# Patient Record
Sex: Male | Born: 1965
Health system: Southern US, Community
[De-identification: ages and names within clinical notes are randomized; demographics above are authoritative.]

## PROBLEM LIST (undated history)

## (undated) DIAGNOSIS — M792 Neuralgia and neuritis, unspecified: Secondary | ICD-10-CM

## (undated) DIAGNOSIS — G8929 Other chronic pain: Secondary | ICD-10-CM

## (undated) DIAGNOSIS — C801 Malignant (primary) neoplasm, unspecified: Secondary | ICD-10-CM

## (undated) DIAGNOSIS — I509 Heart failure, unspecified: Secondary | ICD-10-CM

## (undated) DIAGNOSIS — I429 Cardiomyopathy, unspecified: Secondary | ICD-10-CM

## (undated) DIAGNOSIS — M5441 Lumbago with sciatica, right side: Secondary | ICD-10-CM

## (undated) DIAGNOSIS — N189 Chronic kidney disease, unspecified: Secondary | ICD-10-CM

## (undated) DIAGNOSIS — F4310 Post-traumatic stress disorder, unspecified: Secondary | ICD-10-CM

## (undated) DIAGNOSIS — J4520 Mild intermittent asthma, uncomplicated: Secondary | ICD-10-CM

## (undated) DIAGNOSIS — I2510 Atherosclerotic heart disease of native coronary artery without angina pectoris: Secondary | ICD-10-CM

## (undated) DIAGNOSIS — Z8572 Personal history of non-Hodgkin lymphomas: Secondary | ICD-10-CM

## (undated) DIAGNOSIS — Z87828 Personal history of other (healed) physical injury and trauma: Secondary | ICD-10-CM

## (undated) DIAGNOSIS — M25561 Pain in right knee: Secondary | ICD-10-CM

## (undated) DIAGNOSIS — F1911 Other psychoactive substance abuse, in remission: Secondary | ICD-10-CM

## (undated) DIAGNOSIS — K769 Liver disease, unspecified: Secondary | ICD-10-CM

## (undated) HISTORY — DX: Atherosclerotic heart disease of native coronary artery without angina pectoris: I25.10

## (undated) HISTORY — DX: Post-traumatic stress disorder, unspecified: F43.10

## (undated) HISTORY — DX: Cardiomyopathy, unspecified: I42.9

## (undated) HISTORY — DX: Personal history of non-Hodgkin lymphomas: Z85.72

## (undated) HISTORY — DX: Mild intermittent asthma, uncomplicated: J45.20

## (undated) HISTORY — DX: Personal history of other (healed) physical injury and trauma: Z87.828

## (undated) HISTORY — DX: Neuralgia and neuritis, unspecified: M79.2

## (undated) HISTORY — DX: Other chronic pain: G89.29

## (undated) HISTORY — DX: Chronic kidney disease, unspecified: N18.9

## (undated) HISTORY — DX: Lumbago with sciatica, right side: M54.41

## (undated) HISTORY — DX: Liver disease, unspecified: K76.9

## (undated) HISTORY — DX: Malignant (primary) neoplasm, unspecified: C80.1

## (undated) HISTORY — DX: Other psychoactive substance abuse, in remission: F19.11

## (undated) HISTORY — DX: Heart failure, unspecified: I50.9

## (undated) HISTORY — PX: OTHER SURGICAL HISTORY: SHX169

## (undated) HISTORY — DX: Pain in right knee: M25.561

## (undated) SURGERY — RIGHT HEART CATH
Anesthesia: LOCAL

---

## 2017-12-24 ENCOUNTER — Ambulatory Visit: Payer: Medicare (Managed Care) | Attending: Family Medicine | Admitting: Family Medicine

## 2017-12-24 ENCOUNTER — Ambulatory Visit: Payer: Medicare (Managed Care) | Attending: Family Medicine | Admitting: Licensed Clinical Social Worker

## 2017-12-24 ENCOUNTER — Telehealth: Payer: Self-pay

## 2017-12-24 ENCOUNTER — Encounter: Payer: Self-pay | Admitting: Family Medicine

## 2017-12-24 VITALS — BP 118/86 | HR 80 | Temp 98.9°F | Resp 18 | Ht 68.0 in | Wt 202.2 lb

## 2017-12-24 DIAGNOSIS — I130 Hypertensive heart and chronic kidney disease with heart failure and stage 1 through stage 4 chronic kidney disease, or unspecified chronic kidney disease: Secondary | ICD-10-CM | POA: Insufficient documentation

## 2017-12-24 DIAGNOSIS — Z5900 Homelessness unspecified: Secondary | ICD-10-CM

## 2017-12-24 DIAGNOSIS — Z7982 Long term (current) use of aspirin: Secondary | ICD-10-CM | POA: Diagnosis not present

## 2017-12-24 DIAGNOSIS — I10 Essential (primary) hypertension: Secondary | ICD-10-CM | POA: Diagnosis not present

## 2017-12-24 DIAGNOSIS — F4312 Post-traumatic stress disorder, chronic: Secondary | ICD-10-CM | POA: Diagnosis not present

## 2017-12-24 DIAGNOSIS — I509 Heart failure, unspecified: Secondary | ICD-10-CM | POA: Diagnosis not present

## 2017-12-24 DIAGNOSIS — M545 Low back pain: Secondary | ICD-10-CM

## 2017-12-24 DIAGNOSIS — J4520 Mild intermittent asthma, uncomplicated: Secondary | ICD-10-CM | POA: Diagnosis not present

## 2017-12-24 DIAGNOSIS — G629 Polyneuropathy, unspecified: Secondary | ICD-10-CM | POA: Diagnosis not present

## 2017-12-24 DIAGNOSIS — Z79899 Other long term (current) drug therapy: Secondary | ICD-10-CM | POA: Diagnosis not present

## 2017-12-24 DIAGNOSIS — F332 Major depressive disorder, recurrent severe without psychotic features: Secondary | ICD-10-CM

## 2017-12-24 DIAGNOSIS — G8929 Other chronic pain: Secondary | ICD-10-CM | POA: Diagnosis not present

## 2017-12-24 DIAGNOSIS — I25119 Atherosclerotic heart disease of native coronary artery with unspecified angina pectoris: Secondary | ICD-10-CM | POA: Diagnosis not present

## 2017-12-24 DIAGNOSIS — I428 Other cardiomyopathies: Secondary | ICD-10-CM | POA: Insufficient documentation

## 2017-12-24 DIAGNOSIS — I25110 Atherosclerotic heart disease of native coronary artery with unstable angina pectoris: Secondary | ICD-10-CM | POA: Diagnosis present

## 2017-12-24 DIAGNOSIS — N189 Chronic kidney disease, unspecified: Secondary | ICD-10-CM | POA: Insufficient documentation

## 2017-12-24 DIAGNOSIS — F419 Anxiety disorder, unspecified: Secondary | ICD-10-CM

## 2017-12-24 DIAGNOSIS — M792 Neuralgia and neuritis, unspecified: Secondary | ICD-10-CM

## 2017-12-24 MED ORDER — NITROGLYCERIN 0.4 MG SL SUBL: 0.4000 mg | SUBLINGUAL_TABLET | SUBLINGUAL | 11 refills | Status: DC | PRN

## 2017-12-24 MED ORDER — TRIAMCINOLONE ACETONIDE 0.025 % EX CREA: 1.0000 "application " | TOPICAL_CREAM | Freq: Two times a day (BID) | CUTANEOUS | 11 refills | Status: DC

## 2017-12-24 MED ORDER — POTASSIUM CHLORIDE 20 MEQ PO PACK: 20.0000 meq | PACK | Freq: Two times a day (BID) | ORAL | 6 refills | Status: DC

## 2017-12-24 MED ORDER — ATORVASTATIN CALCIUM 80 MG PO TABS: 80.0000 mg | ORAL_TABLET | Freq: Every day | ORAL | 11 refills | Status: DC

## 2017-12-24 MED ORDER — ASPIRIN 81 MG PO TABS: 81.0000 mg | ORAL_TABLET | Freq: Every day | ORAL | 11 refills | Status: DC

## 2017-12-24 MED ORDER — METOPROLOL SUCCINATE ER 25 MG PO TB24: 25.0000 mg | ORAL_TABLET | Freq: Every day | ORAL | 6 refills | Status: DC

## 2017-12-24 MED ORDER — FUROSEMIDE 80 MG PO TABS: 80.0000 mg | ORAL_TABLET | Freq: Every day | ORAL | 6 refills | Status: DC

## 2017-12-24 MED FILL — POTASSIUM CL ER 20 MEQ TAB: 20 | 30 days supply | Qty: 60 | Fill #0

## 2017-12-24 MED FILL — NITROGLYCERIN 0.4 MG TAB SL: 0.4 | 10 days supply | Qty: 25 | Fill #0

## 2017-12-24 MED FILL — ATORVASTATIN 80 MG TABLET: 80 | 30 days supply | Qty: 30 | Fill #0

## 2017-12-24 MED FILL — FUROSEMIDE 80 MG TABS: 80 | 30 days supply | Qty: 30 | Fill #0

## 2017-12-24 MED FILL — METOPROLOL SUCCINATE ER 25: 25 | 30 days supply | Qty: 30 | Fill #0
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
## 2017-12-24 NOTE — Telephone Encounter (Signed)
Patient was called, no answer. CMA lvm stating that the patients letter was ready for pick up.

## 2017-12-24 NOTE — Patient Instructions (Signed)
Asthma, Adult Asthma is a condition of the lungs in which the airways tighten and narrow. Asthma can make it hard to breathe. Asthma cannot be cured, but medicine and lifestyle changes can help control it. Asthma may be started (triggered) by:  Animal skin flakes (dander).  Dust.  Cockroaches.  Pollen.  Mold.  Smoke.  Cleaning products.  Hair sprays or aerosol sprays.  Paint fumes or strong smells.  Cold air, weather changes, and winds.  Crying or laughing hard.  Stress.  Certain medicines or drugs.  Foods, such as dried fruit, potato chips, and sparkling grape juice.  Infections or conditions (colds, flu).  Exercise.  Certain medical conditions or diseases.  Exercise or tiring activities.  Follow these instructions at home:  Take medicine as told by your doctor.  Use a peak flow meter as told by your doctor. A peak flow meter is a tool that measures how well the lungs are working.  Record and keep track of the peak flow meter's readings.  Understand and use the asthma action plan. An asthma action plan is a written plan for taking care of your asthma and treating your attacks.  To help prevent asthma attacks: ? Do not smoke. Stay away from secondhand smoke. ? Change your heating and air conditioning filter often. ? Limit your use of fireplaces and wood stoves. ? Get rid of pests (such as roaches and mice) and their droppings. ? Throw away plants if you see mold on them. ? Clean your floors. Dust regularly. Use cleaning products that do not smell. ? Have someone vacuum when you are not home. Use a vacuum cleaner with a HEPA filter if possible. ? Replace carpet with wood, tile, or vinyl flooring. Carpet can trap animal skin flakes and dust. ? Use allergy-proof pillows, mattress covers, and box spring covers. ? Wash bed sheets and blankets every week in hot water and dry them in a dryer. ? Use blankets that are made of polyester or cotton. ? Clean bathrooms  and kitchens with bleach. If possible, have someone repaint the walls in these rooms with mold-resistant paint. Keep out of the rooms that are being cleaned and painted. ? Wash hands often. Contact a doctor if:  You have make a whistling sound when breaking (wheeze), have shortness of breath, or have a cough even if taking medicine to prevent attacks.  The colored mucus you cough up (sputum) is thicker than usual.  The colored mucus you cough up changes from clear or white to yellow, green, gray, or bloody.  You have problems from the medicine you are taking such as: ? A rash. ? Itching. ? Swelling. ? Trouble breathing.  You need reliever medicines more than 2-3 times a week.  Your peak flow measurement is still at 50-79% of your personal best after following the action plan for 1 hour.  You have a fever. Get help right away if:  You seem to be worse and are not responding to medicine during an asthma attack.  You are short of breath even at rest.  You get short of breath when doing very little activity.  You have trouble eating, drinking, or talking.  You have chest pain.  You have a fast heartbeat.  Your lips or fingernails start to turn blue.  You are light-headed, dizzy, or faint.  Your peak flow is less than 50% of your personal best. This information is not intended to replace advice given to you by your health care provider. Make   sure you discuss any questions you have with your health care provider. Document Released: 10/08/2007 Document Revised: 09/27/2015 Document Reviewed: 11/18/2012 Elsevier Interactive Patient Education  2017 [employer]  Coronary Artery Disease, Male Coronary artery disease (CAD) is a condition in which the arteries that lead to the heart (coronary arteries) become narrow or blocked. The narrowing or blockage can lead to decreased blood flow to the heart. Prolonged reduced blood flow can cause a heart attack (myocardial infarction or MI).  This condition may also be called coronary heart disease. Because CAD is the leading cause of death in men, it is important to understand what causes this condition and how it is treated. What are the causes? CAD is most often caused by atherosclerosis. This is the buildup of fat and cholesterol (plaque) on the inside of the arteries. Over time, the plaque may narrow or block the artery, reducing blood flow to the heart. Plaque can also become weak and break off within a coronary artery and cause a sudden blockage. Other less common causes of CAD include:  An embolism or blood clot in a coronary artery.  A tearing of the artery (spontaneous coronary artery dissection).  An aneurysm.  Inflammation (vasculitis) in the artery wall.  What increases the risk? The following factors may make you more likely to develop this condition:  Age. Men over age 33 are at a greater risk of CAD.  Family history of CAD.  Gender. Men often develop CAD earlier in life than women.  High blood pressure (hypertension).  Diabetes.  High cholesterol levels.  Tobacco use.  Excessive alcohol use.  Lack of exercise.  A diet high in saturated and trans fats, such as fried food and processed meat.  Other possible risk factors include:  High stress levels.  Depression.  Obesity.  Sleep apnea.  What are the signs or symptoms? Many people do not have any symptoms during the early stages of CAD. As the condition progresses, symptoms may include:  Chest pain (angina). The pain can: ? Feel like a crushing or squeezing, or a tightness, pressure, fullness, or heaviness in the chest. ? Last more than a few minutes or can stop and recur. The pain tends to get worse with exercise or stress and to fade with rest.  Pain in the arms, neck, jaw, or back.  Unexplained heartburn or indigestion.  Shortness of breath.  Nausea or vomiting.  Sudden light-headedness.  Sudden cold sweats.  Fluttering or  fast heartbeat (palpitations).  How is this diagnosed? This condition is diagnosed based on:  Your family and medical history.  A physical exam.  Tests, including: ? A test to check the electrical signals in your heart (electrocardiogram). ? Exercise stress test. This looks for signs of blockage when the heart is stressed with exercise, such as running on a treadmill. ? Pharmacologic stress test. This test looks for signs of blockage when the heart is being stressed with a medicine. ? Blood tests. ? Coronary angiogram. This is a procedure to look at the coronary arteries to see if there is any blockage. During this test, a dye is injected into your arteries so they appear on an X-ray. ? A test that uses sound waves to take a picture of your heart (echocardiogram). ? Chest X-ray.  How is this treated? This condition may be treated by:  Healthy lifestyle changes to reduce risk factors.  Medicines such as: ? Antiplatelet medicines and blood-thinning medicines, such as aspirin. These help to prevent blood  clots. ? Nitroglycerin. ? Blood pressure medicines. ? Cholesterol-lowering medicine.  Coronary angioplasty and stenting. During this procedure, a thin, flexible tube is inserted through a blood vessel and into a blocked artery. A balloon or similar device on the end of the tube is inflated to open up the artery. In some cases, a small, mesh tube (stent) is inserted into the artery to keep it open.  Coronary artery bypass surgery. During this surgery, veins or arteries from other parts of the body are used to create a bypass around the blockage and allow blood to reach your heart.  Follow these instructions at home: Medicines  Take over-the-counter and prescription medicines only as told by your health care provider.  Do not take the following medicines unless your health care provider approves: ? NSAIDs, such as ibuprofen, naproxen, or celecoxib. ? Vitamin supplements that  contain vitamin A, vitamin E, or both. Lifestyle  Follow an exercise program approved by your health care provider. Aim for 150 minutes of moderate exercise or 75 minutes of vigorous exercise each week.  Maintain a healthy weight or lose weight as approved by your health care provider.  Rest when you are tired.  Learn to manage stress or try to limit your stress. Ask your health care provider for suggestions if you need help.  Get screened for depression and seek treatment, if needed.  Do not use any products that contain nicotine or tobacco, such as cigarettes and e-cigarettes. If you need help quitting, ask your health care provider.  Do not use illegal drugs. Eating and drinking  Follow a heart-healthy diet. A dietitian can help educate you about healthy food options and changes. In general, eat plenty of fruits and vegetables, lean meats, and whole grains.  Avoid foods high in: ? Sugar. ? Salt (sodium). ? Saturated fat, such as processed or fatty meat. ? Trans fat, such as fried foods.  Use healthy cooking methods such as roasting, grilling, broiling, baking, poaching, steaming, or stir-frying.  If you drink alcohol, and your health care provider approves, limit your alcohol intake to no more than 2 drinks per day. One drink equals 12 ounces of beer, 5 ounces of wine, or 1 ounces of hard liquor. General instructions  Manage any other health conditions, such as hypertension and diabetes. These conditions affect your heart.  Your health care provider may ask you to monitor your blood pressure. Ideally, your blood pressure should be below 130/80.  Keep all follow-up visits as told by your health care provider. This is important. Get help right away if:  You have pain in your chest, neck, arm, jaw, stomach, or back that: ? Lasts more than a few minutes. ? Is recurring. ? Is not relieved by taking medicine under your tongue (sublingualnitroglycerin).  You have too much  (profuse) sweating without cause.  You have unexplained: ? Heartburn or indigestion. ? Shortness of breath or difficulty breathing. ? Fluttering or fast heartbeat (palpitations). ? Nausea or vomiting. ? Fatigue. ? Feelings of nervousness or anxiety. ? Weakness. ? Diarrhea.  You have sudden light-headedness or dizziness.  You faint.  You feel like hurting yourself or think about taking your own life. These symptoms may represent a serious problem that is an emergency. Do not wait to see if the symptoms will go away. Get medical help right away. Call your local emergency services (911 in the [place] Do not drive yourself to the hospital. Summary  Coronary artery disease (CAD) is a process in which the  arteries that lead to the heart (coronary arteries) become narrow or blocked. The narrowing or blockage can lead to a heart attack.  Many people do not have any symptoms during the early stages of CAD. This is called "silent CAD."  CAD can be treated with lifestyle changes, medicines, surgery, or a combination of these treatments. This information is not intended to replace advice given to you by your health care provider. Make sure you discuss any questions you have with your health care provider. Document Released: 11/16/2013 Document Revised: 04/11/2016 Document Reviewed: 04/11/2016 Elsevier Interactive Patient Education  [employer].

## 2017-12-25 ENCOUNTER — Telehealth: Payer: Self-pay

## 2017-12-25 LAB — CBC WITH DIFFERENTIAL/PLATELET
Basophils Absolute: 0.1 x10E3/uL (ref 0.0–0.2)
Basos: 1 %
EOS (ABSOLUTE): 0.3 x10E3/uL (ref 0.0–0.4)
Eos: 5 %
Hematocrit: 47.1 % (ref 37.5–51.0)
Hemoglobin: 15.1 g/dL (ref 13.0–17.7)
Immature Grans (Abs): 0 x10E3/uL (ref 0.0–0.1)
Immature Granulocytes: 0 %
Lymphocytes Absolute: 2.5 x10E3/uL (ref 0.7–3.1)
Lymphs: 46 %
MCH: 29.6 pg (ref 26.6–33.0)
MCHC: 32.1 g/dL (ref 31.5–35.7)
MCV: 92 fL (ref 79–97)
Monocytes Absolute: 0.4 x10E3/uL (ref 0.1–0.9)
Monocytes: 7 %
Neutrophils Absolute: 2.3 x10E3/uL (ref 1.4–7.0)
Neutrophils: 41 %
Platelets: 262 x10E3/uL (ref 150–450)
RBC: 5.1 x10E6/uL (ref 4.14–5.80)
RDW: 14.3 % (ref 12.3–15.4)
WBC: 5.6 x10E3/uL (ref 3.4–10.8)

## 2017-12-25 LAB — COMPREHENSIVE METABOLIC PANEL WITH GFR
ALT: 11 IU/L (ref 0–44)
AST: 17 IU/L (ref 0–40)
Albumin/Globulin Ratio: 1.5 (ref 1.2–2.2)
Albumin: 4.7 g/dL (ref 3.5–5.5)
Alkaline Phosphatase: 80 IU/L (ref 39–117)
BUN/Creatinine Ratio: 10 (ref 9–20)
BUN: 12 mg/dL (ref 6–24)
Bilirubin Total: 1 mg/dL (ref 0.0–1.2)
CO2: 21 mmol/L (ref 20–29)
Calcium: 10 mg/dL (ref 8.7–10.2)
Chloride: 104 mmol/L (ref 96–106)
Creatinine, Ser: 1.25 mg/dL (ref 0.76–1.27)
GFR calc Af Amer: 76 mL/min/1.73
GFR calc non Af Amer: 65 mL/min/1.73
Globulin, Total: 3.1 g/dL (ref 1.5–4.5)
Glucose: 83 mg/dL (ref 65–99)
Potassium: 4 mmol/L (ref 3.5–5.2)
Sodium: 142 mmol/L (ref 134–144)
Total Protein: 7.8 g/dL (ref 6.0–8.5)

## 2017-12-25 LAB — LIPID PANEL
Chol/HDL Ratio: 2.4 ratio (ref 0.0–5.0)
Cholesterol, Total: 155 mg/dL (ref 100–199)
HDL: 64 mg/dL
LDL Calculated: 80 mg/dL (ref 0–99)
Triglycerides: 53 mg/dL (ref 0–149)
VLDL Cholesterol Cal: 11 mg/dL (ref 5–40)

## 2017-12-25 NOTE — Telephone Encounter (Signed)
Patient was called to be informed of his most recent lab results and no answer. If the patient calls back please notify the patient  Labs were normal but since he has had a past heart attack, he still needs to take his cholesterol medication daily and a daily aspirin.

## 2017-12-25 NOTE — BH Specialist Note (Signed)
Integrated Behavioral Health Initial Visit  MRN: [id number] Name: Cody Guzman  Number of [place] Clinician visits:: 1/6 Session Start time: 11:10 AM  Session End time: 11:40 AM Total time: 30 minutes  Type of Service: [place] Interpretor:No. Interpretor Name and Language: N/A   Warm Hand Off Completed.       SUBJECTIVE: Cody Guzman is a 52 y.o. male accompanied by self Patient was referred by [name] for depression, anxiety, and community resources. Patient reports the following symptoms/concerns: feelings of sadness and worry, difficulty sleeping, low energy, irritability, panic attacks, feeling bad about self, food insecurity, decreased concentration, restlessness, and hx of suicidal ideations Duration of problem: Ongoing; Severity of problem: severe  OBJECTIVE: Mood: Anxious and Affect: Appropriate Risk of harm to self or others: No plan to harm self or others Pt scored positive on PHQ-9; however, denies current SI/HI/AVH. Protective factors were identified, safety plan was discussed, and crisis intervention resources were provided  LIFE CONTEXT: Family and Social: Pt receives support from spouse School/Work: Pt receives Fish farm manager Self-Care: Pt has hx of substance use (crack cocaine) and is participating in a substance use treatment program that assists with housing Life Changes: Pt is homeless and has hx of substance use  GOALS ADDRESSED: Patient will: 1. Reduce symptoms of: anxiety, depression and stress 2. Increase knowledge and/or ability of: coping skills and healthy habits  3. Demonstrate ability to: Increase healthy adjustment to current life circumstances, Increase adequate support systems for patient/family and Decrease self-medicating behaviors  INTERVENTIONS: Interventions utilized: Solution-Focused Strategies, Supportive Counseling, Psychoeducation and/or Health Education and Link  to [employer]  Standardized Assessments completed: GAD-7 and PHQ 2&9 with C-SSRS  ASSESSMENT: Patient currently experiencing depression and anxiety triggered by ongoing substance use and psychosocial stressors. He reports feelings of sadness and worry, difficulty sleeping, low energy, irritability, panic attacks, feeling bad about self, food insecurity, decreased concentration, restlessness, and hx of suicidal ideations. Pt scored positive on PHQ-9; however, denies current SI/HI/AVH. Protective factors were identified, safety plan was discussed, and crisis intervention resources were provided   Patient may benefit from psychoeducation and community resources. LCSWA educated pt on the correlation between one's physical and mental health, in addition, to how stress can negatively impact health. Healthy coping skills were discussed to manage stressors and decrease symptoms. Supportive resources for transportation and food insecurity was provided. Additional resources for the [facility] and [employer] was given.   PLAN: 1. Follow up with behavioral health clinician on : Pt was encouraged to contact LCSWA if symptoms worsen or fail to improve to schedule behavioral appointments at [facility]. 2. Behavioral recommendations: LCSWA recommends that pt apply healthy coping skills discussed and utilize provided resources. Pt is encouraged to schedule follow up appointment with LCSWA 3. Referral(s): [place] (In Clinic), [place] (LME/Outside Clinic) and [employer] Resources:  Haematologist, Housing and Transportation 4. "From scale of 1-10, how likely are you to follow plan?":   Rebekah Chesterfield, LCSW 12/25/17 4:48 PM

## 2017-12-28 ENCOUNTER — Telehealth: Payer: Self-pay

## 2017-12-28 NOTE — Telephone Encounter (Signed)
Attempted again to contact the patient to inform him that he needs to complete Part A of the SCAT application.  Calls placed to # [phone] and [phone] and the message for both numbers stated that the call could not be completed at this time.

## 2017-12-28 NOTE — Telephone Encounter (Signed)
Attempted to contact the patient to inform him that he needs to complete Part A of the SCAT application as only part of it is completed.  Calls placed to # [phone] and [phone] and the message for both numbers stated that the call could not be completed at this time.  Part B of SCAT application faxed to SCAT eligibility.

## 2017-12-29 ENCOUNTER — Telehealth: Payer: Self-pay

## 2017-12-29 NOTE — Telephone Encounter (Signed)
Attempted again to contact the patient to inform him that he needs to complete Part A of the SCAT application. Calls placed to # [phone] and [phone] and the message for both numbers stated that the call could not be completed at this time. Call also placed to the significant other noted , Caesar Chestnut # [phone] and the number also noted that the call could not be completed at this time

## 2018-01-07 ENCOUNTER — Telehealth: Payer: Self-pay

## 2018-01-07 NOTE — Telephone Encounter (Signed)
Attempted again to contact the patient to inform him that he needs to complete Part A of the SCAT application. Calls placed to # [phone] and [phone] and the person who answered stated that the patient can be reached at # [phone].  Call was then placed to that number and a message was left requesting the patient contact this CM at # [phone]

## 2018-01-07 NOTE — Telephone Encounter (Signed)
Call received from the patient. Explained to him that he needs to complete Part A of SCAT application.  He stated that SCAT already sent him a copy of Part A to complete and he will send back to SCAT when he is finished. Informed him that Part B has already been faxed to SCAT

## 2018-01-22 ENCOUNTER — Ambulatory Visit: Payer: Medicare (Managed Care) | Attending: Family Medicine | Admitting: Family Medicine

## 2018-01-22 ENCOUNTER — Encounter: Payer: Self-pay | Admitting: Family Medicine

## 2018-01-22 VITALS — BP 99/65 | HR 75 | Temp 97.9°F | Ht 68.0 in | Wt 214.0 lb

## 2018-01-22 DIAGNOSIS — J4520 Mild intermittent asthma, uncomplicated: Secondary | ICD-10-CM | POA: Insufficient documentation

## 2018-01-22 DIAGNOSIS — F32A Depression, unspecified: Secondary | ICD-10-CM | POA: Insufficient documentation

## 2018-01-22 DIAGNOSIS — F4310 Post-traumatic stress disorder, unspecified: Secondary | ICD-10-CM | POA: Insufficient documentation

## 2018-01-22 DIAGNOSIS — I429 Cardiomyopathy, unspecified: Secondary | ICD-10-CM | POA: Insufficient documentation

## 2018-01-22 DIAGNOSIS — N182 Chronic kidney disease, stage 2 (mild): Secondary | ICD-10-CM | POA: Insufficient documentation

## 2018-01-22 DIAGNOSIS — Z87828 Personal history of other (healed) physical injury and trauma: Secondary | ICD-10-CM | POA: Insufficient documentation

## 2018-01-22 DIAGNOSIS — G8921 Chronic pain due to trauma: Secondary | ICD-10-CM | POA: Diagnosis not present

## 2018-01-22 DIAGNOSIS — M545 Low back pain, unspecified: Secondary | ICD-10-CM | POA: Insufficient documentation

## 2018-01-22 DIAGNOSIS — G43101 Migraine with aura, not intractable, with status migrainosus: Secondary | ICD-10-CM

## 2018-01-22 DIAGNOSIS — I509 Heart failure, unspecified: Secondary | ICD-10-CM | POA: Insufficient documentation

## 2018-01-22 DIAGNOSIS — M25561 Pain in right knee: Secondary | ICD-10-CM | POA: Insufficient documentation

## 2018-01-22 DIAGNOSIS — M25551 Pain in right hip: Secondary | ICD-10-CM | POA: Insufficient documentation

## 2018-01-22 DIAGNOSIS — G8929 Other chronic pain: Secondary | ICD-10-CM

## 2018-01-22 DIAGNOSIS — F329 Major depressive disorder, single episode, unspecified: Secondary | ICD-10-CM | POA: Insufficient documentation

## 2018-01-22 DIAGNOSIS — M792 Neuralgia and neuritis, unspecified: Secondary | ICD-10-CM | POA: Diagnosis not present

## 2018-01-22 DIAGNOSIS — I2510 Atherosclerotic heart disease of native coronary artery without angina pectoris: Secondary | ICD-10-CM | POA: Insufficient documentation

## 2018-01-22 DIAGNOSIS — Z8572 Personal history of non-Hodgkin lymphomas: Secondary | ICD-10-CM | POA: Insufficient documentation

## 2018-01-22 MED ORDER — SUMATRIPTAN SUCCINATE 25 MG PO TABS: ORAL_TABLET | ORAL | 0 refills | Status: DC

## 2018-01-22 MED ORDER — TRAMADOL HCL 50 MG PO TABS: 50.0000 mg | ORAL_TABLET | Freq: Two times a day (BID) | ORAL | 0 refills | Status: AC | PRN

## 2018-01-22 MED FILL — TRIAMCINOLONE 0.025% CREAM: 0.025 | 15 days supply | Qty: 30 | Fill #0
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
## 2018-01-22 NOTE — Progress Notes (Signed)
Patient has questions about medications.  Patient is also having hip, shoulder, back and foot pain.

## 2018-02-19 ENCOUNTER — Ambulatory Visit: Payer: Medicare (Managed Care) | Admitting: Family Medicine

## 2018-03-04 MED FILL — ATORVASTATIN 80 MG TABLET: 80 | 30 days supply | Qty: 30 | Fill #1

## 2018-03-04 MED FILL — FUROSEMIDE 80 MG TABS: 80 | 30 days supply | Qty: 30 | Fill #1

## 2018-03-15 ENCOUNTER — Other Ambulatory Visit: Payer: Self-pay

## 2018-03-15 ENCOUNTER — Encounter: Payer: Self-pay | Admitting: Family Medicine

## 2018-03-15 ENCOUNTER — Encounter (INDEPENDENT_AMBULATORY_CARE_PROVIDER_SITE_OTHER): Payer: Self-pay

## 2018-03-15 ENCOUNTER — Ambulatory Visit: Payer: Medicare HMO | Attending: Family Medicine | Admitting: Family Medicine

## 2018-03-15 VITALS — BP 119/83 | HR 83 | Temp 98.5°F | Resp 20 | Wt 218.0 lb

## 2018-03-15 DIAGNOSIS — I110 Hypertensive heart disease with heart failure: Secondary | ICD-10-CM | POA: Insufficient documentation

## 2018-03-15 DIAGNOSIS — G43909 Migraine, unspecified, not intractable, without status migrainosus: Secondary | ICD-10-CM | POA: Diagnosis not present

## 2018-03-15 DIAGNOSIS — I509 Heart failure, unspecified: Secondary | ICD-10-CM | POA: Diagnosis not present

## 2018-03-15 DIAGNOSIS — I2510 Atherosclerotic heart disease of native coronary artery without angina pectoris: Secondary | ICD-10-CM | POA: Diagnosis not present

## 2018-03-15 DIAGNOSIS — I25119 Atherosclerotic heart disease of native coronary artery with unspecified angina pectoris: Secondary | ICD-10-CM | POA: Diagnosis not present

## 2018-03-15 DIAGNOSIS — R0789 Other chest pain: Secondary | ICD-10-CM | POA: Insufficient documentation

## 2018-03-15 DIAGNOSIS — R079 Chest pain, unspecified: Secondary | ICD-10-CM

## 2018-03-15 DIAGNOSIS — I429 Cardiomyopathy, unspecified: Secondary | ICD-10-CM | POA: Diagnosis not present

## 2018-03-15 DIAGNOSIS — M792 Neuralgia and neuritis, unspecified: Secondary | ICD-10-CM | POA: Diagnosis not present

## 2018-03-15 DIAGNOSIS — F4310 Post-traumatic stress disorder, unspecified: Secondary | ICD-10-CM | POA: Insufficient documentation

## 2018-03-15 DIAGNOSIS — I10 Essential (primary) hypertension: Secondary | ICD-10-CM

## 2018-03-15 DIAGNOSIS — G629 Polyneuropathy, unspecified: Secondary | ICD-10-CM | POA: Insufficient documentation

## 2018-03-15 DIAGNOSIS — R399 Unspecified symptoms and signs involving the genitourinary system: Secondary | ICD-10-CM

## 2018-03-15 DIAGNOSIS — R0609 Other forms of dyspnea: Secondary | ICD-10-CM

## 2018-03-15 DIAGNOSIS — G8929 Other chronic pain: Secondary | ICD-10-CM | POA: Diagnosis not present

## 2018-03-15 DIAGNOSIS — R350 Frequency of micturition: Secondary | ICD-10-CM | POA: Insufficient documentation

## 2018-03-15 DIAGNOSIS — M545 Low back pain: Secondary | ICD-10-CM | POA: Insufficient documentation

## 2018-03-15 DIAGNOSIS — F17210 Nicotine dependence, cigarettes, uncomplicated: Secondary | ICD-10-CM | POA: Insufficient documentation

## 2018-03-15 LAB — POCT URINALYSIS DIP (CLINITEK)
Bilirubin, UA: NEGATIVE
Blood, UA: NEGATIVE
Glucose, UA: NEGATIVE mg/dL
Ketones, POC UA: NEGATIVE mg/dL
Leukocytes, UA: NEGATIVE
Nitrite, UA: NEGATIVE
POC,PROTEIN,UA: NEGATIVE
Spec Grav, UA: 1.015
Urobilinogen, UA: NEGATIVE U/dL — AB
pH, UA: 7

## 2018-03-15 MED ORDER — TRAMADOL HCL 50 MG PO TABS: 50.0000 mg | ORAL_TABLET | Freq: Two times a day (BID) | ORAL | 0 refills | Status: DC | PRN

## 2018-03-15 MED FILL — traMADol HCL 50 MG TABS: 50 | 7 days supply | Qty: 14 | Fill #0
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
## 2018-03-15 NOTE — Progress Notes (Signed)
Follow up: SOB/ chest tightness Needs Lithium. Short tempered  Difficulty walking d/t SOB- DOE  Difficulty with urinating  Unable to afford Kenolog cream

## 2018-03-16 LAB — BASIC METABOLIC PANEL WITH GFR
BUN/Creatinine Ratio: 12 (ref 9–20)
BUN: 15 mg/dL (ref 6–24)
CO2: 22 mmol/L (ref 20–29)
Calcium: 9.1 mg/dL (ref 8.7–10.2)
Chloride: 102 mmol/L (ref 96–106)
Creatinine, Ser: 1.24 mg/dL (ref 0.76–1.27)
GFR calc Af Amer: 76 mL/min/1.73
GFR calc non Af Amer: 65 mL/min/1.73
Glucose: 85 mg/dL (ref 65–99)
Potassium: 3.8 mmol/L (ref 3.5–5.2)
Sodium: 141 mmol/L (ref 134–144)

## 2018-04-08 MED FILL — FUROSEMIDE 80 MG TAB: 80 | 30 days supply | Qty: 30 | Fill #2

## 2018-04-08 MED FILL — traMADol HCL 50 MG TABS: 50 | 7 days supply | Qty: 14 | Fill #1

## 2018-04-26 ENCOUNTER — Ambulatory Visit: Payer: Medicare (Managed Care) | Admitting: Family Medicine

## 2018-05-25 MED FILL — POTASSIUM CL ER 20 MEQ TAB: 20 | 30 days supply | Qty: 60 | Fill #1

## 2018-05-25 MED FILL — FUROSEMIDE 80 MG TAB: 80 | 30 days supply | Qty: 30 | Fill #3

## 2018-05-25 MED FILL — ATORVASTATIN 80 MG TABLET: 80 | 30 days supply | Qty: 30 | Fill #2

## 2018-05-25 MED FILL — METOPROLOL SUCCINATE ER 25: 25 | 30 days supply | Qty: 30 | Fill #1

## 2018-06-14 DIAGNOSIS — Z5181 Encounter for therapeutic drug level monitoring: Secondary | ICD-10-CM | POA: Diagnosis not present

## 2018-06-23 ENCOUNTER — Telehealth: Payer: Self-pay | Admitting: Family Medicine
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
## 2018-06-23 NOTE — Telephone Encounter (Signed)
Patient needs a FL2 by tomorrow sent to [place] for housing

## 2018-06-23 NOTE — Telephone Encounter (Signed)
Should he experience repeat symptoms he would need to go to the ED to be evaluated for stroke.

## 2018-06-24 NOTE — Telephone Encounter (Signed)
Attempted again to contact [facility] caseworker - [place] Main # [phone] to confirm need for [facility], obtain fax number and inform him that patient's PCP is not in the office until tomorrow, 06/25/2018 afternoon.  Message left requesting a call back to this CM # [phone].

## 2018-06-24 NOTE — Telephone Encounter (Signed)
Patient is on the schedule for 06/25/18 at the end of the day so maybe he will have some info or contact information

## 2018-06-28 ENCOUNTER — Telehealth: Payer: Self-pay

## 2018-06-28 NOTE — Telephone Encounter (Signed)
Call received from [place] Main, [place]. He explains that [employer] the Transitions to [employer] ( [employer])  [facility] is needed to initiate this referral process to the adult care home.  This is not housing program but a screening process.  The patient will still need to meet eligibility requirements.  The FL2 can be faxed to him at # [phone].   Mr Zigmund Daniel email - [email or web address] Ricker

## 2018-06-28 NOTE — Telephone Encounter (Signed)
FL2 faxed to [facility] as requested.

## 2018-06-29 ENCOUNTER — Telehealth: Payer: Self-pay | Admitting: Licensed Clinical Social Worker

## 2018-06-29 NOTE — Telephone Encounter (Signed)
He would need to provide paperwork regarding the initial certification for his service animal

## 2018-06-29 NOTE — Telephone Encounter (Signed)
Call placed to patient. Patient was informed that completed FL2 was faxed to [facility], [place] [address].   Pt reports that he needs a letter from PCP to re-certify his service animal, services were initiated in 2017. Please follow up.

## 2018-07-02 ENCOUNTER — Emergency Department (HOSPITAL_COMMUNITY): Payer: Medicare HMO

## 2018-07-02 ENCOUNTER — Emergency Department (HOSPITAL_COMMUNITY)
Admission: EM | Admit: 2018-07-02 | Discharge: 2018-07-03 | Disposition: A | Discharge status: Home / Self Care | Payer: Medicare HMO | Source: Home / Self Care | Attending: Emergency Medicine | Admitting: Emergency Medicine

## 2018-07-02 ENCOUNTER — Encounter (HOSPITAL_COMMUNITY): Payer: Self-pay | Admitting: Emergency Medicine

## 2018-07-02 ENCOUNTER — Telehealth: Payer: Self-pay | Admitting: Family Medicine

## 2018-07-02 ENCOUNTER — Other Ambulatory Visit: Payer: Self-pay

## 2018-07-02 DIAGNOSIS — R079 Chest pain, unspecified: Secondary | ICD-10-CM

## 2018-07-02 DIAGNOSIS — I429 Cardiomyopathy, unspecified: Secondary | ICD-10-CM | POA: Diagnosis present

## 2018-07-02 DIAGNOSIS — Z79899 Other long term (current) drug therapy: Secondary | ICD-10-CM

## 2018-07-02 DIAGNOSIS — G8929 Other chronic pain: Secondary | ICD-10-CM | POA: Diagnosis present

## 2018-07-02 DIAGNOSIS — F17210 Nicotine dependence, cigarettes, uncomplicated: Secondary | ICD-10-CM | POA: Insufficient documentation

## 2018-07-02 DIAGNOSIS — N182 Chronic kidney disease, stage 2 (mild): Secondary | ICD-10-CM | POA: Diagnosis not present

## 2018-07-02 DIAGNOSIS — R7989 Other specified abnormal findings of blood chemistry: Secondary | ICD-10-CM | POA: Diagnosis not present

## 2018-07-02 DIAGNOSIS — N179 Acute kidney failure, unspecified: Secondary | ICD-10-CM | POA: Diagnosis not present

## 2018-07-02 DIAGNOSIS — G43909 Migraine, unspecified, not intractable, without status migrainosus: Secondary | ICD-10-CM | POA: Diagnosis present

## 2018-07-02 DIAGNOSIS — I509 Heart failure, unspecified: Secondary | ICD-10-CM | POA: Insufficient documentation

## 2018-07-02 DIAGNOSIS — I25119 Atherosclerotic heart disease of native coronary artery with unspecified angina pectoris: Secondary | ICD-10-CM | POA: Diagnosis not present

## 2018-07-02 DIAGNOSIS — G629 Polyneuropathy, unspecified: Secondary | ICD-10-CM | POA: Diagnosis present

## 2018-07-02 DIAGNOSIS — I5023 Acute on chronic systolic (congestive) heart failure: Secondary | ICD-10-CM | POA: Diagnosis not present

## 2018-07-02 DIAGNOSIS — K760 Fatty (change of) liver, not elsewhere classified: Secondary | ICD-10-CM | POA: Diagnosis not present

## 2018-07-02 DIAGNOSIS — Z9119 Patient's noncompliance with other medical treatment and regimen: Secondary | ICD-10-CM | POA: Diagnosis not present

## 2018-07-02 DIAGNOSIS — E041 Nontoxic single thyroid nodule: Secondary | ICD-10-CM | POA: Diagnosis present

## 2018-07-02 DIAGNOSIS — I2510 Atherosclerotic heart disease of native coronary artery without angina pectoris: Secondary | ICD-10-CM | POA: Insufficient documentation

## 2018-07-02 DIAGNOSIS — Z7982 Long term (current) use of aspirin: Secondary | ICD-10-CM

## 2018-07-02 DIAGNOSIS — I160 Hypertensive urgency: Secondary | ICD-10-CM | POA: Diagnosis not present

## 2018-07-02 DIAGNOSIS — J4520 Mild intermittent asthma, uncomplicated: Secondary | ICD-10-CM | POA: Diagnosis not present

## 2018-07-02 DIAGNOSIS — E279 Disorder of adrenal gland, unspecified: Secondary | ICD-10-CM | POA: Diagnosis not present

## 2018-07-02 DIAGNOSIS — J45909 Unspecified asthma, uncomplicated: Secondary | ICD-10-CM

## 2018-07-02 DIAGNOSIS — Z8572 Personal history of non-Hodgkin lymphomas: Secondary | ICD-10-CM

## 2018-07-02 DIAGNOSIS — I081 Rheumatic disorders of both mitral and tricuspid valves: Secondary | ICD-10-CM | POA: Diagnosis present

## 2018-07-02 DIAGNOSIS — I340 Nonrheumatic mitral (valve) insufficiency: Secondary | ICD-10-CM | POA: Diagnosis not present

## 2018-07-02 DIAGNOSIS — E785 Hyperlipidemia, unspecified: Secondary | ICD-10-CM | POA: Diagnosis not present

## 2018-07-02 DIAGNOSIS — I248 Other forms of acute ischemic heart disease: Secondary | ICD-10-CM | POA: Diagnosis not present

## 2018-07-02 DIAGNOSIS — T502X5A Adverse effect of carbonic-anhydrase inhibitors, benzothiadiazides and other diuretics, initial encounter: Secondary | ICD-10-CM | POA: Diagnosis not present

## 2018-07-02 DIAGNOSIS — N189 Chronic kidney disease, unspecified: Secondary | ICD-10-CM

## 2018-07-02 DIAGNOSIS — I130 Hypertensive heart and chronic kidney disease with heart failure and stage 1 through stage 4 chronic kidney disease, or unspecified chronic kidney disease: Secondary | ICD-10-CM | POA: Diagnosis not present

## 2018-07-02 DIAGNOSIS — I5082 Biventricular heart failure: Secondary | ICD-10-CM | POA: Diagnosis not present

## 2018-07-02 DIAGNOSIS — E876 Hypokalemia: Secondary | ICD-10-CM | POA: Diagnosis not present

## 2018-07-02 DIAGNOSIS — R0989 Other specified symptoms and signs involving the circulatory and respiratory systems: Secondary | ICD-10-CM | POA: Diagnosis not present

## 2018-07-02 DIAGNOSIS — R9431 Abnormal electrocardiogram [ECG] [EKG]: Secondary | ICD-10-CM | POA: Diagnosis not present

## 2018-07-02 DIAGNOSIS — I5043 Acute on chronic combined systolic (congestive) and diastolic (congestive) heart failure: Secondary | ICD-10-CM | POA: Diagnosis not present

## 2018-07-02 DIAGNOSIS — M79601 Pain in right arm: Secondary | ICD-10-CM | POA: Diagnosis present

## 2018-07-02 DIAGNOSIS — R10811 Right upper quadrant abdominal tenderness: Secondary | ICD-10-CM | POA: Diagnosis not present

## 2018-07-02 DIAGNOSIS — R911 Solitary pulmonary nodule: Secondary | ICD-10-CM | POA: Diagnosis not present

## 2018-07-02 DIAGNOSIS — K5900 Constipation, unspecified: Secondary | ICD-10-CM | POA: Diagnosis not present

## 2018-07-02 DIAGNOSIS — R0789 Other chest pain: Secondary | ICD-10-CM | POA: Diagnosis not present

## 2018-07-02 DIAGNOSIS — R1011 Right upper quadrant pain: Secondary | ICD-10-CM | POA: Diagnosis not present

## 2018-07-02 DIAGNOSIS — K828 Other specified diseases of gallbladder: Secondary | ICD-10-CM | POA: Diagnosis not present

## 2018-07-02 DIAGNOSIS — R0602 Shortness of breath: Secondary | ICD-10-CM | POA: Diagnosis present

## 2018-07-02 DIAGNOSIS — M792 Neuralgia and neuritis, unspecified: Secondary | ICD-10-CM | POA: Diagnosis not present

## 2018-07-02 DIAGNOSIS — Z590 Homelessness: Secondary | ICD-10-CM | POA: Diagnosis not present

## 2018-07-02 LAB — BASIC METABOLIC PANEL
ANION GAP: 10 (ref 5–15)
BUN: 15 mg/dL (ref 6–20)
CO2: 21 mmol/L — ABNORMAL LOW (ref 22–32)
Calcium: 9.3 mg/dL (ref 8.9–10.3)
Chloride: 108 mmol/L (ref 98–111)
Creatinine, Ser: 1.32 mg/dL — ABNORMAL HIGH (ref 0.61–1.24)
GFR calc non Af Amer: >60 mL/min (ref >60)
Glucose, Bld: 88 mg/dL (ref 70–99)
Potassium: 3.3 mmol/L — ABNORMAL LOW (ref 3.5–5.1)
Sodium: 139 mmol/L (ref 135–145)

## 2018-07-02 LAB — CBC
HCT: 41.4 % (ref 39.0–52.0)
Hemoglobin: 13.2 g/dL (ref 13.0–17.0)
MCH: 28.1 pg (ref 26.0–34.0)
MCHC: 31.9 g/dL (ref 30.0–36.0)
MCV: 88.1 fL (ref 80.0–100.0)
NRBC: 0 % (ref 0.0–0.2)
Platelets: 249 10*3/uL (ref 150–400)
RBC: 4.7 MIL/uL (ref 4.22–5.81)
RDW: 15.4 % (ref 11.5–15.5)
WBC: 6.5 10*3/uL (ref 4.0–10.5)

## 2018-07-02 LAB — I-STAT TROPONIN, ED: Troponin i, poc: 0.04 ng/mL (ref 0.00–0.08)

## 2018-07-02 MED ORDER — SODIUM CHLORIDE 0.9% FLUSH
3.0000 mL | Freq: Once | INTRAVENOUS | Status: AC
  Administered 2018-07-03: 3 mL via INTRAVENOUS

## 2018-07-02 NOTE — Telephone Encounter (Signed)
New Message   Janett Billow a case worker with [facility] in [place] is requesting a call back from [name] regarding some important  Information. Would not got into detail. Please f/u

## 2018-07-02 NOTE — ED Triage Notes (Signed)
Pt reports central to R side CP and L flank pain starting today around 1 pm. Pt reports hx HTN and CHF. Reports he frequently gets this pain. Pt reports SHOB and nausea, no vomiting.

## 2018-07-02 NOTE — ED Notes (Signed)
Called for vitals recheck x1 no answer

## 2018-07-02 NOTE — Telephone Encounter (Signed)
Case worker requested to speak with PCP directly with pertinent information pertaining to the patient.

## 2018-07-03 LAB — URINALYSIS, ROUTINE W REFLEX MICROSCOPIC
Bilirubin Urine: NEGATIVE
Glucose, UA: NEGATIVE mg/dL
Hgb urine dipstick: NEGATIVE
Ketones, ur: NEGATIVE mg/dL
Leukocytes,Ua: NEGATIVE
Nitrite: NEGATIVE
Protein, ur: NEGATIVE mg/dL
Specific Gravity, Urine: 1.015 (ref 1.005–1.030)
pH: 5 (ref 5.0–8.0)

## 2018-07-03 LAB — BRAIN NATRIURETIC PEPTIDE: B Natriuretic Peptide: 1225.7 pg/mL — ABNORMAL HIGH (ref 0.0–100.0)

## 2018-07-03 LAB — I-STAT TROPONIN, ED: Troponin i, poc: 0.03 ng/mL (ref 0.00–0.08)

## 2018-07-03 MED ORDER — POTASSIUM CHLORIDE CRYS ER 20 MEQ PO TBCR
20.0000 meq | EXTENDED_RELEASE_TABLET | Freq: Two times a day (BID) | ORAL | Status: DC
  Administered 2018-07-03: 20 meq via ORAL
  Filled 2018-07-03: qty 1

## 2018-07-03 MED ORDER — FUROSEMIDE 20 MG PO TABS
80.0000 mg | ORAL_TABLET | Freq: Every day | ORAL | Status: DC
  Administered 2018-07-03: 80 mg via ORAL
  Filled 2018-07-03: qty 4

## 2018-07-03 MED ORDER — TRAMADOL HCL 50 MG PO TABS: 50.0000 mg | ORAL_TABLET | Freq: Two times a day (BID) | ORAL | Status: DC | PRN

## 2018-07-03 MED ORDER — FUROSEMIDE 40 MG PO TABS: 80.0000 mg | ORAL_TABLET | Freq: Every day | ORAL | 0 refills | Status: DC

## 2018-07-03 NOTE — ED Notes (Signed)
Pt currently using bathroom.

## 2018-07-03 NOTE — ED Provider Notes (Signed)
Patient seen/examined in the Emergency Department in conjunction with Advanced Practice Provider [facility] Patient reports chest pain and shortness of breath.  Pt with h/o CAD - reports MI in [place] and [place] Exam : awake/alert, appears anxious and tachypneic on my evaluation Plan: plan to ambulate patient ,if fails he will need to be admitted     Ripley Fraise, MD 07/03/18 [phone]

## 2018-07-03 NOTE — ED Notes (Signed)
Patient verbalizes understanding of discharge instructions. Opportunity for questioning and answers were provided. Armband removed by staff, pt discharged from ED.  

## 2018-07-03 NOTE — ED Notes (Signed)
Pt called for vitals recheck x2 with no answer.  

## 2018-07-03 NOTE — Discharge Instructions (Signed)
Your work-up is reassuring today.  It does show some congestion in your lungs attributed to you not taking your Lasix over the past 3 days.  We recommend that you resume your daily medications.  Follow-up with your primary care doctor.  You may return for new or concerning symptoms.

## 2018-07-03 NOTE — ED Notes (Signed)
Pt had a constant pulse while walking. Pt pulse was between 94 and 97. Nurse notified.
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
## 2018-07-03 NOTE — ED Notes (Signed)
Informed MD of pt's chest pain; MD aware and pt okay to d/c.

## 2018-07-05 ENCOUNTER — Telehealth: Payer: Self-pay | Admitting: Family Medicine

## 2018-07-05 NOTE — Telephone Encounter (Signed)
Pt called in wanted to inform his social worker The name of the clinic in [facility] is access community health and  (Service dog approval) would like a call back to follow up

## 2018-07-05 NOTE — Telephone Encounter (Signed)
Call placed to patient with RN CM, Eden Lathe. Pt shared that he was recently in the ED due to being without his medication for approximately four days. States he filled medications two weeks ago. Pt was strongly encouraged to contact [facility] to inquire about refilling medications.   Pt plans on locating documentation regarding service animal certification and providing it to clinic for PCP to review. No additional concerns noted.

## 2018-07-06 ENCOUNTER — Inpatient Hospital Stay (HOSPITAL_COMMUNITY)
Admission: EM | Admit: 2018-07-06 | Discharge: 2018-07-10 | DRG: 291 | Disposition: A | Discharge status: Home / Self Care | Payer: Medicare HMO | Attending: Internal Medicine | Admitting: Internal Medicine

## 2018-07-06 ENCOUNTER — Emergency Department (HOSPITAL_COMMUNITY): Payer: Medicare HMO

## 2018-07-06 ENCOUNTER — Other Ambulatory Visit: Payer: Self-pay

## 2018-07-06 ENCOUNTER — Encounter (HOSPITAL_COMMUNITY): Payer: Self-pay

## 2018-07-06 DIAGNOSIS — I5082 Biventricular heart failure: Secondary | ICD-10-CM | POA: Diagnosis present

## 2018-07-06 DIAGNOSIS — Z9114 Patient's other noncompliance with medication regimen: Secondary | ICD-10-CM

## 2018-07-06 DIAGNOSIS — M79601 Pain in right arm: Secondary | ICD-10-CM | POA: Diagnosis present

## 2018-07-06 DIAGNOSIS — R109 Unspecified abdominal pain: Secondary | ICD-10-CM | POA: Diagnosis present

## 2018-07-06 DIAGNOSIS — K5900 Constipation, unspecified: Secondary | ICD-10-CM | POA: Diagnosis present

## 2018-07-06 DIAGNOSIS — G629 Polyneuropathy, unspecified: Secondary | ICD-10-CM | POA: Diagnosis present

## 2018-07-06 DIAGNOSIS — F17210 Nicotine dependence, cigarettes, uncomplicated: Secondary | ICD-10-CM | POA: Diagnosis present

## 2018-07-06 DIAGNOSIS — M792 Neuralgia and neuritis, unspecified: Secondary | ICD-10-CM | POA: Diagnosis not present

## 2018-07-06 DIAGNOSIS — I081 Rheumatic disorders of both mitral and tricuspid valves: Secondary | ICD-10-CM | POA: Diagnosis present

## 2018-07-06 DIAGNOSIS — G8929 Other chronic pain: Secondary | ICD-10-CM | POA: Diagnosis present

## 2018-07-06 DIAGNOSIS — K760 Fatty (change of) liver, not elsewhere classified: Secondary | ICD-10-CM | POA: Diagnosis present

## 2018-07-06 DIAGNOSIS — R350 Frequency of micturition: Secondary | ICD-10-CM | POA: Diagnosis present

## 2018-07-06 DIAGNOSIS — Z8572 Personal history of non-Hodgkin lymphomas: Secondary | ICD-10-CM

## 2018-07-06 DIAGNOSIS — E876 Hypokalemia: Secondary | ICD-10-CM | POA: Diagnosis present

## 2018-07-06 DIAGNOSIS — I160 Hypertensive urgency: Secondary | ICD-10-CM

## 2018-07-06 DIAGNOSIS — I248 Other forms of acute ischemic heart disease: Secondary | ICD-10-CM | POA: Diagnosis present

## 2018-07-06 DIAGNOSIS — T502X5A Adverse effect of carbonic-anhydrase inhibitors, benzothiadiazides and other diuretics, initial encounter: Secondary | ICD-10-CM | POA: Diagnosis present

## 2018-07-06 DIAGNOSIS — N182 Chronic kidney disease, stage 2 (mild): Secondary | ICD-10-CM | POA: Diagnosis present

## 2018-07-06 DIAGNOSIS — I2510 Atherosclerotic heart disease of native coronary artery without angina pectoris: Secondary | ICD-10-CM | POA: Diagnosis present

## 2018-07-06 DIAGNOSIS — R1011 Right upper quadrant pain: Secondary | ICD-10-CM | POA: Diagnosis not present

## 2018-07-06 DIAGNOSIS — I130 Hypertensive heart and chronic kidney disease with heart failure and stage 1 through stage 4 chronic kidney disease, or unspecified chronic kidney disease: Secondary | ICD-10-CM | POA: Diagnosis present

## 2018-07-06 DIAGNOSIS — Z590 Homelessness: Secondary | ICD-10-CM | POA: Diagnosis not present

## 2018-07-06 DIAGNOSIS — R10811 Right upper quadrant abdominal tenderness: Secondary | ICD-10-CM

## 2018-07-06 DIAGNOSIS — R7989 Other specified abnormal findings of blood chemistry: Secondary | ICD-10-CM | POA: Diagnosis not present

## 2018-07-06 DIAGNOSIS — R079 Chest pain, unspecified: Secondary | ICD-10-CM | POA: Diagnosis present

## 2018-07-06 DIAGNOSIS — R778 Other specified abnormalities of plasma proteins: Secondary | ICD-10-CM | POA: Diagnosis present

## 2018-07-06 DIAGNOSIS — F1410 Cocaine abuse, uncomplicated: Secondary | ICD-10-CM | POA: Diagnosis present

## 2018-07-06 DIAGNOSIS — E278 Other specified disorders of adrenal gland: Secondary | ICD-10-CM | POA: Diagnosis present

## 2018-07-06 DIAGNOSIS — Z9119 Patient's noncompliance with other medical treatment and regimen: Secondary | ICD-10-CM | POA: Diagnosis not present

## 2018-07-06 DIAGNOSIS — E785 Hyperlipidemia, unspecified: Secondary | ICD-10-CM | POA: Diagnosis present

## 2018-07-06 DIAGNOSIS — R0602 Shortness of breath: Secondary | ICD-10-CM | POA: Diagnosis present

## 2018-07-06 DIAGNOSIS — Z8249 Family history of ischemic heart disease and other diseases of the circulatory system: Secondary | ICD-10-CM

## 2018-07-06 DIAGNOSIS — G43909 Migraine, unspecified, not intractable, without status migrainosus: Secondary | ICD-10-CM | POA: Diagnosis present

## 2018-07-06 DIAGNOSIS — Z841 Family history of disorders of kidney and ureter: Secondary | ICD-10-CM

## 2018-07-06 DIAGNOSIS — I25119 Atherosclerotic heart disease of native coronary artery with unspecified angina pectoris: Secondary | ICD-10-CM | POA: Diagnosis not present

## 2018-07-06 DIAGNOSIS — N179 Acute kidney failure, unspecified: Secondary | ICD-10-CM | POA: Diagnosis present

## 2018-07-06 DIAGNOSIS — I429 Cardiomyopathy, unspecified: Secondary | ICD-10-CM | POA: Diagnosis present

## 2018-07-06 DIAGNOSIS — I5043 Acute on chronic combined systolic (congestive) and diastolic (congestive) heart failure: Secondary | ICD-10-CM | POA: Diagnosis present

## 2018-07-06 DIAGNOSIS — I509 Heart failure, unspecified: Secondary | ICD-10-CM

## 2018-07-06 DIAGNOSIS — R911 Solitary pulmonary nodule: Secondary | ICD-10-CM | POA: Diagnosis present

## 2018-07-06 DIAGNOSIS — M25561 Pain in right knee: Secondary | ICD-10-CM | POA: Diagnosis present

## 2018-07-06 DIAGNOSIS — I252 Old myocardial infarction: Secondary | ICD-10-CM

## 2018-07-06 DIAGNOSIS — E041 Nontoxic single thyroid nodule: Secondary | ICD-10-CM | POA: Diagnosis present

## 2018-07-06 DIAGNOSIS — E279 Disorder of adrenal gland, unspecified: Secondary | ICD-10-CM | POA: Diagnosis not present

## 2018-07-06 DIAGNOSIS — J4520 Mild intermittent asthma, uncomplicated: Secondary | ICD-10-CM | POA: Diagnosis present

## 2018-07-06 DIAGNOSIS — R0789 Other chest pain: Secondary | ICD-10-CM | POA: Diagnosis not present

## 2018-07-06 DIAGNOSIS — I340 Nonrheumatic mitral (valve) insufficiency: Secondary | ICD-10-CM | POA: Diagnosis not present

## 2018-07-06 DIAGNOSIS — Z7982 Long term (current) use of aspirin: Secondary | ICD-10-CM

## 2018-07-06 DIAGNOSIS — F4310 Post-traumatic stress disorder, unspecified: Secondary | ICD-10-CM | POA: Diagnosis present

## 2018-07-06 DIAGNOSIS — Z955 Presence of coronary angioplasty implant and graft: Secondary | ICD-10-CM

## 2018-07-06 DIAGNOSIS — Z79899 Other long term (current) drug therapy: Secondary | ICD-10-CM

## 2018-07-06 DIAGNOSIS — M5441 Lumbago with sciatica, right side: Secondary | ICD-10-CM | POA: Diagnosis present

## 2018-07-06 LAB — RAPID URINE DRUG SCREEN, HOSP PERFORMED
Amphetamines: NOT DETECTED
Barbiturates: NOT DETECTED
Benzodiazepines: NOT DETECTED
Cocaine: NOT DETECTED
OPIATES: POSITIVE — AB
Tetrahydrocannabinol: POSITIVE — AB

## 2018-07-06 LAB — BASIC METABOLIC PANEL
Anion gap: 7 (ref 5–15)
BUN: 13 mg/dL (ref 6–20)
CO2: 25 mmol/L (ref 22–32)
Calcium: 9.1 mg/dL (ref 8.9–10.3)
Chloride: 107 mmol/L (ref 98–111)
Creatinine, Ser: 1.34 mg/dL — ABNORMAL HIGH (ref 0.61–1.24)
GFR, EST NON AFRICAN AMERICAN: 60 mL/min — AB (ref >60)
Glucose, Bld: 120 mg/dL — ABNORMAL HIGH (ref 70–99)
Potassium: 3.2 mmol/L — ABNORMAL LOW (ref 3.5–5.1)
Sodium: 139 mmol/L (ref 135–145)

## 2018-07-06 LAB — TROPONIN I
Troponin I: 0.05 ng/mL (ref <0.03)
Troponin I: 0.05 ng/mL (ref <0.03)

## 2018-07-06 LAB — CBC
HCT: 40.2 % (ref 39.0–52.0)
Hemoglobin: 12.8 g/dL — ABNORMAL LOW (ref 13.0–17.0)
MCH: 28.4 pg (ref 26.0–34.0)
MCHC: 31.8 g/dL (ref 30.0–36.0)
MCV: 89.1 fL (ref 80.0–100.0)
Platelets: 209 10*3/uL (ref 150–400)
RBC: 4.51 MIL/uL (ref 4.22–5.81)
RDW: 16 % — ABNORMAL HIGH (ref 11.5–15.5)
WBC: 6.6 10*3/uL (ref 4.0–10.5)
nRBC: 0 % (ref 0.0–0.2)

## 2018-07-06 LAB — HEPATIC FUNCTION PANEL
ALBUMIN: 4 g/dL (ref 3.5–5.0)
ALT: 14 U/L (ref 0–44)
AST: 20 U/L (ref 15–41)
Alkaline Phosphatase: 71 U/L (ref 38–126)
Bilirubin, Direct: 0.2 mg/dL (ref 0.0–0.2)
Indirect Bilirubin: 1.5 mg/dL — ABNORMAL HIGH (ref 0.3–0.9)
Total Bilirubin: 1.7 mg/dL — ABNORMAL HIGH (ref 0.3–1.2)
Total Protein: 7.3 g/dL (ref 6.5–8.1)

## 2018-07-06 LAB — LIPASE, BLOOD: Lipase: 32 U/L (ref 11–51)

## 2018-07-06 LAB — BRAIN NATRIURETIC PEPTIDE: B Natriuretic Peptide: 1662.6 pg/mL — ABNORMAL HIGH (ref 0.0–100.0)

## 2018-07-06 MED ORDER — SODIUM CHLORIDE 0.9 % IV SOLN: 250.0000 mL | INTRAVENOUS | Status: DC | PRN

## 2018-07-06 MED ORDER — HYDRALAZINE HCL 20 MG/ML IJ SOLN: 5.0000 mg | INTRAMUSCULAR | Status: DC | PRN

## 2018-07-06 MED ORDER — SODIUM CHLORIDE 0.9% FLUSH
3.0000 mL | Freq: Two times a day (BID) | INTRAVENOUS | Status: DC
  Administered 2018-07-07 – 2018-07-09 (×7): 3 mL via INTRAVENOUS

## 2018-07-06 MED ORDER — ASPIRIN 81 MG PO CHEW: 324.0000 mg | CHEWABLE_TABLET | Freq: Every day | ORAL | Status: DC

## 2018-07-06 MED ORDER — ALBUTEROL SULFATE (2.5 MG/3ML) 0.083% IN NEBU: 2.5000 mg | INHALATION_SOLUTION | RESPIRATORY_TRACT | Status: DC | PRN

## 2018-07-06 MED ORDER — DM-GUAIFENESIN ER 30-600 MG PO TB12: 1.0000 | ORAL_TABLET | Freq: Two times a day (BID) | ORAL | Status: DC | PRN

## 2018-07-06 MED ORDER — POTASSIUM CHLORIDE CRYS ER 20 MEQ PO TBCR
60.0000 meq | EXTENDED_RELEASE_TABLET | Freq: Once | ORAL | Status: AC
  Administered 2018-07-07: 60 meq via ORAL
  Filled 2018-07-06: qty 3

## 2018-07-06 MED ORDER — MORPHINE SULFATE (PF) 4 MG/ML IV SOLN
4.0000 mg | Freq: Once | INTRAVENOUS | Status: AC
  Administered 2018-07-06: 4 mg via INTRAVENOUS
  Filled 2018-07-06: qty 1

## 2018-07-06 MED ORDER — TRAMADOL HCL 50 MG PO TABS
50.0000 mg | ORAL_TABLET | Freq: Two times a day (BID) | ORAL | Status: DC | PRN
  Administered 2018-07-07: 50 mg via ORAL
  Filled 2018-07-06: qty 1

## 2018-07-06 MED ORDER — FUROSEMIDE 10 MG/ML IJ SOLN
80.0000 mg | Freq: Two times a day (BID) | INTRAMUSCULAR | Status: DC
  Administered 2018-07-07 – 2018-07-08 (×3): 80 mg via INTRAVENOUS
  Filled 2018-07-06 (×3): qty 8

## 2018-07-06 MED ORDER — ACETAMINOPHEN 325 MG PO TABS
650.0000 mg | ORAL_TABLET | ORAL | Status: DC | PRN
  Administered 2018-07-09 – 2018-07-10 (×2): 650 mg via ORAL
  Filled 2018-07-06 (×2): qty 2

## 2018-07-06 MED ORDER — NICOTINE 21 MG/24HR TD PT24
21.0000 mg | MEDICATED_PATCH | Freq: Every day | TRANSDERMAL | Status: DC
  Administered 2018-07-07 – 2018-07-10 (×4): 21 mg via TRANSDERMAL
  Filled 2018-07-06 (×4): qty 1

## 2018-07-06 MED ORDER — METOPROLOL SUCCINATE ER 25 MG PO TB24: 25.0000 mg | ORAL_TABLET | Freq: Every day | ORAL | Status: DC

## 2018-07-06 MED ORDER — ENOXAPARIN SODIUM 40 MG/0.4ML ~~LOC~~ SOLN
40.0000 mg | Freq: Every day | SUBCUTANEOUS | Status: DC
  Administered 2018-07-07 – 2018-07-10 (×4): 40 mg via SUBCUTANEOUS
  Filled 2018-07-06 (×4): qty 0.4

## 2018-07-06 MED ORDER — ATORVASTATIN CALCIUM 80 MG PO TABS
80.0000 mg | ORAL_TABLET | Freq: Every day | ORAL | Status: DC
  Administered 2018-07-07 – 2018-07-10 (×4): 80 mg via ORAL
  Filled 2018-07-06 (×4): qty 1

## 2018-07-06 MED ORDER — SODIUM CHLORIDE (PF) 0.9 % IJ SOLN
INTRAMUSCULAR | Status: AC
  Filled 2018-07-06: qty 50

## 2018-07-06 MED ORDER — NITROGLYCERIN IN D5W 200-5 MCG/ML-% IV SOLN
0.0000 ug/min | INTRAVENOUS | Status: DC
  Administered 2018-07-06: 5 ug/min via INTRAVENOUS
  Filled 2018-07-06: qty 250

## 2018-07-06 MED ORDER — FUROSEMIDE 10 MG/ML IJ SOLN
80.0000 mg | Freq: Once | INTRAMUSCULAR | Status: AC
  Administered 2018-07-06: 80 mg via INTRAVENOUS
  Filled 2018-07-06: qty 8

## 2018-07-06 MED ORDER — ONDANSETRON HCL 4 MG/2ML IJ SOLN: 4.0000 mg | Freq: Four times a day (QID) | INTRAMUSCULAR | Status: DC | PRN

## 2018-07-06 MED ORDER — NITROGLYCERIN 0.4 MG SL SUBL: 0.4000 mg | SUBLINGUAL_TABLET | SUBLINGUAL | Status: DC | PRN

## 2018-07-06 MED ORDER — ONDANSETRON HCL 4 MG/2ML IJ SOLN
4.0000 mg | Freq: Once | INTRAMUSCULAR | Status: AC
  Administered 2018-07-06: 4 mg via INTRAVENOUS
  Filled 2018-07-06: qty 2

## 2018-07-06 MED ORDER — IOHEXOL 350 MG/ML SOLN
100.0000 mL | Freq: Once | INTRAVENOUS | Status: AC | PRN
  Administered 2018-07-06: 100 mL via INTRAVENOUS

## 2018-07-06 MED ORDER — SODIUM CHLORIDE 0.9% FLUSH: 3.0000 mL | INTRAVENOUS | Status: DC | PRN

## 2018-07-06 MED ORDER — MORPHINE SULFATE (PF) 2 MG/ML IV SOLN: 2.0000 mg | INTRAVENOUS | Status: DC | PRN

## 2018-07-06 MED ORDER — SUMATRIPTAN SUCCINATE 50 MG PO TABS
50.0000 mg | ORAL_TABLET | ORAL | Status: DC | PRN
  Administered 2018-07-07 – 2018-07-08 (×2): 50 mg via ORAL
  Filled 2018-07-06 (×5): qty 1

## 2018-07-06 MED ORDER — ASPIRIN 81 MG PO CHEW
324.0000 mg | CHEWABLE_TABLET | Freq: Once | ORAL | Status: AC
  Administered 2018-07-06: 324 mg via ORAL
  Filled 2018-07-06: qty 4

## 2018-07-06 NOTE — Telephone Encounter (Signed)
Call placed however the [place] per phone message is only available until 3 pm therefore will call again tomorrow
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
## 2018-07-06 NOTE — ED Triage Notes (Signed)
Pt arrives via POV from home. Pt reports that he has been having body aches, congestion and cough for 4-5 days. Pt reports that [facility] but the wait was too long so he left. Pt has multiple complaints pt reports chronic back pain. Pt denies any injury or fall.
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
## 2018-07-06 NOTE — ED Notes (Signed)
[facility] called for transport. 

## 2018-07-07 ENCOUNTER — Inpatient Hospital Stay (HOSPITAL_COMMUNITY): Payer: Medicare HMO

## 2018-07-07 DIAGNOSIS — I340 Nonrheumatic mitral (valve) insufficiency: Secondary | ICD-10-CM

## 2018-07-07 DIAGNOSIS — R079 Chest pain, unspecified: Secondary | ICD-10-CM

## 2018-07-07 DIAGNOSIS — Z9119 Patient's noncompliance with other medical treatment and regimen: Secondary | ICD-10-CM

## 2018-07-07 DIAGNOSIS — I509 Heart failure, unspecified: Secondary | ICD-10-CM

## 2018-07-07 DIAGNOSIS — R0789 Other chest pain: Secondary | ICD-10-CM

## 2018-07-07 LAB — URINALYSIS, ROUTINE W REFLEX MICROSCOPIC
Bilirubin Urine: NEGATIVE
Glucose, UA: NEGATIVE mg/dL
Hgb urine dipstick: NEGATIVE
Ketones, ur: NEGATIVE mg/dL
Leukocytes,Ua: NEGATIVE
Nitrite: NEGATIVE
Protein, ur: NEGATIVE mg/dL
Specific Gravity, Urine: 1.012 (ref 1.005–1.030)
pH: 6 (ref 5.0–8.0)

## 2018-07-07 LAB — MRSA PCR SCREENING: MRSA by PCR: NEGATIVE

## 2018-07-07 LAB — HIV ANTIBODY (ROUTINE TESTING W REFLEX): HIV Screen 4th Generation wRfx: NONREACTIVE

## 2018-07-07 MED ORDER — NITROGLYCERIN IN D5W 200-5 MCG/ML-% IV SOLN: 0.0000 ug/min | INTRAVENOUS | Status: DC

## 2018-07-07 MED ORDER — ISOSORB DINITRATE-HYDRALAZINE 20-37.5 MG PO TABS
1.0000 | ORAL_TABLET | Freq: Three times a day (TID) | ORAL | Status: DC
  Administered 2018-07-07 – 2018-07-10 (×11): 1 via ORAL
  Filled 2018-07-07 (×11): qty 1

## 2018-07-07 MED ORDER — ASPIRIN 81 MG PO CHEW
81.0000 mg | CHEWABLE_TABLET | Freq: Every day | ORAL | Status: DC
  Administered 2018-07-07 – 2018-07-10 (×4): 81 mg via ORAL
  Filled 2018-07-07 (×4): qty 1

## 2018-07-07 MED ORDER — HYDRALAZINE HCL 50 MG PO TABS: 25.0000 mg | ORAL_TABLET | Freq: Three times a day (TID) | ORAL | Status: DC

## 2018-07-07 MED ORDER — CARVEDILOL 6.25 MG PO TABS
6.2500 mg | ORAL_TABLET | Freq: Two times a day (BID) | ORAL | Status: DC
  Administered 2018-07-07 – 2018-07-10 (×7): 6.25 mg via ORAL
  Filled 2018-07-07 (×7): qty 1

## 2018-07-07 MED ORDER — ISOSORBIDE DINITRATE 10 MG PO TABS: 20.0000 mg | ORAL_TABLET | Freq: Three times a day (TID) | ORAL | Status: DC
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
## 2018-07-07 NOTE — Progress Notes (Signed)
  Echocardiogram 2D Echocardiogram has been performed.  Cody Guzman 07/07/2018, 4:08 PM

## 2018-07-07 NOTE — Progress Notes (Signed)
PROGRESS NOTE    Cody Guzman  ZHG:[id number] DOB: [birth year] DOA: 07/06/2018 PCP: Antony Blackbird, MD  Outpatient Specialists:     Brief Narrative:  Cody Guzman is a 53 y.o. male with medical history significant of hypertension, asthma, CAD (4 stent placement 2016), CHF (not sure which type of CHF), and NHL in remission since 1980, substance abuse, tobacco abuse, chronic back pain, CKD-2, who presents with shortness of breath and chest pain.  According to the patient, he was not able to take his medications for about a week as his landlord kicked him out of the house and locked his medication inside.  07/07/2018: Patient seen.  Patient seems to be improving.  Shortness of breath is improving.  Dyspnea has resolved significantly.  Also discussed with the cardiology team.  Assessment & Plan:   Principal Problem:   CHF exacerbation ([place]) Active Problems:   Coronary artery disease   Mild intermittent asthma   Chronic kidney disease (CKD), stage II (mild)   HLD (hyperlipidemia)   Hypokalemia   Chest pain   Elevated troponin   Lung nodule   Thyroid nodule   Adrenal nodule ([facility])   RUQ abdominal pain  CHF exacerbation ([place]), type unknown: Continue diuresis Cardiology input is highly appreciated. Follow echocardiogram. Need to be compliant with medication discussed extensively with the patient. Continue to monitor renal function and electrolytes. Strict I's and O's. With patient daily. Further management will depend on hospital course.  Hx of CAD, and chest pain and elevated trop:  s/p of stent placement.  Trop 0.05, but remains flat.   No further chest pain.   - prn Nitroglycerin, Morphine, and lipitor  - ASA 81 mg daily per Dr. Kalman Shan Follow echo.  Mild intermittent asthma: -prn albuterol nebs -Stable.  Chronic kidney disease (CKD), stage II (mild): stable.  Baseline creatinine 1.2-1.3 recently.  His creatinine is 1.34, BUN 13. -Follow-up renal function by  BMP  HLD (hyperlipidemia): -lipitor  Hypokalemia: K=3.2 on admission. - Repleted - Check Mg level  Lung nodule: -f/u with PCP  Adrenal nodule ([place]): -f/u with PCP  RUQ abdominal pain:   CT abdomen/pelvis showed gallbladder wall thickening, but RUQ-ultrasound did not confirm CT finding of gallbladder wall thickness.  No fever or leukocytosis.  Transaminases normal.  May be due to liver congestion secondary to CHF exacerbation. - Observe and supportive care  DVT ppx: SQ Lovenox Code Status: Full code Family Communication:    Disposition Plan:  Anticipate discharge back to previous home environment Consults called:  Cardio  Procedures:   None  Antimicrobials:   None   Subjective: Shortness of breath is improving.   No chest pain.   No fever or chills.    Objective: Vitals:   07/07/18 0700 07/07/18 0852 07/07/18 0900 07/07/18 0912  BP: (!) 124/98   (!) 126/100  Pulse: 77   75  Resp: (!) 26     Temp:  (!) 97.5 F (36.4 C) (!) 97.5 F (36.4 C)   TempSrc:  Oral Oral   SpO2: 99%     Weight:      Height:        Intake/Output Summary (Last 24 hours) at 07/07/2018 1149 Last data filed at 07/07/2018 0900 Gross per 24 hour  Intake 0 ml  Output 2100 ml  Net -2100 ml   Filed Weights   07/06/18 1459 07/06/18 2349 07/07/18 0500  Weight: 97.1 kg 95.5 kg 95.5 kg    Examination:  General exam: Appears calm and comfortable  Respiratory system: Clear to auscultation. Cardiovascular system: S1 & S2, soft systolic murmur with increased intensity of S2.   Gastrointestinal system: Abdomen is nondistended, soft and nontender. No organomegaly or masses felt. Normal bowel sounds heard. Central nervous system: Alert and oriented. No focal neurological deficits.  Moves all limbs. Extremities: No ankle edema.  Data Reviewed: I have personally reviewed following labs and imaging studies  CBC: Recent Labs  Lab 07/02/18 2148 07/06/18 1641  WBC 6.5 6.6  HGB 13.2 12.8*   HCT 41.4 40.2  MCV 88.1 89.1  PLT 249 782   Basic Metabolic Panel: Recent Labs  Lab 07/02/18 2148 07/06/18 1641  NA 139 139  K 3.3* 3.2*  CL 108 107  CO2 21* 25  GLUCOSE 88 120*  BUN 15 13  CREATININE 1.32* 1.34*  CALCIUM 9.3 9.1   GFR: Estimated Creatinine Clearance: 70.6 mL/min (A) (by C-G formula based on SCr of 1.34 mg/dL (H)). Liver Function Tests: Recent Labs  Lab 07/06/18 1651  AST 20  ALT 14  ALKPHOS 71  BILITOT 1.7*  PROT 7.3  ALBUMIN 4.0   Recent Labs  Lab 07/06/18 1651  LIPASE 32   No results for input(s): AMMONIA in the last 168 hours. Coagulation Profile: No results for input(s): INR, PROTIME in the last 168 hours. Cardiac Enzymes: Recent Labs  Lab 07/06/18 1641 07/06/18 2223  TROPONINI 0.05* 0.05*   BNP (last 3 results) No results for input(s): PROBNP in the last 8760 hours. HbA1C: No results for input(s): HGBA1C in the last 72 hours. CBG: No results for input(s): GLUCAP in the last 168 hours. Lipid Profile: No results for input(s): CHOL, HDL, LDLCALC, TRIG, CHOLHDL, LDLDIRECT in the last 72 hours. Thyroid Function Tests: No results for input(s): TSH, T4TOTAL, FREET4, T3FREE, THYROIDAB in the last 72 hours. Anemia Panel: No results for input(s): VITAMINB12, FOLATE, FERRITIN, TIBC, IRON, RETICCTPCT in the last 72 hours. Urine analysis:    Component Value Date/Time   COLORURINE YELLOW 07/03/2018 0600   APPEARANCEUR CLEAR 07/03/2018 0600   LABSPEC 1.015 07/03/2018 0600   PHURINE 5.0 07/03/2018 0600   GLUCOSEU NEGATIVE 07/03/2018 0600   HGBUR NEGATIVE 07/03/2018 0600   BILIRUBINUR NEGATIVE 07/03/2018 0600   BILIRUBINUR negative 03/15/2018 1749   KETONESUR NEGATIVE 07/03/2018 0600   PROTEINUR NEGATIVE 07/03/2018 0600   UROBILINOGEN negative (A) 03/15/2018 1749   NITRITE NEGATIVE 07/03/2018 0600   LEUKOCYTESUR NEGATIVE 07/03/2018 0600   Sepsis Labs: @LABRCNTIP (procalcitonin:4,lacticidven:4)  ) Recent Results (from the past 240  hour(s))  MRSA PCR Screening     Status: None   Collection Time: 07/06/18 11:49 PM  Result Value Ref Range Status   MRSA by PCR NEGATIVE NEGATIVE Final    Comment:        The GeneXpert MRSA Assay (FDA approved for NASAL specimens only), is one component of a comprehensive MRSA colonization surveillance program. It is not intended to diagnose MRSA infection nor to guide or monitor treatment for MRSA infections. Performed at [facility], [place] [address] [place], [place] [address]          Radiology Studies: Dg Chest 2 View  Result Date: 07/06/2018 CLINICAL DATA:  Shortness of breath, body aches and congestion for 4-5 days, past history of cardiomyopathy, CHF, smoking, non-Hodgkin's lymphoma EXAM: CHEST - 2 VIEW COMPARISON:  07/02/2018 FINDINGS: Enlargement of cardiac silhouette with pulmonary vascular congestion. Mediastinal contour stable. Chronic accentuation of interstitial markings unchanged likely related to minimal chronic failure. No acute superimposed pulmonary edema, segmental  consolidation, pleural effusion, or pneumothorax. Bones unremarkable. IMPRESSION: Enlargement of cardiac silhouette with pulmonary vascular congestion and minimal chronic failure. No acute abnormalities. Electronically Signed   By: Lavonia Dana M.D.   On: 07/06/2018 16:37   Ct Angio Chest Pe W And/or Wo Contrast  Result Date: 07/06/2018 CLINICAL DATA:  53 y/o M; body aches, congestion, and cough for 4-5 days. EXAM: CT ANGIOGRAPHY CHEST CT ABDOMEN AND PELVIS WITH CONTRAST TECHNIQUE: Multidetector CT imaging of the chest was performed using the standard protocol during bolus administration of intravenous contrast. Multiplanar CT image reconstructions and MIPs were obtained to evaluate the vascular anatomy. Multidetector CT imaging of the abdomen and pelvis was performed using the standard protocol during bolus administration of intravenous contrast. CONTRAST:  140 cc OMNIPAQUE IOHEXOL 350 MG/ML SOLN  COMPARISON:  None. FINDINGS: CTA CHEST FINDINGS Cardiovascular: Cardiomegaly. No pericardial effusion. Normal caliber thoracic aorta. Main pulmonary artery measures 3.5 cm. Mild to moderate coronary artery calcific atherosclerosis. Satisfactory opacification of the pulmonary arteries. No pulmonary embolus identified. Mediastinum/Nodes: Mildly enlarged mediastinal lymph nodes, for example a prevascular node measuring 23 x 15 mm (series 4, image 32) and a right upper paratracheal lymph node measuring 15 x 16 mm (series 4, image 25). No axillary adenopathy. 18 mm nodule in the right lobe of the thyroid gland. Normal thoracic esophagus. Lungs/Pleura: Smooth interlobular septal thickening compatible with interstitial edema. Small right pleural effusion. No consolidation or pneumothorax. Few scattered pulmonary nodules measuring up to 6 x 4 mm, mean 5 mm in the left upper lobe (series 10, image 64) and additional smaller nodules on the right minor fissure. Musculoskeletal: No chest wall abnormality. No acute or significant osseous findings. Review of the MIP images confirms the above findings. CT ABDOMEN and PELVIS FINDINGS Hepatobiliary: No focal liver abnormality is seen. Gallbladder wall thickening. No radiopaque gallstone or biliary ductal dilatation. Pancreas: Unremarkable. No pancreatic ductal dilatation or surrounding inflammatory changes. Spleen: Normal in size without focal abnormality. Adrenals/Urinary Tract: Indeterminate 10 mm right adrenal nodule (series 11, image 29). Multiple subcentimeter hypodensities within the renal cortices, compatible with cysts otherwise. Kidneys are normal, without renal calculi, focal lesion, or hydronephrosis. Bladder is unremarkable. Stomach/Bowel: Stomach is within normal limits. Appendix appears normal. No evidence of bowel wall thickening, distention, or inflammatory changes. Duodenum diverticula arising from the third segment of the duodenum, no associated inflammatory  changes. Vascular/Lymphatic: Aortic atherosclerosis. No enlarged abdominal or pelvic lymph nodes. Reproductive: Prostate is unremarkable. Other: No abdominal wall hernia or abnormality. No abdominopelvic ascites. Musculoskeletal: No fracture is seen. Review of the MIP images confirms the above findings. IMPRESSION: CTA chest: 1. No pulmonary embolus identified. 2. Cardiomegaly, interstitial pulmonary edema, small right pleural effusion. 3. Enlarged main pulmonary artery indicates pulmonary artery hypertension. 4. Multiple pulmonary nodules measuring up to 5 mm. No follow-up needed if patient is low-risk (and has no known or suspected primary neoplasm). Non-contrast chest CT can be considered in 12 months if patient is high-risk. This recommendation follows the consensus statement: Guidelines for Management of Incidental Pulmonary Nodules Detected on CT Images: From the Fleischner Society 2017; Radiology 2017; 284:228-243. 5. 18 mm nodule in right lobe of thyroid gland. Thyroid ultrasound is recommended on a nonemergent basis. CT abdomen and pelvis: 1. Gallbladder wall thickening. No radiopaque gallstone. No biliary ductal dilatation. Findings are nonspecific and may represent cholecystitis, reactive changes due to local inflammation, heart failure, or hypoproteinemia. Consider right upper quadrant ultrasound to assess for radiolucent gallstones. 2. Indeterminate 10 mm right adrenal nodule.  In the absence of primary malignancy this is probably benign. Consider 12 month follow-up with adrenal CT. This recommendation follows ACR consensus guidelines: Management of Incidental Adrenal Masses: A White Paper of the. Electronically Signed   By: Kristine Garbe M.D.   On: 07/06/2018 19:57   Ct Abdomen Pelvis W Contrast  Result Date: 07/06/2018 CLINICAL DATA:  53 y/o M; body aches, congestion, and cough for 4-5 days. EXAM: CT ANGIOGRAPHY CHEST CT ABDOMEN AND PELVIS WITH CONTRAST TECHNIQUE: Multidetector CT imaging  of the chest was performed using the standard protocol during bolus administration of intravenous contrast. Multiplanar CT image reconstructions and MIPs were obtained to evaluate the vascular anatomy. Multidetector CT imaging of the abdomen and pelvis was performed using the standard protocol during bolus administration of intravenous contrast. CONTRAST:  140 cc OMNIPAQUE IOHEXOL 350 MG/ML SOLN COMPARISON:  None. FINDINGS: CTA CHEST FINDINGS Cardiovascular: Cardiomegaly. No pericardial effusion. Normal caliber thoracic aorta. Main pulmonary artery measures 3.5 cm. Mild to moderate coronary artery calcific atherosclerosis. Satisfactory opacification of the pulmonary arteries. No pulmonary embolus identified. Mediastinum/Nodes: Mildly enlarged mediastinal lymph nodes, for example a prevascular node measuring 23 x 15 mm (series 4, image 32) and a right upper paratracheal lymph node measuring 15 x 16 mm (series 4, image 25). No axillary adenopathy. 18 mm nodule in the right lobe of the thyroid gland. Normal thoracic esophagus. Lungs/Pleura: Smooth interlobular septal thickening compatible with interstitial edema. Small right pleural effusion. No consolidation or pneumothorax. Few scattered pulmonary nodules measuring up to 6 x 4 mm, mean 5 mm in the left upper lobe (series 10, image 64) and additional smaller nodules on the right minor fissure. Musculoskeletal: No chest wall abnormality. No acute or significant osseous findings. Review of the MIP images confirms the above findings. CT ABDOMEN and PELVIS FINDINGS Hepatobiliary: No focal liver abnormality is seen. Gallbladder wall thickening. No radiopaque gallstone or biliary ductal dilatation. Pancreas: Unremarkable. No pancreatic ductal dilatation or surrounding inflammatory changes. Spleen: Normal in size without focal abnormality. Adrenals/Urinary Tract: Indeterminate 10 mm right adrenal nodule (series 11, image 29). Multiple subcentimeter hypodensities within the  renal cortices, compatible with cysts otherwise. Kidneys are normal, without renal calculi, focal lesion, or hydronephrosis. Bladder is unremarkable. Stomach/Bowel: Stomach is within normal limits. Appendix appears normal. No evidence of bowel wall thickening, distention, or inflammatory changes. Duodenum diverticula arising from the third segment of the duodenum, no associated inflammatory changes. Vascular/Lymphatic: Aortic atherosclerosis. No enlarged abdominal or pelvic lymph nodes. Reproductive: Prostate is unremarkable. Other: No abdominal wall hernia or abnormality. No abdominopelvic ascites. Musculoskeletal: No fracture is seen. Review of the MIP images confirms the above findings. IMPRESSION: CTA chest: 1. No pulmonary embolus identified. 2. Cardiomegaly, interstitial pulmonary edema, small right pleural effusion. 3. Enlarged main pulmonary artery indicates pulmonary artery hypertension. 4. Multiple pulmonary nodules measuring up to 5 mm. No follow-up needed if patient is low-risk (and has no known or suspected primary neoplasm). Non-contrast chest CT can be considered in 12 months if patient is high-risk. This recommendation follows the consensus statement: Guidelines for Management of Incidental Pulmonary Nodules Detected on CT Images: From the Fleischner Society 2017; Radiology 2017; 284:228-243. 5. 18 mm nodule in right lobe of thyroid gland. Thyroid ultrasound is recommended on a nonemergent basis. CT abdomen and pelvis: 1. Gallbladder wall thickening. No radiopaque gallstone. No biliary ductal dilatation. Findings are nonspecific and may represent cholecystitis, reactive changes due to local inflammation, heart failure, or hypoproteinemia. Consider right upper quadrant ultrasound to assess for radiolucent gallstones.  2. Indeterminate 10 mm right adrenal nodule. In the absence of primary malignancy this is probably benign. Consider 12 month follow-up with adrenal CT. This recommendation follows ACR  consensus guidelines: Management of Incidental Adrenal Masses: A White Paper of the. Electronically Signed   By: Kristine Garbe M.D.   On: 07/06/2018 19:57   US Abdomen Limited Ruq  Result Date: 07/06/2018 CLINICAL DATA:  Right upper quadrant tenderness. EXAM: ULTRASOUND ABDOMEN LIMITED RIGHT UPPER QUADRANT COMPARISON:  CT earlier this day. FINDINGS: Gallbladder: Physiologically distended. No gallstones or wall thickening visualized. Normal gallbladder wall thickness of 2 mm. No sonographic Murphy sign noted by sonographer. Common bile duct: Diameter: 4 mm, normal. Liver: No focal lesion identified. Diffusely increased in parenchymal echogenicity. Portal vein is patent on color Doppler imaging with normal direction of blood flow towards the liver. Right pleural effusion demonstrated.  IVC appears dilated. IMPRESSION: 1. Normal sonographic appearance of gallbladder. Gallbladder wall thickening on CT is not confirmed sonographically. 2. Hepatic steatosis. Electronically Signed   By: Keith Rake M.D.   On: 07/06/2018 22:06        Scheduled Meds: . aspirin  81 mg Oral Daily  . atorvastatin  80 mg Oral Daily  . carvedilol  6.25 mg Oral BID WC  . enoxaparin (LOVENOX) injection  40 mg Subcutaneous Daily  . furosemide  80 mg Intravenous Q12H  . isosorbide-hydrALAZINE  1 tablet Oral TID  . nicotine  21 mg Transdermal Daily  . sodium chloride flush  3 mL Intravenous Q12H   Continuous Infusions: . sodium chloride       LOS: 1 day    Time spent: 35 minutes   Dana Allan, MD  [facility] Pager #: [phone] 7PM-7AM contact night coverage as above

## 2018-07-07 NOTE — Progress Notes (Signed)
Patient requesting to eat. Patient has been NPO for possible TEE today. Paged provider to confirm patient plan for the day. Awaiting response.

## 2018-07-08 DIAGNOSIS — I25119 Atherosclerotic heart disease of native coronary artery with unspecified angina pectoris: Secondary | ICD-10-CM

## 2018-07-08 DIAGNOSIS — E041 Nontoxic single thyroid nodule: Secondary | ICD-10-CM

## 2018-07-08 DIAGNOSIS — R1011 Right upper quadrant pain: Secondary | ICD-10-CM

## 2018-07-08 DIAGNOSIS — I5043 Acute on chronic combined systolic (congestive) and diastolic (congestive) heart failure: Secondary | ICD-10-CM

## 2018-07-08 LAB — CBC WITH DIFFERENTIAL/PLATELET
Abs Immature Granulocytes: 0.02 10*3/uL (ref 0.00–0.07)
Basophils Absolute: 0.1 10*3/uL (ref 0.0–0.1)
Basophils Relative: 1 %
Eosinophils Absolute: 0.5 10*3/uL (ref 0.0–0.5)
Eosinophils Relative: 7 %
HCT: 40.6 % (ref 39.0–52.0)
Hemoglobin: 13.7 g/dL (ref 13.0–17.0)
Immature Granulocytes: 0 %
Lymphocytes Relative: 33 %
Lymphs Abs: 2 10*3/uL (ref 0.7–4.0)
MCH: 28.6 pg (ref 26.0–34.0)
MCHC: 33.7 g/dL (ref 30.0–36.0)
MCV: 84.8 fL (ref 80.0–100.0)
Monocytes Absolute: 0.5 10*3/uL (ref 0.1–1.0)
Monocytes Relative: 9 %
Neutro Abs: 3 10*3/uL (ref 1.7–7.7)
Neutrophils Relative %: 50 %
Platelets: 242 10*3/uL (ref 150–400)
RBC: 4.79 MIL/uL (ref 4.22–5.81)
RDW: 15.8 % — ABNORMAL HIGH (ref 11.5–15.5)
WBC: 6.1 10*3/uL (ref 4.0–10.5)
nRBC: 0 % (ref 0.0–0.2)

## 2018-07-08 LAB — RENAL FUNCTION PANEL
Albumin: 3.4 g/dL — ABNORMAL LOW (ref 3.5–5.0)
Anion gap: 14 (ref 5–15)
BUN: 10 mg/dL (ref 6–20)
CO2: 26 mmol/L (ref 22–32)
Calcium: 8.8 mg/dL — ABNORMAL LOW (ref 8.9–10.3)
Chloride: 98 mmol/L (ref 98–111)
Creatinine, Ser: 1.53 mg/dL — ABNORMAL HIGH (ref 0.61–1.24)
GFR calc Af Amer: 59 mL/min — ABNORMAL LOW (ref >60)
GFR calc non Af Amer: 51 mL/min — ABNORMAL LOW (ref >60)
Glucose, Bld: 103 mg/dL — ABNORMAL HIGH (ref 70–99)
Phosphorus: 3.8 mg/dL (ref 2.5–4.6)
Potassium: 3.1 mmol/L — ABNORMAL LOW (ref 3.5–5.1)
Sodium: 138 mmol/L (ref 135–145)

## 2018-07-08 LAB — MAGNESIUM: Magnesium: 1.5 mg/dL — ABNORMAL LOW (ref 1.7–2.4)

## 2018-07-08 MED ORDER — FUROSEMIDE 40 MG PO TABS
40.0000 mg | ORAL_TABLET | Freq: Two times a day (BID) | ORAL | Status: DC
  Administered 2018-07-09: 40 mg via ORAL
  Filled 2018-07-08: qty 1

## 2018-07-08 MED ORDER — FUROSEMIDE 10 MG/ML IJ SOLN
40.0000 mg | Freq: Two times a day (BID) | INTRAMUSCULAR | Status: AC
  Administered 2018-07-08: 40 mg via INTRAVENOUS
  Filled 2018-07-08: qty 4

## 2018-07-08 NOTE — Progress Notes (Signed)
Progress Note  Patient Name: Cody Guzman Date of Encounter: 07/08/2018  Primary Cardiologist: New--Dr. Los Veteranos I comfortably near flat this AM. No chest pain. Breathing and swelling much improved. Reviewed results of echocardiogram yesterday.  Inpatient Medications    Scheduled Meds: . aspirin  81 mg Oral Daily  . atorvastatin  80 mg Oral Daily  . carvedilol  6.25 mg Oral BID WC  . enoxaparin (LOVENOX) injection  40 mg Subcutaneous Daily  . furosemide  80 mg Intravenous Q12H  . isosorbide-hydrALAZINE  1 tablet Oral TID  . nicotine  21 mg Transdermal Daily  . sodium chloride flush  3 mL Intravenous Q12H   Continuous Infusions: . sodium chloride     PRN Meds: sodium chloride, acetaminophen, albuterol, dextromethorphan-guaiFENesin, morphine injection, nitroGLYCERIN, ondansetron (ZOFRAN) IV, sodium chloride flush, SUMAtriptan, traMADol   Vital Signs    Vitals:   07/07/18 1922 07/07/18 2310 07/08/18 0308 07/08/18 0810  BP: 104/73 98/70 121/86 115/84  Pulse: 75 75 73 69  Resp: 17 (!) 28 (!) 27 19  Temp: 97.7 F (36.5 C) 99 F (37.2 C) 99 F (37.2 C) 98.6 F (37 C)  TempSrc: Oral Oral Oral Oral  SpO2: 98% 95% 98% 97%  Weight:   97.2 kg   Height:        Intake/Output Summary (Last 24 hours) at 07/08/2018 1144 Last data filed at 07/08/2018 1040 Gross per 24 hour  Intake 1080 ml  Output 1600 ml  Net -520 ml   Last 3 Weights 07/08/2018 07/07/2018 07/06/2018  Weight (lbs) 214 lb 4.6 oz 210 lb 8.6 oz 210 lb 8.6 oz  Weight (kg) 97.2 kg 95.5 kg 95.5 kg      Telemetry    SR with rare PVCs - Personally Reviewed  ECG    SR, PVC, LAD - Personally Reviewed  Physical Exam   GEN: No acute distress.   Neck: No JVD at 30 degrees Cardiac: RRR, no rubs, or gallops. 2/6 HSM. Respiratory: Clear to auscultation bilaterally. GI: Soft, nontender, non-distended  MS: No edema; No deformity. Neuro:  Nonfocal  Psych: Normal affect   Labs      Chemistry Recent Labs  Lab 07/02/18 2148 07/06/18 1641 07/06/18 1651 07/08/18 0253  NA 139 139  --  138  K 3.3* 3.2*  --  3.1*  CL 108 107  --  98  CO2 21* 25  --  26  GLUCOSE 88 120*  --  103*  BUN 15 13  --  10  CREATININE 1.32* 1.34*  --  1.53*  CALCIUM 9.3 9.1  --  8.8*  PROT  --   --  7.3  --   ALBUMIN  --   --  4.0 3.4*  AST  --   --  20  --   ALT  --   --  14  --   ALKPHOS  --   --  71  --   BILITOT  --   --  1.7*  --   GFRNONAA >60 60*  --  51*  GFRAA >60 >60  --  59*  ANIONGAP 10 7  --  14     Hematology Recent Labs  Lab 07/02/18 2148 07/06/18 1641 07/08/18 0253  WBC 6.5 6.6 6.1  RBC 4.70 4.51 4.79  HGB 13.2 12.8* 13.7  HCT 41.4 40.2 40.6  MCV 88.1 89.1 84.8  MCH 28.1 28.4 28.6  MCHC 31.9 31.8 33.7  RDW 15.4 16.0* 15.8*  PLT 249 209 242    Cardiac Enzymes Recent Labs  Lab 07/06/18 1641 07/06/18 2223  TROPONINI 0.05* 0.05*    Recent Labs  Lab 07/02/18 2150 07/03/18 0300  TROPIPOC 0.04 0.03     BNP Recent Labs  Lab 07/03/18 0215 07/06/18 1641  BNP 1,225.7* 1,662.6*     DDimer No results for input(s): DDIMER in the last 168 hours.   Radiology    Dg Chest 2 View  Result Date: 07/06/2018 CLINICAL DATA:  Shortness of breath, body aches and congestion for 4-5 days, past history of cardiomyopathy, CHF, smoking, non-Hodgkin's lymphoma EXAM: CHEST - 2 VIEW COMPARISON:  07/02/2018 FINDINGS: Enlargement of cardiac silhouette with pulmonary vascular congestion. Mediastinal contour stable. Chronic accentuation of interstitial markings unchanged likely related to minimal chronic failure. No acute superimposed pulmonary edema, segmental consolidation, pleural effusion, or pneumothorax. Bones unremarkable. IMPRESSION: Enlargement of cardiac silhouette with pulmonary vascular congestion and minimal chronic failure. No acute abnormalities. Electronically Signed   By: Lavonia Dana M.D.   On: 07/06/2018 16:37   Ct Angio Chest Pe W And/or Wo  Contrast  Result Date: 07/06/2018 CLINICAL DATA:  53 y/o M; body aches, congestion, and cough for 4-5 days. EXAM: CT ANGIOGRAPHY CHEST CT ABDOMEN AND PELVIS WITH CONTRAST TECHNIQUE: Multidetector CT imaging of the chest was performed using the standard protocol during bolus administration of intravenous contrast. Multiplanar CT image reconstructions and MIPs were obtained to evaluate the vascular anatomy. Multidetector CT imaging of the abdomen and pelvis was performed using the standard protocol during bolus administration of intravenous contrast. CONTRAST:  140 cc OMNIPAQUE IOHEXOL 350 MG/ML SOLN COMPARISON:  None. FINDINGS: CTA CHEST FINDINGS Cardiovascular: Cardiomegaly. No pericardial effusion. Normal caliber thoracic aorta. Main pulmonary artery measures 3.5 cm. Mild to moderate coronary artery calcific atherosclerosis. Satisfactory opacification of the pulmonary arteries. No pulmonary embolus identified. Mediastinum/Nodes: Mildly enlarged mediastinal lymph nodes, for example a prevascular node measuring 23 x 15 mm (series 4, image 32) and a right upper paratracheal lymph node measuring 15 x 16 mm (series 4, image 25). No axillary adenopathy. 18 mm nodule in the right lobe of the thyroid gland. Normal thoracic esophagus. Lungs/Pleura: Smooth interlobular septal thickening compatible with interstitial edema. Small right pleural effusion. No consolidation or pneumothorax. Few scattered pulmonary nodules measuring up to 6 x 4 mm, mean 5 mm in the left upper lobe (series 10, image 64) and additional smaller nodules on the right minor fissure. Musculoskeletal: No chest wall abnormality. No acute or significant osseous findings. Review of the MIP images confirms the above findings. CT ABDOMEN and PELVIS FINDINGS Hepatobiliary: No focal liver abnormality is seen. Gallbladder wall thickening. No radiopaque gallstone or biliary ductal dilatation. Pancreas: Unremarkable. No pancreatic ductal dilatation or surrounding  inflammatory changes. Spleen: Normal in size without focal abnormality. Adrenals/Urinary Tract: Indeterminate 10 mm right adrenal nodule (series 11, image 29). Multiple subcentimeter hypodensities within the renal cortices, compatible with cysts otherwise. Kidneys are normal, without renal calculi, focal lesion, or hydronephrosis. Bladder is unremarkable. Stomach/Bowel: Stomach is within normal limits. Appendix appears normal. No evidence of bowel wall thickening, distention, or inflammatory changes. Duodenum diverticula arising from the third segment of the duodenum, no associated inflammatory changes. Vascular/Lymphatic: Aortic atherosclerosis. No enlarged abdominal or pelvic lymph nodes. Reproductive: Prostate is unremarkable. Other: No abdominal wall hernia or abnormality. No abdominopelvic ascites. Musculoskeletal: No fracture is seen. Review of the MIP images confirms the above findings. IMPRESSION: CTA chest: 1. No pulmonary embolus identified. 2. Cardiomegaly, interstitial pulmonary edema, small  right pleural effusion. 3. Enlarged main pulmonary artery indicates pulmonary artery hypertension. 4. Multiple pulmonary nodules measuring up to 5 mm. No follow-up needed if patient is low-risk (and has no known or suspected primary neoplasm). Non-contrast chest CT can be considered in 12 months if patient is high-risk. This recommendation follows the consensus statement: Guidelines for Management of Incidental Pulmonary Nodules Detected on CT Images: From the Fleischner Society 2017; Radiology 2017; 284:228-243. 5. 18 mm nodule in right lobe of thyroid gland. Thyroid ultrasound is recommended on a nonemergent basis. CT abdomen and pelvis: 1. Gallbladder wall thickening. No radiopaque gallstone. No biliary ductal dilatation. Findings are nonspecific and may represent cholecystitis, reactive changes due to local inflammation, heart failure, or hypoproteinemia. Consider right upper quadrant ultrasound to assess for  radiolucent gallstones. 2. Indeterminate 10 mm right adrenal nodule. In the absence of primary malignancy this is probably benign. Consider 12 month follow-up with adrenal CT. This recommendation follows ACR consensus guidelines: Management of Incidental Adrenal Masses: A White Paper of the. Electronically Signed   By: Kristine Garbe M.D.   On: 07/06/2018 19:57   Ct Abdomen Pelvis W Contrast  Result Date: 07/06/2018 CLINICAL DATA:  53 y/o M; body aches, congestion, and cough for 4-5 days. EXAM: CT ANGIOGRAPHY CHEST CT ABDOMEN AND PELVIS WITH CONTRAST TECHNIQUE: Multidetector CT imaging of the chest was performed using the standard protocol during bolus administration of intravenous contrast. Multiplanar CT image reconstructions and MIPs were obtained to evaluate the vascular anatomy. Multidetector CT imaging of the abdomen and pelvis was performed using the standard protocol during bolus administration of intravenous contrast. CONTRAST:  140 cc OMNIPAQUE IOHEXOL 350 MG/ML SOLN COMPARISON:  None. FINDINGS: CTA CHEST FINDINGS Cardiovascular: Cardiomegaly. No pericardial effusion. Normal caliber thoracic aorta. Main pulmonary artery measures 3.5 cm. Mild to moderate coronary artery calcific atherosclerosis. Satisfactory opacification of the pulmonary arteries. No pulmonary embolus identified. Mediastinum/Nodes: Mildly enlarged mediastinal lymph nodes, for example a prevascular node measuring 23 x 15 mm (series 4, image 32) and a right upper paratracheal lymph node measuring 15 x 16 mm (series 4, image 25). No axillary adenopathy. 18 mm nodule in the right lobe of the thyroid gland. Normal thoracic esophagus. Lungs/Pleura: Smooth interlobular septal thickening compatible with interstitial edema. Small right pleural effusion. No consolidation or pneumothorax. Few scattered pulmonary nodules measuring up to 6 x 4 mm, mean 5 mm in the left upper lobe (series 10, image 64) and additional smaller nodules on the  right minor fissure. Musculoskeletal: No chest wall abnormality. No acute or significant osseous findings. Review of the MIP images confirms the above findings. CT ABDOMEN and PELVIS FINDINGS Hepatobiliary: No focal liver abnormality is seen. Gallbladder wall thickening. No radiopaque gallstone or biliary ductal dilatation. Pancreas: Unremarkable. No pancreatic ductal dilatation or surrounding inflammatory changes. Spleen: Normal in size without focal abnormality. Adrenals/Urinary Tract: Indeterminate 10 mm right adrenal nodule (series 11, image 29). Multiple subcentimeter hypodensities within the renal cortices, compatible with cysts otherwise. Kidneys are normal, without renal calculi, focal lesion, or hydronephrosis. Bladder is unremarkable. Stomach/Bowel: Stomach is within normal limits. Appendix appears normal. No evidence of bowel wall thickening, distention, or inflammatory changes. Duodenum diverticula arising from the third segment of the duodenum, no associated inflammatory changes. Vascular/Lymphatic: Aortic atherosclerosis. No enlarged abdominal or pelvic lymph nodes. Reproductive: Prostate is unremarkable. Other: No abdominal wall hernia or abnormality. No abdominopelvic ascites. Musculoskeletal: No fracture is seen. Review of the MIP images confirms the above findings. IMPRESSION: CTA chest: 1. No pulmonary embolus  identified. 2. Cardiomegaly, interstitial pulmonary edema, small right pleural effusion. 3. Enlarged main pulmonary artery indicates pulmonary artery hypertension. 4. Multiple pulmonary nodules measuring up to 5 mm. No follow-up needed if patient is low-risk (and has no known or suspected primary neoplasm). Non-contrast chest CT can be considered in 12 months if patient is high-risk. This recommendation follows the consensus statement: Guidelines for Management of Incidental Pulmonary Nodules Detected on CT Images: From the Fleischner Society 2017; Radiology 2017; 284:228-243. 5. 18 mm  nodule in right lobe of thyroid gland. Thyroid ultrasound is recommended on a nonemergent basis. CT abdomen and pelvis: 1. Gallbladder wall thickening. No radiopaque gallstone. No biliary ductal dilatation. Findings are nonspecific and may represent cholecystitis, reactive changes due to local inflammation, heart failure, or hypoproteinemia. Consider right upper quadrant ultrasound to assess for radiolucent gallstones. 2. Indeterminate 10 mm right adrenal nodule. In the absence of primary malignancy this is probably benign. Consider 12 month follow-up with adrenal CT. This recommendation follows ACR consensus guidelines: Management of Incidental Adrenal Masses: A White Paper of the. Electronically Signed   By: Kristine Garbe M.D.   On: 07/06/2018 19:57   US Abdomen Limited Ruq  Result Date: 07/06/2018 CLINICAL DATA:  Right upper quadrant tenderness. EXAM: ULTRASOUND ABDOMEN LIMITED RIGHT UPPER QUADRANT COMPARISON:  CT earlier this day. FINDINGS: Gallbladder: Physiologically distended. No gallstones or wall thickening visualized. Normal gallbladder wall thickness of 2 mm. No sonographic Murphy sign noted by sonographer. Common bile duct: Diameter: 4 mm, normal. Liver: No focal lesion identified. Diffusely increased in parenchymal echogenicity. Portal vein is patent on color Doppler imaging with normal direction of blood flow towards the liver. Right pleural effusion demonstrated.  IVC appears dilated. IMPRESSION: 1. Normal sonographic appearance of gallbladder. Gallbladder wall thickening on CT is not confirmed sonographically. 2. Hepatic steatosis. Electronically Signed   By: Keith Rake M.D.   On: 07/06/2018 22:06    Cardiac Studies   Echo 07/07/18  1. The left ventricle has severely reduced systolic function, with an ejection fraction of 20-25%. The cavity size was moderately dilated. Left ventricular diastolic Doppler parameters are consistent with restrictive filling Elevated left atrial  and  left ventricular end-diastolic pressures The E/e' is >30. Left ventricular diffuse hypokinesis.  2. The right ventricle has moderately reduced systolic function. The cavity was mildly enlarged. There is no increase in right ventricular wall thickness.  3. Left atrial size was severely dilated.  4. Right atrial size was severely dilated.  5. Moderate mitral annular dilatation.  6. The mitral valve is degenerative. Mild thickening of the mitral valve leaflet. Mitral valve regurgitation is moderate to severe by color flow Doppler. The MR jet is eccentric laterally directed.  7. Severe tricuspid stenosis.  8. The tricuspid valve is not well visualized. Tricuspid valve regurgitation is severe.  9. The aortic valve is tricuspid Mild sclerosis of the aortic valve. 10. The pulmonic valve was grossly normal. Pulmonic valve regurgitation is mild by color flow Doppler. 11. The aortic root and ascending aorta are normal in size and structure. 12. The inferior vena cava was dilated in size with <50% respiratory variability. 13. The interatrial septum was not well visualized.  Patient Profile     53 y.o. male with PMH hypertension, CAD, cardiomyopathy, chronic heart failure, non-Hodgkins lymphoma, substance abuse (no records available, as these were established outside the [facility] system) presented with atypical chest pain, shortness of breath, and nausea  Assessment & Plan    Acute on chronic systolic and diastolic  heart failure: EF 20-25% with grade 3 diastolic dysfunction. On review of the echo results with him, it sounds as though this is similar function to what he has been told in the past. I do not have any prior records to compare. I did query the information I had in [place] but was unable to locate any information.  -chronic NYHA class 3 symptoms, can walk less than a block but no symptoms as rest -BNP >1600 on admission -has a history of substance abuse and admits to relapse two weeks  ago -reports chronic medications of lasix 80 mg daily, lisinopril 20 mg, metoprolol XL 25 mg daily -suspect acute exacerbation due to being out of medications -negative 2.6 L with additional output not captured, though weight is up? Unclear if this is accurate. Appears nearly euvolemic -K 3.1 today, would supplement. Mg also 1.5, would supplement -currently on carvedilol 6.25 mg BID, lasix 80 IV BID, bidil 20-37.5 TID -would keep carvedilol over metoprolol given risk of pure beta blocker with cocaine use (endorses relapse) -Cr 1.53 today, up slightly. Will change to oral dosing of lasix. -would continue medical therapy for three months and recheck echo at that time  CAD s/p MI in 2014 in [place] and 2016 in [place]: I do not have records regarding anatomy or stents. His presenting chest pain is atypical in nature and not like prior MI.  -troponins flat, not indicative of acute coronary event -continue aspirin 81 mg daily for history of CAD Continue atorvastatin 80 mg daily, LDL goal <70 (was out of meds) -Would recommend against triptans (outpatient med) given known CAD  For questions or updates, please contact [facility] Please consult [email or web address] for contact info under     Signed, Buford Dresser, MD  07/08/2018, 11:44 AM

## 2018-07-08 NOTE — Progress Notes (Signed)
PROGRESS NOTE    Cody Guzman  EGB:[id number]  DOB: 1965/07/23  DOA: 07/06/2018 PCP: Antony Blackbird, MD  Brief Narrative: 53 year old male with history of hypertension, CAD status post MI, combined CHF, non-Hodgkin's lymphoma in remission since 1980, chronic kidney disease stage II and tobacco/cocaine abuse who was not able to take medications (Lasix 80 mg, lisinopril 20 mg and metoprolol 25 mg) for at least a week (as apparently his landlord kicked him out of his house) presented with complaints of shortness of breath and chest pain.  Troponin flat at 0.05.  BNP greater than 1600 on admission.  Echo showed EF of 20 to 25% with severe tricuspid stenosis, moderate to severe MR and moderate right heart failure.Patient admitted for CHF exacerbation with cardiology consultation.  He has been receiving IV diuresis during the hospital course.    Subjective:   Patient sleeping comfortably and talking on the phone.  Family member and service dog bedside.  Patient states his leg swellings and dyspnea are much improved.   Objective: Vitals:   07/07/18 1922 07/07/18 2310 07/08/18 0308 07/08/18 0810  BP: 104/73 98/70 121/86 115/84  Pulse: 75 75 73 69  Resp: 17 (!) 28 (!) 27 19  Temp: 97.7 F (36.5 C) 99 F (37.2 C) 99 F (37.2 C) 98.6 F (37 C)  TempSrc: Oral Oral Oral Oral  SpO2: 98% 95% 98% 97%  Weight:   97.2 kg   Height:        Intake/Output Summary (Last 24 hours) at 07/08/2018 1505 Last data filed at 07/08/2018 1040 Gross per 24 hour  Intake 1080 ml  Output 1600 ml  Net -520 ml   Filed Weights   07/06/18 2349 07/07/18 0500 07/08/18 0308  Weight: 95.5 kg 95.5 kg 97.2 kg    Physical Examination:  General exam: Appears calm and comfortable  Respiratory system: Clear to auscultation. Respiratory effort normal. Cardiovascular system: S1 & S2 heard, systolic murmur at apex, no rubs, gallops or clicks. Trace pedal edema. Gastrointestinal system: Abdomen is mildly  distended  (although patient reports this is improved), soft and mild diffuse tenderness to deep palpation. No organomegaly or masses felt. Normal bowel sounds heard. Central nervous system: Alert and oriented. No focal neurological deficits. Extremities: Symmetric 5 x 5 power. Skin: No rashes, lesions or ulcers Psychiatry: Judgement and insight appear normal. Mood & affect appropriate.     Data Reviewed: I have personally reviewed following labs and imaging studies  CBC: Recent Labs  Lab 07/02/18 2148 07/06/18 1641 07/08/18 0253  WBC 6.5 6.6 6.1  NEUTROABS  --   --  3.0  HGB 13.2 12.8* 13.7  HCT 41.4 40.2 40.6  MCV 88.1 89.1 84.8  PLT 249 209 371   Basic Metabolic Panel: Recent Labs  Lab 07/02/18 2148 07/06/18 1641 07/08/18 0253  NA 139 139 138  K 3.3* 3.2* 3.1*  CL 108 107 98  CO2 21* 25 26  GLUCOSE 88 120* 103*  BUN 15 13 10   CREATININE 1.32* 1.34* 1.53*  CALCIUM 9.3 9.1 8.8*  MG  --   --  1.5*  PHOS  --   --  3.8   GFR: Estimated Creatinine Clearance: 62.4 mL/min (A) (by C-G formula based on SCr of 1.53 mg/dL (H)). Liver Function Tests: Recent Labs  Lab 07/06/18 1651 07/08/18 0253  AST 20  --   ALT 14  --   ALKPHOS 71  --   BILITOT 1.7*  --   PROT 7.3  --  ALBUMIN 4.0 3.4*   Recent Labs  Lab 07/06/18 1651  LIPASE 32   No results for input(s): AMMONIA in the last 168 hours. Coagulation Profile: No results for input(s): INR, PROTIME in the last 168 hours. Cardiac Enzymes: Recent Labs  Lab 07/06/18 1641 07/06/18 2223  TROPONINI 0.05* 0.05*   BNP (last 3 results) No results for input(s): PROBNP in the last 8760 hours. HbA1C: No results for input(s): HGBA1C in the last 72 hours. CBG: No results for input(s): GLUCAP in the last 168 hours. Lipid Profile: No results for input(s): CHOL, HDL, LDLCALC, TRIG, CHOLHDL, LDLDIRECT in the last 72 hours. Thyroid Function Tests: No results for input(s): TSH, T4TOTAL, FREET4, T3FREE, THYROIDAB in the last 72  hours. Anemia Panel: No results for input(s): VITAMINB12, FOLATE, FERRITIN, TIBC, IRON, RETICCTPCT in the last 72 hours. Sepsis Labs: No results for input(s): PROCALCITON, LATICACIDVEN in the last 168 hours.  Recent Results (from the past 240 hour(s))  MRSA PCR Screening     Status: None   Collection Time: 07/06/18 11:49 PM  Result Value Ref Range Status   MRSA by PCR NEGATIVE NEGATIVE Final    Comment:        The GeneXpert MRSA Assay (FDA approved for NASAL specimens only), is one component of a comprehensive MRSA colonization surveillance program. It is not intended to diagnose MRSA infection nor to guide or monitor treatment for MRSA infections. Performed at [facility], [place] [address], [place], [place] [address]       Radiology Studies: Dg Chest 2 View  Result Date: 07/06/2018 CLINICAL DATA:  Shortness of breath, body aches and congestion for 4-5 days, past history of cardiomyopathy, CHF, smoking, non-Hodgkin's lymphoma EXAM: CHEST - 2 VIEW COMPARISON:  07/02/2018 FINDINGS: Enlargement of cardiac silhouette with pulmonary vascular congestion. Mediastinal contour stable. Chronic accentuation of interstitial markings unchanged likely related to minimal chronic failure. No acute superimposed pulmonary edema, segmental consolidation, pleural effusion, or pneumothorax. Bones unremarkable. IMPRESSION: Enlargement of cardiac silhouette with pulmonary vascular congestion and minimal chronic failure. No acute abnormalities. Electronically Signed   By: Lavonia Dana M.D.   On: 07/06/2018 16:37   Ct Angio Chest Pe W And/or Wo Contrast  Result Date: 07/06/2018 CLINICAL DATA:  53 y/o M; body aches, congestion, and cough for 4-5 days. EXAM: CT ANGIOGRAPHY CHEST CT ABDOMEN AND PELVIS WITH CONTRAST TECHNIQUE: Multidetector CT imaging of the chest was performed using the standard protocol during bolus administration of intravenous contrast. Multiplanar CT image reconstructions and MIPs were  obtained to evaluate the vascular anatomy. Multidetector CT imaging of the abdomen and pelvis was performed using the standard protocol during bolus administration of intravenous contrast. CONTRAST:  140 cc OMNIPAQUE IOHEXOL 350 MG/ML SOLN COMPARISON:  None. FINDINGS: CTA CHEST FINDINGS Cardiovascular: Cardiomegaly. No pericardial effusion. Normal caliber thoracic aorta. Main pulmonary artery measures 3.5 cm. Mild to moderate coronary artery calcific atherosclerosis. Satisfactory opacification of the pulmonary arteries. No pulmonary embolus identified. Mediastinum/Nodes: Mildly enlarged mediastinal lymph nodes, for example a prevascular node measuring 23 x 15 mm (series 4, image 32) and a right upper paratracheal lymph node measuring 15 x 16 mm (series 4, image 25). No axillary adenopathy. 18 mm nodule in the right lobe of the thyroid gland. Normal thoracic esophagus. Lungs/Pleura: Smooth interlobular septal thickening compatible with interstitial edema. Small right pleural effusion. No consolidation or pneumothorax. Few scattered pulmonary nodules measuring up to 6 x 4 mm, mean 5 mm in the left upper lobe (series 10, image 64)  and additional smaller nodules on the right minor fissure. Musculoskeletal: No chest wall abnormality. No acute or significant osseous findings. Review of the MIP images confirms the above findings. CT ABDOMEN and PELVIS FINDINGS Hepatobiliary: No focal liver abnormality is seen. Gallbladder wall thickening. No radiopaque gallstone or biliary ductal dilatation. Pancreas: Unremarkable. No pancreatic ductal dilatation or surrounding inflammatory changes. Spleen: Normal in size without focal abnormality. Adrenals/Urinary Tract: Indeterminate 10 mm right adrenal nodule (series 11, image 29). Multiple subcentimeter hypodensities within the renal cortices, compatible with cysts otherwise. Kidneys are normal, without renal calculi, focal lesion, or hydronephrosis. Bladder is unremarkable.  Stomach/Bowel: Stomach is within normal limits. Appendix appears normal. No evidence of bowel wall thickening, distention, or inflammatory changes. Duodenum diverticula arising from the third segment of the duodenum, no associated inflammatory changes. Vascular/Lymphatic: Aortic atherosclerosis. No enlarged abdominal or pelvic lymph nodes. Reproductive: Prostate is unremarkable. Other: No abdominal wall hernia or abnormality. No abdominopelvic ascites. Musculoskeletal: No fracture is seen. Review of the MIP images confirms the above findings. IMPRESSION: CTA chest: 1. No pulmonary embolus identified. 2. Cardiomegaly, interstitial pulmonary edema, small right pleural effusion. 3. Enlarged main pulmonary artery indicates pulmonary artery hypertension. 4. Multiple pulmonary nodules measuring up to 5 mm. No follow-up needed if patient is low-risk (and has no known or suspected primary neoplasm). Non-contrast chest CT can be considered in 12 months if patient is high-risk. This recommendation follows the consensus statement: Guidelines for Management of Incidental Pulmonary Nodules Detected on CT Images: From the Fleischner Society 2017; Radiology 2017; 284:228-243. 5. 18 mm nodule in right lobe of thyroid gland. Thyroid ultrasound is recommended on a nonemergent basis. CT abdomen and pelvis: 1. Gallbladder wall thickening. No radiopaque gallstone. No biliary ductal dilatation. Findings are nonspecific and may represent cholecystitis, reactive changes due to local inflammation, heart failure, or hypoproteinemia. Consider right upper quadrant ultrasound to assess for radiolucent gallstones. 2. Indeterminate 10 mm right adrenal nodule. In the absence of primary malignancy this is probably benign. Consider 12 month follow-up with adrenal CT. This recommendation follows ACR consensus guidelines: Management of Incidental Adrenal Masses: A White Paper of the. Electronically Signed   By: Kristine Garbe M.D.   On:  07/06/2018 19:57   Ct Abdomen Pelvis W Contrast  Result Date: 07/06/2018 CLINICAL DATA:  53 y/o M; body aches, congestion, and cough for 4-5 days. EXAM: CT ANGIOGRAPHY CHEST CT ABDOMEN AND PELVIS WITH CONTRAST TECHNIQUE: Multidetector CT imaging of the chest was performed using the standard protocol during bolus administration of intravenous contrast. Multiplanar CT image reconstructions and MIPs were obtained to evaluate the vascular anatomy. Multidetector CT imaging of the abdomen and pelvis was performed using the standard protocol during bolus administration of intravenous contrast. CONTRAST:  140 cc OMNIPAQUE IOHEXOL 350 MG/ML SOLN COMPARISON:  None. FINDINGS: CTA CHEST FINDINGS Cardiovascular: Cardiomegaly. No pericardial effusion. Normal caliber thoracic aorta. Main pulmonary artery measures 3.5 cm. Mild to moderate coronary artery calcific atherosclerosis. Satisfactory opacification of the pulmonary arteries. No pulmonary embolus identified. Mediastinum/Nodes: Mildly enlarged mediastinal lymph nodes, for example a prevascular node measuring 23 x 15 mm (series 4, image 32) and a right upper paratracheal lymph node measuring 15 x 16 mm (series 4, image 25). No axillary adenopathy. 18 mm nodule in the right lobe of the thyroid gland. Normal thoracic esophagus. Lungs/Pleura: Smooth interlobular septal thickening compatible with interstitial edema. Small right pleural effusion. No consolidation or pneumothorax. Few scattered pulmonary nodules measuring up to 6 x 4 mm, mean 5 mm in the  left upper lobe (series 10, image 64) and additional smaller nodules on the right minor fissure. Musculoskeletal: No chest wall abnormality. No acute or significant osseous findings. Review of the MIP images confirms the above findings. CT ABDOMEN and PELVIS FINDINGS Hepatobiliary: No focal liver abnormality is seen. Gallbladder wall thickening. No radiopaque gallstone or biliary ductal dilatation. Pancreas: Unremarkable. No  pancreatic ductal dilatation or surrounding inflammatory changes. Spleen: Normal in size without focal abnormality. Adrenals/Urinary Tract: Indeterminate 10 mm right adrenal nodule (series 11, image 29). Multiple subcentimeter hypodensities within the renal cortices, compatible with cysts otherwise. Kidneys are normal, without renal calculi, focal lesion, or hydronephrosis. Bladder is unremarkable. Stomach/Bowel: Stomach is within normal limits. Appendix appears normal. No evidence of bowel wall thickening, distention, or inflammatory changes. Duodenum diverticula arising from the third segment of the duodenum, no associated inflammatory changes. Vascular/Lymphatic: Aortic atherosclerosis. No enlarged abdominal or pelvic lymph nodes. Reproductive: Prostate is unremarkable. Other: No abdominal wall hernia or abnormality. No abdominopelvic ascites. Musculoskeletal: No fracture is seen. Review of the MIP images confirms the above findings. IMPRESSION: CTA chest: 1. No pulmonary embolus identified. 2. Cardiomegaly, interstitial pulmonary edema, small right pleural effusion. 3. Enlarged main pulmonary artery indicates pulmonary artery hypertension. 4. Multiple pulmonary nodules measuring up to 5 mm. No follow-up needed if patient is low-risk (and has no known or suspected primary neoplasm). Non-contrast chest CT can be considered in 12 months if patient is high-risk. This recommendation follows the consensus statement: Guidelines for Management of Incidental Pulmonary Nodules Detected on CT Images: From the Fleischner Society 2017; Radiology 2017; 284:228-243. 5. 18 mm nodule in right lobe of thyroid gland. Thyroid ultrasound is recommended on a nonemergent basis. CT abdomen and pelvis: 1. Gallbladder wall thickening. No radiopaque gallstone. No biliary ductal dilatation. Findings are nonspecific and may represent cholecystitis, reactive changes due to local inflammation, heart failure, or hypoproteinemia. Consider right  upper quadrant ultrasound to assess for radiolucent gallstones. 2. Indeterminate 10 mm right adrenal nodule. In the absence of primary malignancy this is probably benign. Consider 12 month follow-up with adrenal CT. This recommendation follows ACR consensus guidelines: Management of Incidental Adrenal Masses: A White Paper of the. Electronically Signed   By: Kristine Garbe M.D.   On: 07/06/2018 19:57   US Abdomen Limited Ruq  Result Date: 07/06/2018 CLINICAL DATA:  Right upper quadrant tenderness. EXAM: ULTRASOUND ABDOMEN LIMITED RIGHT UPPER QUADRANT COMPARISON:  CT earlier this day. FINDINGS: Gallbladder: Physiologically distended. No gallstones or wall thickening visualized. Normal gallbladder wall thickness of 2 mm. No sonographic Murphy sign noted by sonographer. Common bile duct: Diameter: 4 mm, normal. Liver: No focal lesion identified. Diffusely increased in parenchymal echogenicity. Portal vein is patent on color Doppler imaging with normal direction of blood flow towards the liver. Right pleural effusion demonstrated.  IVC appears dilated. IMPRESSION: 1. Normal sonographic appearance of gallbladder. Gallbladder wall thickening on CT is not confirmed sonographically. 2. Hepatic steatosis. Electronically Signed   By: Keith Rake M.D.   On: 07/06/2018 22:06        Scheduled Meds: . aspirin  81 mg Oral Daily  . atorvastatin  80 mg Oral Daily  . carvedilol  6.25 mg Oral BID WC  . enoxaparin (LOVENOX) injection  40 mg Subcutaneous Daily  . furosemide  80 mg Intravenous Q12H  . isosorbide-hydrALAZINE  1 tablet Oral TID  . nicotine  21 mg Transdermal Daily  . sodium chloride flush  3 mL Intravenous Q12H   Continuous Infusions: . sodium chloride  Assessment & Plan:    1.  Acute on chronic biventricular systolic heart failure: Weight appears stable around 97 kg and I's and O's showing net out.  Patient reports improvement in leg swellings.  Although he appears to have  some abdominal distention, overall diuresing well.  Given improved clinical status and rising creatinine, will reduce Lasix to 40 mg IV twice daily.  Can likely transition to oral diuretics in next 24 to 48 hours.Continue BiDil and Coreg.  Continue to monitor daily weights/input and output.  Saturating well on room air.  Echo shows low EF at 20 to 25%, not sure how far he is from baseline.  He also has significant valvular disease.  Will need outpatient follow-up.  2.CAD status post MI: Continue aspirin, statins and beta-blockers.  Cardiology recommends Coreg over metoprolol given history of cocaine use.  Avoid triptans per cardiology.  3.  AKI on CKD stage 2 : Creatinine upto 1.53 today.  Decrease IV Lasix for tonight and transition to oral in a.m.  Patient also received contrasted CT imaging on March 3.  4.  Hypokalemia/hypomagnesemia: Replace  5.  Abdominal distention/pain: Patient states improving compared to admission.  He did undergo work-up in this hospitalization including CT abdomen pelvis showing gallbladder thickening but normal gallbladder appearance on ultrasonogram.  No significant ascites noted on ultrasonogram or CT.   6.Incidentaloma/lung nodules: Will need outpatient follow up.Cortisol level in am  7.Tobacco/cocaine use: Counseled regarding risks.  Continue nicotine patch.  Beta-blocker choice as described above.  DVT prophylaxis: Lovenox Code Status: Full code Family / Patient Communication: Discussed with patient and family bedside Disposition Plan: Social work evaluation for homelessness.  May need medication assistance prior to discharge.     LOS: 2 days    Time spent: 35 minutes    Guilford Shi, MD [facility] Pager [phone]  If 7PM-7AM, please contact night-coverage [email or web address] Password [email or web address] 07/08/2018, 3:05 PM
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
## 2018-07-09 ENCOUNTER — Telehealth: Payer: Self-pay | Admitting: Adult Health

## 2018-07-09 DIAGNOSIS — I2510 Atherosclerotic heart disease of native coronary artery without angina pectoris: Secondary | ICD-10-CM

## 2018-07-09 LAB — ECHOCARDIOGRAM COMPLETE
Height: 68 in
WEIGHTICAEL: 3368.63 [oz_av]

## 2018-07-09 LAB — BASIC METABOLIC PANEL
ANION GAP: 11 (ref 5–15)
BUN: 16 mg/dL (ref 6–20)
CO2: 27 mmol/L (ref 22–32)
Calcium: 9.3 mg/dL (ref 8.9–10.3)
Chloride: 98 mmol/L (ref 98–111)
Creatinine, Ser: 1.49 mg/dL — ABNORMAL HIGH (ref 0.61–1.24)
GFR, EST NON AFRICAN AMERICAN: 52 mL/min — AB (ref >60)
Glucose, Bld: 112 mg/dL — ABNORMAL HIGH (ref 70–99)
Potassium: 3.3 mmol/L — ABNORMAL LOW (ref 3.5–5.1)
Sodium: 136 mmol/L (ref 135–145)

## 2018-07-09 LAB — MAGNESIUM: Magnesium: 1.7 mg/dL (ref 1.7–2.4)

## 2018-07-09 LAB — CORTISOL: Cortisol, Plasma: 8.8 ug/dL

## 2018-07-09 MED ORDER — SENNOSIDES-DOCUSATE SODIUM 8.6-50 MG PO TABS
1.0000 | ORAL_TABLET | Freq: Two times a day (BID) | ORAL | Status: DC
  Administered 2018-07-10: 1 via ORAL
  Filled 2018-07-09: qty 1

## 2018-07-09 MED ORDER — BISACODYL 10 MG RE SUPP: 10.0000 mg | Freq: Once | RECTAL | Status: DC

## 2018-07-09 MED ORDER — SORBITOL 70 % SOLN
30.0000 mL | Freq: Once | Status: AC
  Administered 2018-07-09: 30 mL via ORAL
  Filled 2018-07-09: qty 30

## 2018-07-09 MED ORDER — FUROSEMIDE 40 MG PO TABS
40.0000 mg | ORAL_TABLET | Freq: Every day | ORAL | Status: DC
  Administered 2018-07-10: 40 mg via ORAL
  Filled 2018-07-09: qty 1

## 2018-07-09 MED ORDER — POTASSIUM CHLORIDE CRYS ER 20 MEQ PO TBCR
40.0000 meq | EXTENDED_RELEASE_TABLET | Freq: Once | ORAL | Status: AC
  Administered 2018-07-09: 40 meq via ORAL
  Filled 2018-07-09: qty 2

## 2018-07-09 MED ORDER — SPIRONOLACTONE 12.5 MG HALF TABLET
12.5000 mg | ORAL_TABLET | Freq: Every day | ORAL | Status: DC
  Administered 2018-07-09 – 2018-07-10 (×2): 12.5 mg via ORAL
  Filled 2018-07-09 (×2): qty 1

## 2018-07-09 MED ORDER — MAGNESIUM CITRATE PO SOLN
1.0000 | Freq: Once | ORAL | Status: DC
  Filled 2018-07-09: qty 296

## 2018-07-09 MED ORDER — MAGNESIUM SULFATE 4 GM/100ML IV SOLN
4.0000 g | Freq: Once | INTRAVENOUS | Status: AC
  Administered 2018-07-09: 4 g via INTRAVENOUS
  Filled 2018-07-09: qty 100

## 2018-07-09 NOTE — Progress Notes (Signed)
Progress Note  Patient Name: Cody Guzman Date of Encounter: 07/09/2018  Primary Cardiologist: New--Dr. Harrell Gave  Subjective   Doing well. Reviewed medications. Has questions on FL2(?) form for assistance with housing. Reviewed heart failure management and education.  Inpatient Medications    Scheduled Meds: . aspirin  81 mg Oral Daily  . atorvastatin  80 mg Oral Daily  . carvedilol  6.25 mg Oral BID WC  . enoxaparin (LOVENOX) injection  40 mg Subcutaneous Daily  . furosemide  40 mg Oral BID  . isosorbide-hydrALAZINE  1 tablet Oral TID  . nicotine  21 mg Transdermal Daily  . sodium chloride flush  3 mL Intravenous Q12H  . sorbitol  30 mL Oral Once   Continuous Infusions: . sodium chloride    . magnesium sulfate 1 - 4 g bolus IVPB     PRN Meds: sodium chloride, acetaminophen, albuterol, dextromethorphan-guaiFENesin, morphine injection, nitroGLYCERIN, ondansetron (ZOFRAN) IV, sodium chloride flush   Vital Signs    Vitals:   07/08/18 2333 07/09/18 0323 07/09/18 0515 07/09/18 0815  BP: 100/69 101/72  112/75  Pulse: 70 70  72  Resp: 18 (!) 23  (!) 32  Temp: 98.5 F (36.9 C) 98.1 F (36.7 C)  98.1 F (36.7 C)  TempSrc: Oral Oral  Oral  SpO2: 97% 94%  99%  Weight:   89.9 kg   Height:        Intake/Output Summary (Last 24 hours) at 07/09/2018 1005 Last data filed at 07/09/2018 0900 Gross per 24 hour  Intake 243 ml  Output 1950 ml  Net -1707 ml   Last 3 Weights 07/09/2018 07/08/2018 07/07/2018  Weight (lbs) 198 lb 3.1 oz 214 lb 4.6 oz 210 lb 8.6 oz  Weight (kg) 89.9 kg 97.2 kg 95.5 kg      Telemetry    SR with rare PVCs - Personally Reviewed  ECG    SR, PVC, LAD - Personally Reviewed  Physical Exam   GEN: No acute distress.   Neck: No JVD at 30 degrees Cardiac: RRR, no rubs, or gallops. 2/6 HSM. Respiratory: Clear to auscultation bilaterally. GI: Soft, nontender, non-distended  MS: No edema; No deformity. Neuro:  Nonfocal  Psych: Normal affect    Labs    Chemistry Recent Labs  Lab 07/06/18 1641 07/06/18 1651 07/08/18 0253 07/09/18 0207  NA 139  --  138 136  K 3.2*  --  3.1* 3.3*  CL 107  --  98 98  CO2 25  --  26 27  GLUCOSE 120*  --  103* 112*  BUN 13  --  10 16  CREATININE 1.34*  --  1.53* 1.49*  CALCIUM 9.1  --  8.8* 9.3  PROT  --  7.3  --   --   ALBUMIN  --  4.0 3.4*  --   AST  --  20  --   --   ALT  --  14  --   --   ALKPHOS  --  71  --   --   BILITOT  --  1.7*  --   --   GFRNONAA 60*  --  51* 52*  GFRAA >60  --  59* >60  ANIONGAP 7  --  14 11     Hematology Recent Labs  Lab 07/02/18 2148 07/06/18 1641 07/08/18 0253  WBC 6.5 6.6 6.1  RBC 4.70 4.51 4.79  HGB 13.2 12.8* 13.7  HCT 41.4 40.2 40.6  MCV 88.1 89.1 84.8  MCH  28.1 28.4 28.6  MCHC 31.9 31.8 33.7  RDW 15.4 16.0* 15.8*  PLT 249 209 242    Cardiac Enzymes Recent Labs  Lab 07/06/18 1641 07/06/18 2223  TROPONINI 0.05* 0.05*    Recent Labs  Lab 07/02/18 2150 07/03/18 0300  TROPIPOC 0.04 0.03     BNP Recent Labs  Lab 07/03/18 0215 07/06/18 1641  BNP 1,225.7* 1,662.6*     DDimer No results for input(s): DDIMER in the last 168 hours.   Radiology    No results found.  Cardiac Studies   Echo 07/07/18  1. The left ventricle has severely reduced systolic function, with an ejection fraction of 20-25%. The cavity size was moderately dilated. Left ventricular diastolic Doppler parameters are consistent with restrictive filling Elevated left atrial and  left ventricular end-diastolic pressures The E/e' is >30. Left ventricular diffuse hypokinesis.  2. The right ventricle has moderately reduced systolic function. The cavity was mildly enlarged. There is no increase in right ventricular wall thickness.  3. Left atrial size was severely dilated.  4. Right atrial size was severely dilated.  5. Moderate mitral annular dilatation.  6. The mitral valve is degenerative. Mild thickening of the mitral valve leaflet. Mitral valve  regurgitation is moderate to severe by color flow Doppler. The MR jet is eccentric laterally directed.  7. Severe tricuspid stenosis.This is erroneous, there is no tricuspid stenosis  8. The tricuspid valve is not well visualized. Tricuspid valve regurgitation is severe.  9. The aortic valve is tricuspid Mild sclerosis of the aortic valve. 10. The pulmonic valve was grossly normal. Pulmonic valve regurgitation is mild by color flow Doppler. 11. The aortic root and ascending aorta are normal in size and structure. 12. The inferior vena cava was dilated in size with <50% respiratory variability. 13. The interatrial septum was not well visualized.  Patient Profile     53 y.o. male with PMH hypertension, CAD, cardiomyopathy, chronic heart failure, non-Hodgkins lymphoma, substance abuse (no records available, as these were established outside the [facility] system) presented with atypical chest pain, shortness of breath, and nausea  Assessment & Plan    Acute on chronic systolic and diastolic heart failure: EF 20-25% with grade 3 diastolic dysfunction. On review of the echo results with him, it sounds as though this is similar function to what he has been told in the past. I do not have any prior records to compare. I did query the information I had in [place] but was unable to locate any information.  -chronic NYHA class 3 symptoms, can walk less than a block but no symptoms as rest -BNP >1600 on admission -has a history of substance abuse and admits to relapse two weeks ago -reports chronic medications of lasix 80 mg daily, lisinopril 20 mg, metoprolol XL 25 mg daily -suspect acute exacerbation due to being out of medications -negative 3.6 L with additional output not captured. Weight 89.9 kg from 95.5 kg. -K 3.3 today, would supplement.  -currently on carvedilol 6.25 mg BID, lasix 40 oral BID, bidil 20-37.5 TID -will change to daily lasix (from BID) and add spironolactone today to aid in  diuresis but also maintain potassium -would keep carvedilol over metoprolol given risk of pure beta blocker with cocaine use (endorses relapse) -Cr 1.49 today. -would continue medical therapy for three months and recheck echo at that time before discussing ICD --of note, there is no tricuspid stenosis--the report is wrong (it is severe TR). We are working to get the report amended.  CAD s/p MI in 2014 in [place] and 2016 in [place]: I do not have records regarding anatomy or stents. His presenting chest pain is atypical in nature and not like prior MI.  -troponins flat, not indicative of acute coronary event -continue aspirin 81 mg daily for history of CAD Continue atorvastatin 80 mg daily, LDL goal <70 (was out of meds) -Would recommend against triptans (outpatient med) given known CAD  [facility] will sign off.   Medication Recommendations:  Carvedilol 6.25 mg BID, lasix 40 PO daily, spironolactone 12.5 mg daily, bidil 20-37.5 TID. Aspirin 81 mg daily and atorvastatin 80 mg daily. Other recommendations (labs, testing, etc):  bmet in 1 week Follow up as an outpatient:  He will see Jory Sims in our office on 3/17.  For questions or updates, please contact [place] Please consult [email or web address] for contact info under     Signed, Buford Dresser, MD  07/09/2018, 10:05 AM

## 2018-07-09 NOTE — Telephone Encounter (Signed)
Admitted

## 2018-07-09 NOTE — Care Management Important Message (Signed)
Important Message  Patient Details  Name: Cody Guzman MRN: [id number] Date of Birth: 1965-06-28   Medicare Important Message Given:  Yes    [name] Holly 07/09/2018, 11:08 AM

## 2018-07-09 NOTE — Progress Notes (Signed)
PROGRESS NOTE    Cody Guzman  HCW:[id number] DOB: 1965-08-28 DOA: 07/06/2018 PCP: Antony Blackbird, MD    Brief Narrative:  53 year old male with history of hypertension, CAD status post MI, combined CHF, non-Hodgkin's lymphoma in remission since 1980, chronic kidney disease stage II and tobacco/cocaine abuse who was not able to take medications (Lasix 80 mg, lisinopril 20 mg and metoprolol 25 mg) for at least a week (as apparently his landlord kicked him out of his house) presented with complaints of shortness of breath and chest pain.  Troponin flat at 0.05.  BNP greater than 1600 on admission.  Echo showed EF of 20 to 25% with severe tricuspid stenosis, moderate to severe MR and moderate right heart failure.Patient admitted for CHF exacerbation with cardiology consultation.  He has been receiving IV diuresis during the hospital course.    Assessment & Plan:   Principal Problem:   CHF exacerbation ([place]) Active Problems:   Coronary artery disease   Mild intermittent asthma   Chronic kidney disease (CKD), stage II (mild)   HLD (hyperlipidemia)   Hypokalemia   Chest pain   Elevated troponin   Lung nodule   Thyroid nodule   Adrenal nodule ([facility])   RUQ abdominal pain  1 acute on chronic biventricular systolic heart failure Likely secondary to inability to get his medication for at least a week as he was kicked out by his landlord from his home.  Patient had presented with some chest pain which seems to be improving.  Cardiac enzymes were flattened.  BNP on admission was greater than 1600.  2D echo done with a EF of 20 to 25% with severe tricuspid stenosis, moderate to severe MR and moderate right heart failure.  Patient improving clinically.  Patient with a urine output of 1.575 L over the past 24 hours.  Current weight is 198.19 pounds from 214 pounds on admission.  Patient has been transitioned to oral Lasix today at 40 mg twice daily.  Continue BiDil, Coreg, Lipitor, aspirin.   Cardiology recommending medical therapy for at least 3 months with repeat 2D echo at that time.  Cardiology following and appreciate input and recommendations.  2.  Chest pain/history of coronary artery disease status post MI 2014 in [place] in 2016 in [place] Patient's chest pain likely secondary to problem #1.  Cardiac enzymes were flat.  2D echo with a EF of 20 to 25% with severe tricuspid stenosis, moderate to severe MR and moderate right heart failure.  Continue current regimen of aspirin, statin, beta-blocker, BiDil, Lasix.  Cardiology recommending against triptans.  Cardiology following and appreciate input and recommendations.  3.  Hypokalemia Secondary to diuresis.  Check a magnesium level.  Replete.  4.  Right upper quadrant abdominal pain Questionable etiology.  Improving clinically.  Patient also states has not had a bowel movement prior to admission.  CT abdomen and pelvis showed gallbladder wall thickening.  Right upper quadrant ultrasound with hepatic steatosis with a normal gallbladder and no gallbladder stones.  Pain may also be a component of patient's heart failure.  We will give a dose of sorbitol.  Follow.  5.  Pulmonary nodules We will need outpatient repeat CT chest with noncontrasted study in 12 months for follow-up.  6.  Indeterminate 10 mm right adrenal nodule Will need adrenal CT scan in 12 months for follow-up.  Cortisol level at 8.8 this morning.  Outpatient follow-up.  7.  Acute kidney injury on chronic kidney disease stage II Creatinine went up to 1.53.  Bump in creatinine felt to be secondary to diuresis.  Dose of IV Lasix decreased and patient transition to oral Lasix.  Creatinine this morning with no significant change at 1.49.  Avoid nephrotoxins.  Follow.  8.  Tobacco abuse/cocaine use Counseled on cessation and risks.  Continue nicotine patch.  Beta-blocker choice of Coreg per cardiology due to history of cocaine use.  9.  Thyroid nodule We will need  outpatient follow-up with further work-up with thyroid ultrasound which can be done in the outpatient setting.  10.  Homelessness Social work consulted.   DVT prophylaxis: Lovenox Code Status: Full Family Communication: Updated patient.  No family at bedside. Disposition Plan: Currently on telemetry.  Patient currently homeless Case manager/social worker following.  Likely medically stable hopefully in the next 24 to 48 hours and per cardiology.   Consultants:   Cardiology: Dr. Kalman Shan 07/07/2018  Procedures:   CT angiogram chest 07/06/2018  CT abdomen and pelvis 07/06/2018  Chest x-ray 07/06/2018  Right upper quadrant ultrasound 07/06/2018  Antimicrobials:   None   Subjective: Patient sleeping however easily arousable.  Service dog at bedside.  Patient states improvement with shortness of breath.  Patient states improvement with chest pain.  Abdominal pain improving however still with right upper quadrant abdominal pain.  Patient stated has not had a bowel movement since admission.  Tolerating current diet.  Objective: Vitals:   07/08/18 2333 07/09/18 0323 07/09/18 0515 07/09/18 0815  BP: 100/69 101/72  112/75  Pulse: 70 70  72  Resp: 18 (!) 23  (!) 32  Temp: 98.5 F (36.9 C) 98.1 F (36.7 C)  98.1 F (36.7 C)  TempSrc: Oral Oral  Oral  SpO2: 97% 94%  99%  Weight:   89.9 kg   Height:        Intake/Output Summary (Last 24 hours) at 07/09/2018 0958 Last data filed at 07/09/2018 0800 Gross per 24 hour  Intake 3 ml  Output 1950 ml  Net -1947 ml   Filed Weights   07/07/18 0500 07/08/18 0308 07/09/18 0515  Weight: 95.5 kg 97.2 kg 89.9 kg    Examination:  General exam: Appears calm and comfortable  Respiratory system: Clear to auscultation. Respiratory effort normal. Cardiovascular system: S1 & S2 heard, RRR. No JVD, murmurs, rubs, gallops or clicks. No pedal edema. Gastrointestinal system: Abdomen is nondistended, soft and some tenderness to palpation right upper  quadrant.  No rebound.  No guarding.  Normal bowel sounds.  Central nervous system: Alert and oriented. No focal neurological deficits. Extremities: Symmetric 5 x 5 power. Skin: No rashes, lesions or ulcers Psychiatry: Judgement and insight appear normal. Mood & affect appropriate.     Data Reviewed: I have personally reviewed following labs and imaging studies  CBC: Recent Labs  Lab 07/02/18 2148 07/06/18 1641 07/08/18 0253  WBC 6.5 6.6 6.1  NEUTROABS  --   --  3.0  HGB 13.2 12.8* 13.7  HCT 41.4 40.2 40.6  MCV 88.1 89.1 84.8  PLT 249 209 401   Basic Metabolic Panel: Recent Labs  Lab 07/02/18 2148 07/06/18 1641 07/08/18 0253 07/09/18 0207  NA 139 139 138 136  K 3.3* 3.2* 3.1* 3.3*  CL 108 107 98 98  CO2 21* 25 26 27   GLUCOSE 88 120* 103* 112*  BUN 15 13 10 16   CREATININE 1.32* 1.34* 1.53* 1.49*  CALCIUM 9.3 9.1 8.8* 9.3  MG  --   --  1.5* 1.7  PHOS  --   --  3.8  --  GFR: Estimated Creatinine Clearance: 61.7 mL/min (A) (by C-G formula based on SCr of 1.49 mg/dL (H)). Liver Function Tests: Recent Labs  Lab 07/06/18 1651 07/08/18 0253  AST 20  --   ALT 14  --   ALKPHOS 71  --   BILITOT 1.7*  --   PROT 7.3  --   ALBUMIN 4.0 3.4*   Recent Labs  Lab 07/06/18 1651  LIPASE 32   No results for input(s): AMMONIA in the last 168 hours. Coagulation Profile: No results for input(s): INR, PROTIME in the last 168 hours. Cardiac Enzymes: Recent Labs  Lab 07/06/18 1641 07/06/18 2223  TROPONINI 0.05* 0.05*   BNP (last 3 results) No results for input(s): PROBNP in the last 8760 hours. HbA1C: No results for input(s): HGBA1C in the last 72 hours. CBG: No results for input(s): GLUCAP in the last 168 hours. Lipid Profile: No results for input(s): CHOL, HDL, LDLCALC, TRIG, CHOLHDL, LDLDIRECT in the last 72 hours. Thyroid Function Tests: No results for input(s): TSH, T4TOTAL, FREET4, T3FREE, THYROIDAB in the last 72 hours. Anemia Panel: No results for  input(s): VITAMINB12, FOLATE, FERRITIN, TIBC, IRON, RETICCTPCT in the last 72 hours. Sepsis Labs: No results for input(s): PROCALCITON, LATICACIDVEN in the last 168 hours.  Recent Results (from the past 240 hour(s))  MRSA PCR Screening     Status: None   Collection Time: 07/06/18 11:49 PM  Result Value Ref Range Status   MRSA by PCR NEGATIVE NEGATIVE Final    Comment:        The GeneXpert MRSA Assay (FDA approved for NASAL specimens only), is one component of a comprehensive MRSA colonization surveillance program. It is not intended to diagnose MRSA infection nor to guide or monitor treatment for MRSA infections. Performed at [facility], [place] [address], [place], [place] [address]          Radiology Studies: No results found.      Scheduled Meds: . aspirin  81 mg Oral Daily  . atorvastatin  80 mg Oral Daily  . carvedilol  6.25 mg Oral BID WC  . enoxaparin (LOVENOX) injection  40 mg Subcutaneous Daily  . furosemide  40 mg Oral BID  . isosorbide-hydrALAZINE  1 tablet Oral TID  . nicotine  21 mg Transdermal Daily  . sodium chloride flush  3 mL Intravenous Q12H   Continuous Infusions: . sodium chloride    . magnesium sulfate 1 - 4 g bolus IVPB       LOS: 3 days    Time spent: 35 minutes    Irine Seal, MD [facility]  If 7PM-7AM, please contact night-coverage [email or web address] 07/09/2018, 9:58 AM

## 2018-07-09 NOTE — Telephone Encounter (Signed)
New Message    Patient has a TOC appt scheduled with Leonia Reader 07/20/2018 per Pecolia Ades

## 2018-07-09 NOTE — Care Management Note (Signed)
Case Management Note  Patient Details  Name: Cody Guzman MRN: [id number] Date of Birth: 10-25-1965  Subjective/Objective:    Pt admitted with SOB and CP                Action/Plan:  PTA independent with significant other.  CSW consulted for homelessness.  Pt has PCP and active prescription insurance.  Pt educated on importance of daily weights and low salt diet.     Expected Discharge Date:  (unknown)               Expected Discharge Plan:  Home/Self Care  In-House Referral:     Discharge planning Services  CM Consult  Post Acute Care Choice:    Choice offered to:     DME Arranged:    DME Agency:     HH Arranged:    HH Agency:     Status of Service:  In process, will continue to follow  If discussed at Long Length of Stay Meetings, dates discussed:    Additional Comments:  Maryclare Labrador, RN 07/09/2018, 3:53 PM

## 2018-07-10 DIAGNOSIS — G43909 Migraine, unspecified, not intractable, without status migrainosus: Secondary | ICD-10-CM

## 2018-07-10 DIAGNOSIS — I160 Hypertensive urgency: Secondary | ICD-10-CM

## 2018-07-10 DIAGNOSIS — R109 Unspecified abdominal pain: Secondary | ICD-10-CM | POA: Diagnosis present

## 2018-07-10 DIAGNOSIS — R10811 Right upper quadrant abdominal tenderness: Secondary | ICD-10-CM

## 2018-07-10 DIAGNOSIS — K5900 Constipation, unspecified: Secondary | ICD-10-CM

## 2018-07-10 DIAGNOSIS — I5043 Acute on chronic combined systolic (congestive) and diastolic (congestive) heart failure: Secondary | ICD-10-CM | POA: Diagnosis present

## 2018-07-10 DIAGNOSIS — M792 Neuralgia and neuritis, unspecified: Secondary | ICD-10-CM

## 2018-07-10 LAB — BASIC METABOLIC PANEL
Anion gap: 10 (ref 5–15)
BUN: 16 mg/dL (ref 6–20)
CO2: 21 mmol/L — ABNORMAL LOW (ref 22–32)
Calcium: 8.9 mg/dL (ref 8.9–10.3)
Chloride: 103 mmol/L (ref 98–111)
Creatinine, Ser: 1.32 mg/dL — ABNORMAL HIGH (ref 0.61–1.24)
GFR calc Af Amer: >60 mL/min (ref >60)
GFR calc non Af Amer: >60 mL/min (ref >60)
GLUCOSE: 92 mg/dL (ref 70–99)
Potassium: 3.7 mmol/L (ref 3.5–5.1)
Sodium: 134 mmol/L — ABNORMAL LOW (ref 135–145)

## 2018-07-10 LAB — CBC
HCT: 43.8 % (ref 39.0–52.0)
Hemoglobin: 14.1 g/dL (ref 13.0–17.0)
MCH: 27.6 pg (ref 26.0–34.0)
MCHC: 32.2 g/dL (ref 30.0–36.0)
MCV: 85.9 fL (ref 80.0–100.0)
Platelets: 267 10*3/uL (ref 150–400)
RBC: 5.1 MIL/uL (ref 4.22–5.81)
RDW: 15.9 % — ABNORMAL HIGH (ref 11.5–15.5)
WBC: 6.5 10*3/uL (ref 4.0–10.5)
nRBC: 0 % (ref 0.0–0.2)

## 2018-07-10 LAB — HEPATIC FUNCTION PANEL
ALT: 12 U/L (ref 0–44)
AST: 18 U/L (ref 15–41)
Albumin: 3.6 g/dL (ref 3.5–5.0)
Alkaline Phosphatase: 77 U/L (ref 38–126)
Bilirubin, Direct: 0.2 mg/dL (ref 0.0–0.2)
Indirect Bilirubin: 0.8 mg/dL (ref 0.3–0.9)
Total Bilirubin: 1 mg/dL (ref 0.3–1.2)
Total Protein: 7.1 g/dL (ref 6.5–8.1)

## 2018-07-10 LAB — MAGNESIUM: Magnesium: 2.3 mg/dL (ref 1.7–2.4)

## 2018-07-10 MED ORDER — TRAMADOL HCL 50 MG PO TABS: 100.0000 mg | ORAL_TABLET | Freq: Four times a day (QID) | ORAL | Status: DC | PRN

## 2018-07-10 MED ORDER — TRAMADOL HCL 50 MG PO TABS: 50.0000 mg | ORAL_TABLET | Freq: Four times a day (QID) | ORAL | 0 refills | Status: DC | PRN

## 2018-07-10 MED ORDER — GABAPENTIN 100 MG PO CAPS
100.0000 mg | ORAL_CAPSULE | Freq: Three times a day (TID) | ORAL | Status: DC
  Administered 2018-07-10: 100 mg via ORAL
  Filled 2018-07-10: qty 1

## 2018-07-10 MED ORDER — ISOSORB DINITRATE-HYDRALAZINE 20-37.5 MG PO TABS: 1.0000 | ORAL_TABLET | Freq: Three times a day (TID) | ORAL | 0 refills | Status: DC

## 2018-07-10 MED ORDER — TRIAMCINOLONE ACETONIDE 0.1 % EX CREA
TOPICAL_CREAM | Freq: Three times a day (TID) | CUTANEOUS | Status: DC
  Filled 2018-07-10: qty 15

## 2018-07-10 MED ORDER — PROCHLORPERAZINE MALEATE 10 MG PO TABS: 10.0000 mg | ORAL_TABLET | Freq: Four times a day (QID) | ORAL | 0 refills | Status: DC | PRN

## 2018-07-10 MED ORDER — NITROGLYCERIN 0.4 MG SL SUBL: 0.4000 mg | SUBLINGUAL_TABLET | SUBLINGUAL | 0 refills | Status: DC | PRN

## 2018-07-10 MED ORDER — POLYETHYLENE GLYCOL 3350 17 G PO PACK: 17.0000 g | PACK | Freq: Every day | ORAL | 0 refills | Status: DC | PRN

## 2018-07-10 MED ORDER — ATORVASTATIN CALCIUM 80 MG PO TABS: 80.0000 mg | ORAL_TABLET | Freq: Every day | ORAL | 0 refills | Status: DC

## 2018-07-10 MED ORDER — NICOTINE 21 MG/24HR TD PT24: 21.0000 mg | MEDICATED_PATCH | Freq: Every day | TRANSDERMAL | 0 refills | Status: DC

## 2018-07-10 MED ORDER — GABAPENTIN 600 MG PO TABS: 300.0000 mg | ORAL_TABLET | Freq: Three times a day (TID) | ORAL | Status: DC

## 2018-07-10 MED ORDER — TRIAMCINOLONE ACETONIDE 0.025 % EX CREA: 1.0000 "application " | TOPICAL_CREAM | Freq: Two times a day (BID) | CUTANEOUS | 0 refills | Status: DC

## 2018-07-10 MED ORDER — PROCHLORPERAZINE MALEATE 10 MG PO TABS
10.0000 mg | ORAL_TABLET | Freq: Four times a day (QID) | ORAL | Status: DC | PRN
  Filled 2018-07-10: qty 1

## 2018-07-10 MED ORDER — GABAPENTIN 600 MG PO TABS: 300.0000 mg | ORAL_TABLET | Freq: Three times a day (TID) | ORAL | 0 refills | Status: DC

## 2018-07-10 MED ORDER — POLYETHYLENE GLYCOL 3350 17 G PO PACK
17.0000 g | PACK | Freq: Two times a day (BID) | ORAL | Status: DC
  Administered 2018-07-10: 17 g via ORAL
  Filled 2018-07-10: qty 1

## 2018-07-10 MED ORDER — ALBUTEROL SULFATE HFA 108 (90 BASE) MCG/ACT IN AERS: 2.0000 | INHALATION_SPRAY | Freq: Four times a day (QID) | RESPIRATORY_TRACT | 0 refills | Status: DC | PRN

## 2018-07-10 MED ORDER — FUROSEMIDE 40 MG PO TABS: 40.0000 mg | ORAL_TABLET | Freq: Every day | ORAL | 0 refills | Status: DC

## 2018-07-10 MED ORDER — SENNOSIDES-DOCUSATE SODIUM 8.6-50 MG PO TABS: 1.0000 | ORAL_TABLET | Freq: Two times a day (BID) | ORAL | Status: DC

## 2018-07-10 MED ORDER — SPIRONOLACTONE 25 MG PO TABS: 12.5000 mg | ORAL_TABLET | Freq: Every day | ORAL | 0 refills | Status: DC

## 2018-07-10 MED ORDER — CARVEDILOL 6.25 MG PO TABS: 6.2500 mg | ORAL_TABLET | Freq: Two times a day (BID) | ORAL | 0 refills | Status: DC

## 2018-07-10 NOTE — Discharge Summary (Signed)
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 07/10/2018, 5:45 PM

## 2018-07-10 NOTE — Progress Notes (Signed)
Provided taxi cab voucher for transport to [facility]. CSW signing off.
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
## 2018-07-12 ENCOUNTER — Telehealth: Payer: Self-pay

## 2018-07-12 NOTE — Telephone Encounter (Signed)
Call placed to patient [phone]  to check on him post discharge and to schedule a follow up appointment.  Message left requesting a call back to this CM # [phone].

## 2018-07-13 ENCOUNTER — Other Ambulatory Visit: Payer: Self-pay

## 2018-07-13 NOTE — Telephone Encounter (Signed)
Patient contacted regarding discharge from [facility] on 07/10/18.  Patient understands to follow up with provider Purcell Nails on 07/20/18 at 9:30 am at [facility]. Patient understands discharge instructions?  no Patient understands medications and regiment? no Patient understands to bring all medications to this visit? no

## 2018-07-13 NOTE — Patient Outreach (Signed)
[facility]) Care Management  07/13/2018  Cody Guzman 1966/01/17 [id number]   Referral received. No outreach warranted at this time. Transition of Care  will be completed by primary care provider office who will refer to [facility] care management if needed.   Plan: RN CM will close case.  Jone Baseman, RN, MSN [facility] Care Management Coordinator Direct Line [phone] Cell [phone] Toll Free: [phone]  Fax: [phone]

## 2018-07-14 NOTE — Telephone Encounter (Signed)
I have been unable to reach his caseworker during her office hours. Will forward to see if our clinic case manager can contact his caseworker at the [facility]

## 2018-07-15 NOTE — Telephone Encounter (Signed)
Call placed to [place],  [place] intern at [facility] # [phone] x [phone].  She explained that the patient informed her that he has all of his " physical" medications but does not have any behavioral health medications - lithium, seroquel, elavil. She said that he was receiving these medications when [place] was [place] but has not received them since being in [place].  She was wondering if a referral has ever been made for behavioral health. If not, she stated that she could assist with the referral. Informed her that Dr Chapman Fitch would be notified.    Call placed to the patient to discuss scheduling a hospital follow up appointment. He stated that he is feeling okay and has all of his medications.  An appointment was scheduled with Dr Chapman Fitch for 07/23/2018 @ 1050.   He explained that his concern is housing. He continues to live in a motel which is getting very expensive. He just received meals from [facility] and needs to find a place to refrigerate them.  He only receives $80/month of food stamps.  He said that he has contacted [employer] regarding housing as well as [employer], [employer] and [employer]. He has access to [email or web address] and has some apartments to look at next week. The barrier that he finds is regarding his service dog. He said that he will need documentation that the dog is accepted by [place] law.  He noted that he had this information from his prior out of state residence and sent information about the issue to [employer], LCSW. He will need to discuss with Dr Chapman Fitch at his next visit

## 2018-07-15 NOTE — Telephone Encounter (Signed)
Patient has had social work consult at least twice and was to follow-up with mental health. I believe that there has been difficulty getting people referred to psychiatry
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
## 2018-07-20 ENCOUNTER — Ambulatory Visit: Payer: Medicare HMO | Admitting: Adult Health

## 2018-07-21 ENCOUNTER — Encounter: Payer: Self-pay | Admitting: *Deleted

## 2018-07-23 ENCOUNTER — Ambulatory Visit: Payer: Medicare HMO | Admitting: Family Medicine

## 2018-07-26 ENCOUNTER — Other Ambulatory Visit: Payer: Self-pay | Admitting: Family Medicine

## 2018-07-26 DIAGNOSIS — F332 Major depressive disorder, recurrent severe without psychotic features: Secondary | ICD-10-CM

## 2018-07-26 DIAGNOSIS — F4312 Post-traumatic stress disorder, chronic: Secondary | ICD-10-CM

## 2018-07-26 NOTE — Telephone Encounter (Signed)
Patient did not keep his recent appointment but I will again place a referral to psychiatry

## 2018-07-26 NOTE — Progress Notes (Signed)
Patient ID: Cody Guzman, male   DOB: Sep 11, 1965, 53 y.o.   MRN: [id number]   Patient is being referred to psychiatry in follow-up of anxiety and depression however he missed his most recent office visit last week. Referral was placed in the past but it does not appear that patient ever established care.

## 2018-07-29 ENCOUNTER — Telehealth: Payer: Self-pay | Admitting: Family Medicine

## 2018-07-29 NOTE — Telephone Encounter (Signed)
Patient called stating he is having issues with his medications patient is not sure which one is causing issues but believes it is one of his new medications.   Gabapentin bidil Aldactone  Patient states he can't walk more than 7-8 feet before he needs to stop. States his legs are swollen, headaches,   Advised to go to the ED or UC.

## 2018-07-29 NOTE — Telephone Encounter (Signed)
Patient should go to the ED in follow-up of his leg swelling and SOB. He is s/p recent hospitalization for his CHF. He could also have a DVT/blood clot since he is having leg pain and swelling and he would need imaging to determine if a blood clot is present

## 2018-07-29 NOTE — Telephone Encounter (Signed)
Patients call returned.  Patient identified by name and date of birth.Patient complaining of leg pain and swelling. 7/10 while sitting; 10/10 pain while walking. Patient is unable to walk 9-10 steps without having to stop due to shortness of breath. Patient endorses a productive cough with white to light yellow flem.   Patient also complaining of multiple migraines since his hospital visits. Patient denies chest pain.  Patient advise to go to ED or UC to be emergently evaluated.

## 2018-07-29 NOTE — Telephone Encounter (Signed)
Opal Sidles, can you try to contact patient or if you see Dub Mikes, remind him to check message and contact patient today

## 2018-07-29 NOTE — Telephone Encounter (Signed)
Okay, thank you!
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
## 2018-08-05 ENCOUNTER — Inpatient Hospital Stay: Payer: Medicare HMO

## 2018-08-10 NOTE — Telephone Encounter (Signed)
Please inform patient of the referral information

## 2018-08-10 NOTE — Telephone Encounter (Signed)
Follow up    Pt states his phone broke with all of his appt information in it and wants to know where he is suppose to go for his psychiatric evaluation. Please f/u

## 2018-08-18 ENCOUNTER — Ambulatory Visit: Payer: Medicare HMO | Attending: Critical Care Medicine | Admitting: Critical Care Medicine

## 2018-08-18 ENCOUNTER — Telehealth: Payer: Self-pay

## 2018-08-18 ENCOUNTER — Ambulatory Visit (HOSPITAL_BASED_OUTPATIENT_CLINIC_OR_DEPARTMENT_OTHER): Payer: Medicare HMO | Admitting: Licensed Clinical Social Worker

## 2018-08-18 ENCOUNTER — Encounter: Payer: Self-pay | Admitting: Critical Care Medicine

## 2018-08-18 ENCOUNTER — Other Ambulatory Visit: Payer: Self-pay

## 2018-08-18 DIAGNOSIS — Z5900 Homelessness unspecified: Secondary | ICD-10-CM

## 2018-08-18 DIAGNOSIS — I10 Essential (primary) hypertension: Secondary | ICD-10-CM | POA: Insufficient documentation

## 2018-08-18 DIAGNOSIS — I272 Pulmonary hypertension, unspecified: Secondary | ICD-10-CM | POA: Diagnosis not present

## 2018-08-18 DIAGNOSIS — I360 Nonrheumatic tricuspid (valve) stenosis: Secondary | ICD-10-CM | POA: Diagnosis not present

## 2018-08-18 DIAGNOSIS — I340 Nonrheumatic mitral (valve) insufficiency: Secondary | ICD-10-CM

## 2018-08-18 DIAGNOSIS — Z87828 Personal history of other (healed) physical injury and trauma: Secondary | ICD-10-CM

## 2018-08-18 DIAGNOSIS — R918 Other nonspecific abnormal finding of lung field: Secondary | ICD-10-CM

## 2018-08-18 DIAGNOSIS — Z1159 Encounter for screening for other viral diseases: Secondary | ICD-10-CM

## 2018-08-18 DIAGNOSIS — G8929 Other chronic pain: Secondary | ICD-10-CM

## 2018-08-18 DIAGNOSIS — M792 Neuralgia and neuritis, unspecified: Secondary | ICD-10-CM | POA: Diagnosis not present

## 2018-08-18 DIAGNOSIS — I070 Rheumatic tricuspid stenosis: Secondary | ICD-10-CM | POA: Insufficient documentation

## 2018-08-18 DIAGNOSIS — M5441 Lumbago with sciatica, right side: Secondary | ICD-10-CM

## 2018-08-18 DIAGNOSIS — I160 Hypertensive urgency: Secondary | ICD-10-CM | POA: Diagnosis not present

## 2018-08-18 DIAGNOSIS — I509 Heart failure, unspecified: Secondary | ICD-10-CM

## 2018-08-18 DIAGNOSIS — I25119 Atherosclerotic heart disease of native coronary artery with unspecified angina pectoris: Secondary | ICD-10-CM | POA: Diagnosis not present

## 2018-08-18 MED ORDER — NITROGLYCERIN 0.4 MG SL SUBL: 0.4000 mg | SUBLINGUAL_TABLET | SUBLINGUAL | 3 refills | Status: DC | PRN

## 2018-08-18 MED ORDER — ATORVASTATIN CALCIUM 80 MG PO TABS: 80.0000 mg | ORAL_TABLET | Freq: Every day | ORAL | 3 refills | Status: DC

## 2018-08-18 MED ORDER — SPIRONOLACTONE 25 MG PO TABS: 12.5000 mg | ORAL_TABLET | Freq: Every day | ORAL | 3 refills | Status: DC

## 2018-08-18 MED ORDER — GABAPENTIN 600 MG PO TABS: 300.0000 mg | ORAL_TABLET | Freq: Three times a day (TID) | ORAL | 3 refills | Status: DC

## 2018-08-18 MED ORDER — TRAMADOL HCL 50 MG PO TABS: 50.0000 mg | ORAL_TABLET | Freq: Four times a day (QID) | ORAL | 0 refills | Status: DC | PRN

## 2018-08-18 MED ORDER — ISOSORB DINITRATE-HYDRALAZINE 20-37.5 MG PO TABS: 1.0000 | ORAL_TABLET | Freq: Three times a day (TID) | ORAL | 3 refills | Status: DC

## 2018-08-18 MED ORDER — SENNOSIDES-DOCUSATE SODIUM 8.6-50 MG PO TABS: 1.0000 | ORAL_TABLET | Freq: Two times a day (BID) | ORAL | 3 refills | Status: DC

## 2018-08-18 MED ORDER — FUROSEMIDE 40 MG PO TABS: 40.0000 mg | ORAL_TABLET | Freq: Every day | ORAL | 3 refills | Status: DC

## 2018-08-18 MED ORDER — PROCHLORPERAZINE MALEATE 10 MG PO TABS: 10.0000 mg | ORAL_TABLET | Freq: Four times a day (QID) | ORAL | 0 refills | Status: DC | PRN

## 2018-08-18 MED ORDER — CARVEDILOL 6.25 MG PO TABS: 6.2500 mg | ORAL_TABLET | Freq: Two times a day (BID) | ORAL | 3 refills | Status: DC

## 2018-08-18 MED FILL — SPIRONOLACTONE 25 MG TABLET: 25 | 90 days supply | Qty: 45 | Fill #0

## 2018-08-18 MED FILL — traMADol HCL 50 MG TABS: 50 | 7 days supply | Qty: 30 | Fill #0

## 2018-08-18 MED FILL — GABAPENTIN 600 MG TABLET: 600 | 30 days supply | Qty: 45 | Fill #0

## 2018-08-18 MED FILL — FUROSEMIDE 40 MG TAB: 40 | 90 days supply | Qty: 90 | Fill #0

## 2018-08-18 MED FILL — NITROGLYCERIN 0.4 MG TAB SL: 0.4 | 10 days supply | Qty: 25 | Fill #1

## 2018-08-18 MED FILL — BIDIL TABLET: 20-37.5 | 30 days supply | Qty: 90 | Fill #0

## 2018-08-18 MED FILL — CARVEDILOL 6.25 MG TABLET: 6.25 | 90 days supply | Qty: 180 | Fill #0

## 2018-08-18 MED FILL — PROCHLORPERAZINE 10 MG TAB: 10 | 5 days supply | Qty: 20 | Fill #0
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
## 2018-08-18 NOTE — Telephone Encounter (Signed)
Spoke to patient via telephone with Christa See, LCSW. He said that he is currently homeless. He plans to go to the [facility] today as he understands that they are providing housing in [place] for homeless individuals. He also understands that they can assist with locating permanent housing. He said that he made calls about housing but has not been successful locating an apartment/ house.   He has an FL2 and is also interested in ALF.   He sent the [facility] to two place, one being [facility] and he was was hopeful that a bed would be available but that did not happen. He understands that   He also needs to contact his provider out of state to fax this clinic information about re-certifying his service animal. He requested that the [facility] fax number be text to him as he was not able to write the number down.  Clinic fax number text to his mobile phone as requested.

## 2018-08-20 ENCOUNTER — Other Ambulatory Visit: Payer: Self-pay

## 2018-08-20 NOTE — BH Specialist Note (Signed)
LCSWA received transferred call from [place] with RN CM Eden Lathe present. Pt requested to speak with supportive staff regarding FL2 and registration of support animal.   Pt shared that he is still experiencing homelessness. When asked about the management of mental health, pt disclosed, "I've had my moments. I'm okay" Pt has been utilizing healthy coping skills to decrease stressors by video calling with family and praying.   Pt recently lost all of his belongings after eviction. The community agency that was paying pt's rent lost funding. Pt plans on visiting the [facility] today to inquire about placement at a motel due to chronic medical conditions.   Pt agreed to follow up with previous primary care provider, [facility] in [place], who last submitted application regarding support animal today. He will provide [facility]'s fax number for supportive documentation to be sent to PCP.   LCSWA strongly encouraged pt to pick up medications from pharmacy and complete lab work ordered by Provider. No additional concerns noted.

## 2018-08-23 NOTE — Progress Notes (Addendum)
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 natriuretic peptide -     DME Wheelchair manual  Chronic kidney disease (CKD), stage II (mild)  Depression, unspecified depression type  Posttraumatic stress disorder  Coronary artery disease involving native coronary artery of native heart with angina pectoris ([place]) -     CBC with Differential/Platelet  Thyroid nodule  Chest pain, unspecified type  Chronic right-sided low back pain with right-sided sciatica  Pulmonary nodules/lesions, multiple

## 2018-08-24 ENCOUNTER — Other Ambulatory Visit: Payer: Self-pay

## 2018-08-24 ENCOUNTER — Telehealth: Payer: Self-pay

## 2018-08-24 ENCOUNTER — Encounter: Payer: Self-pay | Admitting: Critical Care Medicine

## 2018-08-24 ENCOUNTER — Ambulatory Visit: Payer: Medicare HMO | Attending: Critical Care Medicine | Admitting: Critical Care Medicine

## 2018-08-24 VITALS — BP 132/94 | HR 72 | Temp 97.9°F | Resp 18

## 2018-08-24 DIAGNOSIS — M5441 Lumbago with sciatica, right side: Secondary | ICD-10-CM

## 2018-08-24 DIAGNOSIS — I429 Cardiomyopathy, unspecified: Secondary | ICD-10-CM

## 2018-08-24 DIAGNOSIS — F329 Major depressive disorder, single episode, unspecified: Secondary | ICD-10-CM

## 2018-08-24 DIAGNOSIS — I25119 Atherosclerotic heart disease of native coronary artery with unspecified angina pectoris: Secondary | ICD-10-CM

## 2018-08-24 DIAGNOSIS — E876 Hypokalemia: Secondary | ICD-10-CM

## 2018-08-24 DIAGNOSIS — I340 Nonrheumatic mitral (valve) insufficiency: Secondary | ICD-10-CM

## 2018-08-24 DIAGNOSIS — I2510 Atherosclerotic heart disease of native coronary artery without angina pectoris: Secondary | ICD-10-CM | POA: Diagnosis not present

## 2018-08-24 DIAGNOSIS — R079 Chest pain, unspecified: Secondary | ICD-10-CM

## 2018-08-24 DIAGNOSIS — N182 Chronic kidney disease, stage 2 (mild): Secondary | ICD-10-CM

## 2018-08-24 DIAGNOSIS — G8929 Other chronic pain: Secondary | ICD-10-CM

## 2018-08-24 DIAGNOSIS — I272 Pulmonary hypertension, unspecified: Secondary | ICD-10-CM

## 2018-08-24 DIAGNOSIS — I509 Heart failure, unspecified: Secondary | ICD-10-CM

## 2018-08-24 DIAGNOSIS — R918 Other nonspecific abnormal finding of lung field: Secondary | ICD-10-CM

## 2018-08-24 DIAGNOSIS — F32A Depression, unspecified: Secondary | ICD-10-CM

## 2018-08-24 DIAGNOSIS — E041 Nontoxic single thyroid nodule: Secondary | ICD-10-CM

## 2018-08-24 DIAGNOSIS — I10 Essential (primary) hypertension: Secondary | ICD-10-CM

## 2018-08-24 DIAGNOSIS — F4310 Post-traumatic stress disorder, unspecified: Secondary | ICD-10-CM

## 2018-08-24 NOTE — Telephone Encounter (Signed)
Faxed medical release form requesting documentation for his service dog to 1) [facility](fa. [phone]) & 2) [facility](fa. [phone]).

## 2018-08-24 NOTE — Assessment & Plan Note (Signed)
The patient has chronic low back pain and a one-time prescription for tramadol was given along with a prescription for follow-up gabapentin

## 2018-08-24 NOTE — Patient Instructions (Signed)
Please obtain your labs today  Please pick up all your medications today from our pharmacy  A return visit with Dr. [name] in 2 weeks which will be a telephone visit will be made  We will ensure that you have a cardiology appointment  The social worker will help you with services with regards to your drug use and also assistance with the service dog application  Our nurse case manager will assist you with a wheelchair and I will write an order for this today and we can begin to work on a motorized wheelchair for later  Please reduce tobacco use as much as possible

## 2018-08-24 NOTE — Assessment & Plan Note (Signed)
Ongoing chest pain will need further evaluation per cardiology

## 2018-08-24 NOTE — Assessment & Plan Note (Addendum)
Pulmonary hypertension is worsening secondary to the mitral regurgitation

## 2018-08-24 NOTE — Assessment & Plan Note (Signed)
Patient has hypokalemia and hypomagnesemia and we will follow-up lab studies

## 2018-08-24 NOTE — Assessment & Plan Note (Signed)
Thyroid nodule needs follow-up ultrasound

## 2018-08-24 NOTE — Assessment & Plan Note (Signed)
Hypertension appears to be reasonably controlled at this time  Continue cardiac medications as noted in heart failure assessment

## 2018-08-24 NOTE — Telephone Encounter (Signed)
Met with the patient when he was in the clinic today.  Order received for manual wheelchair. He said that he has no preference for DME companies.  Informed him that the company would be in touch with him about insurance coverage/delivery.  He said that he and his wife are currently staying in a first floor room at a motel and he will not have difficulty accessing the room.  He is also working on securing more permanent housing.  He said that he still has not been able to contact the clinic that provided him with the initial  documentation for his service dog.  He completed a ROI while at [facility] to request the document from the clinic in [place].  Referral for wheelchair faxed to [facility]

## 2018-08-24 NOTE — Assessment & Plan Note (Addendum)
Cardiomyopathy with congestive heart failure and associated tricuspid and mitral regurgitation Patient has severe exercise intolerance and is only able to walk very short distances becoming acutely short of breath  The patient would benefit from a motorized wheelchair and with this would need a seat cushion, back cushion, anti-tippers, seatbelt and elevated leg rests.  A follow-up cardiology appointment will be obtained  The patient's cardiac medications will be continued and refills obtained today in the clinic to include spironolactone, BiDil, furosemide, Coreg ,

## 2018-08-24 NOTE — Assessment & Plan Note (Signed)
The patient has significant mitral and tricuspid valve disease

## 2018-08-24 NOTE — Assessment & Plan Note (Signed)
Multiple pulmonary nodules are less than 5 mm and will be reimaged in 1 year and smoking cessation was advised

## 2018-08-25 LAB — CBC WITH DIFFERENTIAL/PLATELET
Basophils Absolute: 0.1 10*3/uL (ref 0.0–0.2)
Basos: 1 %
EOS (ABSOLUTE): 0.2 10*3/uL (ref 0.0–0.4)
Eos: 3 %
Hematocrit: 40.3 % (ref 37.5–51.0)
Hemoglobin: 13.6 g/dL (ref 13.0–17.7)
Immature Grans (Abs): 0 10*3/uL (ref 0.0–0.1)
Immature Granulocytes: 0 %
Lymphocytes Absolute: 2.3 10*3/uL (ref 0.7–3.1)
Lymphs: 38 %
MCH: 29.2 pg (ref 26.6–33.0)
MCHC: 33.7 g/dL (ref 31.5–35.7)
MCV: 87 fL (ref 79–97)
Monocytes Absolute: 0.6 10*3/uL (ref 0.1–0.9)
Monocytes: 10 %
Neutrophils Absolute: 2.9 10*3/uL (ref 1.4–7.0)
Neutrophils: 48 %
Platelets: 245 10*3/uL (ref 150–450)
RBC: 4.66 x10E6/uL (ref 4.14–5.80)
RDW: 15.8 % — ABNORMAL HIGH (ref 11.6–15.4)
WBC: 6.1 10*3/uL (ref 3.4–10.8)

## 2018-08-25 LAB — LIPID PANEL
Chol/HDL Ratio: 3 ratio (ref 0.0–5.0)
Cholesterol, Total: 133 mg/dL (ref 100–199)
HDL: 45 mg/dL (ref >39)
LDL Calculated: 76 mg/dL (ref 0–99)
Triglycerides: 58 mg/dL (ref 0–149)
VLDL Cholesterol Cal: 12 mg/dL (ref 5–40)

## 2018-08-25 LAB — BRAIN NATRIURETIC PEPTIDE: BNP: 1616.3 pg/mL — ABNORMAL HIGH (ref 0.0–100.0)

## 2018-08-25 LAB — MAGNESIUM: Magnesium: 1.6 mg/dL (ref 1.6–2.3)

## 2018-08-31 NOTE — Progress Notes (Signed)
[place] Screening performed. Temperature, PHQ-9, and need for medical care and medications assessed. No additional needs assessed.  Arnold Long RN MSN

## 2018-09-06 ENCOUNTER — Ambulatory Visit (INDEPENDENT_AMBULATORY_CARE_PROVIDER_SITE_OTHER): Payer: Medicare HMO | Admitting: Psychiatry

## 2018-09-06 ENCOUNTER — Telehealth: Payer: Self-pay

## 2018-09-06 ENCOUNTER — Encounter: Payer: Self-pay | Admitting: Psychiatry

## 2018-09-06 ENCOUNTER — Other Ambulatory Visit: Payer: Self-pay

## 2018-09-06 DIAGNOSIS — F4310 Post-traumatic stress disorder, unspecified: Secondary | ICD-10-CM

## 2018-09-06 DIAGNOSIS — F1421 Cocaine dependence, in remission: Secondary | ICD-10-CM | POA: Diagnosis not present

## 2018-09-06 DIAGNOSIS — F3160 Bipolar disorder, current episode mixed, unspecified: Secondary | ICD-10-CM

## 2018-09-06 DIAGNOSIS — F17200 Nicotine dependence, unspecified, uncomplicated: Secondary | ICD-10-CM

## 2018-09-06 MED ORDER — QUETIAPINE FUMARATE 50 MG PO TABS: 50.0000 mg | ORAL_TABLET | Freq: Every day | ORAL | 1 refills | Status: DC

## 2018-09-06 MED FILL — QUETIAPINE FUMARATE 50 MG T: 50 | 30 days supply | Qty: 30 | Fill #0

## 2018-09-06 NOTE — Progress Notes (Addendum)
Patient ID: Cody Guzman, male   DOB: 1965/10/13, 53 y.o.   MRN: [id number]  Virtual Visit via telephone Note  I connected with Cody Guzman on 09/07/18 at [place]  by a telephone enabled telemedicine application and verified that I am speaking with the correct person using two identifiers.   Consent:  I discussed the limitations, risks, security and privacy concerns of performing an evaluation and management service by telephone visit and the availability of in person appointments. I also discussed with the patient that there may be a patient responsible charge related to this service. The patient expressed understanding and agreed to proceed.  Location of patient: pt at home   Location of provider: in my office  Persons participating in the televisit with the patient.  Only pt on the phone   History of Present Illness: This is a 53 year old male with history of significant dilated cardiomyopathy with associated tricuspid and mitral regurgitation.  The patient was last seen in the office on April 21.  Since that visit the patient's level of dyspnea and chest pain have improved.  He has no sweats.  No fever.  He has a slight cough.  There is no orthopnea or paroxysmal nocturnal dyspnea.  He does have significant insomnia.  And just saw psychiatry through a telemetry visit yesterday on 09/06/2018.  The patient was diagnosed with bipolar disorder and posttraumatic stress syndrome and was given a prescription for Seroquel which she has yet to pick up.  Patient still has chronic back pain and does use the tramadol as needed and is also on the Neurontin.  The patient has slight dysuria.  The psychiatrist wanted thyroid function testing to be obtained and we will order this also the patient has a thyroid nodule and ultrasound of the thyroid needs to be obtained as well.  The patient has a pending cardiology appointment.  He also has a wheelchair that is to be ordered and is still pending his approval for  his dog to be a service dog.  He states the steroid cream is helped the rash that he had on his skin.  As to the issue of the wheelchair the patient is unable to perform his activities of daily living however he can self propel a manual wheelchair.  Currently we are ordering a manual wheelchair for this patient.  Patient has significant cardiac symptoms that limit his mobility as well as severe degenerative back pain such that he is not able to ambulate even short distances.  Also his activities of daily living are extremely limited.  Note there is a caregiver that lives with him will be able to help propel the wheelchair and he can also self propel the wheelchair The patient currently is living in a motel provided [facility].  Patient also notes the rash that he been using steroid cream on has improved on the skin.   Constitutional:   No  weight loss, night sweats,  Fevers, chills, fatigue, lassitude. HEENT:   No headaches,  Difficulty swallowing,  Tooth/dental problems,  Sore throat,                No sneezing, itching, ear ache, nasal congestion, post nasal drip,   CV:   chest pain,  Orthopnea, PND, swelling in lower extremities, anasarca, dizziness, palpitations  GI  No heartburn, indigestion, abdominal pain, nausea, vomiting, diarrhea, change in bowel habits, loss of appetite  Resp: o shortness of breath with exertion at rest.  No excess mucus,  no productive cough,  No non-productive cough,  No coughing up of blood.  No change in color of mucus.  No wheezing.  No chest wall deformity  Skin: no rash or lesions.  GU:  dysuria, change in color of urine, no urgency or frequency.  No flank pain.  MS:  No joint pain or swelling.  No decreased range of motion.   back pain.  Psych:  No change in mood or affect. No depression or anxiety.  No memory loss.    Observations/Objective: There are no observations as this is a telephone visit  Assessment and Plan: #1  congestive heart failure with systolic component compensated at this time with associated tricuspid stenosis mitral regurgitation and pulmonary hypertension  Plan will be to maintain BiDil, furosemide, Coreg at all, and spironolactone.  We will also ensure the patient has a cardiology referral in place  Will obtain a thyroid panel associated with basic metabolic panel  We will also obtain a hepatitis C screen  #2 hypertension no ability to monitor blood pressure at this time in the home we will have the patient follow-up in June with follow-up blood pressure check  #3 posttraumatic stress disorder and new onset diagnosed bipolar per psychiatry  Plan will be for the patient to pick up his Seroquel prescription at our pharmacy  #4 history of drug use in the past note we will obtain a hepatitis C screen  #5 thyroid nodule note we will obtain thyroid function as well as a thyroid ultrasound  #6 chronic kidney disease stage II note we will obtain basic metabolic panel  #7 chronic low back pain note the patient will continue Neurontin as prescribed and I indicated to the patient he may require a pain management referral in addition I have signed an order for this patient to obtain a manual wheelchair due to his severe limitations in his activities of daily living  Follow Up Instructions: The patient knows to come in to pick up his Seroquel and will obtain his lab data upon arrival Patient also knows cardiology will contact him for an appointment  Note the patient is aware that he has a return visit in June in office   I discussed the assessment and treatment plan with the patient. The patient was provided an opportunity to ask questions and all were answered. The patient agreed with the plan and demonstrated an understanding of the instructions.   The patient was advised to call back or seek an in-person evaluation if the symptoms worsen or if the condition fails to improve as anticipated.  I  provided 25 minutes of non-face-to-face time during this encounter  including  median intraservice time , review of notes, labs, imaging, medications  and explaining diagnosis and management to the patient .    Asencion Noble, MD
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
## 2018-09-06 NOTE — Telephone Encounter (Signed)
Visit noted for wheelchair order faxed to [facility]

## 2018-09-06 NOTE — Progress Notes (Signed)
Tc on  09-06-18 @ 1:50 pt medical and surgical hx was reviewed and updated. Pt medications and pharmacy were reviewed and updated. Pt allergies were reviewed with no changes.

## 2018-09-07 ENCOUNTER — Ambulatory Visit: Payer: Medicare HMO | Attending: Critical Care Medicine | Admitting: Critical Care Medicine

## 2018-09-07 ENCOUNTER — Encounter: Payer: Self-pay | Admitting: Critical Care Medicine

## 2018-09-07 ENCOUNTER — Telehealth: Payer: Self-pay

## 2018-09-07 ENCOUNTER — Encounter: Payer: Self-pay | Admitting: Psychiatry

## 2018-09-07 DIAGNOSIS — I10 Essential (primary) hypertension: Secondary | ICD-10-CM

## 2018-09-07 DIAGNOSIS — I340 Nonrheumatic mitral (valve) insufficiency: Secondary | ICD-10-CM

## 2018-09-07 DIAGNOSIS — N182 Chronic kidney disease, stage 2 (mild): Secondary | ICD-10-CM

## 2018-09-07 DIAGNOSIS — E041 Nontoxic single thyroid nodule: Secondary | ICD-10-CM | POA: Diagnosis not present

## 2018-09-07 DIAGNOSIS — M5441 Lumbago with sciatica, right side: Secondary | ICD-10-CM | POA: Diagnosis not present

## 2018-09-07 DIAGNOSIS — G8929 Other chronic pain: Secondary | ICD-10-CM | POA: Diagnosis not present

## 2018-09-07 DIAGNOSIS — I509 Heart failure, unspecified: Secondary | ICD-10-CM

## 2018-09-07 DIAGNOSIS — Z5900 Homelessness unspecified: Secondary | ICD-10-CM

## 2018-09-07 DIAGNOSIS — Z1159 Encounter for screening for other viral diseases: Secondary | ICD-10-CM

## 2018-09-07 DIAGNOSIS — F4310 Post-traumatic stress disorder, unspecified: Secondary | ICD-10-CM | POA: Diagnosis not present

## 2018-09-07 DIAGNOSIS — E785 Hyperlipidemia, unspecified: Secondary | ICD-10-CM

## 2018-09-07 DIAGNOSIS — I429 Cardiomyopathy, unspecified: Secondary | ICD-10-CM | POA: Diagnosis not present

## 2018-09-07 DIAGNOSIS — F319 Bipolar disorder, unspecified: Secondary | ICD-10-CM

## 2018-09-07 HISTORY — DX: Homelessness unspecified: Z59.00

## 2018-09-07 NOTE — Telephone Encounter (Signed)
Additional documentation for manual wheelchair faxed to [facility]

## 2018-09-07 NOTE — Progress Notes (Signed)
Called patient to initiate their telephone visit with provider Asencion Noble MD. Verified date of birth. Patient states that he feels about the same since last visit. New c/o nausea & headaches after taking his medications. Back pain is the same. KWalker, CMA.

## 2018-09-07 NOTE — Progress Notes (Signed)
COVID Hotel Screening performed. Temperature, PHQ-9, and need for medical care and medications assessed. No additional needs assessed at this time.  Maddyx Wieck RN MSN 

## 2018-09-09 ENCOUNTER — Ambulatory Visit (INDEPENDENT_AMBULATORY_CARE_PROVIDER_SITE_OTHER): Payer: Medicare HMO | Admitting: Licensed Clinical Social Worker

## 2018-09-09 ENCOUNTER — Other Ambulatory Visit: Payer: Self-pay

## 2018-09-09 ENCOUNTER — Encounter: Payer: Self-pay | Admitting: Licensed Clinical Social Worker

## 2018-09-09 DIAGNOSIS — F3160 Bipolar disorder, current episode mixed, unspecified: Secondary | ICD-10-CM

## 2018-09-09 DIAGNOSIS — F17200 Nicotine dependence, unspecified, uncomplicated: Secondary | ICD-10-CM | POA: Diagnosis not present

## 2018-09-09 DIAGNOSIS — F4310 Post-traumatic stress disorder, unspecified: Secondary | ICD-10-CM

## 2018-09-09 DIAGNOSIS — F1421 Cocaine dependence, in remission: Secondary | ICD-10-CM | POA: Diagnosis not present
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
## 2018-09-10 ENCOUNTER — Telehealth: Payer: Self-pay | Admitting: Cardiology

## 2018-09-10 NOTE — Telephone Encounter (Signed)
LMTCB - see note in referral workqueue.  Patient can  See [place] Gave or app. Please schedule.

## 2018-09-13 ENCOUNTER — Other Ambulatory Visit: Payer: Medicare HMO

## 2018-09-14 ENCOUNTER — Other Ambulatory Visit: Payer: Medicare HMO

## 2018-09-16 ENCOUNTER — Telehealth: Payer: Self-pay

## 2018-09-16 NOTE — Progress Notes (Signed)
[place] Screening performed. Temperature, PHQ-9, and need for medical care and medications assessed. Patient and spouse are worried about not having housing after hotel stay has ended. Would like assistance with housing. Referral made to [facility]  Arnold Long RN MSN

## 2018-09-16 NOTE — Telephone Encounter (Signed)
Call placed to [facility] to check on status of w/c order. Spoke to [place] who stated that they have received all necessary documents and she will make sure that the specialist reviews the order

## 2018-09-22 NOTE — Progress Notes (Signed)
[place] Screening performed. Temperature, PHQ-9, and need for medical care and medications assessed. Patient reports increased anxiety due to not knowing where he and his wife are going to live after the hotel stay is over. He would like assistance with finding housing. Also, he would like are mental health referral to discuss his anxiety. Referral made to [facility].  Arnold Long RN MSN

## 2018-09-28 ENCOUNTER — Encounter: Payer: Self-pay | Admitting: Licensed Clinical Social Worker

## 2018-09-28 ENCOUNTER — Ambulatory Visit (INDEPENDENT_AMBULATORY_CARE_PROVIDER_SITE_OTHER): Payer: Medicare HMO | Admitting: Licensed Clinical Social Worker

## 2018-09-28 ENCOUNTER — Other Ambulatory Visit: Payer: Self-pay

## 2018-09-28 DIAGNOSIS — F4310 Post-traumatic stress disorder, unspecified: Secondary | ICD-10-CM | POA: Diagnosis not present

## 2018-09-28 DIAGNOSIS — F3160 Bipolar disorder, current episode mixed, unspecified: Secondary | ICD-10-CM | POA: Diagnosis not present

## 2018-09-28 DIAGNOSIS — F1421 Cocaine dependence, in remission: Secondary | ICD-10-CM

## 2018-09-28 NOTE — Progress Notes (Signed)
Virtual Visit via Telephone Note  I connected with Cody Guzman on 09/28/18 at  1:30 PM EDT by telephone and verified that I am speaking with the correct person using two identifiers.   I discussed the limitations, risks, security and privacy concerns of performing an evaluation and management service by telephone and the availability of in person appointments. I also discussed with the patient that there may be a patient responsible charge related to this service. The patient expressed understanding and agreed to proceed.    I discussed the assessment and treatment plan with the patient. The patient was provided an opportunity to ask questions and all were answered. The patient agreed with the plan and demonstrated an understanding of the instructions.   The patient was advised to call back or seek an in-person evaluation if the symptoms worsen or if the condition fails to improve as anticipated.  I provided 25 minutes of non-face-to-face time during this encounter.   *Delta reported being unable to do his session via video due to internet problems.   Alden Hipp, LCSW    THERAPIST PROGRESS NOTE  Session Time: 1330  Participation Level: Minimal  Behavioral Response: NAAlertAnxious  Type of Therapy: Individual Therapy  Treatment Goals addressed: Coping  Interventions: Supportive  Summary: Cody Guzman is a 53 y.o. male who presents with continued symptoms related to his diagnosis. Cody Guzman reports doing well since our last session and states he believes his mood has been more stable. He states he has not gone out of the house often, and "has noticed I'm keeping to myself more than I used to." LCSW asked Cody Guzman why he felt he was staying to himself more frequently. He reported feeling more easily agitated recently and "thought it was just better for everyone if I stayed to myself." LCSW highlighted the insight it requires to recognize signals in your body that you  are feeling more on edge and require space. LCSW encouraged Cody Guzman to utilize other coping mechanisms as well in an effort to stay calm in situations where he is not able to remove himself from the room. Cody Guzman expressed understanding and agreement with this idea. Cody Guzman reported feeling stressed about his living situation as he is currently in temporary [facility] the [facility] in [place]. He is unsure if they will extend his stay through June. LCSW encouraged Cody Guzman to focus on the aspects of the situation he has control over, such as coordinating with the [facility] and looking for alternative housing. Cody Guzman expressed understanding and agreement. LCSW explained how control can be a large part of anxiety/stress, and it is important to recognize the pieces of a situation we can control versus those we cannot. Cody Guzman expressed agreement with this idea.   Suicidal/Homicidal: No  Therapist Response: Cody Guzman continues to work towards his treatment goals but has not yet reached them. We will continue to work on emotional regulation skills and managing anxiety moving forward.   Plan: Return again in 4 weeks.  Diagnosis: Axis I: Bipolar I Disorder    Axis II: No diagnosis    Alden Hipp, LCSW 09/28/2018

## 2018-09-29 NOTE — Progress Notes (Signed)
[place] Screening performed. Temperature, PHQ-9, and need for medical care and medications assessed. Patient reports that he is experiencing increased anxiety that is affecting his relationship with his significant other. He is also anxious about what he is going to do about housing. Referral sent to Daniel Nones RN MSN

## 2018-09-30 ENCOUNTER — Telehealth: Payer: Self-pay

## 2018-09-30 DIAGNOSIS — I429 Cardiomyopathy, unspecified: Secondary | ICD-10-CM

## 2018-09-30 DIAGNOSIS — G8929 Other chronic pain: Secondary | ICD-10-CM

## 2018-09-30 DIAGNOSIS — I509 Heart failure, unspecified: Secondary | ICD-10-CM

## 2018-09-30 NOTE — Telephone Encounter (Signed)
Call received from [place]. She stated that they closed the order for the wheelchair because they did not receive the documentation that is needed from [facility].  Explained to her that the visit note was amended with the required reference to ADLs , mobility and manuel wheelchair. She stated that she received that documentation and that is acceptable; but more information is needed on the order itself. She said that they called [facility] and spoke to "[place]" and faxed the request for the order and never received anything back so they closed the order. Informed her that there is no one named Rosie who works at [facility] and this CM never received any request for additional documentation.   Cody Guzman explained that an order needs to be submitted for a standard wheelchair that includes:  seat cushion, brake lock extensions, elevated leg rests, anti- tippers. The  order must indicate the length of need and the provider's NPI number and it must be " initialed and dated."

## 2018-09-30 NOTE — Telephone Encounter (Signed)
Call placed to [facility] to check on status of wheelchair order. Spoke to Stephanie who said that she does not see that the order has been processed. She will check on call this CM back # [phone]  

## 2018-10-01 NOTE — Telephone Encounter (Signed)
I will leave this on my to do list for next week

## 2018-10-04 ENCOUNTER — Ambulatory Visit (INDEPENDENT_AMBULATORY_CARE_PROVIDER_SITE_OTHER): Payer: Medicare HMO | Admitting: Psychiatry

## 2018-10-04 ENCOUNTER — Other Ambulatory Visit: Payer: Self-pay

## 2018-10-04 DIAGNOSIS — Z5329 Procedure and treatment not carried out because of patient's decision for other reasons: Secondary | ICD-10-CM

## 2018-10-04 NOTE — Progress Notes (Signed)
Attempted to call patient , left VM. No response.

## 2018-10-06 ENCOUNTER — Other Ambulatory Visit: Payer: Self-pay | Admitting: Hematology

## 2018-10-06 DIAGNOSIS — Z20822 Contact with and (suspected) exposure to covid-19: Secondary | ICD-10-CM

## 2018-10-06 NOTE — Progress Notes (Signed)
COVID orders placed for mobile unit /

## 2018-10-07 ENCOUNTER — Other Ambulatory Visit: Payer: Self-pay | Admitting: *Deleted

## 2018-10-07 DIAGNOSIS — Z20822 Contact with and (suspected) exposure to covid-19: Secondary | ICD-10-CM

## 2018-10-11 LAB — NOVEL CORONAVIRUS, NAA: SARS-CoV-2, NAA: NOT DETECTED

## 2018-10-11 NOTE — Telephone Encounter (Signed)
Order placed for wheelchair again.

## 2018-10-11 NOTE — Progress Notes (Signed)
[place] Screening performed. COVID screening, temperature, and need for medical care and medications assessed. Patient agreed to the COVID-19 testing. Patient requests assistance with obtaining medications. Referral sent to [facility].  Arnold Long RN MSN

## 2018-10-12 NOTE — Telephone Encounter (Signed)
Order for manual wheelchair faxed to [place]

## 2018-10-13 NOTE — Progress Notes (Signed)
COVID Hotel Screening performed. COVID screening, temperature, PHQ-9, and need for medical care and medications assessed. No additional needs assessed at this time.  Darika Ildefonso RN MSN 

## 2018-10-14 ENCOUNTER — Telehealth: Payer: Self-pay

## 2018-10-14 NOTE — Telephone Encounter (Signed)
Call placed to [facility] to check on status of wheelchair order.  Spoke to [place] who stated that they never received the order - despite transmission log confirmation.  Order refaxed to [facility]

## 2018-10-14 NOTE — Telephone Encounter (Signed)
As per [facility], they have all documentation needed to process the wheelchair order

## 2018-10-18 ENCOUNTER — Other Ambulatory Visit: Payer: Self-pay

## 2018-10-18 ENCOUNTER — Ambulatory Visit: Payer: Medicare HMO | Admitting: Licensed Clinical Social Worker

## 2018-10-21 ENCOUNTER — Telehealth: Payer: Self-pay

## 2018-10-21 NOTE — Telephone Encounter (Signed)
Call placed to [place], spoke to [place] who explained that they have tried to contact the patient to arrange delivery of the wheelchair but he has not responded. They have left messages for him.  The last attempt to contact him was today.
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
## 2018-10-26 ENCOUNTER — Other Ambulatory Visit: Payer: Self-pay

## 2018-10-26 ENCOUNTER — Encounter: Payer: Self-pay | Admitting: Critical Care Medicine

## 2018-10-26 ENCOUNTER — Ambulatory Visit (HOSPITAL_BASED_OUTPATIENT_CLINIC_OR_DEPARTMENT_OTHER): Payer: Medicare HMO | Admitting: Critical Care Medicine

## 2018-10-26 DIAGNOSIS — E041 Nontoxic single thyroid nodule: Secondary | ICD-10-CM

## 2018-10-26 DIAGNOSIS — I10 Essential (primary) hypertension: Secondary | ICD-10-CM | POA: Diagnosis not present

## 2018-10-26 DIAGNOSIS — I509 Heart failure, unspecified: Secondary | ICD-10-CM | POA: Diagnosis not present

## 2018-10-26 DIAGNOSIS — I2510 Atherosclerotic heart disease of native coronary artery without angina pectoris: Secondary | ICD-10-CM

## 2018-10-26 DIAGNOSIS — F319 Bipolar disorder, unspecified: Secondary | ICD-10-CM | POA: Diagnosis not present

## 2018-10-26 DIAGNOSIS — N182 Chronic kidney disease, stage 2 (mild): Secondary | ICD-10-CM

## 2018-10-26 DIAGNOSIS — Z590 Homelessness: Secondary | ICD-10-CM | POA: Diagnosis not present

## 2018-10-26 DIAGNOSIS — E785 Hyperlipidemia, unspecified: Secondary | ICD-10-CM

## 2018-10-26 DIAGNOSIS — E876 Hypokalemia: Secondary | ICD-10-CM | POA: Diagnosis not present

## 2018-10-26 NOTE — Progress Notes (Signed)
Per pt a week ago he had Migraines and back pain 3 times that week  Per pt his right need and lower left side of his back is hurting   Need refills for his Tramadol

## 2018-11-01 ENCOUNTER — Other Ambulatory Visit: Payer: Medicare HMO

## 2018-11-01 NOTE — Progress Notes (Signed)
COVID Hotel Screening performed. Temperature, PHQ-9, and need for medical care and medications assessed. No additional needs assessed at this time.  Burnell Hurta  MSN, RN 

## 2018-11-03 DIAGNOSIS — I509 Heart failure, unspecified: Secondary | ICD-10-CM | POA: Diagnosis not present

## 2018-11-03 DIAGNOSIS — G8929 Other chronic pain: Secondary | ICD-10-CM | POA: Diagnosis not present

## 2018-11-03 DIAGNOSIS — N182 Chronic kidney disease, stage 2 (mild): Secondary | ICD-10-CM | POA: Diagnosis not present

## 2018-11-03 MED FILL — GABAPENTIN 600 MG TABLET: 600 | 30 days supply | Qty: 45 | Fill #1

## 2018-11-03 MED FILL — QUETIAPINE FUMARATE 50 MG T: 50 | 30 days supply | Qty: 30 | Fill #1

## 2018-11-03 MED FILL — ATORVASTATIN 80 MG TABLET: 80 | 90 days supply | Qty: 90 | Fill #0

## 2018-11-11 ENCOUNTER — Other Ambulatory Visit: Payer: Self-pay | Admitting: *Deleted

## 2018-11-11 DIAGNOSIS — M47816 Spondylosis without myelopathy or radiculopathy, lumbar region: Secondary | ICD-10-CM | POA: Diagnosis not present

## 2018-11-11 DIAGNOSIS — M549 Dorsalgia, unspecified: Secondary | ICD-10-CM | POA: Diagnosis not present

## 2018-11-11 DIAGNOSIS — M25561 Pain in right knee: Secondary | ICD-10-CM | POA: Diagnosis not present

## 2018-11-11 DIAGNOSIS — Z20822 Contact with and (suspected) exposure to covid-19: Secondary | ICD-10-CM

## 2018-11-11 DIAGNOSIS — M899 Disorder of bone, unspecified: Secondary | ICD-10-CM | POA: Diagnosis not present

## 2018-11-11 DIAGNOSIS — M1712 Unilateral primary osteoarthritis, left knee: Secondary | ICD-10-CM | POA: Diagnosis not present

## 2018-11-11 DIAGNOSIS — Z87828 Personal history of other (healed) physical injury and trauma: Secondary | ICD-10-CM | POA: Diagnosis not present

## 2018-11-13 NOTE — Addendum Note (Signed)
Addended by: Brigitte Pulse on: 11/13/2018 10:24 AM   Modules accepted: Orders

## 2018-11-16 ENCOUNTER — Telehealth: Payer: Self-pay | Admitting: Family Medicine

## 2018-11-16 NOTE — Telephone Encounter (Signed)
Patient called stating he is feeling tight chest and fast heart beat. Patient states he cant walk for a longer period of time due to later being out of breath. Please follow up/

## 2018-11-16 NOTE — Telephone Encounter (Signed)
Patients call taken.  Patient identified by name and date of birth.  Patient has a non viable address and is out of certain medications.  Coreg, Lasix, Bidal, and tramadol.  Please leave script with pharmacy so he can pick up.  Patient advised that switch would be made.  Patient acknowledged understanding of advice.

## 2018-11-17 ENCOUNTER — Other Ambulatory Visit: Payer: Self-pay | Admitting: Family Medicine

## 2018-11-17 ENCOUNTER — Other Ambulatory Visit: Payer: Self-pay | Admitting: Critical Care Medicine

## 2018-11-17 DIAGNOSIS — I509 Heart failure, unspecified: Secondary | ICD-10-CM

## 2018-11-17 DIAGNOSIS — M792 Neuralgia and neuritis, unspecified: Secondary | ICD-10-CM

## 2018-11-17 MED ORDER — ISOSORB DINITRATE-HYDRALAZINE 20-37.5 MG PO TABS: 1.0000 | ORAL_TABLET | Freq: Three times a day (TID) | ORAL | 3 refills | Status: DC

## 2018-11-17 MED ORDER — CARVEDILOL 6.25 MG PO TABS: 6.2500 mg | ORAL_TABLET | Freq: Two times a day (BID) | ORAL | 3 refills | Status: DC

## 2018-11-17 MED ORDER — FUROSEMIDE 40 MG PO TABS: 40.0000 mg | ORAL_TABLET | Freq: Every day | ORAL | 3 refills | Status: DC

## 2018-11-17 MED FILL — BIDIL TABLET: 20-37.5 | 90 days supply | Qty: 270 | Fill #0

## 2018-11-17 MED FILL — FUROSEMIDE 40 MG TAB: 40 | 90 days supply | Qty: 90 | Fill #0

## 2018-11-17 MED FILL — traMADol HCL 50 MG TABS: 50 | 7 days supply | Qty: 30 | Fill #0

## 2018-11-17 MED FILL — CARVEDILOL 6.25 MG TABLET: 6.25 | 90 days supply | Qty: 180 | Fill #0

## 2018-11-17 NOTE — Telephone Encounter (Signed)
Meds-Coreg, Bidil and lasix refill. Patient has upcoming appt with Dr. [name] on 11/29/18

## 2018-11-17 NOTE — Progress Notes (Signed)
Patient ID: Cody Guzman, male   DOB: [birth year], 53 y.o.   MRN: [id number]   See phone messages. Patient requesting medication refills for coreg, lasix, Bidil and tramadol. Refills sent to patient's pharmacy for Coreg, lasix and Bidil and he has appointment in office 11/29/18 with Dr. [name]. Patient can discuss tramadol refill at his upcoming appointment.

## 2018-11-17 NOTE — Telephone Encounter (Signed)
Nurse called the patient's home phone number but received no answer and message was left on the voicemail for the patient to call back.  Return phone number given.  Patient advised medications were at the pharmacy.

## 2018-11-18 DIAGNOSIS — M25561 Pain in right knee: Secondary | ICD-10-CM | POA: Diagnosis not present

## 2018-11-25 ENCOUNTER — Ambulatory Visit: Payer: Medicare HMO | Admitting: Critical Care Medicine
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
## 2018-11-29 ENCOUNTER — Ambulatory Visit: Payer: Medicare HMO | Admitting: Critical Care Medicine

## 2018-12-04 DIAGNOSIS — I509 Heart failure, unspecified: Secondary | ICD-10-CM | POA: Diagnosis not present

## 2018-12-04 DIAGNOSIS — G8929 Other chronic pain: Secondary | ICD-10-CM | POA: Diagnosis not present

## 2018-12-04 DIAGNOSIS — N182 Chronic kidney disease, stage 2 (mild): Secondary | ICD-10-CM | POA: Diagnosis not present

## 2018-12-22 MED FILL — FUROSEMIDE 40 MG TAB: 40 | 90 days supply | Qty: 90 | Fill #1

## 2018-12-22 MED FILL — GABAPENTIN 600 MG TABLET: 600 | 30 days supply | Qty: 45 | Fill #2

## 2018-12-22 MED FILL — CARVEDILOL 6.25 MG TABLET: 6.25 | 90 days supply | Qty: 180 | Fill #1

## 2018-12-22 MED FILL — BIDIL TABLET: 20-37.5 | 90 days supply | Qty: 270 | Fill #1

## 2019-01-04 DIAGNOSIS — N182 Chronic kidney disease, stage 2 (mild): Secondary | ICD-10-CM | POA: Diagnosis not present

## 2019-01-04 DIAGNOSIS — I509 Heart failure, unspecified: Secondary | ICD-10-CM | POA: Diagnosis not present

## 2019-01-04 DIAGNOSIS — G8929 Other chronic pain: Secondary | ICD-10-CM | POA: Diagnosis not present

## 2019-01-24 NOTE — Progress Notes (Signed)
COVID-19 Screening performed. Temperature, PHQ-9, and need for medical care and medications assessed. No additional needs assessed at this time.  Jenasis Straley MSN, RN 

## 2019-02-03 DIAGNOSIS — I509 Heart failure, unspecified: Secondary | ICD-10-CM | POA: Diagnosis not present

## 2019-02-03 DIAGNOSIS — N182 Chronic kidney disease, stage 2 (mild): Secondary | ICD-10-CM | POA: Diagnosis not present

## 2019-02-03 DIAGNOSIS — G8929 Other chronic pain: Secondary | ICD-10-CM | POA: Diagnosis not present

## 2019-02-24 ENCOUNTER — Other Ambulatory Visit: Payer: Self-pay | Admitting: Psychiatry

## 2019-02-24 DIAGNOSIS — F3160 Bipolar disorder, current episode mixed, unspecified: Secondary | ICD-10-CM

## 2019-02-24 DIAGNOSIS — F4310 Post-traumatic stress disorder, unspecified: Secondary | ICD-10-CM

## 2019-02-24 MED FILL — GABAPENTIN 600 MG TABLET: 600 | 30 days supply | Qty: 45 | Fill #3

## 2019-03-06 DIAGNOSIS — I509 Heart failure, unspecified: Secondary | ICD-10-CM | POA: Diagnosis not present

## 2019-03-06 DIAGNOSIS — N182 Chronic kidney disease, stage 2 (mild): Secondary | ICD-10-CM | POA: Diagnosis not present

## 2019-03-06 DIAGNOSIS — G8929 Other chronic pain: Secondary | ICD-10-CM | POA: Diagnosis not present

## 2019-03-23 ENCOUNTER — Emergency Department (HOSPITAL_COMMUNITY)
Admission: EM | Admit: 2019-03-23 | Discharge: 2019-03-23 | Disposition: A | Discharge status: Home / Self Care | Payer: Medicare HMO | Attending: Emergency Medicine | Admitting: Emergency Medicine

## 2019-03-23 ENCOUNTER — Other Ambulatory Visit: Payer: Self-pay

## 2019-03-23 ENCOUNTER — Emergency Department (HOSPITAL_COMMUNITY): Payer: Medicare HMO

## 2019-03-23 DIAGNOSIS — Z79899 Other long term (current) drug therapy: Secondary | ICD-10-CM | POA: Diagnosis not present

## 2019-03-23 DIAGNOSIS — I5023 Acute on chronic systolic (congestive) heart failure: Secondary | ICD-10-CM | POA: Diagnosis not present

## 2019-03-23 DIAGNOSIS — F17210 Nicotine dependence, cigarettes, uncomplicated: Secondary | ICD-10-CM | POA: Diagnosis not present

## 2019-03-23 DIAGNOSIS — F1210 Cannabis abuse, uncomplicated: Secondary | ICD-10-CM | POA: Insufficient documentation

## 2019-03-23 DIAGNOSIS — N182 Chronic kidney disease, stage 2 (mild): Secondary | ICD-10-CM | POA: Diagnosis not present

## 2019-03-23 DIAGNOSIS — I2510 Atherosclerotic heart disease of native coronary artery without angina pectoris: Secondary | ICD-10-CM | POA: Diagnosis not present

## 2019-03-23 DIAGNOSIS — I110 Hypertensive heart disease with heart failure: Secondary | ICD-10-CM | POA: Diagnosis not present

## 2019-03-23 DIAGNOSIS — E876 Hypokalemia: Secondary | ICD-10-CM | POA: Diagnosis not present

## 2019-03-23 DIAGNOSIS — R079 Chest pain, unspecified: Secondary | ICD-10-CM | POA: Diagnosis not present

## 2019-03-23 DIAGNOSIS — Z7982 Long term (current) use of aspirin: Secondary | ICD-10-CM | POA: Insufficient documentation

## 2019-03-23 DIAGNOSIS — I509 Heart failure, unspecified: Secondary | ICD-10-CM | POA: Diagnosis not present

## 2019-03-23 DIAGNOSIS — I130 Hypertensive heart and chronic kidney disease with heart failure and stage 1 through stage 4 chronic kidney disease, or unspecified chronic kidney disease: Secondary | ICD-10-CM | POA: Diagnosis not present

## 2019-03-23 DIAGNOSIS — R0602 Shortness of breath: Secondary | ICD-10-CM | POA: Diagnosis not present

## 2019-03-23 LAB — BASIC METABOLIC PANEL
Anion gap: 16 — ABNORMAL HIGH (ref 5–15)
BUN: 9 mg/dL (ref 6–20)
CO2: 18 mmol/L — ABNORMAL LOW (ref 22–32)
Calcium: 9.4 mg/dL (ref 8.9–10.3)
Chloride: 106 mmol/L (ref 98–111)
Creatinine, Ser: 1.18 mg/dL (ref 0.61–1.24)
GFR calc Af Amer: >60 mL/min (ref >60)
GFR calc non Af Amer: >60 mL/min (ref >60)
Glucose, Bld: 126 mg/dL — ABNORMAL HIGH (ref 70–99)
Potassium: 2.9 mmol/L — ABNORMAL LOW (ref 3.5–5.1)
Sodium: 140 mmol/L (ref 135–145)

## 2019-03-23 LAB — CBC
HCT: 41.1 % (ref 39.0–52.0)
Hemoglobin: 13.3 g/dL (ref 13.0–17.0)
MCH: 29.4 pg (ref 26.0–34.0)
MCHC: 32.4 g/dL (ref 30.0–36.0)
MCV: 90.9 fL (ref 80.0–100.0)
Platelets: 186 10*3/uL (ref 150–400)
RBC: 4.52 MIL/uL (ref 4.22–5.81)
RDW: 16.1 % — ABNORMAL HIGH (ref 11.5–15.5)
WBC: 6.6 10*3/uL (ref 4.0–10.5)
nRBC: 0 % (ref 0.0–0.2)

## 2019-03-23 LAB — BRAIN NATRIURETIC PEPTIDE: B Natriuretic Peptide: 2077.1 pg/mL — ABNORMAL HIGH (ref 0.0–100.0)

## 2019-03-23 LAB — TROPONIN I (HIGH SENSITIVITY)
Troponin I (High Sensitivity): 34 ng/L — ABNORMAL HIGH (ref <18)
Troponin I (High Sensitivity): 41 ng/L — ABNORMAL HIGH (ref <18)

## 2019-03-23 MED ORDER — SODIUM CHLORIDE 0.9% FLUSH: 3.0000 mL | Freq: Once | INTRAVENOUS | Status: DC

## 2019-03-23 MED ORDER — POTASSIUM CHLORIDE CRYS ER 20 MEQ PO TBCR
40.0000 meq | EXTENDED_RELEASE_TABLET | Freq: Once | ORAL | Status: AC
  Administered 2019-03-23: 40 meq via ORAL
  Filled 2019-03-23: qty 2

## 2019-03-23 MED ORDER — SPIRONOLACTONE 25 MG PO TABS: 12.5000 mg | ORAL_TABLET | Freq: Every day | ORAL | 0 refills | Status: DC

## 2019-03-23 MED ORDER — FUROSEMIDE 10 MG/ML IJ SOLN
40.0000 mg | Freq: Once | INTRAMUSCULAR | Status: AC
  Administered 2019-03-23: 40 mg via INTRAVENOUS
  Filled 2019-03-23: qty 4

## 2019-03-23 MED ORDER — FUROSEMIDE 40 MG PO TABS: 40.0000 mg | ORAL_TABLET | Freq: Every day | ORAL | 0 refills | Status: DC

## 2019-03-23 MED ORDER — GABAPENTIN 600 MG PO TABS: 300.0000 mg | ORAL_TABLET | Freq: Three times a day (TID) | ORAL | 0 refills | Status: DC

## 2019-03-23 MED ORDER — ASPIRIN-ACETAMINOPHEN-CAFFEINE 250-250-65 MG PO TABS
2.0000 | ORAL_TABLET | Freq: Once | ORAL | Status: AC
  Administered 2019-03-23: 2 via ORAL
  Filled 2019-03-23: qty 2

## 2019-03-23 NOTE — ED Notes (Signed)
Headache medicine given  Pt voiding in good amounts

## 2019-03-23 NOTE — ED Notes (Signed)
Pt has no sob at present

## 2019-03-23 NOTE — Discharge Instructions (Addendum)
You were seen in the emergency department for shortness of breath and increased edema related to you running out of your furosemide for 1 week.  You were given some furosemide here with good diuresis and improvement in your symptoms.  Will be important for you to restart your medications and have close follow-up with your primary care doctor.  Return to the emergency department if any worsening symptoms.

## 2019-03-23 NOTE — ED Notes (Signed)
Discharge instructions discussed with pt. Pt verbalized understanding. Pt stable and ambulatory. No signature pad available. 

## 2019-03-23 NOTE — ED Notes (Signed)
The pt has multiple complaints  He has been out of all his meds and support equipment for a long time  He is c/o bi-;ateral leg pain some sob  He is out of bp meds breathing meds his hhn machine is broken.  No bm for 4 days and his urine stream is slow  Headache also he is out of imitrex.  He has a doctor

## 2019-03-23 NOTE — ED Triage Notes (Signed)
Pt reports being out of lasix for a week. Endorses chest pain for about 2 weeks, leg swelling. Also endorsing stomach pain and N/V.

## 2019-03-23 NOTE — ED Notes (Signed)
He has leg pain ever night when he lies down to sleep
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
## 2019-03-23 NOTE — ED Notes (Signed)
Laughing talking to people on the phone

## 2019-04-05 DIAGNOSIS — G8929 Other chronic pain: Secondary | ICD-10-CM | POA: Diagnosis not present

## 2019-04-05 DIAGNOSIS — N182 Chronic kidney disease, stage 2 (mild): Secondary | ICD-10-CM | POA: Diagnosis not present

## 2019-04-05 DIAGNOSIS — I509 Heart failure, unspecified: Secondary | ICD-10-CM | POA: Diagnosis not present

## 2019-04-12 DIAGNOSIS — H04123 Dry eye syndrome of bilateral lacrimal glands: Secondary | ICD-10-CM | POA: Diagnosis not present

## 2019-04-12 DIAGNOSIS — H524 Presbyopia: Secondary | ICD-10-CM | POA: Diagnosis not present

## 2019-04-13 ENCOUNTER — Other Ambulatory Visit: Payer: Self-pay | Admitting: Psychiatry

## 2019-04-13 ENCOUNTER — Other Ambulatory Visit: Payer: Self-pay | Admitting: Family Medicine

## 2019-04-13 DIAGNOSIS — F4310 Post-traumatic stress disorder, unspecified: Secondary | ICD-10-CM

## 2019-04-13 DIAGNOSIS — M792 Neuralgia and neuritis, unspecified: Secondary | ICD-10-CM

## 2019-04-13 DIAGNOSIS — F3160 Bipolar disorder, current episode mixed, unspecified: Secondary | ICD-10-CM

## 2019-04-13 MED FILL — CARVEDILOL 6.25 MG TABLET: 6.25 | 90 days supply | Qty: 180 | Fill #2

## 2019-04-13 MED FILL — FUROSEMIDE 40 MG TAB: 40 | 90 days supply | Qty: 90 | Fill #2

## 2019-04-13 MED FILL — BIDIL 20-37.5 MG TABS: 20-37.5 | 90 days supply | Qty: 270 | Fill #2

## 2019-04-13 MED FILL — ATORVASTATIN 80 MG TABLET: 80 | 90 days supply | Qty: 90 | Fill #1

## 2019-04-22 ENCOUNTER — Other Ambulatory Visit: Payer: Self-pay

## 2019-04-22 ENCOUNTER — Ambulatory Visit: Payer: Medicare HMO | Attending: Family Medicine | Admitting: Family Medicine

## 2019-04-22 ENCOUNTER — Encounter: Payer: Self-pay | Admitting: Family Medicine

## 2019-04-22 DIAGNOSIS — Z09 Encounter for follow-up examination after completed treatment for conditions other than malignant neoplasm: Secondary | ICD-10-CM

## 2019-04-22 DIAGNOSIS — N182 Chronic kidney disease, stage 2 (mild): Secondary | ICD-10-CM | POA: Diagnosis not present

## 2019-04-22 DIAGNOSIS — I2510 Atherosclerotic heart disease of native coronary artery without angina pectoris: Secondary | ICD-10-CM

## 2019-04-22 DIAGNOSIS — J4520 Mild intermittent asthma, uncomplicated: Secondary | ICD-10-CM

## 2019-04-22 DIAGNOSIS — I509 Heart failure, unspecified: Secondary | ICD-10-CM

## 2019-04-22 DIAGNOSIS — E785 Hyperlipidemia, unspecified: Secondary | ICD-10-CM | POA: Diagnosis not present

## 2019-04-22 DIAGNOSIS — M792 Neuralgia and neuritis, unspecified: Secondary | ICD-10-CM | POA: Diagnosis not present

## 2019-04-22 DIAGNOSIS — I10 Essential (primary) hypertension: Secondary | ICD-10-CM | POA: Diagnosis not present

## 2019-04-22 DIAGNOSIS — F319 Bipolar disorder, unspecified: Secondary | ICD-10-CM | POA: Diagnosis not present

## 2019-04-22 DIAGNOSIS — R3914 Feeling of incomplete bladder emptying: Secondary | ICD-10-CM

## 2019-04-22 DIAGNOSIS — Z1159 Encounter for screening for other viral diseases: Secondary | ICD-10-CM

## 2019-04-22 DIAGNOSIS — E876 Hypokalemia: Secondary | ICD-10-CM

## 2019-04-22 DIAGNOSIS — Z79899 Other long term (current) drug therapy: Secondary | ICD-10-CM

## 2019-04-22 MED ORDER — QUETIAPINE FUMARATE 100 MG PO TABS: 100.0000 mg | ORAL_TABLET | Freq: Every day | ORAL | 1 refills | Status: DC

## 2019-04-22 MED ORDER — ALBUTEROL SULFATE HFA 108 (90 BASE) MCG/ACT IN AERS: 2.0000 | INHALATION_SPRAY | Freq: Four times a day (QID) | RESPIRATORY_TRACT | 11 refills | Status: DC | PRN

## 2019-04-22 MED ORDER — GABAPENTIN 300 MG PO CAPS: 300.0000 mg | ORAL_CAPSULE | Freq: Three times a day (TID) | ORAL | 3 refills | Status: DC

## 2019-04-22 MED ORDER — ISOSORB DINITRATE-HYDRALAZINE 20-37.5 MG PO TABS: 1.0000 | ORAL_TABLET | Freq: Three times a day (TID) | ORAL | 3 refills | Status: DC

## 2019-04-22 MED FILL — ALBUTEROL SULFATE HFA 108 (: 108 (90 BAS | 25 days supply | Qty: 18 | Fill #0

## 2019-04-22 MED FILL — GABAPENTIN 300 MG CAPSULE: 300 | 30 days supply | Qty: 90 | Fill #0

## 2019-04-22 MED FILL — QUETIAPINE FUMARATE 100 MG: 100 | 30 days supply | Qty: 30 | Fill #0

## 2019-04-22 MED FILL — BIDIL 20-37.5 MG TABS: 20-37.5 | 90 days supply | Qty: 270 | Fill #0
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
## 2019-04-22 NOTE — Progress Notes (Signed)
Patient verified DOB Patient has eaten today. Patient has taken medication. Patient complains of back pain at an 8 after taking pain medication today. Patient is requesting Seroquel be refilled. Patient was taking in may of this year.

## 2019-05-03 ENCOUNTER — Other Ambulatory Visit: Payer: Self-pay

## 2019-05-03 ENCOUNTER — Other Ambulatory Visit: Payer: Self-pay | Admitting: Family Medicine

## 2019-05-03 ENCOUNTER — Ambulatory Visit: Payer: Medicare HMO | Attending: Family Medicine

## 2019-05-03 DIAGNOSIS — E785 Hyperlipidemia, unspecified: Secondary | ICD-10-CM

## 2019-05-03 DIAGNOSIS — I509 Heart failure, unspecified: Secondary | ICD-10-CM | POA: Diagnosis not present

## 2019-05-03 DIAGNOSIS — N182 Chronic kidney disease, stage 2 (mild): Secondary | ICD-10-CM

## 2019-05-03 DIAGNOSIS — Z79899 Other long term (current) drug therapy: Secondary | ICD-10-CM

## 2019-05-03 DIAGNOSIS — E876 Hypokalemia: Secondary | ICD-10-CM

## 2019-05-03 DIAGNOSIS — Z1159 Encounter for screening for other viral diseases: Secondary | ICD-10-CM

## 2019-05-03 NOTE — Telephone Encounter (Signed)
D/C on 11/18 due to patient stating no use. Patient is requesting a month later. Please fill of advice.

## 2019-05-04 LAB — COMPREHENSIVE METABOLIC PANEL WITH GFR
ALT: 10 IU/L (ref 0–44)
AST: 19 IU/L (ref 0–40)
Albumin/Globulin Ratio: 1.5 (ref 1.2–2.2)
Albumin: 4.4 g/dL (ref 3.8–4.9)
Alkaline Phosphatase: 108 IU/L (ref 39–117)
BUN/Creatinine Ratio: 11 (ref 9–20)
BUN: 14 mg/dL (ref 6–24)
Bilirubin Total: 0.9 mg/dL (ref 0.0–1.2)
CO2: 24 mmol/L (ref 20–29)
Calcium: 9.7 mg/dL (ref 8.7–10.2)
Chloride: 103 mmol/L (ref 96–106)
Creatinine, Ser: 1.27 mg/dL (ref 0.76–1.27)
GFR calc Af Amer: 73 mL/min/1.73
GFR calc non Af Amer: 63 mL/min/1.73
Globulin, Total: 2.9 g/dL (ref 1.5–4.5)
Glucose: 92 mg/dL (ref 65–99)
Potassium: 3.4 mmol/L — ABNORMAL LOW (ref 3.5–5.2)
Sodium: 142 mmol/L (ref 134–144)
Total Protein: 7.3 g/dL (ref 6.0–8.5)

## 2019-05-04 LAB — HEPATITIS C ANTIBODY: Hep C Virus Ab: <0.1 {s_co_ratio} (ref 0.0–0.9)

## 2019-05-06 DIAGNOSIS — G8929 Other chronic pain: Secondary | ICD-10-CM | POA: Diagnosis not present

## 2019-05-06 DIAGNOSIS — N182 Chronic kidney disease, stage 2 (mild): Secondary | ICD-10-CM | POA: Diagnosis not present

## 2019-05-06 DIAGNOSIS — I509 Heart failure, unspecified: Secondary | ICD-10-CM | POA: Diagnosis not present

## 2019-05-13 ENCOUNTER — Telehealth: Payer: Self-pay | Admitting: Licensed Clinical Social Worker

## 2019-05-13 NOTE — Telephone Encounter (Signed)
Call placed to patient. LCSW informed patient of [facility] referral to assist with establishing behavioral health services.   Pt shared that he obtained housing for both himself and spouse through [place]. Pt requested a print out of the letter PCP completed regarding the justification of service animal 12/2017. LCSW will have letter signed by PCP and keep it at the front for pick-up.   Pt is currently interested in medication management and therapy. LCSW provided pt contact information on [facility], [facility], and [facility] via e-mail on file. Pt verbalized that he will initiate services after researching agencies. He is currently participating in [facility] regarding peer support training.   No additional concerns noted.

## 2019-06-06 DIAGNOSIS — I509 Heart failure, unspecified: Secondary | ICD-10-CM | POA: Diagnosis not present

## 2019-06-06 DIAGNOSIS — G8929 Other chronic pain: Secondary | ICD-10-CM | POA: Diagnosis not present

## 2019-06-06 DIAGNOSIS — N182 Chronic kidney disease, stage 2 (mild): Secondary | ICD-10-CM | POA: Diagnosis not present

## 2019-07-04 DIAGNOSIS — N182 Chronic kidney disease, stage 2 (mild): Secondary | ICD-10-CM | POA: Diagnosis not present

## 2019-07-04 DIAGNOSIS — I509 Heart failure, unspecified: Secondary | ICD-10-CM | POA: Diagnosis not present

## 2019-07-04 DIAGNOSIS — G8929 Other chronic pain: Secondary | ICD-10-CM | POA: Diagnosis not present

## 2019-07-06 MED FILL — CARVEDILOL 6.25 MG TABLET: 6.25 | 90 days supply | Qty: 180 | Fill #3

## 2019-07-06 MED FILL — GABAPENTIN 300 MG CAPSULE: 300 | 30 days supply | Qty: 90 | Fill #1

## 2019-07-06 MED FILL — ALBUTEROL SULFATE HFA 108 (: 108 (90 BAS | 25 days supply | Qty: 18 | Fill #1

## 2019-07-06 MED FILL — FUROSEMIDE 40 MG TAB: 40 | 90 days supply | Qty: 90 | Fill #3

## 2019-07-06 MED FILL — ATORVASTATIN 80 MG TABLET: 80 | 90 days supply | Qty: 90 | Fill #2

## 2019-07-06 MED FILL — QUETIAPINE FUMARATE 100 MG: 100 | 30 days supply | Qty: 30 | Fill #1

## 2019-08-16 MED FILL — GABAPENTIN 300 MG CAPSULE: 300 | 30 days supply | Qty: 90 | Fill #2

## 2019-08-16 MED FILL — NITROGLYCERIN 0.4 MG TAB SL: 0.4 | 25 days supply | Qty: 25 | Fill #0

## 2019-09-16 ENCOUNTER — Other Ambulatory Visit: Payer: Self-pay | Admitting: Family Medicine

## 2019-09-16 ENCOUNTER — Other Ambulatory Visit: Payer: Self-pay | Admitting: Critical Care Medicine

## 2019-09-16 DIAGNOSIS — M792 Neuralgia and neuritis, unspecified: Secondary | ICD-10-CM

## 2019-09-16 DIAGNOSIS — I509 Heart failure, unspecified: Secondary | ICD-10-CM

## 2019-09-26 ENCOUNTER — Encounter: Payer: Self-pay | Admitting: Family Medicine

## 2019-09-26 ENCOUNTER — Other Ambulatory Visit: Payer: Self-pay

## 2019-09-26 ENCOUNTER — Ambulatory Visit: Payer: Medicare Other | Attending: Family Medicine | Admitting: Family Medicine

## 2019-09-26 VITALS — BP 116/77 | HR 87 | Ht 68.0 in | Wt 246.6 lb

## 2019-09-26 DIAGNOSIS — F319 Bipolar disorder, unspecified: Secondary | ICD-10-CM

## 2019-09-26 DIAGNOSIS — I509 Heart failure, unspecified: Secondary | ICD-10-CM | POA: Diagnosis not present

## 2019-09-26 DIAGNOSIS — Z720 Tobacco use: Secondary | ICD-10-CM

## 2019-09-26 DIAGNOSIS — J4520 Mild intermittent asthma, uncomplicated: Secondary | ICD-10-CM

## 2019-09-26 DIAGNOSIS — E876 Hypokalemia: Secondary | ICD-10-CM | POA: Diagnosis not present

## 2019-09-26 DIAGNOSIS — I10 Essential (primary) hypertension: Secondary | ICD-10-CM | POA: Diagnosis not present

## 2019-09-26 DIAGNOSIS — B351 Tinea unguium: Secondary | ICD-10-CM

## 2019-09-26 DIAGNOSIS — R7309 Other abnormal glucose: Secondary | ICD-10-CM | POA: Diagnosis not present

## 2019-09-26 DIAGNOSIS — N529 Male erectile dysfunction, unspecified: Secondary | ICD-10-CM

## 2019-09-26 DIAGNOSIS — R200 Anesthesia of skin: Secondary | ICD-10-CM

## 2019-09-26 DIAGNOSIS — G8929 Other chronic pain: Secondary | ICD-10-CM

## 2019-09-26 DIAGNOSIS — M5441 Lumbago with sciatica, right side: Secondary | ICD-10-CM

## 2019-09-26 DIAGNOSIS — M792 Neuralgia and neuritis, unspecified: Secondary | ICD-10-CM

## 2019-09-26 DIAGNOSIS — Z79899 Other long term (current) drug therapy: Secondary | ICD-10-CM

## 2019-09-26 DIAGNOSIS — M5442 Lumbago with sciatica, left side: Secondary | ICD-10-CM

## 2019-09-26 DIAGNOSIS — M25521 Pain in right elbow: Secondary | ICD-10-CM

## 2019-09-26 DIAGNOSIS — M79674 Pain in right toe(s): Secondary | ICD-10-CM

## 2019-09-26 MED ORDER — CARVEDILOL 6.25 MG PO TABS: 6.2500 mg | ORAL_TABLET | Freq: Two times a day (BID) | ORAL | 1 refills | Status: DC

## 2019-09-26 MED ORDER — ALBUTEROL SULFATE HFA 108 (90 BASE) MCG/ACT IN AERS: 2.0000 | INHALATION_SPRAY | Freq: Four times a day (QID) | RESPIRATORY_TRACT | 11 refills | Status: DC | PRN

## 2019-09-26 MED ORDER — TRAMADOL HCL 50 MG PO TABS: 50.0000 mg | ORAL_TABLET | Freq: Four times a day (QID) | ORAL | 0 refills | Status: DC | PRN

## 2019-09-26 MED ORDER — SPIRONOLACTONE 25 MG PO TABS: 12.5000 mg | ORAL_TABLET | Freq: Every day | ORAL | 1 refills | Status: DC

## 2019-09-26 MED ORDER — ISOSORB DINITRATE-HYDRALAZINE 20-37.5 MG PO TABS: 1.0000 | ORAL_TABLET | Freq: Three times a day (TID) | ORAL | 1 refills | Status: DC

## 2019-09-26 MED ORDER — GABAPENTIN 300 MG PO CAPS: 300.0000 mg | ORAL_CAPSULE | Freq: Three times a day (TID) | ORAL | 3 refills | Status: DC

## 2019-09-26 MED ORDER — FUROSEMIDE 40 MG PO TABS: 40.0000 mg | ORAL_TABLET | Freq: Every day | ORAL | 0 refills | Status: DC

## 2019-09-26 MED FILL — CARVEDILOL 6.25 MG TABLET: 6.25 | 90 days supply | Qty: 180 | Fill #0

## 2019-09-26 MED FILL — traMADol HCL 50 MG TABS: 50 | 7 days supply | Qty: 30 | Fill #0

## 2019-09-26 MED FILL — FUROSEMIDE 40 MG TAB: 40 | 30 days supply | Qty: 30 | Fill #0

## 2019-09-26 MED FILL — SPIRONOLACTONE 25 MG TABLET: 25 | 90 days supply | Qty: 45 | Fill #0

## 2019-09-26 MED FILL — BIDIL 20-37.5 MG TABS: 20-37.5 | 90 days supply | Qty: 270 | Fill #0
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
## 2019-09-26 NOTE — Progress Notes (Signed)
Patient has not been taking medication as he should.  Patient is having pain and numbness in his fingers and legs and feet.  Having trouble with erectile dysfunction.   Has knot on elbow.

## 2019-09-27 ENCOUNTER — Encounter: Payer: Self-pay | Admitting: *Deleted

## 2019-09-27 LAB — BASIC METABOLIC PANEL WITH GFR
BUN/Creatinine Ratio: 8 — ABNORMAL LOW (ref 9–20)
BUN: 9 mg/dL (ref 6–24)
CO2: 19 mmol/L — ABNORMAL LOW (ref 20–29)
Calcium: 9.4 mg/dL (ref 8.7–10.2)
Chloride: 110 mmol/L — ABNORMAL HIGH (ref 96–106)
Creatinine, Ser: 1.19 mg/dL (ref 0.76–1.27)
GFR calc Af Amer: 79 mL/min/1.73
GFR calc non Af Amer: 68 mL/min/1.73
Glucose: 85 mg/dL (ref 65–99)
Potassium: 3.5 mmol/L (ref 3.5–5.2)
Sodium: 146 mmol/L — ABNORMAL HIGH (ref 134–144)

## 2019-09-27 LAB — HEMOGLOBIN A1C
Est. average glucose Bld gHb Est-mCnc: 117 mg/dL
Hgb A1c MFr Bld: 5.7 % — ABNORMAL HIGH (ref 4.8–5.6)

## 2019-09-28 ENCOUNTER — Other Ambulatory Visit: Payer: Self-pay | Admitting: Family Medicine

## 2019-09-28 DIAGNOSIS — E790 Hyperuricemia without signs of inflammatory arthritis and tophaceous disease: Secondary | ICD-10-CM

## 2019-09-28 DIAGNOSIS — M1A9XX1 Chronic gout, unspecified, with tophus (tophi): Secondary | ICD-10-CM

## 2019-09-28 LAB — SPECIMEN STATUS REPORT

## 2019-09-28 LAB — URIC ACID: Uric Acid: 8.7 mg/dL — ABNORMAL HIGH (ref 3.8–8.4)

## 2019-10-03 ENCOUNTER — Encounter: Payer: Self-pay | Admitting: Family Medicine

## 2019-10-07 MED FILL — BIDIL 20-37.5 MG TABS: 20-37.5 | 90 days supply | Qty: 270 | Fill #0

## 2019-10-14 ENCOUNTER — Ambulatory Visit (INDEPENDENT_AMBULATORY_CARE_PROVIDER_SITE_OTHER): Payer: Medicare Other | Admitting: Podiatry

## 2019-10-14 ENCOUNTER — Other Ambulatory Visit: Payer: Self-pay

## 2019-10-14 DIAGNOSIS — Q828 Other specified congenital malformations of skin: Secondary | ICD-10-CM

## 2019-10-14 DIAGNOSIS — B351 Tinea unguium: Secondary | ICD-10-CM

## 2019-10-14 MED ORDER — CICLOPIROX 8 % EX SOLN: Freq: Every day | CUTANEOUS | 0 refills | Status: DC

## 2019-10-14 NOTE — Progress Notes (Signed)
  Subjective:  Patient ID: Cody Guzman, male    DOB: May 21, 1965,  MRN: [id number]  Chief Complaint  Patient presents with  . Nail Problem    Nail trim 1-5 bilateral, pt wants antifungal treatment.   54 y.o. male presents with the above complaint. History confirmed with patient.   Objective:  Physical Exam: warm, good capillary refill, nail exam onychomycosis of the toenails, worst at right hallux, no trophic changes or ulcerative lesions. DP pulses palpable, PT pulses palpable and protective sensation intact Left Foot: normal exam, no swelling, tenderness, instability; ligaments intact, full range of motion of all ankle/foot joints  Punctate keratosis mid arch. Right Foot: normal exam, no swelling, tenderness, instability; ligaments intact, full range of motion of all ankle/foot joints   No images are attached to the encounter.  Assessment:   1. Onychomycosis   2. Porokeratosis      Plan:  Patient was evaluated and treated and all questions answered.  Onychomycosis -Educated on etiology -Nails debrided, courtesy. Does not meet criteria for routine debridement -Rx [place]  Porokeratosis -Debrided  No follow-ups on file.

## 2019-10-28 ENCOUNTER — Other Ambulatory Visit: Payer: Self-pay | Admitting: Family Medicine

## 2019-10-28 DIAGNOSIS — I509 Heart failure, unspecified: Secondary | ICD-10-CM

## 2019-10-28 DIAGNOSIS — I10 Essential (primary) hypertension: Secondary | ICD-10-CM

## 2019-10-28 MED FILL — FUROSEMIDE 40 MG TAB: 40 | 30 days supply | Qty: 30 | Fill #0

## 2019-11-03 MED FILL — GABAPENTIN 300 MG CAPSULE: 300 | 30 days supply | Qty: 90 | Fill #0

## 2019-11-28 ENCOUNTER — Ambulatory Visit: Payer: Medicare (Managed Care) | Attending: Family Medicine | Admitting: Licensed Clinical Social Worker

## 2019-11-28 ENCOUNTER — Other Ambulatory Visit: Payer: Self-pay

## 2019-11-28 DIAGNOSIS — F411 Generalized anxiety disorder: Secondary | ICD-10-CM

## 2019-11-28 NOTE — BH Specialist Note (Signed)
[place] Visit via Telemedicine (Telephone)  11/28/2019 Cody Guzman [id number]   Session Start time: 10:34 AM  Session End time: 10:54 AM Total time: 20  Referring Provider: [name] Type of Visit: Telephonic Patient location: [facility] Provider location: Office All persons participating in visit: LCSW and Patient  Confirmed patient's address: Yes  Confirmed patient's phone number: Yes  Any changes to demographics: No   Confirmed patient's insurance: Yes  Any changes to patient's insurance: No   Discussed confidentiality: Yes    The following statements were read to the patient and/or legal guardian that are established with the [facility] Provider.  "The purpose of this phone visit is to provide behavioral health care while limiting exposure to the coronavirus (COVID19).  There is a possibility of technology failure and discussed alternative modes of communication if that failure occurs."  "By engaging in this telephone visit, you consent to the provision of healthcare.  Additionally, you authorize for your insurance to be billed for the services provided during this telephone visit."   Patient and/or legal guardian consented to telephone visit: Yes   PRESENTING CONCERNS: Patient and/or family reports the following symptoms/concerns: Pt reports difficulty managing Intermittent Explosive Disorder noting that he has not been on prescribed medication (Lithium) for over a year. The medications prescribed by [facility] weren't helpful. Pt reports increase in irritability symptoms and panic attacks. He is interested in initiating services through [facility] Duration of problem: Ongoing; Severity of problem: severe  STRENGTHS (Protective Factors/Coping Skills): Pt receives strong support from spouse Pt has good insight Pt has desire to change and is open to initiating medication management  GOALS ADDRESSED: Patient will: 1.  Reduce symptoms of: agitation  and anxiety  2.  Increase knowledge and/or ability of: coping skills and healthy habits  3.  Demonstrate ability to: Increase healthy adjustment to current life circumstances and Increase adequate support systems for patient/family  INTERVENTIONS: Interventions utilized:  Solution-Focused Strategies and Supportive Counseling Standardized Assessments completed: Not Needed  ASSESSMENT: Patient currently experiencing increase in irritability and panic attacks triggered by unmanaged mental health conditions. Pt denies SI/HI.    Patient may benefit from medication management and therapy. Pt was successful in identifying warning signs to anger and healthy coping skills to manage strong episodes of anger. Pt requested referral to [facility]. He also needs a new referral to Urology that accepts his insurance. LCSW will notify Provider of upcoming appointment.   PLAN: 1. Follow up with behavioral health clinician on : Contact LCSW with any additional behavioral health and/or resource needs 2. Behavioral recommendations: Utilize healthy strategies discussed 3. Referral(s): [facility] (In Clinic)  Cody Guzman, [place] 11/28/2019 11:49 AM

## 2019-11-29 ENCOUNTER — Other Ambulatory Visit: Payer: Self-pay | Admitting: Critical Care Medicine

## 2019-11-29 DIAGNOSIS — Z5900 Homelessness unspecified: Secondary | ICD-10-CM

## 2019-11-29 DIAGNOSIS — F319 Bipolar disorder, unspecified: Secondary | ICD-10-CM

## 2019-12-07 ENCOUNTER — Other Ambulatory Visit: Payer: Self-pay | Admitting: Family Medicine

## 2019-12-07 DIAGNOSIS — I10 Essential (primary) hypertension: Secondary | ICD-10-CM

## 2019-12-07 DIAGNOSIS — I509 Heart failure, unspecified: Secondary | ICD-10-CM

## 2019-12-07 MED FILL — FUROSEMIDE 40 MG TAB: 40 | 30 days supply | Qty: 30 | Fill #0

## 2019-12-07 MED FILL — GABAPENTIN 300 MG CAPSULE: 300 | 30 days supply | Qty: 90 | Fill #1

## 2019-12-13 ENCOUNTER — Encounter: Payer: Self-pay | Admitting: Critical Care Medicine

## 2019-12-13 ENCOUNTER — Ambulatory Visit: Payer: Medicare (Managed Care) | Attending: Critical Care Medicine | Admitting: Critical Care Medicine

## 2019-12-13 DIAGNOSIS — F319 Bipolar disorder, unspecified: Secondary | ICD-10-CM

## 2019-12-13 DIAGNOSIS — J4520 Mild intermittent asthma, uncomplicated: Secondary | ICD-10-CM

## 2019-12-13 DIAGNOSIS — E7800 Pure hypercholesterolemia, unspecified: Secondary | ICD-10-CM

## 2019-12-13 DIAGNOSIS — R200 Anesthesia of skin: Secondary | ICD-10-CM

## 2019-12-13 DIAGNOSIS — F4310 Post-traumatic stress disorder, unspecified: Secondary | ICD-10-CM

## 2019-12-13 DIAGNOSIS — I509 Heart failure, unspecified: Secondary | ICD-10-CM

## 2019-12-13 DIAGNOSIS — M792 Neuralgia and neuritis, unspecified: Secondary | ICD-10-CM

## 2019-12-13 DIAGNOSIS — G8929 Other chronic pain: Secondary | ICD-10-CM

## 2019-12-13 DIAGNOSIS — I10 Essential (primary) hypertension: Secondary | ICD-10-CM

## 2019-12-13 DIAGNOSIS — M5442 Lumbago with sciatica, left side: Secondary | ICD-10-CM

## 2019-12-13 DIAGNOSIS — M5441 Lumbago with sciatica, right side: Secondary | ICD-10-CM

## 2019-12-13 DIAGNOSIS — B351 Tinea unguium: Secondary | ICD-10-CM

## 2019-12-13 DIAGNOSIS — M79674 Pain in right toe(s): Secondary | ICD-10-CM

## 2019-12-13 MED ORDER — QUETIAPINE FUMARATE 100 MG PO TABS: 100.0000 mg | ORAL_TABLET | Freq: Every day | ORAL | 1 refills | Status: DC

## 2019-12-13 MED ORDER — SENNOSIDES-DOCUSATE SODIUM 8.6-50 MG PO TABS: 1.0000 | ORAL_TABLET | Freq: Two times a day (BID) | ORAL | 3 refills | Status: DC

## 2019-12-13 MED ORDER — ATORVASTATIN CALCIUM 80 MG PO TABS: 80.0000 mg | ORAL_TABLET | Freq: Every day | ORAL | 4 refills | Status: DC

## 2019-12-13 MED ORDER — ALLOPURINOL 100 MG PO TABS: 100.0000 mg | ORAL_TABLET | Freq: Every day | ORAL | 3 refills | Status: DC

## 2019-12-13 MED ORDER — ISOSORB DINITRATE-HYDRALAZINE 20-37.5 MG PO TABS: 1.0000 | ORAL_TABLET | Freq: Three times a day (TID) | ORAL | 1 refills | Status: DC

## 2019-12-13 MED ORDER — ALBUTEROL SULFATE HFA 108 (90 BASE) MCG/ACT IN AERS: 2.0000 | INHALATION_SPRAY | Freq: Four times a day (QID) | RESPIRATORY_TRACT | 11 refills | Status: DC | PRN

## 2019-12-13 MED ORDER — FUROSEMIDE 40 MG PO TABS: 40.0000 mg | ORAL_TABLET | Freq: Every day | ORAL | 0 refills | Status: DC

## 2019-12-13 MED ORDER — CARVEDILOL 6.25 MG PO TABS: 6.2500 mg | ORAL_TABLET | Freq: Two times a day (BID) | ORAL | 1 refills | Status: DC

## 2019-12-13 MED ORDER — COLCHICINE 0.6 MG PO TABS: 0.6000 mg | ORAL_TABLET | ORAL | 4 refills | Status: DC

## 2019-12-13 MED ORDER — GABAPENTIN 300 MG PO CAPS: 300.0000 mg | ORAL_CAPSULE | Freq: Three times a day (TID) | ORAL | 3 refills | Status: DC

## 2019-12-13 MED ORDER — SPIRONOLACTONE 25 MG PO TABS: 12.5000 mg | ORAL_TABLET | Freq: Every day | ORAL | 1 refills | Status: DC

## 2019-12-13 MED ORDER — TRAMADOL HCL 50 MG PO TABS: 50.0000 mg | ORAL_TABLET | Freq: Four times a day (QID) | ORAL | 0 refills | Status: DC | PRN

## 2019-12-13 MED FILL — ATORVASTATIN 80 MG TABLET: 80 | 30 days supply | Qty: 30 | Fill #0

## 2019-12-13 MED FILL — SPIRONOLACTONE 25 MG TABLET: 25 | 90 days supply | Qty: 45 | Fill #0

## 2019-12-13 MED FILL — QUETIAPINE FUMARATE 100 MG: 100 | 30 days supply | Qty: 30 | Fill #0

## 2019-12-13 MED FILL — traMADol HCL 50 MG TABS: 50 | 7 days supply | Qty: 7 | Fill #0

## 2019-12-13 MED FILL — CARVEDILOL 6.25 MG TABLET: 6.25 | 90 days supply | Qty: 180 | Fill #0
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
## 2019-12-14 MED FILL — BIDIL 20-37.5 MG TABS: 20-37.5 | 30 days supply | Qty: 90 | Fill #0

## 2019-12-14 MED FILL — ALBUTEROL SULFATE HFA 108 (: 108 (90 BAS | 18 days supply | Qty: 18 | Fill #0

## 2019-12-17 IMAGING — CT CT ABD-PELV W/ CM
3 of 11 series · 11 of 46 positions shown, 16 images · IV contrast (OMNIPAQUE)
Comparison: None.

CLINICAL DATA: 52 y/o M; body aches, congestion, and cough for 4-5
days.

EXAM:
CT ANGIOGRAPHY CHEST
CT ABDOMEN AND PELVIS WITH CONTRAST
TECHNIQUE: Multidetector CT imaging of the chest was performed using the
standard protocol during bolus administration of intravenous
contrast. Multiplanar CT image reconstructions and MIPs were
obtained to evaluate the vascular anatomy. Multidetector CT imaging
of the abdomen and pelvis was performed using the standard protocol
during bolus administration of intravenous contrast.
CONTRAST:  140 cc OMNIPAQUE IOHEXOL 350 MG/ML SOLN

[Series 5: thins · axial · 0.75mm/px · z∈[+1341,+1551]mm · 7 of 288 slices shown]
[im 20/288  soft-tissue]
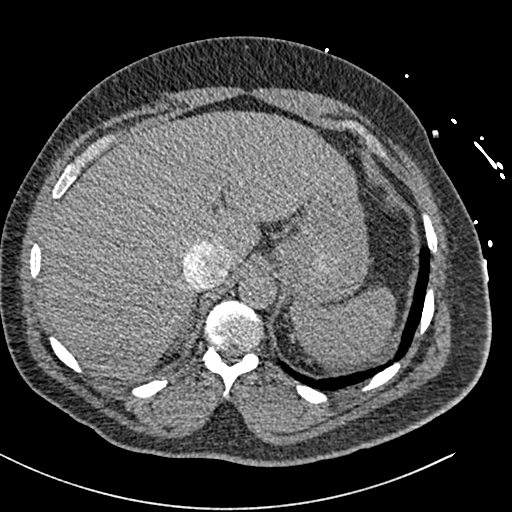
[im 58/288  soft-tissue]
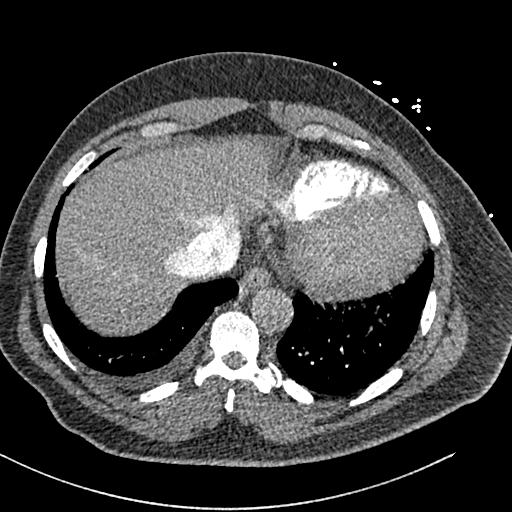
[im 96/288  soft-tissue]
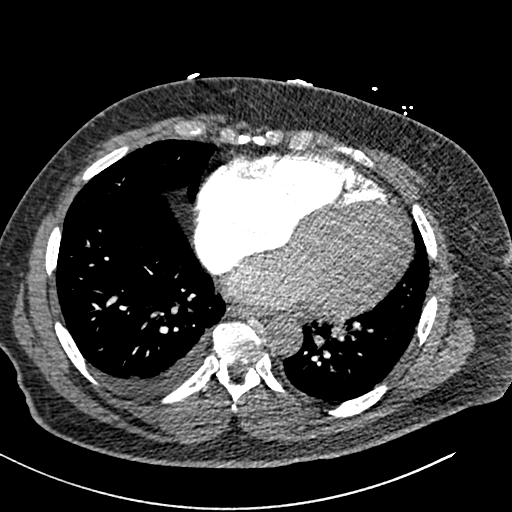
[im 134/288  soft-tissue]
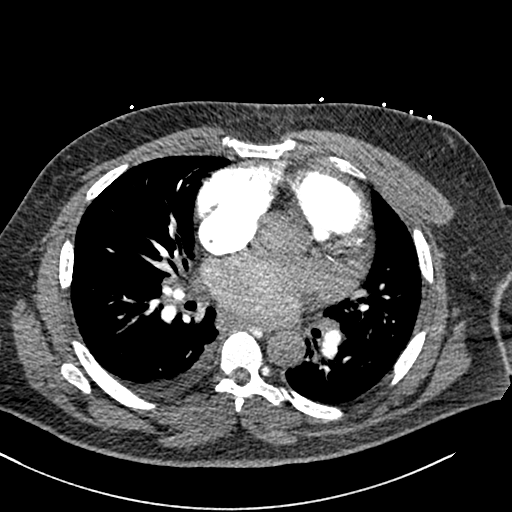
[im 154/288  soft-tissue]
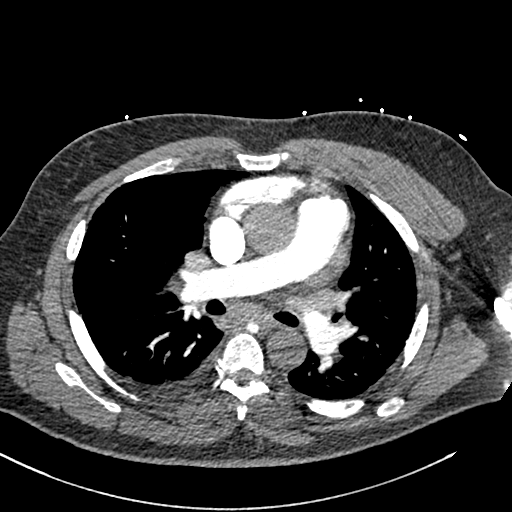
[im 192/288  soft-tissue]
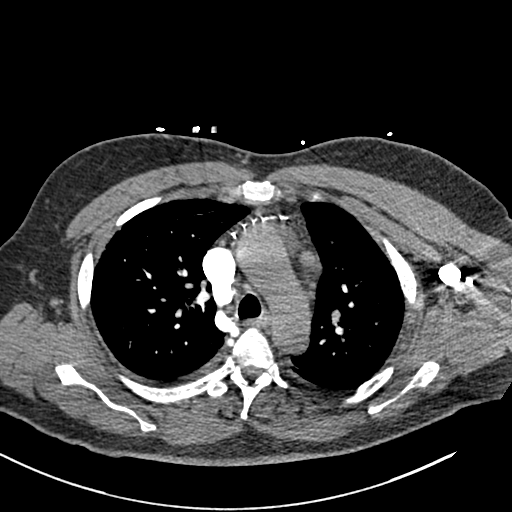
[im 230/288  soft-tissue]
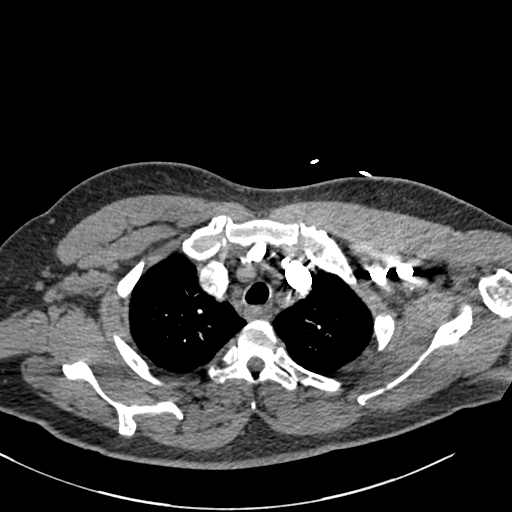

[Series 6: coronal mpr · coronal · 0.55mm/px · 1 of 127 slices shown, 2 images]
[im 64/127  soft-tissue]
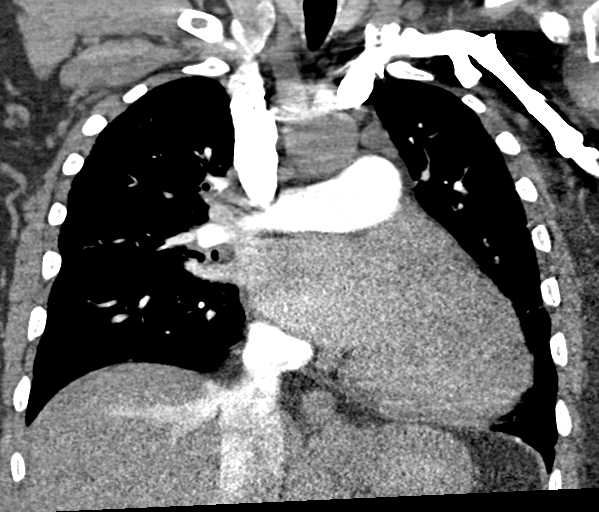
[im 64/127  bone]
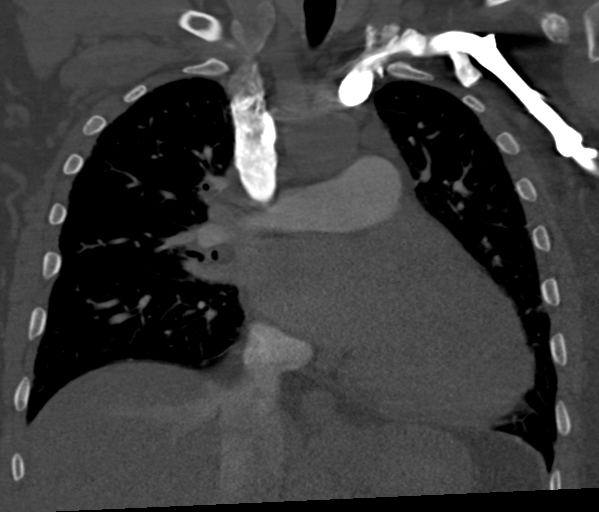

[Series 11: axial st · axial · 0.74mm/px · z∈[+1082,+1317]mm · 3 of 95 slices shown, 7 images]
[im 24/95  soft-tissue]
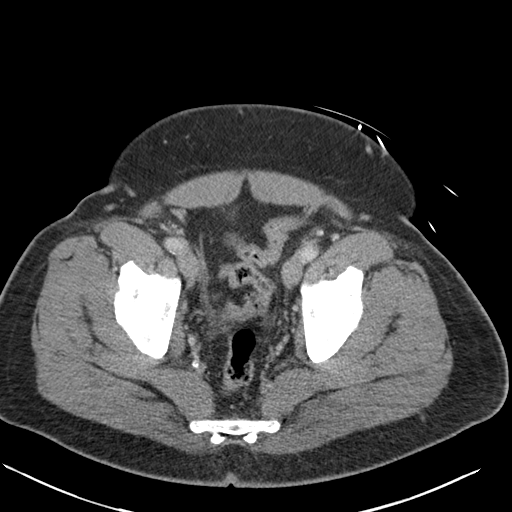
[im 24/95  lung]
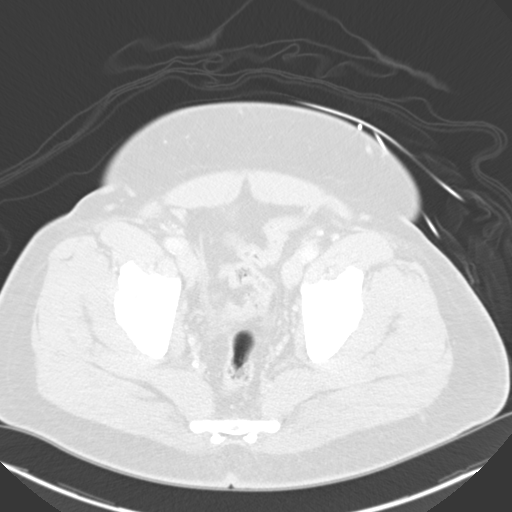
[im 24/95  bone]
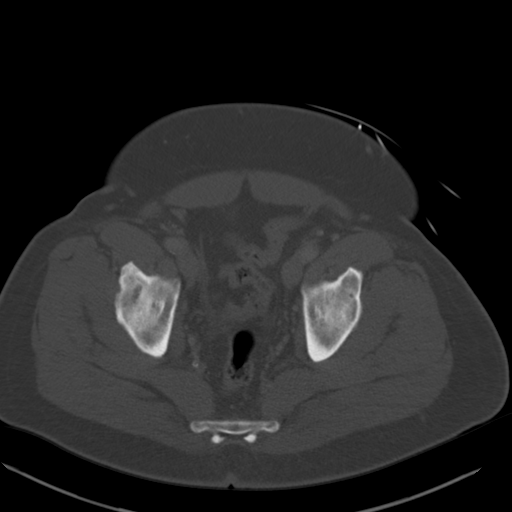
[im 48/95  soft-tissue]
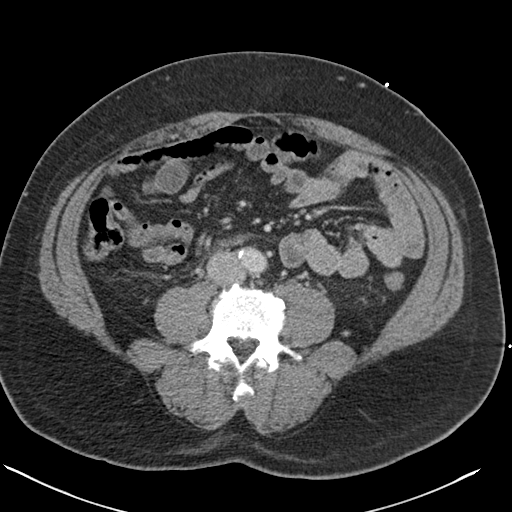
[im 48/95  lung]
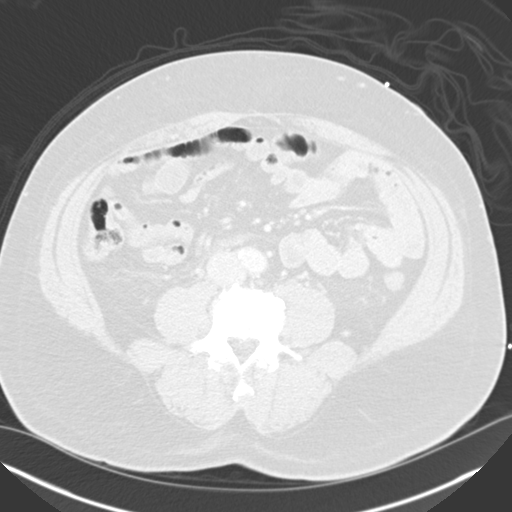
[im 71/95  soft-tissue]
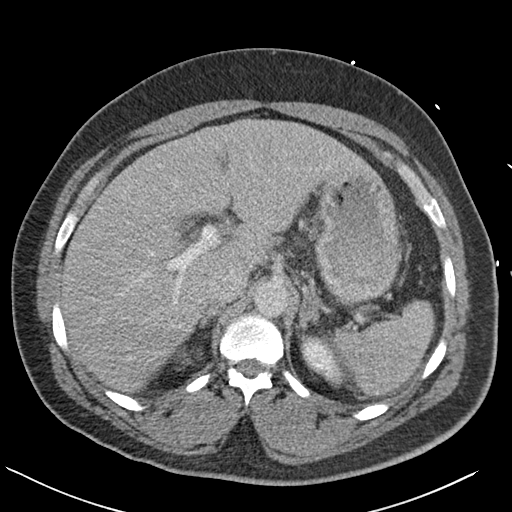
[im 71/95  lung]
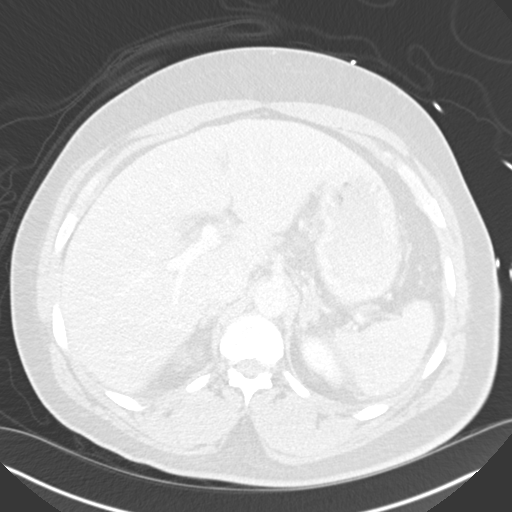

[11 of 46 positions shown; findings below may reference images not displayed]

FINDINGS: CTA CHEST FINDINGS

Cardiovascular: Cardiomegaly. No pericardial effusion. Normal
caliber thoracic aorta. Main pulmonary artery measures 3.5 cm. Mild
to moderate coronary artery calcific atherosclerosis. Satisfactory
opacification of the pulmonary arteries. No pulmonary embolus
identified.

Mediastinum/Nodes: Mildly enlarged mediastinal lymph nodes, for
example a prevascular node measuring 23 x 15 mm (series 4, image 32)
and a right upper paratracheal lymph node measuring 15 x 16 mm
(series 4, image 25). No axillary adenopathy. 18 mm nodule in the
right lobe of the thyroid gland. Normal thoracic esophagus.

Lungs/Pleura: Smooth interlobular septal thickening compatible with
interstitial edema. Small right pleural effusion. No consolidation
or pneumothorax. Few scattered pulmonary nodules measuring up to 6 x
4 mm, mean 5 mm in the left upper lobe (series 10, image 64) and
additional smaller nodules on the right minor fissure.

Musculoskeletal: No chest wall abnormality. No acute or significant
osseous findings.

Review of the MIP images confirms the above findings.

CT ABDOMEN and PELVIS FINDINGS

Hepatobiliary: No focal liver abnormality is seen. Gallbladder wall
thickening. No radiopaque gallstone or biliary ductal dilatation.

Pancreas: Unremarkable. No pancreatic ductal dilatation or
surrounding inflammatory changes.

Spleen: Normal in size without focal abnormality.

Adrenals/Urinary Tract: Indeterminate 10 mm right adrenal nodule
(series 11, image 29). Multiple subcentimeter hypodensities within
the renal cortices, compatible with cysts otherwise. Kidneys are
normal, without renal calculi, focal lesion, or hydronephrosis.
Bladder is unremarkable.

Stomach/Bowel: Stomach is within normal limits. Appendix appears
normal. No evidence of bowel wall thickening, distention, or
inflammatory changes. Duodenum diverticula arising from the third
segment of the duodenum, no associated inflammatory changes.

Vascular/Lymphatic: Aortic atherosclerosis. No enlarged abdominal or
pelvic lymph nodes.

Reproductive: Prostate is unremarkable.

Other: No abdominal wall hernia or abnormality. No abdominopelvic
ascites.

Musculoskeletal: No fracture is seen.

Review of the MIP images confirms the above findings.
IMPRESSION: CTA chest:

1. No pulmonary embolus identified.
2. Cardiomegaly, interstitial pulmonary edema, small right pleural
effusion.
3. Enlarged main pulmonary artery indicates pulmonary artery
hypertension.
4. Multiple pulmonary nodules measuring up to 5 mm. No follow-up
needed if patient is low-risk (and has no known or suspected primary
neoplasm). Non-contrast chest CT can be considered in 12 months if
patient is high-risk. This recommendation follows the consensus
statement: Guidelines for Management of Incidental Pulmonary Nodules
Detected on CT Images: From the [HOSPITAL] 4124; Radiology
4124; [DATE].
5. 18 mm nodule in right lobe of thyroid gland. Thyroid ultrasound
is recommended on a nonemergent basis.

CT abdomen and pelvis:

1. Gallbladder wall thickening. No radiopaque gallstone. No biliary
ductal dilatation. Findings are nonspecific and may represent
cholecystitis, reactive changes due to local inflammation, heart
failure, or hypoproteinemia. Consider right upper quadrant
ultrasound to assess for radiolucent gallstones.
2. Indeterminate 10 mm right adrenal nodule. In the absence of
primary malignancy this is probably benign. Consider 12 month
follow-up with adrenal CT. This recommendation follows ACR consensus
guidelines: Management of Incidental Adrenal Masses: A White Paper
of the.

## 2020-01-20 ENCOUNTER — Ambulatory Visit (INDEPENDENT_AMBULATORY_CARE_PROVIDER_SITE_OTHER): Payer: Medicare Other | Admitting: Podiatry

## 2020-01-20 DIAGNOSIS — B351 Tinea unguium: Secondary | ICD-10-CM

## 2020-01-20 MED FILL — BIDIL 20-37.5 MG TABS: 20-37.5 | 30 days supply | Qty: 90 | Fill #0

## 2020-01-20 MED FILL — FUROSEMIDE 40 MG TAB: 40 | 30 days supply | Qty: 30 | Fill #0

## 2020-01-20 MED FILL — GABAPENTIN 300 MG CAPSULE: 300 | 90 days supply | Qty: 270 | Fill #0

## 2020-01-20 NOTE — Progress Notes (Signed)
No show for appt. 

## 2020-02-24 ENCOUNTER — Other Ambulatory Visit: Payer: Self-pay | Admitting: Critical Care Medicine

## 2020-02-24 ENCOUNTER — Other Ambulatory Visit: Payer: Self-pay | Admitting: Family Medicine

## 2020-02-24 DIAGNOSIS — I509 Heart failure, unspecified: Secondary | ICD-10-CM

## 2020-02-24 DIAGNOSIS — I10 Essential (primary) hypertension: Secondary | ICD-10-CM

## 2020-02-24 MED FILL — FUROSEMIDE 40 MG TAB: 40 | 30 days supply | Qty: 30 | Fill #0

## 2020-02-24 NOTE — Telephone Encounter (Signed)
Requested Prescriptions  Pending Prescriptions Disp Refills   furosemide (LASIX) 40 MG tablet [Pharmacy Med Name: FUROSEMIDE 40 MG TAB 40 Tablet] 30 tablet 0    Sig: TAKE 1 TABLET (40 MG TOTAL) BY MOUTH DAILY.     Cardiovascular:  Diuretics - Loop Failed - 02/24/2020  2:47 PM      Failed - Na in normal range and within 360 days    Sodium  Date Value Ref Range Status  09/26/2019 146 (H) 134 - 144 mmol/L Final         Passed - K in normal range and within 360 days    Potassium  Date Value Ref Range Status  09/26/2019 3.5 3.5 - 5.2 mmol/L Final         Passed - Ca in normal range and within 360 days    Calcium  Date Value Ref Range Status  09/26/2019 9.4 8.7 - 10.2 mg/dL Final         Passed - Cr in normal range and within 360 days    Creatinine, Ser  Date Value Ref Range Status  09/26/2019 1.19 0.76 - 1.27 mg/dL Final         Passed - Last BP in normal range    BP Readings from Last 1 Encounters:  09/26/19 116/77         Passed - Valid encounter within last 6 months    Recent Outpatient Visits          2 months ago Essential hypertension   [place] [name], MD   5 months ago Essential hypertension   [place], MD   10 months ago Feeling of incomplete bladder emptying   [place], MD   1 year ago Chronic congestive heart failure, unspecified heart failure type [facility])   [place] [name], MD   1 year ago Chronic congestive heart failure, unspecified heart failure type [facility])   [place], Patrick E, MD

## 2020-02-28 MED FILL — BIDIL 20-37.5 MG TABS: 20-37.5 | 30 days supply | Qty: 90 | Fill #1

## 2020-03-02 MED FILL — FUROSEMIDE 40 MG TAB: 40 | 30 days supply | Qty: 30 | Fill #0
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
## 2020-03-13 ENCOUNTER — Encounter: Payer: Self-pay | Admitting: Critical Care Medicine

## 2020-03-13 ENCOUNTER — Ambulatory Visit: Payer: Medicare Other | Attending: Critical Care Medicine | Admitting: Critical Care Medicine

## 2020-03-13 ENCOUNTER — Ambulatory Visit: Payer: Medicaid Other | Admitting: Critical Care Medicine

## 2020-03-13 ENCOUNTER — Ambulatory Visit (HOSPITAL_BASED_OUTPATIENT_CLINIC_OR_DEPARTMENT_OTHER): Payer: Medicaid Other | Admitting: Critical Care Medicine

## 2020-03-13 ENCOUNTER — Other Ambulatory Visit: Payer: Self-pay

## 2020-03-13 ENCOUNTER — Other Ambulatory Visit: Payer: Self-pay | Admitting: Critical Care Medicine

## 2020-03-13 VITALS — BP 114/74 | HR 77 | Wt 236.0 lb

## 2020-03-13 DIAGNOSIS — M79674 Pain in right toe(s): Secondary | ICD-10-CM

## 2020-03-13 DIAGNOSIS — E278 Other specified disorders of adrenal gland: Secondary | ICD-10-CM

## 2020-03-13 DIAGNOSIS — R350 Frequency of micturition: Secondary | ICD-10-CM

## 2020-03-13 DIAGNOSIS — I509 Heart failure, unspecified: Secondary | ICD-10-CM | POA: Diagnosis not present

## 2020-03-13 DIAGNOSIS — I272 Pulmonary hypertension, unspecified: Secondary | ICD-10-CM

## 2020-03-13 DIAGNOSIS — E279 Disorder of adrenal gland, unspecified: Secondary | ICD-10-CM

## 2020-03-13 DIAGNOSIS — M5442 Lumbago with sciatica, left side: Secondary | ICD-10-CM

## 2020-03-13 DIAGNOSIS — I25119 Atherosclerotic heart disease of native coronary artery with unspecified angina pectoris: Secondary | ICD-10-CM | POA: Diagnosis not present

## 2020-03-13 DIAGNOSIS — Z0000 Encounter for general adult medical examination without abnormal findings: Secondary | ICD-10-CM

## 2020-03-13 DIAGNOSIS — B351 Tinea unguium: Secondary | ICD-10-CM

## 2020-03-13 DIAGNOSIS — F17210 Nicotine dependence, cigarettes, uncomplicated: Secondary | ICD-10-CM

## 2020-03-13 DIAGNOSIS — E7841 Elevated Lipoprotein(a): Secondary | ICD-10-CM

## 2020-03-13 DIAGNOSIS — F319 Bipolar disorder, unspecified: Secondary | ICD-10-CM

## 2020-03-13 DIAGNOSIS — G8929 Other chronic pain: Secondary | ICD-10-CM

## 2020-03-13 DIAGNOSIS — R200 Anesthesia of skin: Secondary | ICD-10-CM

## 2020-03-13 DIAGNOSIS — N401 Enlarged prostate with lower urinary tract symptoms: Secondary | ICD-10-CM

## 2020-03-13 DIAGNOSIS — R3911 Hesitancy of micturition: Secondary | ICD-10-CM

## 2020-03-13 DIAGNOSIS — M5441 Lumbago with sciatica, right side: Secondary | ICD-10-CM

## 2020-03-13 DIAGNOSIS — I10 Essential (primary) hypertension: Secondary | ICD-10-CM | POA: Diagnosis not present

## 2020-03-13 DIAGNOSIS — Z5900 Homelessness unspecified: Secondary | ICD-10-CM

## 2020-03-13 DIAGNOSIS — N182 Chronic kidney disease, stage 2 (mild): Secondary | ICD-10-CM

## 2020-03-13 DIAGNOSIS — E041 Nontoxic single thyroid nodule: Secondary | ICD-10-CM

## 2020-03-13 DIAGNOSIS — N529 Male erectile dysfunction, unspecified: Secondary | ICD-10-CM

## 2020-03-13 DIAGNOSIS — M792 Neuralgia and neuritis, unspecified: Secondary | ICD-10-CM

## 2020-03-13 DIAGNOSIS — Z1211 Encounter for screening for malignant neoplasm of colon: Secondary | ICD-10-CM

## 2020-03-13 DIAGNOSIS — F4310 Post-traumatic stress disorder, unspecified: Secondary | ICD-10-CM

## 2020-03-13 DIAGNOSIS — J4520 Mild intermittent asthma, uncomplicated: Secondary | ICD-10-CM

## 2020-03-13 MED ORDER — ALBUTEROL SULFATE HFA 108 (90 BASE) MCG/ACT IN AERS: 2.0000 | INHALATION_SPRAY | Freq: Four times a day (QID) | RESPIRATORY_TRACT | 2 refills | Status: DC | PRN

## 2020-03-13 MED ORDER — ATORVASTATIN CALCIUM 80 MG PO TABS
80.0000 mg | ORAL_TABLET | Freq: Every day | ORAL | 4 refills | Status: DC
Start: 2020-03-13 — End: 2020-05-25

## 2020-03-13 MED ORDER — ASPIRIN EC 81 MG PO TBEC: 81.0000 mg | DELAYED_RELEASE_TABLET | Freq: Every day | ORAL | 11 refills | Status: DC

## 2020-03-13 MED ORDER — COLCHICINE 0.6 MG PO TABS: 0.6000 mg | ORAL_TABLET | ORAL | 4 refills | Status: DC

## 2020-03-13 MED ORDER — ISOSORB DINITRATE-HYDRALAZINE 20-37.5 MG PO TABS: 1.0000 | ORAL_TABLET | Freq: Three times a day (TID) | ORAL | 1 refills | Status: DC

## 2020-03-13 MED ORDER — SENNOSIDES-DOCUSATE SODIUM 8.6-50 MG PO TABS: 1.0000 | ORAL_TABLET | Freq: Two times a day (BID) | ORAL | 3 refills | Status: DC

## 2020-03-13 MED ORDER — ALLOPURINOL 100 MG PO TABS
100.0000 mg | ORAL_TABLET | Freq: Every day | ORAL | 3 refills | Status: DC
Start: 2020-03-13 — End: 2020-05-25

## 2020-03-13 MED ORDER — SPIRONOLACTONE 25 MG PO TABS: 12.5000 mg | ORAL_TABLET | Freq: Every day | ORAL | 1 refills | Status: DC

## 2020-03-13 MED ORDER — TRAMADOL HCL 50 MG PO TABS: 50.0000 mg | ORAL_TABLET | Freq: Four times a day (QID) | ORAL | 0 refills | Status: DC | PRN

## 2020-03-13 MED ORDER — CARVEDILOL 6.25 MG PO TABS: 6.2500 mg | ORAL_TABLET | Freq: Two times a day (BID) | ORAL | 1 refills | Status: DC

## 2020-03-13 MED ORDER — GABAPENTIN 300 MG PO CAPS: 300.0000 mg | ORAL_CAPSULE | Freq: Three times a day (TID) | ORAL | 3 refills | Status: DC

## 2020-03-13 MED ORDER — FUROSEMIDE 40 MG PO TABS: 40.0000 mg | ORAL_TABLET | Freq: Every day | ORAL | 0 refills | Status: DC

## 2020-03-13 MED ORDER — QUETIAPINE FUMARATE 100 MG PO TABS: 100.0000 mg | ORAL_TABLET | Freq: Every day | ORAL | 1 refills | Status: DC

## 2020-03-13 MED ORDER — TAMSULOSIN HCL 0.4 MG PO CAPS: 0.4000 mg | ORAL_CAPSULE | Freq: Every day | ORAL | 3 refills | Status: DC

## 2020-03-13 MED FILL — traMADol HCL 50 MG TABS: 50 | 7 days supply | Qty: 30 | Fill #0

## 2020-03-13 MED FILL — SPIRONOLACTONE 25 MG TABLET: 25 | 90 days supply | Qty: 45 | Fill #0

## 2020-03-13 MED FILL — CARVEDILOL 6.25 MG TABLET: 6.25 | 90 days supply | Qty: 180 | Fill #0

## 2020-03-13 MED FILL — ALLOPURINOL 100 MG TABLET: 100 | 90 days supply | Qty: 90 | Fill #0

## 2020-03-13 MED FILL — ATORVASTATIN CALCIUM 80 MG: 80 | 90 days supply | Qty: 90 | Fill #0

## 2020-03-13 MED FILL — QUETIAPINE FUMARATE 100 MG: 100 | 30 days supply | Qty: 30 | Fill #0

## 2020-03-13 MED FILL — ALBUTEROL SULFATE HFA 108 (: 108 (90 BAS | 25 days supply | Qty: 18 | Fill #0

## 2020-03-13 MED FILL — COLCHICINE 0.6 MG TABS: 0.6 | 30 days supply | Qty: 15 | Fill #0

## 2020-03-13 MED FILL — TAMSULOSIN HCL 0.4 MG CAP: 0.4 | 90 days supply | Qty: 90 | Fill #0

## 2020-03-13 NOTE — Progress Notes (Deleted)
Patient ID: Cody Guzman, male   DOB: 10-05-1965, 54 y.o.   MRN: [id number]

## 2020-03-13 NOTE — Progress Notes (Signed)
Pt needs refill on spironolactone,seroquel, nitroglycerin, and tramadol

## 2020-03-13 NOTE — Assessment & Plan Note (Signed)
Noted and will monitor

## 2020-03-13 NOTE — Assessment & Plan Note (Signed)
Continue cardiac medications heart failure is a likely cause of the pulmonary hypertension

## 2020-03-13 NOTE — Patient Instructions (Addendum)
An advance care plan was given for you to review and potentially sign and bring back  Refills on all your medications sent to our pharmacy  Begin Flomax 1 daily to help improve urine flow and a urology consult was ordered  A psychiatry consult was ordered  A foot doctor consult was ordered for your toenail fungus and foot pain  You declined the Covid vaccine and flu vaccine for religious reasons  A Cologuard test was ordered will be mailed to your home  Labs are being obtained today   Mr. Cody Guzman , Thank you for taking time to come for your Medicare Wellness Visit. I appreciate your ongoing commitment to your health goals. Please review the following plan we discussed and let me know if I can assist you in the future.   These are the goals we discussed: Smoking cessation. Obtaining a mental health provider   This is a list of the screening recommended for you and due dates:  Health Maintenance  Topic Date Due  . Colon Cancer Screening  Never done  . Tetanus Vaccine  03/08/2029  .  Hepatitis C: One time screening is recommended by Center for Disease Control  (CDC) for  adults born from [birth year] through 1967.   Completed  . HIV Screening  Completed  . Flu Shot  Discontinued  . COVID-19 Vaccine  Discontinued

## 2020-03-13 NOTE — Assessment & Plan Note (Signed)
Hypertension under good control continue current medication profile to include the BiDil, Aldactone, furosemide and Coreg

## 2020-03-13 NOTE — Assessment & Plan Note (Signed)
Refill lipid therapy

## 2020-03-13 NOTE — Assessment & Plan Note (Signed)
Will monitor

## 2020-03-13 NOTE — Assessment & Plan Note (Signed)
  .   Current smoking consumption amount: 3 cigarettes daily  . Dicsussion on advise to quit smoking and smoking impacts: Cardiovascular lung health  . Patient's willingness to quit: Willing to quit  . Methods to quit smoking discussed: Behavioral modification  . Medication management of smoking session drugs discussed: Nicotine replacement  . Resources provided:  AVS   . Setting quit date not yet set  Follow-up arranged 2 months  Time spent counseling the patient: 5 minutes

## 2020-03-13 NOTE — Assessment & Plan Note (Addendum)
History of coronary artery disease with history of heart failure and angina stable at this time  Plan to refill all the patient's current medications and refer back to cardiology for evaluations to get reconnected with care Refill the atorvastatin and recheck lipid panel

## 2020-03-13 NOTE — Assessment & Plan Note (Signed)
Patient now has housing 

## 2020-03-13 NOTE — Assessment & Plan Note (Signed)
Recheck metabolic panel.   
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
## 2020-03-13 NOTE — Assessment & Plan Note (Signed)
Refer back to psychiatry to establish care

## 2020-03-13 NOTE — Assessment & Plan Note (Signed)
Referral to psychiatry

## 2020-03-20 MED FILL — traMADol HCL 50 MG TABS: 50 | 7 days supply | Qty: 30 | Fill #0

## 2020-03-21 ENCOUNTER — Ambulatory Visit (INDEPENDENT_AMBULATORY_CARE_PROVIDER_SITE_OTHER): Payer: Medicare Other | Admitting: Internal Medicine

## 2020-03-21 ENCOUNTER — Encounter: Payer: Self-pay | Admitting: Internal Medicine

## 2020-03-21 VITALS — BP 124/82 | HR 80 | Ht 68.0 in | Wt 238.8 lb

## 2020-03-21 DIAGNOSIS — I25119 Atherosclerotic heart disease of native coronary artery with unspecified angina pectoris: Secondary | ICD-10-CM

## 2020-03-21 DIAGNOSIS — I272 Pulmonary hypertension, unspecified: Secondary | ICD-10-CM

## 2020-03-21 DIAGNOSIS — I10 Essential (primary) hypertension: Secondary | ICD-10-CM

## 2020-03-21 DIAGNOSIS — I5042 Chronic combined systolic (congestive) and diastolic (congestive) heart failure: Secondary | ICD-10-CM | POA: Diagnosis not present

## 2020-03-21 DIAGNOSIS — I255 Ischemic cardiomyopathy: Secondary | ICD-10-CM

## 2020-03-21 DIAGNOSIS — I509 Heart failure, unspecified: Secondary | ICD-10-CM

## 2020-03-21 DIAGNOSIS — I340 Nonrheumatic mitral (valve) insufficiency: Secondary | ICD-10-CM | POA: Diagnosis not present

## 2020-03-21 DIAGNOSIS — E785 Hyperlipidemia, unspecified: Secondary | ICD-10-CM

## 2020-03-21 MED ORDER — TORSEMIDE 20 MG PO TABS: 20.0000 mg | ORAL_TABLET | Freq: Every day | ORAL | 3 refills | Status: DC

## 2020-03-21 MED FILL — TORSEMIDE 20 MG TABLET: 20 | 30 days supply | Qty: 30 | Fill #0

## 2020-03-21 NOTE — Patient Instructions (Signed)
Medication Instructions:  STOP: LASIX  START: TORSEMIDE 20mg  DAILY   *If you need a refill on your cardiac medications before your next appointment, please call your pharmacy*  Testing/Procedures: Your physician has requested that you have an echocardiogram. Echocardiography is a painless test that uses sound waves to create images of your heart. It provides your doctor with information about the size and shape of your heart and how well your heart's chambers and valves are working. You may receive an ultrasound enhancing agent through an IV if needed to better visualize your heart during the echo.This procedure takes approximately one hour. There are no restrictions for this procedure. This will take place at the [address].   Follow-Up: At [facility], you and your health needs are our priority.  As part of our continuing mission to provide you with exceptional heart care, we have created designated Provider Care Teams.  These Care Teams include your primary Cardiologist (physician) and Advanced Practice Providers (APPs -  Physician Assistants and Nurse Practitioners) who all work together to provide you with the care you need, when you need it.  We recommend signing up for the patient portal called "MyChart".  Sign up information is provided on this After Visit Summary.  MyChart is used to connect with patients for Virtual Visits (Telemedicine).  Patients are able to view lab/test results, encounter notes, upcoming appointments, etc.  Non-urgent messages can be sent to your provider as well.   To learn more about what you can do with MyChart, go to [email or web address].    Your next appointment:   December 9th at 10:45am   The format for your next appointment:   In Person  Provider:   You will see one of the following Advanced Practice Providers on your designated Care Team:    Almyra Deforest, [place]
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
## 2020-03-26 ENCOUNTER — Telehealth: Payer: Self-pay | Admitting: Licensed Clinical Social Worker

## 2020-03-26 NOTE — Telephone Encounter (Signed)
CSW received request from provider to reach out to pt for possible assistance. CSW called pt on preferred # at [phone]. Pt did not answer- sent text to this writers [place] work phone requesting that we text him. HIPAA compliant text stating role and office and request to call back sent to pt.   Will f/u as able.   Westley Hummer, MSW, [place] [employer]

## 2020-04-05 ENCOUNTER — Other Ambulatory Visit: Payer: Self-pay

## 2020-04-05 ENCOUNTER — Telehealth: Payer: Self-pay | Admitting: Licensed Clinical Social Worker

## 2020-04-05 NOTE — Telephone Encounter (Signed)
Attempted again to reach pt to see how we could assist them. No answer at [phone], unable to leave voicemail as inbox is full at this time.   Westley Hummer, MSW, [place] [employer]

## 2020-04-06 ENCOUNTER — Ambulatory Visit (INDEPENDENT_AMBULATORY_CARE_PROVIDER_SITE_OTHER): Payer: Medicare Other | Admitting: Podiatry

## 2020-04-06 DIAGNOSIS — Z5329 Procedure and treatment not carried out because of patient's decision for other reasons: Secondary | ICD-10-CM

## 2020-04-06 MED FILL — AMOXICILLIN 500 MG CAPSULE: 500 | 3 days supply | Qty: 9 | Fill #0

## 2020-04-06 NOTE — Progress Notes (Signed)
No show for appt. 

## 2020-04-11 ENCOUNTER — Ambulatory Visit (HOSPITAL_COMMUNITY): Admission: RE | Admit: 2020-04-11 | Payer: Medicare Other | Source: Ambulatory Visit

## 2020-04-12 ENCOUNTER — Encounter: Payer: Medicare Other | Admitting: Physician Assistant

## 2020-04-13 NOTE — Progress Notes (Signed)
This encounter was created in error - please disregard.

## 2020-04-23 ENCOUNTER — Ambulatory Visit (HOSPITAL_COMMUNITY): Admission: RE | Admit: 2020-04-23 | Payer: Medicare Other | Source: Ambulatory Visit

## 2020-04-23 ENCOUNTER — Encounter (HOSPITAL_COMMUNITY): Payer: Self-pay | Admitting: Internal Medicine

## 2020-05-01 MED FILL — TORSEMIDE 20 MG TABLET: 20 | 30 days supply | Qty: 30 | Fill #1

## 2020-05-01 MED FILL — AMOXICILLIN 500 MG CAPSULE: 500 | 3 days supply | Qty: 9 | Fill #0

## 2020-05-01 MED FILL — BIDIL 20-37.5 MG TABS: 20-37.5 | 30 days supply | Qty: 90 | Fill #2

## 2020-05-01 MED FILL — COLCHICINE 0.6 MG TABS: 0.6 | 30 days supply | Qty: 15 | Fill #1

## 2020-05-07 ENCOUNTER — Telehealth (HOSPITAL_COMMUNITY): Payer: Self-pay | Admitting: Internal Medicine

## 2020-05-07 NOTE — Telephone Encounter (Signed)
Just an FYI. We have made several attempts to contact this patient including sending a letter to schedule or reschedule their echocardiogram. We will be removing the patient from the echo WQ.   04/23/20 PT NO SHOWED-MAILED LETTER/LBW     Thank you

## 2020-05-09 MED FILL — QUETIAPINE FUMARATE 100 MG: 100 | 30 days supply | Qty: 30 | Fill #1

## 2020-05-09 MED FILL — COLCHICINE 0.6 MG TABS: 0.6 | 30 days supply | Qty: 15 | Fill #1

## 2020-05-09 MED FILL — AMOXICILLIN 500 MG CAPSULE: 500 | 3 days supply | Qty: 9 | Fill #0

## 2020-05-09 MED FILL — GABAPENTIN 300 MG CAPSULE: 300 | 30 days supply | Qty: 90 | Fill #0

## 2020-05-09 MED FILL — BIDIL 20-37.5 MG TABS: 20-37.5 | 30 days supply | Qty: 90 | Fill #0

## 2020-05-09 MED FILL — TORSEMIDE 20 MG TABLET: 20 | 30 days supply | Qty: 30 | Fill #1

## 2020-05-24 ENCOUNTER — Telehealth: Payer: Self-pay | Admitting: Critical Care Medicine

## 2020-05-24 DIAGNOSIS — M5442 Lumbago with sciatica, left side: Secondary | ICD-10-CM

## 2020-05-24 DIAGNOSIS — F319 Bipolar disorder, unspecified: Secondary | ICD-10-CM

## 2020-05-24 DIAGNOSIS — J4520 Mild intermittent asthma, uncomplicated: Secondary | ICD-10-CM

## 2020-05-24 DIAGNOSIS — M792 Neuralgia and neuritis, unspecified: Secondary | ICD-10-CM

## 2020-05-24 DIAGNOSIS — I509 Heart failure, unspecified: Secondary | ICD-10-CM

## 2020-05-24 DIAGNOSIS — I10 Essential (primary) hypertension: Secondary | ICD-10-CM

## 2020-05-24 DIAGNOSIS — R200 Anesthesia of skin: Secondary | ICD-10-CM

## 2020-05-24 NOTE — Telephone Encounter (Signed)
Medication: albuterol (VENTOLIN HFA) 108 (90 Base) MCG/ACT inhaler [[id number]] , allopurinol (ZYLOPRIM) 100 MG tablet [[id number]] , aspirin EC 81 MG tablet [[id number]] , atorvastatin (LIPITOR) 80 MG tablet [[id number]] , carvedilol (COREG) 6.25 MG tablet [[id number]] , colchicine 0.6 MG tablet [[id number]] , gabapentin (NEURONTIN) 300 MG capsule [[id number]] , isosorbide-hydrALAZINE (BIDIL) 20-37.5 MG tablet [[id number]] , QUEtiapine (SEROQUEL) 100 MG tablet [[id number]] , senna-docusate (SENOKOT-S) 8.6-50 MG tablet [[id number]] , spironolactone (ALDACTONE) 25 MG tablet [[id number]] , tamsulosin (FLOMAX) 0.4 MG CAPS capsule [[id number]],torsemide (DEMADEX) 20 MG tablet [[id number]] , traMADol (ULTRAM) 50 MG tablet [[id number]]   Has the patient contacted their pharmacy? YES  (Agent: If no, request that the patient contact the pharmacy for the refill.) (Agent: If yes, when and what did the pharmacy advise?)  Preferred Pharmacy (with phone number or street name): [facility] [address] [place], [place]  Agent: Please be advised that RX refills may take up to 3 business days. We ask that you follow-up with your pharmacy.

## 2020-05-25 MED ORDER — QUETIAPINE FUMARATE 100 MG PO TABS: 100.0000 mg | ORAL_TABLET | Freq: Every day | ORAL | 2 refills | Status: DC

## 2020-05-25 MED ORDER — CARVEDILOL 6.25 MG PO TABS: 6.2500 mg | ORAL_TABLET | Freq: Two times a day (BID) | ORAL | 2 refills | Status: DC

## 2020-05-25 MED ORDER — SPIRONOLACTONE 25 MG PO TABS: 12.5000 mg | ORAL_TABLET | Freq: Every day | ORAL | 2 refills | Status: DC

## 2020-05-25 MED ORDER — ATORVASTATIN CALCIUM 80 MG PO TABS
80.0000 mg | ORAL_TABLET | Freq: Every day | ORAL | 2 refills | Status: DC
Start: 2020-05-25 — End: 2020-05-25

## 2020-05-25 MED ORDER — COLCHICINE 0.6 MG PO TABS: 0.6000 mg | ORAL_TABLET | ORAL | 2 refills | Status: DC

## 2020-05-25 MED ORDER — ATORVASTATIN CALCIUM 80 MG PO TABS: 80.0000 mg | ORAL_TABLET | Freq: Every day | ORAL | 2 refills | Status: DC

## 2020-05-25 MED ORDER — TORSEMIDE 20 MG PO TABS: 20.0000 mg | ORAL_TABLET | Freq: Every day | ORAL | 2 refills | Status: DC

## 2020-05-25 MED ORDER — ALLOPURINOL 100 MG PO TABS: 100.0000 mg | ORAL_TABLET | Freq: Every day | ORAL | 2 refills | Status: DC

## 2020-05-25 MED ORDER — ALBUTEROL SULFATE HFA 108 (90 BASE) MCG/ACT IN AERS: 2.0000 | INHALATION_SPRAY | Freq: Four times a day (QID) | RESPIRATORY_TRACT | 2 refills | Status: DC | PRN

## 2020-05-25 MED ORDER — TAMSULOSIN HCL 0.4 MG PO CAPS: 0.4000 mg | ORAL_CAPSULE | Freq: Every day | ORAL | 2 refills | Status: DC

## 2020-05-25 MED ORDER — ALLOPURINOL 100 MG PO TABS
100.0000 mg | ORAL_TABLET | Freq: Every day | ORAL | 2 refills | Status: DC
Start: 2020-05-25 — End: 2020-05-25

## 2020-05-25 MED ORDER — ISOSORB DINITRATE-HYDRALAZINE 20-37.5 MG PO TABS: 1.0000 | ORAL_TABLET | Freq: Three times a day (TID) | ORAL | 2 refills | Status: DC

## 2020-05-25 NOTE — Telephone Encounter (Signed)
Rxs sent with the exception of gabapentin and tramadol. I cannot approve these.   Dr. [name],  I have sent the other rxs. Are you able to send gabapentin and tramadol?

## 2020-05-27 MED ORDER — GABAPENTIN 300 MG PO CAPS: 300.0000 mg | ORAL_CAPSULE | Freq: Three times a day (TID) | ORAL | 3 refills | Status: DC

## 2020-05-27 NOTE — Telephone Encounter (Signed)
Rx Gabapentin, but did not rx tramadol

## 2020-05-27 NOTE — Addendum Note (Signed)
Addended by: [name] on: 05/27/2020 05:56 PM   Modules accepted: Orders

## 2020-05-28 NOTE — Telephone Encounter (Signed)
[facility] called about not receiving Rx for Tramadol / please advise

## 2020-05-29 NOTE — Telephone Encounter (Signed)
Patient aware the medication- Tramadol was not refilled.

## 2020-06-13 ENCOUNTER — Other Ambulatory Visit: Payer: Self-pay | Admitting: Critical Care Medicine

## 2020-06-13 MED ORDER — COLCHICINE 0.6 MG PO CAPS
1.0000 | ORAL_CAPSULE | Freq: Every day | ORAL | 1 refills | Status: DC | PRN
Start: 2020-06-13 — End: 2020-08-27

## 2020-06-13 MED FILL — COLCHICINE 0.6 MG CAPSULE: 0.6 | 30 days supply | Qty: 30 | Fill #0

## 2020-06-14 ENCOUNTER — Telehealth: Payer: Self-pay

## 2020-06-14 ENCOUNTER — Encounter: Payer: Self-pay | Admitting: Critical Care Medicine

## 2020-06-14 ENCOUNTER — Ambulatory Visit: Payer: Medicare Other | Attending: Critical Care Medicine | Admitting: Critical Care Medicine

## 2020-06-14 ENCOUNTER — Other Ambulatory Visit: Payer: Self-pay

## 2020-06-14 VITALS — BP 105/74 | HR 99 | Resp 16 | Wt 236.0 lb

## 2020-06-14 DIAGNOSIS — R200 Anesthesia of skin: Secondary | ICD-10-CM

## 2020-06-14 DIAGNOSIS — G4733 Obstructive sleep apnea (adult) (pediatric): Secondary | ICD-10-CM | POA: Insufficient documentation

## 2020-06-14 DIAGNOSIS — M792 Neuralgia and neuritis, unspecified: Secondary | ICD-10-CM

## 2020-06-14 DIAGNOSIS — N529 Male erectile dysfunction, unspecified: Secondary | ICD-10-CM

## 2020-06-14 DIAGNOSIS — M25551 Pain in right hip: Secondary | ICD-10-CM

## 2020-06-14 DIAGNOSIS — F319 Bipolar disorder, unspecified: Secondary | ICD-10-CM

## 2020-06-14 DIAGNOSIS — M545 Low back pain, unspecified: Secondary | ICD-10-CM

## 2020-06-14 DIAGNOSIS — I255 Ischemic cardiomyopathy: Secondary | ICD-10-CM

## 2020-06-14 DIAGNOSIS — I10 Essential (primary) hypertension: Secondary | ICD-10-CM | POA: Diagnosis not present

## 2020-06-14 DIAGNOSIS — I509 Heart failure, unspecified: Secondary | ICD-10-CM | POA: Diagnosis not present

## 2020-06-14 DIAGNOSIS — Z1211 Encounter for screening for malignant neoplasm of colon: Secondary | ICD-10-CM

## 2020-06-14 DIAGNOSIS — M109 Gout, unspecified: Secondary | ICD-10-CM | POA: Insufficient documentation

## 2020-06-14 DIAGNOSIS — I25119 Atherosclerotic heart disease of native coronary artery with unspecified angina pectoris: Secondary | ICD-10-CM

## 2020-06-14 DIAGNOSIS — M1990 Unspecified osteoarthritis, unspecified site: Secondary | ICD-10-CM | POA: Insufficient documentation

## 2020-06-14 MED ORDER — KETOROLAC TROMETHAMINE 60 MG/2ML IM SOLN
60.0000 mg | Freq: Once | INTRAMUSCULAR | Status: AC
  Administered 2020-06-14: 60 mg via INTRAMUSCULAR

## 2020-06-14 MED ORDER — ATORVASTATIN CALCIUM 80 MG PO TABS: 80.0000 mg | ORAL_TABLET | Freq: Every day | ORAL | 2 refills | Status: DC

## 2020-06-14 MED ORDER — CARVEDILOL 6.25 MG PO TABS: 6.2500 mg | ORAL_TABLET | Freq: Two times a day (BID) | ORAL | 2 refills | Status: DC

## 2020-06-14 MED ORDER — TADALAFIL 10 MG PO TABS: 10.0000 mg | ORAL_TABLET | Freq: Every day | ORAL | 0 refills | Status: DC | PRN

## 2020-06-14 MED ORDER — GABAPENTIN 300 MG PO CAPS: 300.0000 mg | ORAL_CAPSULE | Freq: Three times a day (TID) | ORAL | 3 refills | Status: DC

## 2020-06-14 MED ORDER — ISOSORB DINITRATE-HYDRALAZINE 20-37.5 MG PO TABS: 1.0000 | ORAL_TABLET | Freq: Three times a day (TID) | ORAL | 2 refills | Status: DC

## 2020-06-14 MED ORDER — TORSEMIDE 20 MG PO TABS
20.0000 mg | ORAL_TABLET | Freq: Every day | ORAL | 2 refills | Status: DC
Start: 2020-06-14 — End: 2020-06-21

## 2020-06-14 MED ORDER — TAMSULOSIN HCL 0.4 MG PO CAPS
0.4000 mg | ORAL_CAPSULE | Freq: Every day | ORAL | 2 refills | Status: DC
Start: 2020-06-14 — End: 2020-08-27

## 2020-06-14 MED ORDER — ALLOPURINOL 100 MG PO TABS: 100.0000 mg | ORAL_TABLET | Freq: Every day | ORAL | 2 refills | Status: DC

## 2020-06-14 MED ORDER — SPIRONOLACTONE 25 MG PO TABS: 12.5000 mg | ORAL_TABLET | Freq: Every day | ORAL | 2 refills | Status: DC

## 2020-06-14 MED FILL — CARVEDILOL 6.25 MG TABLET: 6.25 | 30 days supply | Qty: 60 | Fill #0

## 2020-06-14 MED FILL — ALLOPURINOL 100 MG TABLET: 100 | 30 days supply | Qty: 30 | Fill #0

## 2020-06-14 MED FILL — TORSEMIDE 20 MG TABLET: 20 | 30 days supply | Qty: 30 | Fill #0

## 2020-06-14 MED FILL — TAMSULOSIN HCL 0.4 MG CAP: 0.4 | 30 days supply | Qty: 30 | Fill #0

## 2020-06-14 MED FILL — GABAPENTIN 300 MG CAPSULE: 300 | 30 days supply | Qty: 90 | Fill #0

## 2020-06-14 MED FILL — SPIRONOLACTONE 25 MG TABLET: 25 | 30 days supply | Qty: 15 | Fill #0

## 2020-06-14 MED FILL — ATORVASTATIN CALCIUM 80 MG: 80 | 30 days supply | Qty: 30 | Fill #0
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
## 2020-06-14 NOTE — Assessment & Plan Note (Signed)
Start trial of Cialis

## 2020-06-14 NOTE — Assessment & Plan Note (Signed)
All of the heart failure medications were renewed at this visit

## 2020-06-14 NOTE — Telephone Encounter (Signed)
Order for semi-electric hospital bed faxed to [facility]

## 2020-06-14 NOTE — Progress Notes (Signed)
Concerns with sleeping  - holding breath when he is asleep -needs a RF for sleep number, adjustable.   C/o pain in right hip- 10/10, constant throughout the day. Takes Tramadol. Doesn't help with pain, just feel drunk.   States seroquel does not work

## 2020-06-14 NOTE — Assessment & Plan Note (Signed)
Hypertension at goal with current therapy

## 2020-06-14 NOTE — Patient Instructions (Signed)
Referral to sleep medicine physician will be made  Referral to psychiatry was made for your bipolar disorder  Echocardiogram will be ordered  X-rays of hip and lower back were ordered  Toradol injection given for your back pain  [facility]  Refills on all your medicines sent to your mail order pharmacy  Discontinue tramadol and Seroquel  Cialis was written for erectile dysfunction  Return to Dr. [name] 2 months

## 2020-06-14 NOTE — Assessment & Plan Note (Addendum)
Lumbar series of x-rays  Referral DME for proper bed that can raise and lower to help with positioning comfort as the current mattress is ineffective at home

## 2020-06-14 NOTE — Assessment & Plan Note (Signed)
Symptom complex compatible with sleep apnea referral to pulmonary sleep medicine made
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
## 2020-06-14 NOTE — Assessment & Plan Note (Signed)
Ischemic cardiomyopathy with episodes of chest pain  We will obtain echocardiogram and have patient follow-up with cardiology

## 2020-06-14 NOTE — Assessment & Plan Note (Addendum)
Obtain right hip film Give 60 mg dose of Toradol injection at this visit Discontinue tramadol

## 2020-06-14 NOTE — Addendum Note (Signed)
Addended by: [name] on: 06/14/2020 01:58 PM   Modules accepted: Orders

## 2020-06-14 NOTE — Assessment & Plan Note (Signed)
Patient with some exertional chest pain I suspect is anginal in nature

## 2020-06-14 NOTE — Assessment & Plan Note (Signed)
Previous psychiatric practice made referral that was declined by that practice  We will place another referral for psychiatry care given he has severe bipolar disorder was on lithium in the past

## 2020-06-15 ENCOUNTER — Telehealth: Payer: Self-pay | Admitting: Critical Care Medicine

## 2020-06-15 NOTE — Telephone Encounter (Signed)
Copied from [place] [id number]. Topic: General - Other >> Jun 15, 2020  8:29 AM Leward Quan A wrote: Reason for CRM: Anderson Malta with [facility] called in to request most recent office notes that stsates patient need for the hospital bed and the patients height and weight. Please advise  Ph# [phone] Fax# [phone]

## 2020-06-16 ENCOUNTER — Other Ambulatory Visit: Payer: Self-pay

## 2020-06-16 ENCOUNTER — Emergency Department (HOSPITAL_COMMUNITY): Payer: Medicare Other

## 2020-06-16 ENCOUNTER — Encounter (HOSPITAL_COMMUNITY): Payer: Self-pay | Admitting: Emergency Medicine

## 2020-06-16 ENCOUNTER — Inpatient Hospital Stay (HOSPITAL_COMMUNITY)
Admission: EM | Admit: 2020-06-16 | Discharge: 2020-06-21 | DRG: 308 | Disposition: A | Discharge status: Home / Self Care | Payer: Medicare Other | Attending: Family Medicine | Admitting: Family Medicine

## 2020-06-16 DIAGNOSIS — N179 Acute kidney failure, unspecified: Secondary | ICD-10-CM | POA: Diagnosis present

## 2020-06-16 DIAGNOSIS — F319 Bipolar disorder, unspecified: Secondary | ICD-10-CM | POA: Diagnosis not present

## 2020-06-16 DIAGNOSIS — K2100 Gastro-esophageal reflux disease with esophagitis, without bleeding: Secondary | ICD-10-CM | POA: Diagnosis present

## 2020-06-16 DIAGNOSIS — Z6834 Body mass index (BMI) 34.0-34.9, adult: Secondary | ICD-10-CM | POA: Diagnosis not present

## 2020-06-16 DIAGNOSIS — M109 Gout, unspecified: Secondary | ICD-10-CM | POA: Diagnosis present

## 2020-06-16 DIAGNOSIS — R300 Dysuria: Secondary | ICD-10-CM | POA: Diagnosis present

## 2020-06-16 DIAGNOSIS — G4733 Obstructive sleep apnea (adult) (pediatric): Secondary | ICD-10-CM | POA: Diagnosis present

## 2020-06-16 DIAGNOSIS — Z841 Family history of disorders of kidney and ureter: Secondary | ICD-10-CM

## 2020-06-16 DIAGNOSIS — I861 Scrotal varices: Secondary | ICD-10-CM | POA: Diagnosis present

## 2020-06-16 DIAGNOSIS — I25119 Atherosclerotic heart disease of native coronary artery with unspecified angina pectoris: Secondary | ICD-10-CM | POA: Diagnosis present

## 2020-06-16 DIAGNOSIS — Z955 Presence of coronary angioplasty implant and graft: Secondary | ICD-10-CM

## 2020-06-16 DIAGNOSIS — I4892 Unspecified atrial flutter: Secondary | ICD-10-CM | POA: Diagnosis not present

## 2020-06-16 DIAGNOSIS — E876 Hypokalemia: Secondary | ICD-10-CM | POA: Diagnosis present

## 2020-06-16 DIAGNOSIS — R001 Bradycardia, unspecified: Secondary | ICD-10-CM | POA: Diagnosis present

## 2020-06-16 DIAGNOSIS — K5900 Constipation, unspecified: Secondary | ICD-10-CM | POA: Diagnosis not present

## 2020-06-16 DIAGNOSIS — I5082 Biventricular heart failure: Secondary | ICD-10-CM | POA: Diagnosis present

## 2020-06-16 DIAGNOSIS — I5021 Acute systolic (congestive) heart failure: Secondary | ICD-10-CM | POA: Diagnosis not present

## 2020-06-16 DIAGNOSIS — Z8572 Personal history of non-Hodgkin lymphomas: Secondary | ICD-10-CM

## 2020-06-16 DIAGNOSIS — Z9221 Personal history of antineoplastic chemotherapy: Secondary | ICD-10-CM

## 2020-06-16 DIAGNOSIS — I959 Hypotension, unspecified: Secondary | ICD-10-CM

## 2020-06-16 DIAGNOSIS — G43909 Migraine, unspecified, not intractable, without status migrainosus: Secondary | ICD-10-CM | POA: Diagnosis present

## 2020-06-16 DIAGNOSIS — Z7982 Long term (current) use of aspirin: Secondary | ICD-10-CM

## 2020-06-16 DIAGNOSIS — N183 Chronic kidney disease, stage 3 unspecified: Secondary | ICD-10-CM | POA: Diagnosis not present

## 2020-06-16 DIAGNOSIS — M5441 Lumbago with sciatica, right side: Secondary | ICD-10-CM | POA: Diagnosis present

## 2020-06-16 DIAGNOSIS — R000 Tachycardia, unspecified: Secondary | ICD-10-CM | POA: Diagnosis not present

## 2020-06-16 DIAGNOSIS — E669 Obesity, unspecified: Secondary | ICD-10-CM | POA: Diagnosis present

## 2020-06-16 DIAGNOSIS — I4891 Unspecified atrial fibrillation: Secondary | ICD-10-CM | POA: Diagnosis not present

## 2020-06-16 DIAGNOSIS — I255 Ischemic cardiomyopathy: Secondary | ICD-10-CM | POA: Diagnosis present

## 2020-06-16 DIAGNOSIS — G9389 Other specified disorders of brain: Secondary | ICD-10-CM | POA: Diagnosis present

## 2020-06-16 DIAGNOSIS — Z20822 Contact with and (suspected) exposure to covid-19: Secondary | ICD-10-CM | POA: Diagnosis present

## 2020-06-16 DIAGNOSIS — I340 Nonrheumatic mitral (valve) insufficiency: Secondary | ICD-10-CM | POA: Diagnosis present

## 2020-06-16 DIAGNOSIS — E785 Hyperlipidemia, unspecified: Secondary | ICD-10-CM | POA: Diagnosis present

## 2020-06-16 DIAGNOSIS — I252 Old myocardial infarction: Secondary | ICD-10-CM

## 2020-06-16 DIAGNOSIS — Z8249 Family history of ischemic heart disease and other diseases of the circulatory system: Secondary | ICD-10-CM

## 2020-06-16 DIAGNOSIS — I5023 Acute on chronic systolic (congestive) heart failure: Secondary | ICD-10-CM | POA: Diagnosis not present

## 2020-06-16 DIAGNOSIS — N289 Disorder of kidney and ureter, unspecified: Secondary | ICD-10-CM | POA: Diagnosis present

## 2020-06-16 DIAGNOSIS — G8929 Other chronic pain: Secondary | ICD-10-CM | POA: Diagnosis present

## 2020-06-16 DIAGNOSIS — F17210 Nicotine dependence, cigarettes, uncomplicated: Secondary | ICD-10-CM | POA: Diagnosis present

## 2020-06-16 DIAGNOSIS — I130 Hypertensive heart and chronic kidney disease with heart failure and stage 1 through stage 4 chronic kidney disease, or unspecified chronic kidney disease: Secondary | ICD-10-CM | POA: Diagnosis present

## 2020-06-16 DIAGNOSIS — I5020 Unspecified systolic (congestive) heart failure: Secondary | ICD-10-CM | POA: Diagnosis not present

## 2020-06-16 DIAGNOSIS — Z79899 Other long term (current) drug therapy: Secondary | ICD-10-CM

## 2020-06-16 DIAGNOSIS — R531 Weakness: Secondary | ICD-10-CM | POA: Diagnosis present

## 2020-06-16 DIAGNOSIS — N1831 Chronic kidney disease, stage 3a: Secondary | ICD-10-CM | POA: Diagnosis present

## 2020-06-16 DIAGNOSIS — I2510 Atherosclerotic heart disease of native coronary artery without angina pectoris: Secondary | ICD-10-CM | POA: Diagnosis not present

## 2020-06-16 DIAGNOSIS — R1084 Generalized abdominal pain: Secondary | ICD-10-CM

## 2020-06-16 DIAGNOSIS — N182 Chronic kidney disease, stage 2 (mild): Secondary | ICD-10-CM | POA: Diagnosis present

## 2020-06-16 DIAGNOSIS — C8590 Non-Hodgkin lymphoma, unspecified, unspecified site: Secondary | ICD-10-CM | POA: Diagnosis present

## 2020-06-16 LAB — CBC
HCT: 39.8 % (ref 39.0–52.0)
Hemoglobin: 13.4 g/dL (ref 13.0–17.0)
MCH: 29.6 pg (ref 26.0–34.0)
MCHC: 33.7 g/dL (ref 30.0–36.0)
MCV: 88.1 fL (ref 80.0–100.0)
Platelets: 228 10*3/uL (ref 150–400)
RBC: 4.52 MIL/uL (ref 4.22–5.81)
RDW: 15.7 % — ABNORMAL HIGH (ref 11.5–15.5)
WBC: 7.2 10*3/uL (ref 4.0–10.5)
nRBC: 0 % (ref 0.0–0.2)

## 2020-06-16 LAB — RESP PANEL BY RT-PCR (FLU A&B, COVID) ARPGX2
Influenza A by PCR: NEGATIVE
Influenza B by PCR: NEGATIVE
SARS Coronavirus 2 by RT PCR: NEGATIVE

## 2020-06-16 LAB — COMPREHENSIVE METABOLIC PANEL
ALT: 17 U/L (ref 0–44)
AST: 25 U/L (ref 15–41)
Albumin: 3.3 g/dL — ABNORMAL LOW (ref 3.5–5.0)
Alkaline Phosphatase: 70 U/L (ref 38–126)
Anion gap: 12 (ref 5–15)
BUN: 18 mg/dL (ref 6–20)
CO2: 20 mmol/L — ABNORMAL LOW (ref 22–32)
Calcium: 8 mg/dL — ABNORMAL LOW (ref 8.9–10.3)
Chloride: 110 mmol/L (ref 98–111)
Creatinine, Ser: 1.44 mg/dL — ABNORMAL HIGH (ref 0.61–1.24)
GFR, Estimated: 57 mL/min — ABNORMAL LOW (ref >60)
Glucose, Bld: 87 mg/dL (ref 70–99)
Potassium: 3 mmol/L — ABNORMAL LOW (ref 3.5–5.1)
Sodium: 142 mmol/L (ref 135–145)
Total Bilirubin: 1.1 mg/dL (ref 0.3–1.2)
Total Protein: 5.9 g/dL — ABNORMAL LOW (ref 6.5–8.1)

## 2020-06-16 LAB — CBC WITH DIFFERENTIAL/PLATELET
Abs Immature Granulocytes: 0.01 10*3/uL (ref 0.00–0.07)
Basophils Absolute: 0.1 10*3/uL (ref 0.0–0.1)
Basophils Relative: 1 %
Eosinophils Absolute: 0.2 10*3/uL (ref 0.0–0.5)
Eosinophils Relative: 3 %
HCT: 37.3 % — ABNORMAL LOW (ref 39.0–52.0)
Hemoglobin: 11.6 g/dL — ABNORMAL LOW (ref 13.0–17.0)
Immature Granulocytes: 0 %
Lymphocytes Relative: 26 %
Lymphs Abs: 2 10*3/uL (ref 0.7–4.0)
MCH: 29.4 pg (ref 26.0–34.0)
MCHC: 31.1 g/dL (ref 30.0–36.0)
MCV: 94.7 fL (ref 80.0–100.0)
Monocytes Absolute: 0.6 10*3/uL (ref 0.1–1.0)
Monocytes Relative: 8 %
Neutro Abs: 4.6 10*3/uL (ref 1.7–7.7)
Neutrophils Relative %: 62 %
Platelets: 204 10*3/uL (ref 150–400)
RBC: 3.94 MIL/uL — ABNORMAL LOW (ref 4.22–5.81)
RDW: 15.7 % — ABNORMAL HIGH (ref 11.5–15.5)
WBC: 7.4 10*3/uL (ref 4.0–10.5)
nRBC: 0 % (ref 0.0–0.2)

## 2020-06-16 LAB — LIPASE, BLOOD: Lipase: 23 U/L (ref 11–51)

## 2020-06-16 LAB — TROPONIN I (HIGH SENSITIVITY)
Troponin I (High Sensitivity): 29 ng/L — ABNORMAL HIGH (ref <18)
Troponin I (High Sensitivity): 17 ng/L (ref <18)

## 2020-06-16 LAB — BRAIN NATRIURETIC PEPTIDE: B Natriuretic Peptide: 361.5 pg/mL — ABNORMAL HIGH (ref 0.0–100.0)

## 2020-06-16 LAB — MAGNESIUM: Magnesium: 1.3 mg/dL — ABNORMAL LOW (ref 1.7–2.4)

## 2020-06-16 MED ORDER — POTASSIUM CHLORIDE 10 MEQ/100ML IV SOLN
10.0000 meq | INTRAVENOUS | Status: AC
  Administered 2020-06-16 (×3): 10 meq via INTRAVENOUS
  Filled 2020-06-16 (×3): qty 100

## 2020-06-16 MED ORDER — ACETAMINOPHEN 325 MG PO TABS
650.0000 mg | ORAL_TABLET | ORAL | Status: DC | PRN
  Filled 2020-06-16: qty 2

## 2020-06-16 MED ORDER — HEPARIN SODIUM (PORCINE) 5000 UNIT/ML IJ SOLN
5000.0000 [IU] | Freq: Three times a day (TID) | INTRAMUSCULAR | Status: DC
  Administered 2020-06-17 – 2020-06-18 (×4): 5000 [IU] via SUBCUTANEOUS
  Filled 2020-06-16 (×3): qty 1

## 2020-06-16 MED ORDER — ONDANSETRON HCL 4 MG/2ML IJ SOLN: 4.0000 mg | Freq: Four times a day (QID) | INTRAMUSCULAR | Status: DC | PRN

## 2020-06-16 MED ORDER — SODIUM CHLORIDE 0.9% FLUSH: 3.0000 mL | INTRAVENOUS | Status: DC | PRN

## 2020-06-16 MED ORDER — SODIUM CHLORIDE 0.9% FLUSH
3.0000 mL | Freq: Two times a day (BID) | INTRAVENOUS | Status: DC
  Administered 2020-06-16 – 2020-06-20 (×8): 3 mL via INTRAVENOUS

## 2020-06-16 MED ORDER — SODIUM CHLORIDE 0.9 % IV SOLN: 250.0000 mL | INTRAVENOUS | Status: DC | PRN

## 2020-06-16 MED ORDER — IOHEXOL 300 MG/ML  SOLN
100.0000 mL | Freq: Once | INTRAMUSCULAR | Status: AC | PRN
  Administered 2020-06-16: 100 mL via INTRAVENOUS

## 2020-06-16 MED ORDER — MAGNESIUM SULFATE 2 GM/50ML IV SOLN
2.0000 g | Freq: Once | INTRAVENOUS | Status: AC
  Administered 2020-06-16: 2 g via INTRAVENOUS
  Filled 2020-06-16: qty 50

## 2020-06-16 MED ORDER — FENTANYL CITRATE (PF) 100 MCG/2ML IJ SOLN
50.0000 ug | Freq: Once | INTRAMUSCULAR | Status: AC
  Administered 2020-06-16: 50 ug via INTRAVENOUS
  Filled 2020-06-16: qty 2

## 2020-06-16 NOTE — ED Triage Notes (Signed)
Pt BIB [facility] from home. Pt was feeling bad yesterday. 45 min PTA sudden onset of dizziness, nausea, abd pain, blurred vision, migraines. Pt bradycardic on arrival, in and out of a-flutter during transport. Pt a/o x4.

## 2020-06-16 NOTE — ED Notes (Signed)
PA and RN aware of BP
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
## 2020-06-17 ENCOUNTER — Inpatient Hospital Stay (HOSPITAL_COMMUNITY): Payer: Medicare Other

## 2020-06-17 DIAGNOSIS — R001 Bradycardia, unspecified: Secondary | ICD-10-CM | POA: Diagnosis not present

## 2020-06-17 DIAGNOSIS — I5021 Acute systolic (congestive) heart failure: Secondary | ICD-10-CM | POA: Diagnosis not present

## 2020-06-17 DIAGNOSIS — R29898 Other symptoms and signs involving the musculoskeletal system: Secondary | ICD-10-CM

## 2020-06-17 DIAGNOSIS — F319 Bipolar disorder, unspecified: Secondary | ICD-10-CM

## 2020-06-17 DIAGNOSIS — I25119 Atherosclerotic heart disease of native coronary artery with unspecified angina pectoris: Secondary | ICD-10-CM

## 2020-06-17 LAB — BASIC METABOLIC PANEL
Anion gap: 11 (ref 5–15)
BUN: 16 mg/dL (ref 6–20)
CO2: 25 mmol/L (ref 22–32)
Calcium: 8.9 mg/dL (ref 8.9–10.3)
Chloride: 104 mmol/L (ref 98–111)
Creatinine, Ser: 1.45 mg/dL — ABNORMAL HIGH (ref 0.61–1.24)
GFR, Estimated: 57 mL/min — ABNORMAL LOW (ref >60)
Glucose, Bld: 116 mg/dL — ABNORMAL HIGH (ref 70–99)
Potassium: 3.4 mmol/L — ABNORMAL LOW (ref 3.5–5.1)
Sodium: 140 mmol/L (ref 135–145)

## 2020-06-17 LAB — URINALYSIS, ROUTINE W REFLEX MICROSCOPIC
Bilirubin Urine: NEGATIVE
Glucose, UA: NEGATIVE mg/dL
Hgb urine dipstick: NEGATIVE
Ketones, ur: NEGATIVE mg/dL
Leukocytes,Ua: NEGATIVE
Nitrite: NEGATIVE
Protein, ur: 100 mg/dL — AB
Specific Gravity, Urine: 1.025 (ref 1.005–1.030)
pH: 6 (ref 5.0–8.0)

## 2020-06-17 LAB — HIV ANTIBODY (ROUTINE TESTING W REFLEX): HIV Screen 4th Generation wRfx: NONREACTIVE

## 2020-06-17 LAB — ECHOCARDIOGRAM COMPLETE
Area-P 1/2: 4.8 cm2
Height: 70 in
S' Lateral: 4.5 cm
Single Plane A4C EF: 24 %
Weight: 3806.02 oz

## 2020-06-17 LAB — URINALYSIS, MICROSCOPIC (REFLEX)
Bacteria, UA: NONE SEEN
Squamous Epithelial / HPF: NONE SEEN (ref 0–5)

## 2020-06-17 LAB — CREATININE, SERUM
Creatinine, Ser: 1.55 mg/dL — ABNORMAL HIGH (ref 0.61–1.24)
GFR, Estimated: 52 mL/min — ABNORMAL LOW (ref >60)

## 2020-06-17 LAB — MAGNESIUM: Magnesium: 1.9 mg/dL (ref 1.7–2.4)

## 2020-06-17 MED ORDER — TAMSULOSIN HCL 0.4 MG PO CAPS
0.4000 mg | ORAL_CAPSULE | Freq: Every day | ORAL | Status: DC
  Administered 2020-06-17 – 2020-06-21 (×5): 0.4 mg via ORAL
  Filled 2020-06-17 (×5): qty 1

## 2020-06-17 MED ORDER — SENNA 8.6 MG PO TABS: 1.0000 | ORAL_TABLET | Freq: Every day | ORAL | Status: DC | PRN

## 2020-06-17 MED ORDER — POLYETHYLENE GLYCOL 3350 17 G PO PACK
17.0000 g | PACK | Freq: Every day | ORAL | Status: DC | PRN
  Administered 2020-06-17: 17 g via ORAL
  Filled 2020-06-17: qty 1

## 2020-06-17 MED ORDER — ALBUTEROL SULFATE HFA 108 (90 BASE) MCG/ACT IN AERS
2.0000 | INHALATION_SPRAY | Freq: Four times a day (QID) | RESPIRATORY_TRACT | Status: DC | PRN
  Administered 2020-06-18: 2 via RESPIRATORY_TRACT
  Filled 2020-06-17: qty 6.7

## 2020-06-17 MED ORDER — ATORVASTATIN CALCIUM 80 MG PO TABS
80.0000 mg | ORAL_TABLET | Freq: Every day | ORAL | Status: DC
  Administered 2020-06-17 – 2020-06-21 (×5): 80 mg via ORAL
  Filled 2020-06-17 (×5): qty 1

## 2020-06-17 MED ORDER — PERFLUTREN LIPID MICROSPHERE
1.0000 mL | INTRAVENOUS | Status: AC | PRN
  Administered 2020-06-17: 5 mL via INTRAVENOUS
  Filled 2020-06-17: qty 10

## 2020-06-17 MED ORDER — HYDROCODONE-ACETAMINOPHEN 5-325 MG PO TABS
1.0000 | ORAL_TABLET | ORAL | Status: DC | PRN
  Administered 2020-06-17: 1 via ORAL
  Filled 2020-06-17: qty 1

## 2020-06-17 MED ORDER — MORPHINE SULFATE (PF) 4 MG/ML IV SOLN: 4.0000 mg | Freq: Once | INTRAVENOUS | Status: DC | PRN

## 2020-06-17 MED ORDER — POTASSIUM CHLORIDE CRYS ER 20 MEQ PO TBCR
40.0000 meq | EXTENDED_RELEASE_TABLET | Freq: Once | ORAL | Status: AC
  Administered 2020-06-17: 40 meq via ORAL
  Filled 2020-06-17: qty 2

## 2020-06-17 MED ORDER — NITROGLYCERIN 0.4 MG SL SUBL: 0.4000 mg | SUBLINGUAL_TABLET | SUBLINGUAL | Status: DC | PRN

## 2020-06-17 MED ORDER — OLOPATADINE HCL 0.1 % OP SOLN
1.0000 [drp] | Freq: Every day | OPHTHALMIC | Status: DC
  Administered 2020-06-18 – 2020-06-21 (×4): 1 [drp] via OPHTHALMIC
  Filled 2020-06-17: qty 5

## 2020-06-17 MED ORDER — GABAPENTIN 300 MG PO CAPS
300.0000 mg | ORAL_CAPSULE | Freq: Three times a day (TID) | ORAL | Status: DC
  Administered 2020-06-17 – 2020-06-21 (×12): 300 mg via ORAL
  Filled 2020-06-17 (×12): qty 1

## 2020-06-17 MED ORDER — SUMATRIPTAN SUCCINATE 50 MG PO TABS
50.0000 mg | ORAL_TABLET | ORAL | Status: DC | PRN
  Administered 2020-06-18: 50 mg via ORAL
  Filled 2020-06-17 (×2): qty 1
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
## 2020-06-17 NOTE — Progress Notes (Addendum)
PROGRESS NOTE    Cody Guzman  WJX:[id number] DOB: 18-Jul-1965 DOA: 06/16/2020 PCP: [name], MD    Chief Complaint  Patient presents with  . Bradycardia    Brief Narrative:  Cody Guzman is a 55 y.o. male with medical history significant of advanced cardiomyopathy with EF last reported of about 20%, coronary artery disease, chronic kidney disease stage III, morbid obesity, non-Hodgkin's lymphoma as a teenager, prior history of substance abuse, bipolar disorder, essential hypertension, gout. Who presented to the ER with abdominal pain dizziness and bloating.  Assessment & Plan:   Principal Problem:   Symptomatic bradycardia Active Problems:   Chronic kidney disease (CKD), stage II (mild)   Hypokalemia   Bipolar 1 disorder (HCC)   Coronary artery disease involving native coronary artery of native heart with angina pectoris (HCC)   NHL (non-Hodgkin's lymphoma) (HCC)   CAD / Ischemia cardiomyopathy:  Pt currently denies any chest pain.  Restart aspirin.   Chronic systolic heart failure:  He appears to be compensated. Continue with strict intake and output.  Echocardiogram ordered.   Symptomatic bradycardia:  Resolved with stopping coreg. Cardiology consulted for recommendations.   OSA.  Will need outpatient referral for sleep study.   Essential Hypertension Earlier pt has hypotension, which has resolved.     Bipolar Disorder  Recommend outpatient follow up with psychiatry.  Hypokalemia/  hypomagnesemia:  Replaced.   Body mass index is 34.13 kg/m. Obesity:  Outpatient follow up for weight loss and life style modifications.    NHL   Gout:  Stable.  Stage 2 CKD: Creatinine stable and at baseline.     Dysuria:  He was recently started antibiotics following which he has nausea, vomiting and diarrhea,w hich have resolved. Pt reports persistent abd pain in the lower quadrant CT abd and pelvis shows Mild distal esophageal wall  thickening, can be seen with reflux or esophagitis. Findings suggesting elevated right heart pressure with dilated hepatic veins and IVC. Right scrotal varicoceles which have progressed from prior exam, possibly due to right heart failure. No retroperitoneal obstruction. Indeterminate 12 mm lesion in the right kidney, possibly increased in size from 2020 exam. Recommend non emergent MRI characterization on an elective basis.    LLE weakness, dizziness:  MRI brain showed No acute intracranial abnormality. Patchy chronic encephalomalacia the anterior frontal lobes. Though nonspecific this pattern is frequently the sequelae of trauma. Superimposed signal heterogeneity in the cerebral white matter and basal ganglia, most commonly due to chronic small vessel Disease.  Therapy evaluations pending.   DVT prophylaxis: (Heparin) Code Status: (Full/ code) Family Communication: family at bedside.  Disposition:   Status is: Inpatient  Remains inpatient appropriate because:Unsafe d/c plan and Inpatient level of care appropriate due to severity of illness   Dispo: The patient is from: Home              Anticipated d/c is to: Home              Anticipated d/c date is: 2 days              Patient currently is not medically stable to d/c.   Difficult to place patient No       Level of care: Telemetry Cardiac Consultants:   Cardiology.   Procedures:  None.  Antimicrobials: None.   Subjective: No nausea, vomiting and diarrhea. No chest pain.  Headache persistent.    Objective: Vitals:   06/17/20 0100 06/17/20 0115 06/17/20 0223 06/17/20 0330  BP: 91/65 [name])  87/64 103/88   Pulse: 95 83 92   Resp:   18   Temp:   98.3 F (36.8 C)   TempSrc:   Oral Oral  SpO2: 94% 96% 95%   Weight:   107.9 kg   Height:   5\' 10"  (1.778 m)     Intake/Output Summary (Last 24 hours) at 06/17/2020 1335 Last data filed at 06/17/2020 0500 Gross per 24 hour  Intake 360 ml  Output 300 ml  Net 60 ml    Filed Weights   06/17/20 0223  Weight: 107.9 kg    Examination:  General exam: Appears calm and comfortable  Respiratory system: Clear to auscultation. Respiratory effort normal. Cardiovascular system: S1 & S2 heard, RRR. No JVD, . No pedal edema. Gastrointestinal system: Abdomen is nondistended, soft and nontender. Normal bowel sounds heard. Central nervous system: Alert and oriented. No focal neurological deficits. Extremities: Symmetric 5 x 5 power. Skin: No rashes, lesions or ulcers Psychiatry:Mood & affect appropriate.     Data Reviewed: I have personally reviewed following labs and imaging studies  CBC: Recent Labs  Lab 06/16/20 1628 06/16/20 2334  WBC 7.4 7.2  NEUTROABS 4.6  --   HGB 11.6* 13.4  HCT 37.3* 39.8  MCV 94.7 88.1  PLT 204 378    Basic Metabolic Panel: Recent Labs  Lab 06/16/20 1628 06/16/20 2334 06/17/20 0232  NA 142  --  140  K 3.0*  --  3.4*  CL 110  --  104  CO2 20*  --  25  GLUCOSE 87  --  116*  BUN 18  --  16  CREATININE 1.44* 1.55* 1.45*  CALCIUM 8.0*  --  8.9  MG 1.3*  --  1.9    GFR: Estimated Creatinine Clearance: 70 mL/min (A) (by C-G formula based on SCr of 1.45 mg/dL (H)).  Liver Function Tests: Recent Labs  Lab 06/16/20 1628  AST 25  ALT 17  ALKPHOS 70  BILITOT 1.1  PROT 5.9*  ALBUMIN 3.3*    CBG: No results for input(s): GLUCAP in the last 168 hours.   Recent Results (from the past 240 hour(s))  Resp Panel by RT-PCR (Flu A&B, Covid) Nasopharyngeal Swab     Status: None   Collection Time: 06/16/20  4:28 PM   Specimen: Nasopharyngeal Swab; Nasopharyngeal(NP) swabs in vial transport medium  Result Value Ref Range Status   SARS Coronavirus 2 by RT PCR NEGATIVE NEGATIVE Final    Comment: (NOTE) SARS-CoV-2 target nucleic acids are NOT DETECTED.  The SARS-CoV-2 RNA is generally detectable in upper respiratory specimens during the acute phase of infection. The lowest concentration of SARS-CoV-2 viral copies  this assay can detect is 138 copies/mL. A negative result does not preclude SARS-Cov-2 infection and should not be used as the sole basis for treatment or other patient management decisions. A negative result may occur with  improper specimen collection/handling, submission of specimen other than nasopharyngeal swab, presence of viral mutation(s) within the areas targeted by this assay, and inadequate number of viral copies(<138 copies/mL). A negative result must be combined with clinical observations, patient history, and epidemiological information. The expected result is Negative.  Fact Sheet for Patients:  EntrepreneurPulse.com.au  Fact Sheet for Healthcare Providers:  IncredibleEmployment.be  This test is no t yet approved or cleared by the [place] FDA and  has been authorized for detection and/or diagnosis of SARS-CoV-2 by FDA under an Emergency Use Authorization (EUA). This EUA will remain  in effect (meaning this  test can be used) for the duration of the COVID-19 declaration under Section 564(b)(1) of the Act, 21 U.S.C.section 360bbb-3(b)(1), unless the authorization is terminated  or revoked sooner.       Influenza A by PCR NEGATIVE NEGATIVE Final   Influenza B by PCR NEGATIVE NEGATIVE Final    Comment: (NOTE) The Xpert Xpress SARS-CoV-2/FLU/RSV plus assay is intended as an aid in the diagnosis of influenza from Nasopharyngeal swab specimens and should not be used as a sole basis for treatment. Nasal washings and aspirates are unacceptable for Xpert Xpress SARS-CoV-2/FLU/RSV testing.  Fact Sheet for Patients: EntrepreneurPulse.com.au  Fact Sheet for Healthcare Providers: IncredibleEmployment.be  This test is not yet approved or cleared by the [place] FDA and has been authorized for detection and/or diagnosis of SARS-CoV-2 by FDA under an Emergency Use Authorization (EUA). This EUA  will remain in effect (meaning this test can be used) for the duration of the COVID-19 declaration under Section 564(b)(1) of the Act, 21 U.S.C. section 360bbb-3(b)(1), unless the authorization is terminated or revoked.  Performed at [facility], [place] [address] [place],  [address]          Radiology Studies: DG Chest 2 View  Result Date: 06/16/2020 CLINICAL DATA:  Dizzy this and bradycardia EXAM: CHEST - 2 VIEW COMPARISON:  March 23, 2019 FINDINGS: There is no edema or airspace opacity. There is cardiomegaly with pulmonary venous hypertension. No adenopathy. No bone lesions. IMPRESSION: Cardiomegaly with pulmonary vascular congestion.  Lungs clear. Electronically Signed   By: Lowella Grip III M.D.   On: 06/16/2020 17:18   MR BRAIN WO CONTRAST  Result Date: 06/17/2020 CLINICAL DATA:  55 year old male with neurologic deficit. Symptomatic bradycardia. EXAM: MRI HEAD WITHOUT CONTRAST TECHNIQUE: Multiplanar, multiecho pulse sequences of the brain and surrounding structures were obtained without intravenous contrast. COMPARISON:  None. FINDINGS: Brain: No restricted diffusion to suggest acute infarction. No midline shift, mass effect, evidence of mass lesion, ventriculomegaly, extra-axial collection or acute intracranial hemorrhage. Cervicomedullary junction and pituitary are within normal limits. Cavum septum pellucidum, normal variant. Patchy chronic encephalomalacia in both anterior frontal lobes, left superior frontal and right inferior frontal gyri. No definite chronic cerebral blood products. Patchy additional nonspecific bilateral cerebral white matter T2 and FLAIR hyperintensity which is more symmetric. Mild T2 heterogeneity in the bilateral basal ganglia more resemble small-vessel disease than perivascular spaces. Thalami, and brainstem appear negative. Questionable small chronic cerebellar infarcts medially on series 10, image 7. Vascular: Major intracranial vascular  flow voids are preserved. Mild generalized intracranial artery tortuosity. Skull and upper cervical spine: Negative visible cervical spine. Visualized bone marrow signal is within normal limits. Sinuses/Orbits: Negative orbits. Trace paranasal sinus mucosal thickening. Other: Mastoids are clear. Visible internal auditory structures appear normal. Scalp and face soft tissues appear negative. IMPRESSION: 1. No acute intracranial abnormality. 2. Patchy chronic encephalomalacia the anterior frontal lobes. Though nonspecific this pattern is frequently the sequelae of trauma. 3. Superimposed signal heterogeneity in the cerebral white matter and basal ganglia, most commonly due to chronic small vessel disease. Electronically Signed   By: Genevie Ann M.D.   On: 06/17/2020 05:58   CT ABDOMEN PELVIS W CONTRAST  Result Date: 06/16/2020 CLINICAL DATA:  Acute abdominal pain and distension.  Nausea. EXAM: CT ABDOMEN AND PELVIS WITH CONTRAST TECHNIQUE: Multidetector CT imaging of the abdomen and pelvis was performed using the standard protocol following bolus administration of intravenous contrast. CONTRAST:  131mL OMNIPAQUE IOHEXOL 300 MG/ML  SOLN COMPARISON:  CT 07/06/2018  FINDINGS: Lower chest: Upper normal heart size with coronary artery calcifications. No acute airspace disease or pleural fluid. Hepatobiliary: No focal liver abnormality is seen. Borderline decreased hepatic density suggesting steatosis. Prominence of the IVC and hepatic veins. Gallbladder partially distended. No calcified gallstone. No pericholecystic fat stranding. There is no biliary dilatation. Pancreas: Unremarkable. No pancreatic ductal dilatation or surrounding inflammatory changes. Spleen: Normal in size without focal abnormality. Adrenals/Urinary Tract: Stable low-density thickening of the left adrenal gland. The previous right adrenal nodule is less nodular on the current exam, no increase in size. No hydronephrosis or perinephric edema. Minimal  cortical scarring in the lower left kidney. Indeterminate 12 mm partially exophytic lesion from the lower right kidney, series 3, image 4, possibly increased in size from prior. Additional small subcentimeter renal cortical hypodensities are too small to characterize. No visualized renal calculi. Urinary bladder is unremarkable. Stomach/Bowel: Mild distal esophageal wall thickening. Fluid/ingested material distends the stomach. No definite gastric wall thickening. Duodenal diverticulum arising from the third and fourth portion without inflammation. Normal positioning of the ligament of Treitz. No small bowel obstruction or inflammation. Mild fecalization of distal small bowel contents. Normal appendix. Majority of the colon is decompressed. No colonic wall thickening or inflammation. Vascular/Lymphatic: Mild aortic and moderate bi-iliac atherosclerosis. No aneurysm. Patent portal vein. Right scrotal varicoceles which have progressed from prior exam. No retroperitoneal obstruction. No enlarged lymph nodes in the abdomen or pelvis. Reproductive: Prostate is unremarkable. Other: No ascites or free air. Small fat containing umbilical hernia. Musculoskeletal: There are no acute or suspicious osseous abnormalities. Stable peripherally sclerotic lucency in the intertrochanteric right femur, unchanged. Mild lumbar spondylosis. IMPRESSION: 1. Mild distal esophageal wall thickening, can be seen with reflux or esophagitis. 2. Findings suggesting elevated right heart pressure with dilated hepatic veins and IVC. Right scrotal varicoceles which have progressed from prior exam, possibly due to right heart failure. No retroperitoneal obstruction. 3. Indeterminate 12 mm lesion in the right kidney, possibly increased in size from 2020 exam. Recommend non emergent MRI characterization on an elective basis. 4. Previous right adrenal nodule appears less nodular on the current exam, no progression or increase in size. Aortic  Atherosclerosis (ICD10-I70.0). Electronically Signed   By: Keith Rake M.D.   On: 06/16/2020 19:12   ECHOCARDIOGRAM COMPLETE  Result Date: 06/17/2020    ECHOCARDIOGRAM REPORT   Patient Name:   Cody Guzman Date of Exam: 06/17/2020 Medical Rec #:  [id number]           Height:       70.0 in Accession #:    [id number]          Weight:       237.9 lb Date of Birth:  27-Mar-1966            BSA:          2.247 m Patient Age:    34 years            BP:           103/88 mmHg Patient Gender: M                   HR:           95 bpm. Exam Location:  Inpatient Procedure: 2D Echo, Color Doppler, Cardiac Doppler and Intracardiac            Opacification Agent Indications:    N02.72 Acute systolic (congestive) heart failure  History:        Patient has  prior history of Echocardiogram examinations, most                 recent 07/09/2018. CHF, CAD, Pulmonary HTN; Risk                 Factors:Dyslipidemia and Sleep Apnea.  Sonographer:    Raquel Sarna Senior RDCS Referring Phys: [place]  1. Left ventricular ejection fraction, by estimation, is 20 to 25%. The left ventricle has severely decreased function. The basal-to-apical anterior, basal-to-mid septal (infero and anteroseptal), mid-to-apical lateral (infero and anterolateral) LV walls and apex appear akinetic. Indeterminate diastolic filling due to E-A fusion.  2. Right ventricular systolic function is severely reduced. The right ventricular size is mildly enlarged. There is mildly elevated pulmonary artery systolic pressure.  3. Left atrial size was severely dilated.  4. Right atrial size was severely dilated.  5. The mitral valve is grossly normal. At least moderate, eccentric, posterolaterally directed mitral valve regurgitation.  6. Tricuspid valve regurgitation is moderate due to lack of central coaptation of the leaflets.  7. The aortic valve is tricuspid. There is mild calcification of the aortic valve. There is mild thickening of the aortic  valve. Aortic valve regurgitation is trivial. Mild aortic valve sclerosis is present, with no evidence of aortic valve stenosis.  8. The inferior vena cava is dilated in size with <50% respiratory variability, suggesting right atrial pressure of 15 mmHg. Comparison(s): Compared to prior study on 07/07/18, there again is severely reduced LVEF with severely elevated filling pressures. FINDINGS  Left Ventricle: Left ventricular ejection fraction, by estimation, is 20 to 25%. The left ventricle has severely decreased function. The left ventricle demonstrates regional wall motion abnormalities. Definity contrast agent was given IV to delineate the left ventricular endocardial borders. The basal-to-apical anterior, basal-to-mid septal (infero and anteroseptal), mid-to-apical lateral (inferolateral and anterolateral) LV walls and apex appear akinetic. The left ventricular internal cavity size was normal in size. There is no left ventricular hypertrophy. Indeterminate diastolic filling due to E-A fusion. Right Ventricle: The right ventricular size is mildly enlarged. No increase in right ventricular wall thickness. Right ventricular systolic function is severely reduced. There is mildly elevated pulmonary artery systolic pressure. The tricuspid regurgitant velocity is 2.54 m/s, and with an assumed right atrial pressure of 15 mmHg, the estimated right ventricular systolic pressure is 46.9 mmHg. Left Atrium: Left atrial size was severely dilated. Right Atrium: Right atrial size was severely dilated. Pericardium: There is no evidence of pericardial effusion. Mitral Valve: The mitral valve is grossly normal. There is mild thickening of the mitral valve leaflet(s). There is mild calcification of the mitral valve leaflet(s). Mild mitral annular calcification. Moderate mitral valve regurgitation. Tricuspid Valve: The tricuspid valve is normal in structure. Tricuspid valve regurgitation is moderate. Aortic Valve: The aortic valve is  tricuspid. There is mild calcification of the aortic valve. There is mild thickening of the aortic valve. Aortic valve regurgitation is trivial. Mild aortic valve sclerosis is present, with no evidence of aortic valve stenosis. Pulmonic Valve: The pulmonic valve was normal in structure. Pulmonic valve regurgitation is trivial. Aorta: The aortic root and ascending aorta are structurally normal, with no evidence of dilitation. Venous: The inferior vena cava is dilated in size with less than 50% respiratory variability, suggesting right atrial pressure of 15 mmHg. IAS/Shunts: No atrial level shunt detected by color flow Doppler.  LEFT VENTRICLE PLAX 2D LVIDd:         5.20 cm LVIDs:  4.50 cm LV PW:         1.20 cm LV IVS:        1.10 cm LVOT diam:     2.10 cm LV SV:         51 LV SV Index:   23 LVOT Area:     3.46 cm  LV Volumes (MOD) LV vol d, MOD A4C: 183.0 ml LV vol s, MOD A4C: 139.0 ml LV SV MOD A4C:     183.0 ml RIGHT VENTRICLE RV S prime:     5.11 cm/s TAPSE (M-mode): 1.1 cm LEFT ATRIUM              Index       RIGHT ATRIUM           Index LA diam:        5.50 cm  2.45 cm/m  RA Area:     24.60 cm LA Vol (A2C):   79.6 ml  35.43 ml/m RA Volume:   73.30 ml  32.62 ml/m LA Vol (A4C):   107.0 ml 47.62 ml/m LA Biplane Vol: 93.4 ml  41.57 ml/m  AORTIC VALVE LVOT Vmax:   92.30 cm/s LVOT Vmean:  66.800 cm/s LVOT VTI:    0.147 m  AORTA Ao Root diam: 3.10 cm Ao Asc diam:  3.30 cm MITRAL VALVE                TRICUSPID VALVE MV Area (PHT): 4.80 cm     TR Peak grad:   25.8 mmHg MV Decel Time: 158 msec     TR Vmax:        254.00 cm/s MV E velocity: 132.00 cm/s MV A velocity: 41.00 cm/s   SHUNTS MV E/A ratio:  3.22         Systemic VTI:  0.15 m                             Systemic Diam: 2.10 cm Gwyndolyn Kaufman MD Electronically signed by Gwyndolyn Kaufman MD Signature Date/Time: 06/17/2020/12:50:54 PM    Final         Scheduled Meds: . heparin  5,000 Units Subcutaneous Q8H  . sodium chloride flush  3 mL  Intravenous Q12H   Continuous Infusions: . sodium chloride       LOS: 1 day       Hosie Poisson, MD [facility]   To contact the attending provider between 7A-7P or the covering provider during after hours 7P-7A, please log into the web site [email or web address] and access using universal [place] password for that web site. If you do not have the password, please call the hospital operator.  06/17/2020, 1:35 PM

## 2020-06-18 ENCOUNTER — Encounter (HOSPITAL_COMMUNITY): Payer: Self-pay | Admitting: Internal Medicine

## 2020-06-18 DIAGNOSIS — F319 Bipolar disorder, unspecified: Secondary | ICD-10-CM | POA: Diagnosis not present

## 2020-06-18 DIAGNOSIS — R001 Bradycardia, unspecified: Secondary | ICD-10-CM | POA: Diagnosis not present

## 2020-06-18 DIAGNOSIS — I5020 Unspecified systolic (congestive) heart failure: Secondary | ICD-10-CM | POA: Diagnosis not present

## 2020-06-18 DIAGNOSIS — I2510 Atherosclerotic heart disease of native coronary artery without angina pectoris: Secondary | ICD-10-CM

## 2020-06-18 DIAGNOSIS — E876 Hypokalemia: Secondary | ICD-10-CM

## 2020-06-18 DIAGNOSIS — I5082 Biventricular heart failure: Secondary | ICD-10-CM | POA: Diagnosis not present

## 2020-06-18 DIAGNOSIS — I4892 Unspecified atrial flutter: Secondary | ICD-10-CM | POA: Diagnosis not present

## 2020-06-18 DIAGNOSIS — I25119 Atherosclerotic heart disease of native coronary artery with unspecified angina pectoris: Secondary | ICD-10-CM | POA: Diagnosis not present

## 2020-06-18 LAB — BASIC METABOLIC PANEL
Anion gap: 11 (ref 5–15)
BUN: 13 mg/dL (ref 6–20)
CO2: 24 mmol/L (ref 22–32)
Calcium: 9 mg/dL (ref 8.9–10.3)
Chloride: 104 mmol/L (ref 98–111)
Creatinine, Ser: 1.21 mg/dL (ref 0.61–1.24)
GFR, Estimated: >60 mL/min (ref >60)
Glucose, Bld: 97 mg/dL (ref 70–99)
Potassium: 3.3 mmol/L — ABNORMAL LOW (ref 3.5–5.1)
Sodium: 139 mmol/L (ref 135–145)

## 2020-06-18 LAB — MAGNESIUM: Magnesium: 1.9 mg/dL (ref 1.7–2.4)

## 2020-06-18 MED ORDER — SPIRONOLACTONE 12.5 MG HALF TABLET
12.5000 mg | ORAL_TABLET | Freq: Every day | ORAL | Status: DC
  Administered 2020-06-18 – 2020-06-20 (×3): 12.5 mg via ORAL
  Filled 2020-06-18 (×3): qty 1

## 2020-06-18 MED ORDER — IPRATROPIUM-ALBUTEROL 0.5-2.5 (3) MG/3ML IN SOLN: 3.0000 mL | Freq: Four times a day (QID) | RESPIRATORY_TRACT | Status: DC | PRN

## 2020-06-18 MED ORDER — SUMATRIPTAN SUCCINATE 50 MG PO TABS
50.0000 mg | ORAL_TABLET | ORAL | Status: DC | PRN
  Filled 2020-06-18: qty 1

## 2020-06-18 MED ORDER — HEPARIN (PORCINE) 25000 UT/250ML-% IV SOLN
1550.0000 [IU]/h | INTRAVENOUS | Status: DC
  Filled 2020-06-18: qty 250

## 2020-06-18 MED ORDER — FUROSEMIDE 10 MG/ML IJ SOLN
60.0000 mg | Freq: Two times a day (BID) | INTRAMUSCULAR | Status: DC
  Administered 2020-06-18 – 2020-06-19 (×3): 60 mg via INTRAVENOUS
  Filled 2020-06-18 (×3): qty 6

## 2020-06-18 MED ORDER — POTASSIUM CHLORIDE CRYS ER 20 MEQ PO TBCR
40.0000 meq | EXTENDED_RELEASE_TABLET | Freq: Two times a day (BID) | ORAL | Status: AC
  Administered 2020-06-18 (×2): 40 meq via ORAL
  Filled 2020-06-18 (×2): qty 2

## 2020-06-18 MED ORDER — FUROSEMIDE 10 MG/ML IJ SOLN
60.0000 mg | Freq: Once | INTRAMUSCULAR | Status: AC
  Administered 2020-06-18: 60 mg via INTRAVENOUS
  Filled 2020-06-18: qty 6

## 2020-06-18 MED ORDER — APIXABAN 5 MG PO TABS
5.0000 mg | ORAL_TABLET | Freq: Two times a day (BID) | ORAL | Status: DC
  Administered 2020-06-18 – 2020-06-21 (×6): 5 mg via ORAL
  Filled 2020-06-18 (×6): qty 1

## 2020-06-18 MED ORDER — HEPARIN BOLUS VIA INFUSION
3000.0000 [IU] | Freq: Once | INTRAVENOUS | Status: DC
  Filled 2020-06-18: qty 3000
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
## 2020-06-18 NOTE — Progress Notes (Signed)
PROGRESS NOTE    Cody Guzman  UMP:[id number] DOB: 12-Nov-1965 DOA: 06/16/2020 PCP: [name], MD    Chief Complaint  Patient presents with  . Bradycardia    Brief Narrative:  Cody Guzman is a 55 y.o. male with medical history significant of advanced cardiomyopathy with EF last reported of about 20%, coronary artery disease, chronic kidney disease stage III, morbid obesity, non-Hodgkin's lymphoma as a teenager, prior history of substance abuse, bipolar disorder, essential hypertension, gout. Who presented to the ER with abdominal pain dizziness and bloating. Pt reports his symptoms of abdominal pain and dizziness have resolved. He has some residual headache.  No chest pain or sob.  Wants to know when he can go home.   Assessment & Plan:   Principal Problem:   Symptomatic bradycardia Active Problems:   Chronic kidney disease (CKD), stage II (mild)   Hypokalemia   Bipolar 1 disorder (HCC)   Coronary artery disease involving native coronary artery of native heart with angina pectoris (HCC)   NHL (non-Hodgkin's lymphoma) (HCC)   CAD / Ischemia cardiomyopathy:  Pt currently denies any chest pain.  Restart aspirin as cardiology recommendations.   Chronic systolic heart failure:  Poor follow up with cadiology.  Currently all his mets are on hold due to borderline low bp parameters.  He appears to be compensated. Continue with strict intake and output.  Echocardiogram ordered showed LVEF of 20% with RV dysfunction .   Symptomatic bradycardia/Atrial flutter with variable conduction  Resolved with stopping coreg. Not on anti coagulation due to ? Non compliance.  CHA2DS2-VASc score is 3, defer anticoagulation to cardiology. Cardiology consulted for recommendations.  Echocardiogram showed right and left atrium severely dilated.  Cardiology recommends TEE DCCV prior to admission.   OSA.  Will need outpatient referral for sleep study.   Essential  Hypertension bp parameters are still borderline.     Bipolar Disorder  Recommend outpatient follow up with psychiatry.  Hypokalemia/  hypomagnesemia:  Replaced. Repeat in am.   Body mass index is 34.13 kg/m. Obesity:  Outpatient follow up for weight loss and life style modifications.    NHL   Gout:  Stable.  Stage 2 CKD: Creatinine stable and better than baseline.     Dysuria:  He was recently started antibiotics following which he has nausea, vomiting and diarrhea,w hich have resolved.repeat UA does not show any infection.  CT abd and pelvis shows Mild distal esophageal wall thickening, can be seen with reflux or esophagitis. Findings suggesting elevated right heart pressure with dilated hepatic veins and IVC. Right scrotal varicoceles which have progressed from prior exam, possibly due to right heart failure. No retroperitoneal obstruction. Indeterminate 12 mm lesion in the right kidney, possibly increased in size from 2020 exam. Recommend non emergent MRI characterization on an elective basis.    LLE weakness, dizziness:  MRI brain showed No acute intracranial abnormality. Patchy chronic encephalomalacia the anterior frontal lobes. Though nonspecific this pattern is frequently the sequelae of trauma. Superimposed signal heterogeneity in the cerebral white matter and basal ganglia, most commonly due to chronic small vessel Disease.  Therapy evaluations did not recommend any follow up.  Pt reports weakness and dizziness have resolved.   DVT prophylaxis: scd's Code Status: (Full/ code) Family Communication: family at bedside.  Disposition:   Status is: Inpatient  Remains inpatient appropriate because:Unsafe d/c plan and Inpatient level of care appropriate due to severity of illness   Dispo: The patient is from: Home  Anticipated d/c is to: Home              Anticipated d/c date is: 2 days              Patient currently is not medically stable to  d/c.   Difficult to place patient No       Level of care: Telemetry Cardiac Consultants:   Cardiology.   Procedures:  None.  Antimicrobials: None.   Subjective: Pt reports his symptoms of abdominal pain and dizziness have resolved. He has some residual headache.  No chest pain or sob.  Wants to know when he can go home.   Objective: Vitals:   06/17/20 0223 06/17/20 0330 06/17/20 1430 06/17/20 2000  BP: 103/88  101/78 94/75  Pulse: 92     Resp: 18  18 18   Temp: 98.3 F (36.8 C)  98.5 F (36.9 C) 98.5 F (36.9 C)  TempSrc: Oral Oral Oral Oral  SpO2: 95%     Weight: 107.9 kg     Height: 5\' 10"  (1.778 m)       Intake/Output Summary (Last 24 hours) at 06/18/2020 1323 Last data filed at 06/18/2020 1208 Gross per 24 hour  Intake 1200 ml  Output 750 ml  Net 450 ml   Filed Weights   06/17/20 0223  Weight: 107.9 kg    Examination:  General exam: Alert and comfortable, not in any kind of distress Respiratory system: Air entry fair bilateral, no wheezing or rhonchi Cardiovascular system: S1-S2 heard, regular rate rhythm, no pedal edema Gastrointestinal system: Soft, nontender bowel sounds normal Central nervous system: Alert and oriented, grossly nonfocal Extremities: No pedal edema Skin: No rashes seen Psychiatry: Mood is appropriate   Data Reviewed: I have personally reviewed following labs and imaging studies  CBC: Recent Labs  Lab 06/16/20 1628 06/16/20 2334  WBC 7.4 7.2  NEUTROABS 4.6  --   HGB 11.6* 13.4  HCT 37.3* 39.8  MCV 94.7 88.1  PLT 204 250    Basic Metabolic Panel: Recent Labs  Lab 06/16/20 1628 06/16/20 2334 06/17/20 0232 06/18/20 0132 06/18/20 1136  NA 142  --  140 139  --   K 3.0*  --  3.4* 3.3*  --   CL 110  --  104 104  --   CO2 20*  --  25 24  --   GLUCOSE 87  --  116* 97  --   BUN 18  --  16 13  --   CREATININE 1.44* 1.55* 1.45* 1.21  --   CALCIUM 8.0*  --  8.9 9.0  --   MG 1.3*  --  1.9  --  1.9    GFR: Estimated  Creatinine Clearance: 83.9 mL/min (by C-G formula based on SCr of 1.21 mg/dL).  Liver Function Tests: Recent Labs  Lab 06/16/20 1628  AST 25  ALT 17  ALKPHOS 70  BILITOT 1.1  PROT 5.9*  ALBUMIN 3.3*    CBG: No results for input(s): GLUCAP in the last 168 hours.   Recent Results (from the past 240 hour(s))  Resp Panel by RT-PCR (Flu A&B, Covid) Nasopharyngeal Swab     Status: None   Collection Time: 06/16/20  4:28 PM   Specimen: Nasopharyngeal Swab; Nasopharyngeal(NP) swabs in vial transport medium  Result Value Ref Range Status   SARS Coronavirus 2 by RT PCR NEGATIVE NEGATIVE Final    Comment: (NOTE) SARS-CoV-2 target nucleic acids are NOT DETECTED.  The SARS-CoV-2 RNA is generally detectable  in upper respiratory specimens during the acute phase of infection. The lowest concentration of SARS-CoV-2 viral copies this assay can detect is 138 copies/mL. A negative result does not preclude SARS-Cov-2 infection and should not be used as the sole basis for treatment or other patient management decisions. A negative result may occur with  improper specimen collection/handling, submission of specimen other than nasopharyngeal swab, presence of viral mutation(s) within the areas targeted by this assay, and inadequate number of viral copies(<138 copies/mL). A negative result must be combined with clinical observations, patient history, and epidemiological information. The expected result is Negative.  Fact Sheet for Patients:  EntrepreneurPulse.com.au  Fact Sheet for Healthcare Providers:  IncredibleEmployment.be  This test is no t yet approved or cleared by the [place] FDA and  has been authorized for detection and/or diagnosis of SARS-CoV-2 by FDA under an Emergency Use Authorization (EUA). This EUA will remain  in effect (meaning this test can be used) for the duration of the COVID-19 declaration under Section 564(b)(1) of the Act,  21 U.S.C.section 360bbb-3(b)(1), unless the authorization is terminated  or revoked sooner.       Influenza A by PCR NEGATIVE NEGATIVE Final   Influenza B by PCR NEGATIVE NEGATIVE Final    Comment: (NOTE) The Xpert Xpress SARS-CoV-2/FLU/RSV plus assay is intended as an aid in the diagnosis of influenza from Nasopharyngeal swab specimens and should not be used as a sole basis for treatment. Nasal washings and aspirates are unacceptable for Xpert Xpress SARS-CoV-2/FLU/RSV testing.  Fact Sheet for Patients: EntrepreneurPulse.com.au  Fact Sheet for Healthcare Providers: IncredibleEmployment.be  This test is not yet approved or cleared by the [place] FDA and has been authorized for detection and/or diagnosis of SARS-CoV-2 by FDA under an Emergency Use Authorization (EUA). This EUA will remain in effect (meaning this test can be used) for the duration of the COVID-19 declaration under Section 564(b)(1) of the Act, 21 U.S.C. section 360bbb-3(b)(1), unless the authorization is terminated or revoked.  Performed at [facility], [place] [address] [place],  [address]          Radiology Studies: DG Chest 2 View  Result Date: 06/16/2020 CLINICAL DATA:  Dizzy this and bradycardia EXAM: CHEST - 2 VIEW COMPARISON:  March 23, 2019 FINDINGS: There is no edema or airspace opacity. There is cardiomegaly with pulmonary venous hypertension. No adenopathy. No bone lesions. IMPRESSION: Cardiomegaly with pulmonary vascular congestion.  Lungs clear. Electronically Signed   By: Lowella Grip III M.D.   On: 06/16/2020 17:18   MR BRAIN WO CONTRAST  Result Date: 06/17/2020 CLINICAL DATA:  55 year old male with neurologic deficit. Symptomatic bradycardia. EXAM: MRI HEAD WITHOUT CONTRAST TECHNIQUE: Multiplanar, multiecho pulse sequences of the brain and surrounding structures were obtained without intravenous contrast. COMPARISON:  None.  FINDINGS: Brain: No restricted diffusion to suggest acute infarction. No midline shift, mass effect, evidence of mass lesion, ventriculomegaly, extra-axial collection or acute intracranial hemorrhage. Cervicomedullary junction and pituitary are within normal limits. Cavum septum pellucidum, normal variant. Patchy chronic encephalomalacia in both anterior frontal lobes, left superior frontal and right inferior frontal gyri. No definite chronic cerebral blood products. Patchy additional nonspecific bilateral cerebral white matter T2 and FLAIR hyperintensity which is more symmetric. Mild T2 heterogeneity in the bilateral basal ganglia more resemble small-vessel disease than perivascular spaces. Thalami, and brainstem appear negative. Questionable small chronic cerebellar infarcts medially on series 10, image 7. Vascular: Major intracranial vascular flow voids are preserved. Mild generalized intracranial artery tortuosity. Skull and  upper cervical spine: Negative visible cervical spine. Visualized bone marrow signal is within normal limits. Sinuses/Orbits: Negative orbits. Trace paranasal sinus mucosal thickening. Other: Mastoids are clear. Visible internal auditory structures appear normal. Scalp and face soft tissues appear negative. IMPRESSION: 1. No acute intracranial abnormality. 2. Patchy chronic encephalomalacia the anterior frontal lobes. Though nonspecific this pattern is frequently the sequelae of trauma. 3. Superimposed signal heterogeneity in the cerebral white matter and basal ganglia, most commonly due to chronic small vessel disease. Electronically Signed   By: Genevie Ann M.D.   On: 06/17/2020 05:58   CT ABDOMEN PELVIS W CONTRAST  Result Date: 06/16/2020 CLINICAL DATA:  Acute abdominal pain and distension.  Nausea. EXAM: CT ABDOMEN AND PELVIS WITH CONTRAST TECHNIQUE: Multidetector CT imaging of the abdomen and pelvis was performed using the standard protocol following bolus administration of intravenous  contrast. CONTRAST:  194mL OMNIPAQUE IOHEXOL 300 MG/ML  SOLN COMPARISON:  CT 07/06/2018 FINDINGS: Lower chest: Upper normal heart size with coronary artery calcifications. No acute airspace disease or pleural fluid. Hepatobiliary: No focal liver abnormality is seen. Borderline decreased hepatic density suggesting steatosis. Prominence of the IVC and hepatic veins. Gallbladder partially distended. No calcified gallstone. No pericholecystic fat stranding. There is no biliary dilatation. Pancreas: Unremarkable. No pancreatic ductal dilatation or surrounding inflammatory changes. Spleen: Normal in size without focal abnormality. Adrenals/Urinary Tract: Stable low-density thickening of the left adrenal gland. The previous right adrenal nodule is less nodular on the current exam, no increase in size. No hydronephrosis or perinephric edema. Minimal cortical scarring in the lower left kidney. Indeterminate 12 mm partially exophytic lesion from the lower right kidney, series 3, image 4, possibly increased in size from prior. Additional small subcentimeter renal cortical hypodensities are too small to characterize. No visualized renal calculi. Urinary bladder is unremarkable. Stomach/Bowel: Mild distal esophageal wall thickening. Fluid/ingested material distends the stomach. No definite gastric wall thickening. Duodenal diverticulum arising from the third and fourth portion without inflammation. Normal positioning of the ligament of Treitz. No small bowel obstruction or inflammation. Mild fecalization of distal small bowel contents. Normal appendix. Majority of the colon is decompressed. No colonic wall thickening or inflammation. Vascular/Lymphatic: Mild aortic and moderate bi-iliac atherosclerosis. No aneurysm. Patent portal vein. Right scrotal varicoceles which have progressed from prior exam. No retroperitoneal obstruction. No enlarged lymph nodes in the abdomen or pelvis. Reproductive: Prostate is unremarkable. Other: No  ascites or free air. Small fat containing umbilical hernia. Musculoskeletal: There are no acute or suspicious osseous abnormalities. Stable peripherally sclerotic lucency in the intertrochanteric right femur, unchanged. Mild lumbar spondylosis. IMPRESSION: 1. Mild distal esophageal wall thickening, can be seen with reflux or esophagitis. 2. Findings suggesting elevated right heart pressure with dilated hepatic veins and IVC. Right scrotal varicoceles which have progressed from prior exam, possibly due to right heart failure. No retroperitoneal obstruction. 3. Indeterminate 12 mm lesion in the right kidney, possibly increased in size from 2020 exam. Recommend non emergent MRI characterization on an elective basis. 4. Previous right adrenal nodule appears less nodular on the current exam, no progression or increase in size. Aortic Atherosclerosis (ICD10-I70.0). Electronically Signed   By: Keith Rake M.D.   On: 06/16/2020 19:12   ECHOCARDIOGRAM COMPLETE  Result Date: 06/17/2020    ECHOCARDIOGRAM REPORT   Patient Name:   MARSHUN DUVA Date of Exam: 06/17/2020 Medical Rec #:  [id number]           Height:       70.0 in Accession #:  [id number]          Weight:       237.9 lb Date of Birth:  1965/08/05            BSA:          2.247 m Patient Age:    58 years            BP:           103/88 mmHg Patient Gender: M                   HR:           95 bpm. Exam Location:  Inpatient Procedure: 2D Echo, Color Doppler, Cardiac Doppler and Intracardiac            Opacification Agent Indications:    J88.41 Acute systolic (congestive) heart failure  History:        Patient has prior history of Echocardiogram examinations, most                 recent 07/09/2018. CHF, CAD, Pulmonary HTN; Risk                 Factors:Dyslipidemia and Sleep Apnea.  Sonographer:    Raquel Sarna Senior RDCS Referring Phys: [place]  1. Left ventricular ejection fraction, by estimation, is 20 to 25%. The left ventricle has  severely decreased function. The basal-to-apical anterior, basal-to-mid septal (infero and anteroseptal), mid-to-apical lateral (infero and anterolateral) LV walls and apex appear akinetic. Indeterminate diastolic filling due to E-A fusion.  2. Right ventricular systolic function is severely reduced. The right ventricular size is mildly enlarged. There is mildly elevated pulmonary artery systolic pressure.  3. Left atrial size was severely dilated.  4. Right atrial size was severely dilated.  5. The mitral valve is grossly normal. At least moderate, eccentric, posterolaterally directed mitral valve regurgitation.  6. Tricuspid valve regurgitation is moderate due to lack of central coaptation of the leaflets.  7. The aortic valve is tricuspid. There is mild calcification of the aortic valve. There is mild thickening of the aortic valve. Aortic valve regurgitation is trivial. Mild aortic valve sclerosis is present, with no evidence of aortic valve stenosis.  8. The inferior vena cava is dilated in size with <50% respiratory variability, suggesting right atrial pressure of 15 mmHg. Comparison(s): Compared to prior study on 07/07/18, there again is severely reduced LVEF with severely elevated filling pressures. FINDINGS  Left Ventricle: Left ventricular ejection fraction, by estimation, is 20 to 25%. The left ventricle has severely decreased function. The left ventricle demonstrates regional wall motion abnormalities. Definity contrast agent was given IV to delineate the left ventricular endocardial borders. The basal-to-apical anterior, basal-to-mid septal (infero and anteroseptal), mid-to-apical lateral (inferolateral and anterolateral) LV walls and apex appear akinetic. The left ventricular internal cavity size was normal in size. There is no left ventricular hypertrophy. Indeterminate diastolic filling due to E-A fusion. Right Ventricle: The right ventricular size is mildly enlarged. No increase in right ventricular  wall thickness. Right ventricular systolic function is severely reduced. There is mildly elevated pulmonary artery systolic pressure. The tricuspid regurgitant velocity is 2.54 m/s, and with an assumed right atrial pressure of 15 mmHg, the estimated right ventricular systolic pressure is 66.0 mmHg. Left Atrium: Left atrial size was severely dilated. Right Atrium: Right atrial size was severely dilated. Pericardium: There is no evidence of pericardial effusion. Mitral Valve: The mitral valve is grossly normal. There is mild thickening  of the mitral valve leaflet(s). There is mild calcification of the mitral valve leaflet(s). Mild mitral annular calcification. Moderate mitral valve regurgitation. Tricuspid Valve: The tricuspid valve is normal in structure. Tricuspid valve regurgitation is moderate. Aortic Valve: The aortic valve is tricuspid. There is mild calcification of the aortic valve. There is mild thickening of the aortic valve. Aortic valve regurgitation is trivial. Mild aortic valve sclerosis is present, with no evidence of aortic valve stenosis. Pulmonic Valve: The pulmonic valve was normal in structure. Pulmonic valve regurgitation is trivial. Aorta: The aortic root and ascending aorta are structurally normal, with no evidence of dilitation. Venous: The inferior vena cava is dilated in size with less than 50% respiratory variability, suggesting right atrial pressure of 15 mmHg. IAS/Shunts: No atrial level shunt detected by color flow Doppler.  LEFT VENTRICLE PLAX 2D LVIDd:         5.20 cm LVIDs:         4.50 cm LV PW:         1.20 cm LV IVS:        1.10 cm LVOT diam:     2.10 cm LV SV:         51 LV SV Index:   23 LVOT Area:     3.46 cm  LV Volumes (MOD) LV vol d, MOD A4C: 183.0 ml LV vol s, MOD A4C: 139.0 ml LV SV MOD A4C:     183.0 ml RIGHT VENTRICLE RV S prime:     5.11 cm/s TAPSE (M-mode): 1.1 cm LEFT ATRIUM              Index       RIGHT ATRIUM           Index LA diam:        5.50 cm  2.45 cm/m  RA  Area:     24.60 cm LA Vol (A2C):   79.6 ml  35.43 ml/m RA Volume:   73.30 ml  32.62 ml/m LA Vol (A4C):   107.0 ml 47.62 ml/m LA Biplane Vol: 93.4 ml  41.57 ml/m  AORTIC VALVE LVOT Vmax:   92.30 cm/s LVOT Vmean:  66.800 cm/s LVOT VTI:    0.147 m  AORTA Ao Root diam: 3.10 cm Ao Asc diam:  3.30 cm MITRAL VALVE                TRICUSPID VALVE MV Area (PHT): 4.80 cm     TR Peak grad:   25.8 mmHg MV Decel Time: 158 msec     TR Vmax:        254.00 cm/s MV E velocity: 132.00 cm/s MV A velocity: 41.00 cm/s   SHUNTS MV E/A ratio:  3.22         Systemic VTI:  0.15 m                             Systemic Diam: 2.10 cm Gwyndolyn Kaufman MD Electronically signed by Gwyndolyn Kaufman MD Signature Date/Time: 06/17/2020/12:50:54 PM    Final         Scheduled Meds: . atorvastatin  80 mg Oral Daily  . furosemide  60 mg Intravenous Once  . gabapentin  300 mg Oral TID  . olopatadine  1 drop Both Eyes Daily  . potassium chloride  40 mEq Oral BID  . sodium chloride flush  3 mL Intravenous Q12H  . tamsulosin  0.4 mg Oral Daily   Continuous  Infusions: . sodium chloride       LOS: 2 days       Hosie Poisson, MD [facility]   To contact the attending provider between 7A-7P or the covering provider during after hours 7P-7A, please log into the web site [email or web address] and access using universal [place] password for that web site. If you do not have the password, please call the hospital operator.  06/18/2020, 1:23 PM

## 2020-06-18 NOTE — Progress Notes (Signed)
   06/18/20 1200  OT Visit Information  Last OT Received On 06/18/20  Assistance Needed +1  History of Present Illness Pt is a 55 year old man admitted with abdominal pain and bloating, dizziness and bradycardia. PMH: advanced cardiomyopathy with EF of 20%, CAD, CKD III, morbid obesity, non Hodkins lymphoma, hx of substance abuse, bipolar disorder, HTN, gout, migraines.  Precautions  Precautions Fall  Precaution Comments reports hx of falls with L Hip giving out  Pain Assessment  Pain Assessment No/denies pain  Cognition  Arousal/Alertness Awake/alert  Behavior During Therapy WFL for tasks assessed/performed  Overall Cognitive Status Within Functional Limits for tasks assessed  ADL  General ADL Comments Educated pt in use of sock aide. Instructed in energy conservation strategies and provided written handout to reinforce.  OT - End of Session  Activity Tolerance Patient tolerated treatment well  Patient left in bed;with call bell/phone within reach;with family/visitor present  OT Assessment/Plan  OT Plan All goals met and education completed, patient discharged from OT services  OT Visit Diagnosis History of falling (Z91.81)  Follow Up Recommendations No OT follow up  OT Equipment None recommended by OT  AM-PAC OT "6 Clicks" Daily Activity Outcome Measure (Version 2)  Help from another person eating meals? 4  Help from another person taking care of personal grooming? 4  Help from another person toileting, which includes using toliet, bedpan, or urinal? 4  Help from another person bathing (including washing, rinsing, drying)? 4  Help from another person to put on and taking off regular upper body clothing? 4  Help from another person to put on and taking off regular lower body clothing? 4  6 Click Score 24  OT Goal Progression  Progress towards OT goals Goals met/education completed, patient discharged from OT  Acute Rehab OT Goals  Patient Stated Goal return home  OT Goal  Formulation With patient  Time For Goal Achievement 06/25/20  Potential to Achieve Goals Good  OT Time Calculation  OT Start Time (ACUTE ONLY) 1143  OT Stop Time (ACUTE ONLY) 1153  OT Time Calculation (min) 10 min  OT General Charges  $OT Visit 1 Visit  OT Treatments  $Self Care/Home Management  8-22 mins  Nestor Lewandowsky, OTR/L Acute Rehabilitation Services Pager: [phone] Office: [phone]

## 2020-06-18 NOTE — Evaluation (Signed)
Physical Therapy Evaluation Patient Details Name: Cody Guzman MRN: [id number] DOB: 06-15-1965 Today's Date: 06/18/2020   History of Present Illness  Pt is a 55 year old man admitted with abdominal pain and bloating, dizziness and bradycardia. PMH: advanced cardiomyopathy with EF of 20%, CAD, CKD III, morbid obesity, non Hodkins lymphoma, hx of substance abuse, bipolar disorder, HTN, gout, migraines.  Clinical Impression  Pt admitted with above diagnosis.  He demonstrates safe transfers, gait, and balance.  Pt ambulated at normal to fast speed with VSS.  Pt has intermittent support at home and necessary DME.  No further acute or outpt therapy needs at this time.     Follow Up Recommendations No PT follow up    Equipment Recommendations  None recommended by PT    Recommendations for Other Services       Precautions / Restrictions Precautions Precautions: Fall Precaution Comments: reports hx of falls with L Hip giving out      Mobility  Bed Mobility Overal bed mobility: Modified Independent                  Transfers Overall transfer level: Modified independent Equipment used: None                Ambulation/Gait Ambulation/Gait assistance: Supervision Gait Distance (Feet): 150 Feet Assistive device: None Gait Pattern/deviations: WFL(Within Functional Limits) Gait velocity: fast   General Gait Details: steady gait; no LOB - denies dizziness, reports bloated and some SHOB - pt reports has not had his Torsamide and he alerted RN  Stairs            Wheelchair Mobility    Modified Rankin (Stroke Patients Only)       Balance Overall balance assessment: Independent   Sitting balance-Leahy Scale: Normal       Standing balance-Leahy Scale: Good Standing balance comment: no balance deficits with gait                             Pertinent Vitals/Pain Pain Assessment: No/denies pain    Home Living Family/patient expects to be  discharged to:: Private residence Living Arrangements: Spouse/significant other Available Help at Discharge: Family;Available PRN/intermittently Type of Home: Other(Comment) (condo) Home Access: Stairs to enter Entrance Stairs-Rails: Right;Left;Can reach both Entrance Stairs-Number of Steps: 7 Home Layout: One level Home Equipment: Cane - single point;Shower seat - built in      Prior Function Level of Independence: Independent with assistive device(s)         Comments: walks with a cane     Hand Dominance   Dominant Hand: Right    Extremity/Trunk Assessment   Upper Extremity Assessment Upper Extremity Assessment: Overall WFL for tasks assessed    Lower Extremity Assessment Lower Extremity Assessment: Overall WFL for tasks assessed    Cervical / Trunk Assessment Cervical / Trunk Assessment: Normal Cervical / Trunk Exceptions: obesity  Communication   Communication: No difficulties  Cognition Arousal/Alertness: Awake/alert Behavior During Therapy: WFL for tasks assessed/performed Overall Cognitive Status: Within Functional Limits for tasks assessed                                        General Comments General comments (skin integrity, edema, etc.): on RA; O2 sats 99%; did have DOE of 2/4 with activity; HR 90's with activity    Exercises  Assessment/Plan    PT Assessment Patent does not need any further PT services  PT Problem List         PT Treatment Interventions      PT Goals (Current goals can be found in the Care Plan section)  Acute Rehab PT Goals Patient Stated Goal: return home PT Goal Formulation: All assessment and education complete, DC therapy    Frequency     Barriers to discharge        Co-evaluation               AM-PAC PT "6 Clicks" Mobility  Outcome Measure Help needed turning from your back to your side while in a flat bed without using bedrails?: None Help needed moving from lying on your back to  sitting on the side of a flat bed without using bedrails?: None Help needed moving to and from a bed to a chair (including a wheelchair)?: None Help needed standing up from a chair using your arms (e.g., wheelchair or bedside chair)?: None Help needed to walk in hospital room?: None Help needed climbing 3-5 steps with a railing? : A Little 6 Click Score: 23    End of Session   Activity Tolerance: Patient tolerated treatment well Patient left: in bed;with call bell/phone within reach;with family/visitor present Nurse Communication: Mobility status      Time: 1040-1054 PT Time Calculation (min) (ACUTE ONLY): 14 min   Charges:   PT Evaluation $PT Eval Low Complexity: 1 Low          Cody Guzman, PT Acute Rehab Services Pager [phone] [facility] [phone]    Cody Guzman 06/18/2020, 11:00 AM

## 2020-06-18 NOTE — Evaluation (Signed)
Occupational Therapy Evaluation Patient Details Name: Cody Guzman MRN: [id number] DOB: 07/25/1965 Today's Date: 06/18/2020    History of Present Illness Pt is a 55 year old man admitted with abdominal pain and bloating, dizziness and bradycardia. PMH: advanced cardiomyopathy with EF of 20%, CAD, CKD III, morbid obesity, non Hodkins lymphoma, hx of substance abuse, bipolar disorder, HTN, gout, migraines.   Clinical Impression   Pt was independent with a cane prior to admission. Drives and works. Pt presents with decreased activity tolerance and fatigues quickly with donning socks. Will follow acutely to educate in use of AE and energy conservation strategies.     Follow Up Recommendations  No OT follow up    Equipment Recommendations  None recommended by OT    Recommendations for Other Services       Precautions / Restrictions Precautions Precautions: Fall Precaution Comments: reports hx of falls with L Hip giving out      Mobility Bed Mobility Overal bed mobility: Modified Independent                  Transfers Overall transfer level: Modified independent Equipment used: None                  Balance Overall balance assessment: Mild deficits observed, not formally tested                                         ADL either performed or assessed with clinical judgement   ADL Overall ADL's : Modified independent                                       General ADL Comments: difficulty with socks, will educate in use of sock aide and energy conservation     Vision Baseline Vision/History: No visual deficits       Perception     Praxis      Pertinent Vitals/Pain Pain Assessment: No/denies pain     Hand Dominance Right   Extremity/Trunk Assessment Upper Extremity Assessment Upper Extremity Assessment: Overall WFL for tasks assessed   Lower Extremity Assessment Lower Extremity Assessment: Defer to PT  evaluation   Cervical / Trunk Assessment Cervical / Trunk Assessment: Other exceptions Cervical / Trunk Exceptions: obesity   Communication Communication Communication: No difficulties   Cognition Arousal/Alertness: Awake/alert Behavior During Therapy: WFL for tasks assessed/performed Overall Cognitive Status: Within Functional Limits for tasks assessed                                     General Comments       Exercises     Shoulder Instructions      Home Living Family/patient expects to be discharged to:: Private residence Living Arrangements: Spouse/significant other Available Help at Discharge: Family;Available PRN/intermittently Type of Home: Other(Comment) (condo) Home Access: Stairs to enter Entrance Stairs-Number of Steps: 7 Entrance Stairs-Rails: Right;Left;Can reach both Home Layout: One level     Bathroom Shower/Tub: Occupational psychologist: Standard     Home Equipment: Cane - single point;Shower seat - built in          Prior Functioning/Environment Level of Independence: Independent with assistive device(s)        Comments: walks  with a cane        OT Problem List: Decreased activity tolerance      OT Treatment/Interventions: Energy conservation;DME and/or AE instruction    OT Goals(Current goals can be found in the care plan section) Acute Rehab OT Goals Patient Stated Goal: return home OT Goal Formulation: With patient Time For Goal Achievement: 06/25/20 Potential to Achieve Goals: Good ADL Goals Additional ADL Goal #1: Pt return demonstration in use of sock aide. Additional ADL Goal #2: Pt will be knowledgeable in energy conservation strategies as instructed.  OT Frequency: Min 2X/week   Barriers to D/C:            Co-evaluation              AM-PAC OT "6 Clicks" Daily Activity     Outcome Measure Help from another person eating meals?: None Help from another person taking care of personal  grooming?: None Help from another person toileting, which includes using toliet, bedpan, or urinal?: None Help from another person bathing (including washing, rinsing, drying)?: None Help from another person to put on and taking off regular upper body clothing?: None Help from another person to put on and taking off regular lower body clothing?: None 6 Click Score: 24   End of Session    Activity Tolerance: Patient tolerated treatment well Patient left: in bed;with call bell/phone within reach;with family/visitor present  OT Visit Diagnosis: Other (comment) (decreased activity tolerance)                Time: 2778-2423 OT Time Calculation (min): 20 min Charges:  OT General Charges $OT Visit: 1 Visit OT Evaluation $OT Eval Low Complexity: 1 Low  Cody Guzman, OTR/L Acute Rehabilitation Services Pager: [phone] Office: [phone]  Malka So 06/18/2020, 10:03 AM

## 2020-06-18 NOTE — Progress Notes (Addendum)
Progress Note  Patient Name: Cody Guzman Date of Encounter: 06/18/2020  [facility] Cardiologist: Elouise Munroe, MD   Subjective   No chest pain.  No SOB with walking in hall with PT  Service dog in bed with pt.   Inpatient Medications    Scheduled Meds: . atorvastatin  80 mg Oral Daily  . gabapentin  300 mg Oral TID  . heparin  5,000 Units Subcutaneous Q8H  . olopatadine  1 drop Both Eyes Daily  . sodium chloride flush  3 mL Intravenous Q12H  . tamsulosin  0.4 mg Oral Daily   Continuous Infusions: . sodium chloride     PRN Meds: sodium chloride, acetaminophen, albuterol, HYDROcodone-acetaminophen, ipratropium-albuterol, morphine injection, nitroGLYCERIN, ondansetron (ZOFRAN) IV, polyethylene glycol, senna, sodium chloride flush, SUMAtriptan   Vital Signs    Vitals:   06/17/20 0223 06/17/20 0330 06/17/20 1430 06/17/20 2000  BP: 103/88  101/78 94/75  Pulse: 92     Resp: '18  18 18  ' Temp: 98.3 F (36.8 C)  98.5 F (36.9 C) 98.5 F (36.9 C)  TempSrc: Oral Oral Oral Oral  SpO2: 95%     Weight: 107.9 kg     Height: '5\' 10"'  (1.778 m)       Intake/Output Summary (Last 24 hours) at 06/18/2020 1021 Last data filed at 06/18/2020 0600 Gross per 24 hour  Intake 1200 ml  Output 500 ml  Net 700 ml   Last 3 Weights 06/17/2020 06/14/2020 03/21/2020  Weight (lbs) 237 lb 14 oz 236 lb 238 lb 12.8 oz  Weight (kg) 107.9 kg 107.049 kg 108.319 kg      Telemetry    Atrial flutter, rate controlled with ventricular rhythm  - Personally Reviewed  ECG    No new - Personally Reviewed  Physical Exam   GEN: No acute distress.   Neck: No JVD Cardiac: RRR, no murmurs, rubs, or gallops.  Respiratory: Clear to auscultation bilaterally. GI: Soft, nontender, non-distended  MS: No edema; No deformity. Neuro:  Nonfocal  Psych: Normal affect   Labs    High Sensitivity Troponin:   Recent Labs  Lab 06/16/20 1628 06/16/20 1810  TROPONINIHS 29* 17       Chemistry Recent Labs  Lab 06/16/20 1628 06/16/20 2334 06/17/20 0232 06/18/20 0132  NA 142  --  140 139  K 3.0*  --  3.4* 3.3*  CL 110  --  104 104  CO2 20*  --  25 24  GLUCOSE 87  --  116* 97  BUN 18  --  16 13  CREATININE 1.44* 1.55* 1.45* 1.21  CALCIUM 8.0*  --  8.9 9.0  PROT 5.9*  --   --   --   ALBUMIN 3.3*  --   --   --   AST 25  --   --   --   ALT 17  --   --   --   ALKPHOS 70  --   --   --   BILITOT 1.1  --   --   --   GFRNONAA 57* 52* 57* >60  ANIONGAP 12  --  11 11     Hematology Recent Labs  Lab 06/16/20 1628 06/16/20 2334  WBC 7.4 7.2  RBC 3.94* 4.52  HGB 11.6* 13.4  HCT 37.3* 39.8  MCV 94.7 88.1  MCH 29.4 29.6  MCHC 31.1 33.7  RDW 15.7* 15.7*  PLT 204 228    BNP Recent Labs  Lab 06/16/20 1628  BNP 361.5*     DDimer No results for input(s): DDIMER in the last 168 hours.   Radiology    DG Chest 2 View  Result Date: 06/16/2020 CLINICAL DATA:  Dizzy this and bradycardia EXAM: CHEST - 2 VIEW COMPARISON:  March 23, 2019 FINDINGS: There is no edema or airspace opacity. There is cardiomegaly with pulmonary venous hypertension. No adenopathy. No bone lesions. IMPRESSION: Cardiomegaly with pulmonary vascular congestion.  Lungs clear. Electronically Signed   By: Lowella Grip III M.D.   On: 06/16/2020 17:18   MR BRAIN WO CONTRAST  Result Date: 06/17/2020 CLINICAL DATA:  55 year old male with neurologic deficit. Symptomatic bradycardia. EXAM: MRI HEAD WITHOUT CONTRAST TECHNIQUE: Multiplanar, multiecho pulse sequences of the brain and surrounding structures were obtained without intravenous contrast. COMPARISON:  None. FINDINGS: Brain: No restricted diffusion to suggest acute infarction. No midline shift, mass effect, evidence of mass lesion, ventriculomegaly, extra-axial collection or acute intracranial hemorrhage. Cervicomedullary junction and pituitary are within normal limits. Cavum septum pellucidum, normal variant. Patchy chronic  encephalomalacia in both anterior frontal lobes, left superior frontal and right inferior frontal gyri. No definite chronic cerebral blood products. Patchy additional nonspecific bilateral cerebral white matter T2 and FLAIR hyperintensity which is more symmetric. Mild T2 heterogeneity in the bilateral basal ganglia more resemble small-vessel disease than perivascular spaces. Thalami, and brainstem appear negative. Questionable small chronic cerebellar infarcts medially on series 10, image 7. Vascular: Major intracranial vascular flow voids are preserved. Mild generalized intracranial artery tortuosity. Skull and upper cervical spine: Negative visible cervical spine. Visualized bone marrow signal is within normal limits. Sinuses/Orbits: Negative orbits. Trace paranasal sinus mucosal thickening. Other: Mastoids are clear. Visible internal auditory structures appear normal. Scalp and face soft tissues appear negative. IMPRESSION: 1. No acute intracranial abnormality. 2. Patchy chronic encephalomalacia the anterior frontal lobes. Though nonspecific this pattern is frequently the sequelae of trauma. 3. Superimposed signal heterogeneity in the cerebral white matter and basal ganglia, most commonly due to chronic small vessel disease. Electronically Signed   By: Genevie Ann M.D.   On: 06/17/2020 05:58   CT ABDOMEN PELVIS W CONTRAST  Result Date: 06/16/2020 CLINICAL DATA:  Acute abdominal pain and distension.  Nausea. EXAM: CT ABDOMEN AND PELVIS WITH CONTRAST TECHNIQUE: Multidetector CT imaging of the abdomen and pelvis was performed using the standard protocol following bolus administration of intravenous contrast. CONTRAST:  181m OMNIPAQUE IOHEXOL 300 MG/ML  SOLN COMPARISON:  CT 07/06/2018 FINDINGS: Lower chest: Upper normal heart size with coronary artery calcifications. No acute airspace disease or pleural fluid. Hepatobiliary: No focal liver abnormality is seen. Borderline decreased hepatic density suggesting  steatosis. Prominence of the IVC and hepatic veins. Gallbladder partially distended. No calcified gallstone. No pericholecystic fat stranding. There is no biliary dilatation. Pancreas: Unremarkable. No pancreatic ductal dilatation or surrounding inflammatory changes. Spleen: Normal in size without focal abnormality. Adrenals/Urinary Tract: Stable low-density thickening of the left adrenal gland. The previous right adrenal nodule is less nodular on the current exam, no increase in size. No hydronephrosis or perinephric edema. Minimal cortical scarring in the lower left kidney. Indeterminate 12 mm partially exophytic lesion from the lower right kidney, series 3, image 4, possibly increased in size from prior. Additional small subcentimeter renal cortical hypodensities are too small to characterize. No visualized renal calculi. Urinary bladder is unremarkable. Stomach/Bowel: Mild distal esophageal wall thickening. Fluid/ingested material distends the stomach. No definite gastric wall thickening. Duodenal diverticulum arising from the third and fourth portion without inflammation. Normal positioning of the ligament  of Treitz. No small bowel obstruction or inflammation. Mild fecalization of distal small bowel contents. Normal appendix. Majority of the colon is decompressed. No colonic wall thickening or inflammation. Vascular/Lymphatic: Mild aortic and moderate bi-iliac atherosclerosis. No aneurysm. Patent portal vein. Right scrotal varicoceles which have progressed from prior exam. No retroperitoneal obstruction. No enlarged lymph nodes in the abdomen or pelvis. Reproductive: Prostate is unremarkable. Other: No ascites or free air. Small fat containing umbilical hernia. Musculoskeletal: There are no acute or suspicious osseous abnormalities. Stable peripherally sclerotic lucency in the intertrochanteric right femur, unchanged. Mild lumbar spondylosis. IMPRESSION: 1. Mild distal esophageal wall thickening, can be seen  with reflux or esophagitis. 2. Findings suggesting elevated right heart pressure with dilated hepatic veins and IVC. Right scrotal varicoceles which have progressed from prior exam, possibly due to right heart failure. No retroperitoneal obstruction. 3. Indeterminate 12 mm lesion in the right kidney, possibly increased in size from 2020 exam. Recommend non emergent MRI characterization on an elective basis. 4. Previous right adrenal nodule appears less nodular on the current exam, no progression or increase in size. Aortic Atherosclerosis (ICD10-I70.0). Electronically Signed   By: Keith Rake M.D.   On: 06/16/2020 19:12   ECHOCARDIOGRAM COMPLETE  Result Date: 06/17/2020    ECHOCARDIOGRAM REPORT   Patient Name:   PRINCESTON BLIZZARD Date of Exam: 06/17/2020 Medical Rec #:  [id number]           Height:       70.0 in Accession #:    [id number]          Weight:       237.9 lb Date of Birth:  April 09, 1966            BSA:          2.247 m Patient Age:    55 years            BP:           103/88 mmHg Patient Gender: M                   HR:           95 bpm. Exam Location:  Inpatient Procedure: 2D Echo, Color Doppler, Cardiac Doppler and Intracardiac            Opacification Agent Indications:    G40.10 Acute systolic (congestive) heart failure  History:        Patient has prior history of Echocardiogram examinations, most                 recent 07/09/2018. CHF, CAD, Pulmonary HTN; Risk                 Factors:Dyslipidemia and Sleep Apnea.  Sonographer:    Raquel Sarna Senior RDCS Referring Phys: [place]  1. Left ventricular ejection fraction, by estimation, is 20 to 25%. The left ventricle has severely decreased function. The basal-to-apical anterior, basal-to-mid septal (infero and anteroseptal), mid-to-apical lateral (infero and anterolateral) LV walls and apex appear akinetic. Indeterminate diastolic filling due to E-A fusion.  2. Right ventricular systolic function is severely reduced. The right  ventricular size is mildly enlarged. There is mildly elevated pulmonary artery systolic pressure.  3. Left atrial size was severely dilated.  4. Right atrial size was severely dilated.  5. The mitral valve is grossly normal. At least moderate, eccentric, posterolaterally directed mitral valve regurgitation.  6. Tricuspid valve regurgitation is moderate due to lack of central coaptation of the  leaflets.  7. The aortic valve is tricuspid. There is mild calcification of the aortic valve. There is mild thickening of the aortic valve. Aortic valve regurgitation is trivial. Mild aortic valve sclerosis is present, with no evidence of aortic valve stenosis.  8. The inferior vena cava is dilated in size with <50% respiratory variability, suggesting right atrial pressure of 15 mmHg. Comparison(s): Compared to prior study on 07/07/18, there again is severely reduced LVEF with severely elevated filling pressures. FINDINGS  Left Ventricle: Left ventricular ejection fraction, by estimation, is 20 to 25%. The left ventricle has severely decreased function. The left ventricle demonstrates regional wall motion abnormalities. Definity contrast agent was given IV to delineate the left ventricular endocardial borders. The basal-to-apical anterior, basal-to-mid septal (infero and anteroseptal), mid-to-apical lateral (inferolateral and anterolateral) LV walls and apex appear akinetic. The left ventricular internal cavity size was normal in size. There is no left ventricular hypertrophy. Indeterminate diastolic filling due to E-A fusion. Right Ventricle: The right ventricular size is mildly enlarged. No increase in right ventricular wall thickness. Right ventricular systolic function is severely reduced. There is mildly elevated pulmonary artery systolic pressure. The tricuspid regurgitant velocity is 2.54 m/s, and with an assumed right atrial pressure of 15 mmHg, the estimated right ventricular systolic pressure is 37.3 mmHg. Left Atrium:  Left atrial size was severely dilated. Right Atrium: Right atrial size was severely dilated. Pericardium: There is no evidence of pericardial effusion. Mitral Valve: The mitral valve is grossly normal. There is mild thickening of the mitral valve leaflet(s). There is mild calcification of the mitral valve leaflet(s). Mild mitral annular calcification. Moderate mitral valve regurgitation. Tricuspid Valve: The tricuspid valve is normal in structure. Tricuspid valve regurgitation is moderate. Aortic Valve: The aortic valve is tricuspid. There is mild calcification of the aortic valve. There is mild thickening of the aortic valve. Aortic valve regurgitation is trivial. Mild aortic valve sclerosis is present, with no evidence of aortic valve stenosis. Pulmonic Valve: The pulmonic valve was normal in structure. Pulmonic valve regurgitation is trivial. Aorta: The aortic root and ascending aorta are structurally normal, with no evidence of dilitation. Venous: The inferior vena cava is dilated in size with less than 50% respiratory variability, suggesting right atrial pressure of 15 mmHg. IAS/Shunts: No atrial level shunt detected by color flow Doppler.  LEFT VENTRICLE PLAX 2D LVIDd:         5.20 cm LVIDs:         4.50 cm LV PW:         1.20 cm LV IVS:        1.10 cm LVOT diam:     2.10 cm LV SV:         51 LV SV Index:   23 LVOT Area:     3.46 cm  LV Volumes (MOD) LV vol d, MOD A4C: 183.0 ml LV vol s, MOD A4C: 139.0 ml LV SV MOD A4C:     183.0 ml RIGHT VENTRICLE RV S prime:     5.11 cm/s TAPSE (M-mode): 1.1 cm LEFT ATRIUM              Index       RIGHT ATRIUM           Index LA diam:        5.50 cm  2.45 cm/m  RA Area:     24.60 cm LA Vol (A2C):   79.6 ml  35.43 ml/m RA Volume:   73.30 ml  32.62 ml/m  LA Vol (A4C):   107.0 ml 47.62 ml/m LA Biplane Vol: 93.4 ml  41.57 ml/m  AORTIC VALVE LVOT Vmax:   92.30 cm/s LVOT Vmean:  66.800 cm/s LVOT VTI:    0.147 m  AORTA Ao Root diam: 3.10 cm Ao Asc diam:  3.30 cm MITRAL VALVE                 TRICUSPID VALVE MV Area (PHT): 4.80 cm     TR Peak grad:   25.8 mmHg MV Decel Time: 158 msec     TR Vmax:        254.00 cm/s MV E velocity: 132.00 cm/s MV A velocity: 41.00 cm/s   SHUNTS MV E/A ratio:  3.22         Systemic VTI:  0.15 m                             Systemic Diam: 2.10 cm Gwyndolyn Kaufman MD Electronically signed by Gwyndolyn Kaufman MD Signature Date/Time: 06/17/2020/12:50:54 PM    Final     Cardiac Studies   Echo 06/17/20  IMPRESSIONS    1. Left ventricular ejection fraction, by estimation, is 20 to 25%. The  left ventricle has severely decreased function. The basal-to-apical  anterior, basal-to-mid septal (infero and anteroseptal), mid-to-apical  lateral (infero and anterolateral) LV  walls and apex appear akinetic. Indeterminate diastolic filling due to E-A  fusion.  2. Right ventricular systolic function is severely reduced. The right  ventricular size is mildly enlarged. There is mildly elevated pulmonary  artery systolic pressure.  3. Left atrial size was severely dilated.  4. Right atrial size was severely dilated.  5. The mitral valve is grossly normal. At least moderate, eccentric,  posterolaterally directed mitral valve regurgitation.  6. Tricuspid valve regurgitation is moderate due to lack of central  coaptation of the leaflets.  7. The aortic valve is tricuspid. There is mild calcification of the  aortic valve. There is mild thickening of the aortic valve. Aortic valve  regurgitation is trivial. Mild aortic valve sclerosis is present, with no  evidence of aortic valve stenosis.  8. The inferior vena cava is dilated in size with <50% respiratory  variability, suggesting right atrial pressure of 15 mmHg.   Comparison(s): Compared to prior study on 07/07/18, there again is severely  reduced LVEF with severely elevated filling pressures.   FINDINGS  Left Ventricle: Left ventricular ejection fraction, by estimation, is 20  to 25%. The  left ventricle has severely decreased function. The left  ventricle demonstrates regional wall motion abnormalities. Definity  contrast agent was given IV to delineate  the left ventricular endocardial borders. The basal-to-apical anterior,  basal-to-mid septal (infero and anteroseptal), mid-to-apical lateral  (inferolateral and anterolateral) LV walls and apex appear akinetic. The  left ventricular internal cavity size  was normal in size. There is no left ventricular hypertrophy.  Indeterminate diastolic filling due to E-A fusion.   Right Ventricle: The right ventricular size is mildly enlarged. No  increase in right ventricular wall thickness. Right ventricular systolic  function is severely reduced. There is mildly elevated pulmonary artery  systolic pressure. The tricuspid  regurgitant velocity is 2.54 m/s, and with an assumed right atrial  pressure of 15 mmHg, the estimated right ventricular systolic pressure is  38.9 mmHg.   Left Atrium: Left atrial size was severely dilated.   Right Atrium: Right atrial size was severely dilated.   Pericardium: There  is no evidence of pericardial effusion.   Mitral Valve: The mitral valve is grossly normal. There is mild thickening  of the mitral valve leaflet(s). There is mild calcification of the mitral  valve leaflet(s). Mild mitral annular calcification. Moderate mitral valve  regurgitation.   Tricuspid Valve: The tricuspid valve is normal in structure. Tricuspid  valve regurgitation is moderate.   Aortic Valve: The aortic valve is tricuspid. There is mild calcification  of the aortic valve. There is mild thickening of the aortic valve. Aortic  valve regurgitation is trivial. Mild aortic valve sclerosis is present,  with no evidence of aortic valve  stenosis.   Pulmonic Valve: The pulmonic valve was normal in structure. Pulmonic valve  regurgitation is trivial.   Aorta: The aortic root and ascending aorta are structurally normal,  with  no evidence of dilitation.   Venous: The inferior vena cava is dilated in size with less than 50%  respiratory variability, suggesting right atrial pressure of 15 mmHg.   IAS/Shunts: No atrial level shunt detected by color flow Doppler.      Patient Profile     55 y.o. male with CAD s/p MI x2 and PCI (2014, [place]), most recent MI 2016 ([place]), NHL s/p chemotx, crack cocaine use, tobacco use, and HFrEF/ICM admitted 06/16/20 with sudden dizziness, Nausea, diarrhea abd pain, blurred vision, migraines, bradycardia, weakness and fall.      Assessment & Plan    Atrial flutter with variable conduction without RVR.  HR to mid 30s briefly in ER per Fellow note --continues in a flutter with slow flutter rates and controlled ventricular rates-- was slow in ER with 7 flutter waves to ventricular beat - coreg stopped but will need to resume.  --occ ventricular rhythm  --Echo with both rt and Lt atrium severely dilated --Given his reportedly severely reduced EF, he would benefit from AV synchrony and will probably need DCCV  --CHAD2S2VASc of 3, not on anticoagulation pt at times is homeless but currently has place to live.   (MRI of head with no acute abnormality)   --may benefit from TEE DCCV prior to discharge once his BP tolerates meds.   Chronic combined biventricular HF and diastolic HF/ICM -has not kept follow up appts  BNP 361  -outpt meds was on bidil, spironolactone  Not on ACE inhibitor/ARB/ARN I at the moment, I suspect this may be due to renal function.  Not currently on SGLT2 inhibitor, I would consider starting  -moderate MR, moderate TR  -EF 20-25% , RV dysfunction has increased.  -meds on hold due to hypotension   CAD with angina (prior stents, 4 in 2014 ([place]) and cath in [place] ([place]) --troponin 29 and 17 has had outpt chest pain --mention in past of cardiac cath  --he denies angina, chronic back and hip pain  (his PCP notes mention chest pain and need to see cardiology)    HTN currently low 94/75 --BP meds held   OSA has not had sleep study --to see pulmonary for eval  Bipolar 1 disorder per IM  Hypokalemia with K+ 3.3 rec'd 40 meq yesterday per IM  hypomag 1.3 on the 12th  Replaced will recheck       For questions or updates, please contact [place] Please consult [email or web address] for contact info under        Signed, Cecilie Kicks, NP  06/18/2020, 10:21 AM    Patient seen and examined with Cecilie Kicks NP.  Agree as above, with the following exceptions and  changes as noted below. Main concern for admission was diarrhea from concern of food poisoning. Gen: NAD, CV: RRR, no murmurs, Lungs: clear, Abd: soft, Extrem: Warm,no edema, Neuro/Psych: alert and oriented x 3, normal mood and affect. All available labs, radiology testing, previous records reviewed.   Severe biventricular failure, with no significant change over serial echocardiogram images.  I have compared these echo side-by-side, and RV function appears similar, moderate to severely decreased.  LVEF remains severely decreased.  He had symptoms of low output at our clinic visit a few months ago with NYHA class III symptoms but he failed to follow-up for repeat echocardiogram and close medication titration.  He is now hospitalized with diarrhea from possible food poisoning and severe headache accompanied by dizziness and visual blurriness suggestive of presyncope and likely poor perfusion.  I had intended to arrange right and left heart catheterization with advanced heart failure consultation as an outpatient but the patient was unable to come back for follow-up.  I would like to complete this now particularly because he is in atrial flutter which is a new diagnosis.  I would like to TEE cardiovert him but it may be very challenging to use propofol when his blood pressure is hypotensive and at times in the 80s and 90s.  Reevaluation of his hemodynamic status with at least right heart catheterization  is indicated.  I would like to get advanced heart failure team involved at this point.  I have explained this to him in detail as well as his wife who is at the bedside and who I have not met before.  I have stressed the critical nature of his cardiovascular status.  His diuretics have been held while in hospital due to hypotension but he feels congested and we will start with IV Lasix 60 mg today.  Monitor for response.  Suspect he may have some degree of preload dependence given RV failure.   Elouise Munroe, MD 06/18/20 12:23 PM

## 2020-06-19 ENCOUNTER — Encounter (HOSPITAL_COMMUNITY)
Admission: EM | Disposition: A | Discharge status: Home / Self Care | Payer: Self-pay | Source: Home / Self Care | Attending: Internal Medicine

## 2020-06-19 DIAGNOSIS — E876 Hypokalemia: Secondary | ICD-10-CM | POA: Diagnosis not present

## 2020-06-19 DIAGNOSIS — N183 Chronic kidney disease, stage 3 unspecified: Secondary | ICD-10-CM

## 2020-06-19 DIAGNOSIS — I25119 Atherosclerotic heart disease of native coronary artery with unspecified angina pectoris: Secondary | ICD-10-CM | POA: Diagnosis not present

## 2020-06-19 DIAGNOSIS — R001 Bradycardia, unspecified: Secondary | ICD-10-CM | POA: Diagnosis not present

## 2020-06-19 DIAGNOSIS — I5020 Unspecified systolic (congestive) heart failure: Secondary | ICD-10-CM | POA: Diagnosis not present

## 2020-06-19 DIAGNOSIS — I4892 Unspecified atrial flutter: Secondary | ICD-10-CM | POA: Diagnosis not present

## 2020-06-19 DIAGNOSIS — F319 Bipolar disorder, unspecified: Secondary | ICD-10-CM | POA: Diagnosis not present

## 2020-06-19 DIAGNOSIS — I5082 Biventricular heart failure: Secondary | ICD-10-CM | POA: Diagnosis not present

## 2020-06-19 LAB — BASIC METABOLIC PANEL
Anion gap: 11 (ref 5–15)
BUN: 9 mg/dL (ref 6–20)
CO2: 25 mmol/L (ref 22–32)
Calcium: 9.8 mg/dL (ref 8.9–10.3)
Chloride: 101 mmol/L (ref 98–111)
Creatinine, Ser: 1.36 mg/dL — ABNORMAL HIGH (ref 0.61–1.24)
GFR, Estimated: >60 mL/min (ref >60)
Glucose, Bld: 112 mg/dL — ABNORMAL HIGH (ref 70–99)
Potassium: 4 mmol/L (ref 3.5–5.1)
Sodium: 137 mmol/L (ref 135–145)

## 2020-06-19 LAB — CBC
HCT: 40 % (ref 39.0–52.0)
Hemoglobin: 13.9 g/dL (ref 13.0–17.0)
MCH: 30.1 pg (ref 26.0–34.0)
MCHC: 34.8 g/dL (ref 30.0–36.0)
MCV: 86.6 fL (ref 80.0–100.0)
Platelets: 222 10*3/uL (ref 150–400)
RBC: 4.62 MIL/uL (ref 4.22–5.81)
RDW: 15.8 % — ABNORMAL HIGH (ref 11.5–15.5)
WBC: 7.2 10*3/uL (ref 4.0–10.5)
nRBC: 0 % (ref 0.0–0.2)

## 2020-06-19 SURGERY — RIGHT/LEFT HEART CATH AND CORONARY ANGIOGRAPHY
Anesthesia: LOCAL

## 2020-06-19 MED ORDER — SENNOSIDES-DOCUSATE SODIUM 8.6-50 MG PO TABS
2.0000 | ORAL_TABLET | Freq: Two times a day (BID) | ORAL | Status: DC
  Administered 2020-06-19 – 2020-06-20 (×3): 2 via ORAL
  Filled 2020-06-19 (×5): qty 2

## 2020-06-19 MED ORDER — SODIUM CHLORIDE 0.9 % IV SOLN: INTRAVENOUS | Status: DC

## 2020-06-19 NOTE — Progress Notes (Signed)
CARDIAC REHAB PHASE I   PRE:  Rate/Rhythm: 103 aflutter    BP: sitting 99/83    SaO2: 97 RA  MODE:  Ambulation: 430 ft   POST:  Rate/Rhythm: 129 brief, 109 aflutter    BP: sitting 101/70     SaO2: 98 RA  Pt tolerated well. Some fatigue toward end but overall feels well. Began discussing some education. Pt sts he has the HF booklet at home and doesn't need another one. He could not state sodium amount and does not have a scale. Good discussion of benefits of daily wts, which he agreed. I gave him low sodium diets to review. He can walk independently now. Will f/u tomorrow. [place], ACSM 06/19/2020 2:07 PM

## 2020-06-19 NOTE — Progress Notes (Signed)
PROGRESS NOTE    Cody Guzman  BCW:[id number] DOB: [birth year] DOA: 06/16/2020 PCP: [name], MD    Chief Complaint  Patient presents with   Bradycardia    Brief Narrative:  Cody Guzman is a 55 y.o. male with medical history significant of advanced cardiomyopathy with EF last reported of about 20%, coronary artery disease, chronic kidney disease stage III, morbid obesity, non-Hodgkin's lymphoma as a teenager, prior history of substance abuse, bipolar disorder, essential hypertension, gout,  presented to the ER with abdominal pain dizziness and bloating. Pt reports his dizziness , bloating, headache resolved.   Assessment & Plan:   Principal Problem:   Symptomatic bradycardia Active Problems:   Chronic kidney disease (CKD), stage II (mild)   Hypokalemia   Bipolar 1 disorder (HCC)   Coronary artery disease involving native coronary artery of native heart with angina pectoris (HCC)   NHL (non-Hodgkin's lymphoma) (HCC)   CAD / Ischemia cardiomyopathy:  Pt currently denies any chest pain.  Restart aspirin as cardiology recommendations.   Chronic systolic heart failure:  Poor follow up with cadiology.  Currently all his mets are on hold due to borderline low bp parameters.  He appears to be compensated. Continue with strict intake and output.  Continue with IV lasix 60 mg BID, spironolactone .  Echocardiogram ordered showed LVEF of 20% with RV dysfunction .   Symptomatic bradycardia/Atrial flutter with variable conduction  Resolved with stopping coreg. Not on anti coagulation due to ? Non compliance.  CHA2DS2-VASc score is 3, defer anticoagulation to cardiology. Cardiology consulted for recommendations.  Echocardiogram showed right and left atrium severely dilated.  Cardiology recommends TEE DCCV , scheduled for tomorrow after 3 doses of eliquis.    OSA.  Will need outpatient referral for sleep study.   Essential Hypertension bp parameters are low  borderline,     Bipolar Disorder  Recommend outpatient follow up with psychiatry.  Hypokalemia/  hypomagnesemia:  Replaced.   Body mass index is 32.86 kg/m. Obesity:  Outpatient follow up for weight loss and life style modifications.    NHL   Gout:  Stable.  Stage 2 CKD: Creatinine stable and better than baseline.   Hyperlipidemia:  Continue with liiptor.   Dysuria:  He was recently started antibiotics following which he has nausea, vomiting and diarrhea,w hich have resolved.repeat UA does not show any infection.  CT abd and pelvis shows Mild distal esophageal wall thickening, can be seen with reflux or esophagitis. Findings suggesting elevated right heart pressure with dilated hepatic veins and IVC. Right scrotal varicoceles which have progressed from prior exam, possibly due to right heart failure. No retroperitoneal obstruction. Indeterminate 12 mm lesion in the right kidney, possibly increased in size from 2020 exam. Recommend non emergent MRI characterization on an elective basis.    LLE weakness, dizziness:  MRI brain showed No acute intracranial abnormality. Patchy chronic encephalomalacia the anterior frontal lobes. Though nonspecific this pattern is frequently the sequelae of trauma. Superimposed signal heterogeneity in the cerebral white matter and basal ganglia, most commonly due to chronic small vessel Disease.  Therapy evaluations did not recommend any follow up.  Pt reports weakness and dizziness have resolved.   DVT prophylaxis:Eliquis.  Code Status: (Full/ code) Family Communication: None at bedside.  Disposition:   Status is: Inpatient  Remains inpatient appropriate because:Unsafe d/c plan and Inpatient level of care appropriate due to severity of illness   Dispo: The patient is from: Home  Anticipated d/c is to: Home              Anticipated d/c date is: 2 days              Patient currently is not medically stable to d/c.    Difficult to place patient No       Level of care: Telemetry Cardiac Consultants:   Cardiology.   Procedures:  Scheduled for TEE DCCV tomorrow.  Antimicrobials: None.   Subjective: He reports constipation, added senna and colace.  No nausea, vomiting or abdominal pain.   Objective: Vitals:   06/18/20 1332 06/18/20 1939 06/19/20 0800 06/19/20 1351  BP: 114/82 103/72  101/70  Pulse: 96 (!) 101  100  Resp: 18 17  16   Temp: 97.8 F (36.6 C) 98.4 F (36.9 C)  98.7 F (37.1 C)  TempSrc: Oral Oral  Oral  SpO2: 98% 99%  99%  Weight:   103.9 kg   Height:        Intake/Output Summary (Last 24 hours) at 06/19/2020 0932 Last data filed at 06/19/2020 1242 Gross per 24 hour  Intake 723 ml  Output 2125 ml  Net -1402 ml   Filed Weights   06/17/20 0223 06/19/20 0800  Weight: 107.9 kg 103.9 kg    Examination:  General exam: Alert and comfortable, not in any kind of distress on room air Respiratory system: Clear to auscultation bilaterally, no wheezing or rhonchi Cardiovascular system: S1-S2 heard, regular rate rhythm no pedal edema Gastrointestinal system: Abdomen is soft, nontender bowel sounds normal Central nervous system: Alert and oriented, grossly nonfocal Extremities: No cyanosis Skin: No rashes seen Psychiatry: Mood is appropriate  Data Reviewed: I have personally reviewed following labs and imaging studies  CBC: Recent Labs  Lab 06/16/20 1628 06/16/20 2334 06/19/20 0135  WBC 7.4 7.2 7.2  NEUTROABS 4.6  --   --   HGB 11.6* 13.4 13.9  HCT 37.3* 39.8 40.0  MCV 94.7 88.1 86.6  PLT 204 228 355    Basic Metabolic Panel: Recent Labs  Lab 06/16/20 1628 06/16/20 2334 06/17/20 0232 06/18/20 0132 06/18/20 1136 06/19/20 0135  NA 142  --  140 139  --  137  K 3.0*  --  3.4* 3.3*  --  4.0  CL 110  --  104 104  --  101  CO2 20*  --  25 24  --  25  GLUCOSE 87  --  116* 97  --  112*  BUN 18  --  16 13  --  9  CREATININE 1.44* 1.55* 1.45* 1.21  --  1.36*   CALCIUM 8.0*  --  8.9 9.0  --  9.8  MG 1.3*  --  1.9  --  1.9  --     GFR: Estimated Creatinine Clearance: 73.3 mL/min (A) (by C-G formula based on SCr of 1.36 mg/dL (H)).  Liver Function Tests: Recent Labs  Lab 06/16/20 1628  AST 25  ALT 17  ALKPHOS 70  BILITOT 1.1  PROT 5.9*  ALBUMIN 3.3*    CBG: No results for input(s): GLUCAP in the last 168 hours.   Recent Results (from the past 240 hour(s))  Resp Panel by RT-PCR (Flu A&B, Covid) Nasopharyngeal Swab     Status: None   Collection Time: 06/16/20  4:28 PM   Specimen: Nasopharyngeal Swab; Nasopharyngeal(NP) swabs in vial transport medium  Result Value Ref Range Status   SARS Coronavirus 2 by RT PCR NEGATIVE NEGATIVE Final  Comment: (NOTE) SARS-CoV-2 target nucleic acids are NOT DETECTED.  The SARS-CoV-2 RNA is generally detectable in upper respiratory specimens during the acute phase of infection. The lowest concentration of SARS-CoV-2 viral copies this assay can detect is 138 copies/mL. A negative result does not preclude SARS-Cov-2 infection and should not be used as the sole basis for treatment or other patient management decisions. A negative result may occur with  improper specimen collection/handling, submission of specimen other than nasopharyngeal swab, presence of viral mutation(s) within the areas targeted by this assay, and inadequate number of viral copies(<138 copies/mL). A negative result must be combined with clinical observations, patient history, and epidemiological information. The expected result is Negative.  Fact Sheet for Patients:  EntrepreneurPulse.com.au  Fact Sheet for Healthcare Providers:  IncredibleEmployment.be  This test is no t yet approved or cleared by the [place] FDA and  has been authorized for detection and/or diagnosis of SARS-CoV-2 by FDA under an Emergency Use Authorization (EUA). This EUA will remain  in effect (meaning this  test can be used) for the duration of the COVID-19 declaration under Section 564(b)(1) of the Act, 21 U.S.C.section 360bbb-3(b)(1), unless the authorization is terminated  or revoked sooner.       Influenza A by PCR NEGATIVE NEGATIVE Final   Influenza B by PCR NEGATIVE NEGATIVE Final    Comment: (NOTE) The Xpert Xpress SARS-CoV-2/FLU/RSV plus assay is intended as an aid in the diagnosis of influenza from Nasopharyngeal swab specimens and should not be used as a sole basis for treatment. Nasal washings and aspirates are unacceptable for Xpert Xpress SARS-CoV-2/FLU/RSV testing.  Fact Sheet for Patients: EntrepreneurPulse.com.au  Fact Sheet for Healthcare Providers: IncredibleEmployment.be  This test is not yet approved or cleared by the [place] FDA and has been authorized for detection and/or diagnosis of SARS-CoV-2 by FDA under an Emergency Use Authorization (EUA). This EUA will remain in effect (meaning this test can be used) for the duration of the COVID-19 declaration under Section 564(b)(1) of the Act, 21 U.S.C. section 360bbb-3(b)(1), unless the authorization is terminated or revoked.  Performed at [facility], [place] [address] [place], [place] [address]          Radiology Studies: No results found.      Scheduled Meds:  apixaban  5 mg Oral BID   atorvastatin  80 mg Oral Daily   furosemide  60 mg Intravenous BID   gabapentin  300 mg Oral TID   olopatadine  1 drop Both Eyes Daily   sodium chloride flush  3 mL Intravenous Q12H   spironolactone  12.5 mg Oral QHS   tamsulosin  0.4 mg Oral Daily   Continuous Infusions:  sodium chloride     [START ON 06/20/2020] sodium chloride       LOS: 3 days       Hosie Poisson, MD [facility]   To contact the attending provider between 7A-7P or the covering provider during after hours 7P-7A, please log into the web site [email or web address] and access  using universal [place] password for that web site. If you do not have the password, please call the hospital operator.  06/19/2020, 4:02 PM

## 2020-06-19 NOTE — Care Management (Signed)
06-19-20 1027 Benefits check submitted for Eliquis. Case Manager will follow for cost. Graves-Bigelow, Ocie Cornfield, RN,BSN Case Manager

## 2020-06-19 NOTE — TOC Benefit Eligibility Note (Signed)
Transition of Care [facility]) Benefit Eligibility Note    Patient Details  Name: Stanton Kissoon MRN: [id number] Date of Birth: [birth year]   Medication/Dose: ELIQUIS  5 MG BID   and    APIXABAN - NON-FORMULARY  Covered?: Yes  Tier: 3 Drug  Prescription Coverage Preferred Pharmacy: [facility]   and  [facility]  Spoke with Person/Company/Phone Number:: [employer]  @ [employer] YX # [phone]  Co-Pay: 25 % OF TOTAL COST  Prior Approval: No  Deductible: Unmet (OUT-OF-POCKET  /OWE INCOME SUBSIDY  LEVEL 2)  Additional Notes: MEDICAID OF [place] : EFF-DATE  03-05-2020   CO-PAY- $ 4.00 FOR EACH PRESCRIPTION    Memory Argue Phone Number: 06/19/2020, 12:37 PM
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
## 2020-06-20 ENCOUNTER — Encounter (HOSPITAL_COMMUNITY): Payer: Self-pay | Admitting: Internal Medicine

## 2020-06-20 ENCOUNTER — Inpatient Hospital Stay (HOSPITAL_COMMUNITY): Payer: Medicare Other | Admitting: Anesthesiology

## 2020-06-20 ENCOUNTER — Inpatient Hospital Stay (HOSPITAL_COMMUNITY): Payer: Medicare Other

## 2020-06-20 ENCOUNTER — Encounter (HOSPITAL_COMMUNITY)
Admission: EM | Disposition: A | Discharge status: Home / Self Care | Payer: Self-pay | Source: Home / Self Care | Attending: Internal Medicine

## 2020-06-20 DIAGNOSIS — I5020 Unspecified systolic (congestive) heart failure: Secondary | ICD-10-CM | POA: Diagnosis not present

## 2020-06-20 DIAGNOSIS — E876 Hypokalemia: Secondary | ICD-10-CM | POA: Diagnosis not present

## 2020-06-20 DIAGNOSIS — I5082 Biventricular heart failure: Secondary | ICD-10-CM | POA: Diagnosis not present

## 2020-06-20 DIAGNOSIS — I25119 Atherosclerotic heart disease of native coronary artery with unspecified angina pectoris: Secondary | ICD-10-CM | POA: Diagnosis not present

## 2020-06-20 DIAGNOSIS — I4891 Unspecified atrial fibrillation: Secondary | ICD-10-CM

## 2020-06-20 DIAGNOSIS — R001 Bradycardia, unspecified: Secondary | ICD-10-CM | POA: Diagnosis not present

## 2020-06-20 DIAGNOSIS — I4892 Unspecified atrial flutter: Secondary | ICD-10-CM | POA: Diagnosis not present

## 2020-06-20 DIAGNOSIS — I340 Nonrheumatic mitral (valve) insufficiency: Secondary | ICD-10-CM

## 2020-06-20 HISTORY — PX: CARDIOVERSION: SHX1299

## 2020-06-20 HISTORY — PX: TEE WITHOUT CARDIOVERSION: SHX5443

## 2020-06-20 LAB — BASIC METABOLIC PANEL
Anion gap: 15 (ref 5–15)
BUN: 15 mg/dL (ref 6–20)
CO2: 25 mmol/L (ref 22–32)
Calcium: 9.8 mg/dL (ref 8.9–10.3)
Chloride: 96 mmol/L — ABNORMAL LOW (ref 98–111)
Creatinine, Ser: 1.49 mg/dL — ABNORMAL HIGH (ref 0.61–1.24)
GFR, Estimated: 55 mL/min — ABNORMAL LOW (ref >60)
Glucose, Bld: 103 mg/dL — ABNORMAL HIGH (ref 70–99)
Potassium: 3.3 mmol/L — ABNORMAL LOW (ref 3.5–5.1)
Sodium: 136 mmol/L (ref 135–145)

## 2020-06-20 LAB — CBC
HCT: 48.1 % (ref 39.0–52.0)
Hemoglobin: 15.8 g/dL (ref 13.0–17.0)
MCH: 28.9 pg (ref 26.0–34.0)
MCHC: 32.8 g/dL (ref 30.0–36.0)
MCV: 87.9 fL (ref 80.0–100.0)
Platelets: 230 10*3/uL (ref 150–400)
RBC: 5.47 MIL/uL (ref 4.22–5.81)
RDW: 15.6 % — ABNORMAL HIGH (ref 11.5–15.5)
WBC: 6.5 10*3/uL (ref 4.0–10.5)
nRBC: 0 % (ref 0.0–0.2)

## 2020-06-20 SURGERY — ECHOCARDIOGRAM, TRANSESOPHAGEAL
Anesthesia: Monitor Anesthesia Care

## 2020-06-20 MED ORDER — PROPOFOL 10 MG/ML IV BOLUS
INTRAVENOUS | Status: DC | PRN
  Administered 2020-06-20: 50 mg via INTRAVENOUS
  Administered 2020-06-20: 20 mg via INTRAVENOUS
  Administered 2020-06-20: 50 mg via INTRAVENOUS

## 2020-06-20 MED ORDER — ALLOPURINOL 100 MG PO TABS
100.0000 mg | ORAL_TABLET | Freq: Every day | ORAL | Status: DC
  Administered 2020-06-21: 100 mg via ORAL
  Filled 2020-06-20: qty 1

## 2020-06-20 MED ORDER — QUETIAPINE FUMARATE 25 MG PO TABS
25.0000 mg | ORAL_TABLET | Freq: Once | ORAL | Status: AC
  Administered 2020-06-20: 25 mg via ORAL
  Filled 2020-06-20: qty 1

## 2020-06-20 MED ORDER — LIDOCAINE 2% (20 MG/ML) 5 ML SYRINGE
INTRAMUSCULAR | Status: DC | PRN
  Administered 2020-06-20: 60 mg via INTRAVENOUS

## 2020-06-20 MED ORDER — PHENYLEPHRINE HCL-NACL 10-0.9 MG/250ML-% IV SOLN
INTRAVENOUS | Status: DC | PRN
  Administered 2020-06-20: 25 ug/min via INTRAVENOUS

## 2020-06-20 MED ORDER — POTASSIUM CHLORIDE CRYS ER 20 MEQ PO TBCR
40.0000 meq | EXTENDED_RELEASE_TABLET | ORAL | Status: AC
  Administered 2020-06-20 (×2): 40 meq via ORAL
  Filled 2020-06-20 (×2): qty 2

## 2020-06-20 MED ORDER — PROPOFOL 500 MG/50ML IV EMUL
INTRAVENOUS | Status: DC | PRN
  Administered 2020-06-20: 100 ug/kg/min via INTRAVENOUS

## 2020-06-20 MED ORDER — SODIUM CHLORIDE 0.9 % IV SOLN: INTRAVENOUS | Status: DC | PRN

## 2020-06-20 NOTE — Progress Notes (Signed)
  Echocardiogram Echocardiogram Transesophageal has been performed.  Cody Guzman 06/20/2020, 4:13 PM

## 2020-06-20 NOTE — Transfer of Care (Signed)
Immediate Anesthesia Transfer of Care Note  Patient: Cody Guzman  Procedure(s) Performed: TRANSESOPHAGEAL ECHOCARDIOGRAM (TEE) (N/A ) CARDIOVERSION (N/A )  Patient Location: PACU  Anesthesia Type:MAC  Level of Consciousness: drowsy and patient cooperative  Airway & Oxygen Therapy: Patient Spontanous Breathing and Patient connected to face mask oxygen  Post-op Assessment: Report given to RN and Post -op Vital signs reviewed and stable  Post vital signs: Reviewed and stable  Last Vitals:  Vitals Value Taken Time  BP    Temp    Pulse    Resp    SpO2      Last Pain:  Vitals:   06/20/20 1433  TempSrc: Oral  PainSc:          Complications: No complications documented.
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
## 2020-06-20 NOTE — CV Procedure (Signed)
   TRANSESOPHAGEAL ECHOCARDIOGRAM GUIDED DIRECT CURRENT CARDIOVERSION  NAME:  Cody Guzman   MRN: [id number] DOB:  08/28/1965   ADMIT DATE: 06/16/2020  INDICATIONS:  Atrial flutter   PROCEDURE:   Informed consent was obtained prior to the procedure. The risks, benefits and alternatives for the procedure were discussed and the patient comprehended these risks.  Risks include, but are not limited to, cough, sore throat, vomiting, nausea, somnolence, esophageal and stomach trauma or perforation, bleeding, low blood pressure, aspiration, pneumonia, infection, trauma to the teeth and death.    After a procedural time-out, the oropharynx was anesthetized and the patient was sedated by the anesthesia service. The transesophageal probe was inserted in the esophagus and stomach without difficulty and multiple views were obtained.   FINDINGS:  LEFT VENTRICLE: EF = 20%  RIGHT VENTRICLE: Severely HK  LEFT ATRIUM: Markedly dilated  LEFT ATRIAL APPENDAGE: No clot  RIGHT ATRIUM: Severely enlarged  AORTIC VALVE:  Trileaflet. Mildly calcified Triv AI  MITRAL VALVE:    Mild MR  TRICUSPID VALVE: Mild TR  PULMONIC VALVE: Triv PI  INTERATRIAL SEPTUM: No PFO or ASD  PERICARDIUM: No effusion  DESCENDING AORTA: Mild plaque   CARDIOVERSION:     Indications:  Atrial Flutter  Procedure Details:  Once the TEE was complete, the patient had the defibrillator pads placed in the anterior and posterior position. Once an appropriate level of sedation was achieved, the patient received a single biphasic, synchronized 200J shock with prompt conversion to sinus rhythm. No apparent complications.   Glori Bickers, MD  9:45 PM

## 2020-06-20 NOTE — Anesthesia Procedure Notes (Signed)
Procedure Name: MAC Date/Time: 06/20/2020 3:40 PM Performed by: Renato Shin, CRNA Pre-anesthesia Checklist: Patient identified, Emergency Drugs available, Suction available and Patient being monitored Patient Re-evaluated:Patient Re-evaluated prior to induction Oxygen Delivery Method: Nasal cannula Preoxygenation: Pre-oxygenation with 100% oxygen Induction Type: IV induction Placement Confirmation: positive ETCO2 and breath sounds checked- equal and bilateral Dental Injury: Teeth and Oropharynx as per pre-operative assessment

## 2020-06-20 NOTE — Progress Notes (Addendum)
PROGRESS NOTE    Cody Guzman  TDV:[id number] DOB: Feb 01, 1966 DOA: 06/16/2020 PCP: [name], MD   Brief Narrative: Cody Guzman is a 55 y.o. male with a history of heart failure, CAD, CKD stage II, morbid obesity, NHL, bipolar disorder, hypertension, gout. Patient presented secondary to abdominal pain/bloating/blurry vision with evidence of symptomatic bradycardia but developed atrial flutter resulting in acute heart failure symptoms. Cardiology consulted. Patient started on IV diuresis with plan to have Transesophageal Echocardiogram/DCCV for atrial flutter.   Assessment & Plan:   Principal Problem:   Symptomatic bradycardia Active Problems:   Chronic kidney disease (CKD), stage II (mild)   Hypokalemia   Bipolar 1 disorder (HCC)   Coronary artery disease involving native coronary artery of native heart with angina pectoris (HCC)   NHL (non-Hodgkin's lymphoma) (HCC)   Symptomatic bradycardia Exacerbated by use of Coreg which was held. Improved/resolved.  Atrial flutter Cardiology consulted with recommendation for Transesophageal Echocardiogram/DCCV which is planned for 2/16 -Cardiology recommendations: Transesophageal Echocardiogram/DCCV -Continue telemetry -Eliquis  Acute on chronic systolic heart failure Biventricular heart failure Patient is on torsemide, Coreg, spironolactone as an outpatient. EF is stable at 20-25% with associated RV dysfunction and biatrial enlargement. He is also on Bidil. Cardiology consulted and have diuresed him with IV lasix. -Diuresis/spironolactone per cardiology  Likely OSA Recommendation for outpatient sleep study.  Primary hypertension Patient is on Bidil, Coreg, torsemide as an outpatient. Blood pressure is normotensive. Antihypertensives were held except lasix and spironolactone.  Hypokalemia Hypomagnesemia In setting of diuresis. Replete as needed  Obesity Body mass index is 32 kg/m.   Non-hodgkin's  lymphoma Noted. Not on active treatment.  AKI CKD stage II Baseline creatinine of about 1.2. Creatinine peak of 1.55 with initial improvement with diuresis. No trending back up.  Gout Patient is on allopurinol as an outpatient. Also on colchicine prn. -Restart home allopurinol  Hyperlipidemia -Continue Lipitor  Dysuria Urinalysis unremarkable. Appears to have resolved.  Esophageal thickening Mild. Seen on CT imaging. Possible esophagitis. Asymptomatic.  Right kidney lesion Indeterminate 12 mm in size with possible increase from 2020 exam. Recommendation for elective, non-emergent MRI for follow-up  LLE weakness MRI negative for acute CVA. PT evaluated with recommendation for no PT follow-up.   DVT prophylaxis: Eliquis Code Status:   Code Status: Full Code Family Communication: Wife at bedside Disposition Plan: Discharge likely in 2-3 days pending cardiology recommendations   Consultants:   Cardiology  Procedures:   TRANSTHORACIC ECHOCARDIOGRAM (06/17/20) IMPRESSIONS    1. Left ventricular ejection fraction, by estimation, is 20 to 25%. The  left ventricle has severely decreased function. The basal-to-apical  anterior, basal-to-mid septal (infero and anteroseptal), mid-to-apical  lateral (infero and anterolateral) LV  walls and apex appear akinetic. Indeterminate diastolic filling due to E-A  fusion.  2. Right ventricular systolic function is severely reduced. The right  ventricular size is mildly enlarged. There is mildly elevated pulmonary  artery systolic pressure.  3. Left atrial size was severely dilated.  4. Right atrial size was severely dilated.  5. The mitral valve is grossly normal. At least moderate, eccentric,  posterolaterally directed mitral valve regurgitation.  6. Tricuspid valve regurgitation is moderate due to lack of central  coaptation of the leaflets.  7. The aortic valve is tricuspid. There is mild calcification of the  aortic  valve. There is mild thickening of the aortic valve. Aortic valve  regurgitation is trivial. Mild aortic valve sclerosis is present, with no  evidence of aortic valve stenosis.  8.  The inferior vena cava is dilated in size with <50% respiratory  variability, suggesting right atrial pressure of 15 mmHg.   Antimicrobials:  None    Subjective: No dyspnea/chest pain today.  Objective: Vitals:   06/19/20 1351 06/19/20 2142 06/20/20 0752 06/20/20 0947  BP: 101/70 115/88  94/78  Pulse: 100 98    Resp: 16 16  16   Temp: 98.7 F (37.1 C) 99 F (37.2 C)  98.1 F (36.7 C)  TempSrc: Oral Oral  Oral  SpO2: 99% 94%  98%  Weight:   101.2 kg   Height:        Intake/Output Summary (Last 24 hours) at 06/20/2020 1149 Last data filed at 06/19/2020 2142 Gross per 24 hour  Intake 360 ml  Output 1525 ml  Net -1165 ml   Filed Weights   06/17/20 0223 06/19/20 0800 06/20/20 0752  Weight: 107.9 kg 103.9 kg 101.2 kg    Examination:  General exam: Appears calm and comfortable Respiratory system: Clear to auscultation. Respiratory effort normal. Cardiovascular system: S1 & S2 heard, RRR. No murmurs, rubs, gallops or clicks. Gastrointestinal system: Abdomen is nondistended, soft and nontender. No organomegaly or masses felt. Normal bowel sounds heard. Central nervous system: Alert and oriented. No focal neurological deficits. Musculoskeletal: No calf tenderness Skin: No cyanosis. No rashes Psychiatry: Judgement and insight appear normal. Mood & affect appropriate.     Data Reviewed: I have personally reviewed following labs and imaging studies  CBC Lab Results  Component Value Date   WBC 6.5 06/20/2020   RBC 5.47 06/20/2020   HGB 15.8 06/20/2020   HCT 48.1 06/20/2020   MCV 87.9 06/20/2020   MCH 28.9 06/20/2020   PLT 230 06/20/2020   MCHC 32.8 06/20/2020   RDW 15.6 (H) 06/20/2020   LYMPHSABS 2.0 06/16/2020   MONOABS 0.6 06/16/2020   EOSABS 0.2 06/16/2020   BASOSABS 0.1  56/31/4970     Last metabolic panel Lab Results  Component Value Date   NA 136 06/20/2020   K 3.3 (L) 06/20/2020   CL 96 (L) 06/20/2020   CO2 25 06/20/2020   BUN 15 06/20/2020   CREATININE 1.49 (H) 06/20/2020   GLUCOSE 103 (H) 06/20/2020   GFRNONAA 55 (L) 06/20/2020   GFRAA 79 09/26/2019   CALCIUM 9.8 06/20/2020   PHOS 3.8 07/08/2018   PROT 5.9 (L) 06/16/2020   ALBUMIN 3.3 (L) 06/16/2020   LABGLOB 2.9 05/03/2019   AGRATIO 1.5 05/03/2019   BILITOT 1.1 06/16/2020   ALKPHOS 70 06/16/2020   AST 25 06/16/2020   ALT 17 06/16/2020   ANIONGAP 15 06/20/2020    CBG (last 3)  No results for input(s): GLUCAP in the last 72 hours.   GFR: Estimated Creatinine Clearance: 66 mL/min (A) (by C-G formula based on SCr of 1.49 mg/dL (H)).  Coagulation Profile: No results for input(s): INR, PROTIME in the last 168 hours.  Recent Results (from the past 240 hour(s))  Resp Panel by RT-PCR (Flu A&B, Covid) Nasopharyngeal Swab     Status: None   Collection Time: 06/16/20  4:28 PM   Specimen: Nasopharyngeal Swab; Nasopharyngeal(NP) swabs in vial transport medium  Result Value Ref Range Status   SARS Coronavirus 2 by RT PCR NEGATIVE NEGATIVE Final    Comment: (NOTE) SARS-CoV-2 target nucleic acids are NOT DETECTED.  The SARS-CoV-2 RNA is generally detectable in upper respiratory specimens during the acute phase of infection. The lowest concentration of SARS-CoV-2 viral copies this assay can detect is 138 copies/mL. A negative  result does not preclude SARS-Cov-2 infection and should not be used as the sole basis for treatment or other patient management decisions. A negative result may occur with  improper specimen collection/handling, submission of specimen other than nasopharyngeal swab, presence of viral mutation(s) within the areas targeted by this assay, and inadequate number of viral copies(<138 copies/mL). A negative result must be combined with clinical observations, patient  history, and epidemiological information. The expected result is Negative.  Fact Sheet for Patients:  EntrepreneurPulse.com.au  Fact Sheet for Healthcare Providers:  IncredibleEmployment.be  This test is no t yet approved or cleared by the [place] FDA and  has been authorized for detection and/or diagnosis of SARS-CoV-2 by FDA under an Emergency Use Authorization (EUA). This EUA will remain  in effect (meaning this test can be used) for the duration of the COVID-19 declaration under Section 564(b)(1) of the Act, 21 U.S.C.section 360bbb-3(b)(1), unless the authorization is terminated  or revoked sooner.       Influenza A by PCR NEGATIVE NEGATIVE Final   Influenza B by PCR NEGATIVE NEGATIVE Final    Comment: (NOTE) The Xpert Xpress SARS-CoV-2/FLU/RSV plus assay is intended as an aid in the diagnosis of influenza from Nasopharyngeal swab specimens and should not be used as a sole basis for treatment. Nasal washings and aspirates are unacceptable for Xpert Xpress SARS-CoV-2/FLU/RSV testing.  Fact Sheet for Patients: EntrepreneurPulse.com.au  Fact Sheet for Healthcare Providers: IncredibleEmployment.be  This test is not yet approved or cleared by the [place] FDA and has been authorized for detection and/or diagnosis of SARS-CoV-2 by FDA under an Emergency Use Authorization (EUA). This EUA will remain in effect (meaning this test can be used) for the duration of the COVID-19 declaration under Section 564(b)(1) of the Act, 21 U.S.C. section 360bbb-3(b)(1), unless the authorization is terminated or revoked.  Performed at [facility], [place] [address], [place], [place] [address]         Radiology Studies: No results found.      Scheduled Meds: . apixaban  5 mg Oral BID  . atorvastatin  80 mg Oral Daily  . gabapentin  300 mg Oral TID  . olopatadine  1 drop Both Eyes Daily  .  potassium chloride  40 mEq Oral Q4H  . senna-docusate  2 tablet Oral BID  . sodium chloride flush  3 mL Intravenous Q12H  . spironolactone  12.5 mg Oral QHS  . tamsulosin  0.4 mg Oral Daily   Continuous Infusions: . sodium chloride    . sodium chloride       LOS: 4 days     Cordelia Poche, MD [facility] 06/20/2020, 11:49 AM  If 7PM-7AM, please contact night-coverage [email or web address]

## 2020-06-20 NOTE — Anesthesia Preprocedure Evaluation (Signed)
Anesthesia Evaluation  Patient identified by MRN, date of birth, ID band Patient awake    Reviewed: Allergy & Precautions, H&P , NPO status , Patient's Chart, lab work & pertinent test results  Airway Mallampati: II   Neck ROM: full    Dental   Pulmonary asthma , sleep apnea , Current Smoker and Patient abstained from smoking.,    breath sounds clear to auscultation       Cardiovascular hypertension, + CAD, + Past MI and +CHF   Rhythm:irregular Rate:Normal  Biventricular HF. LVEF 20%. Mod MR, mod TR, dilated LA and RA.   Neuro/Psych PSYCHIATRIC DISORDERS Anxiety Depression Bipolar Disorder  Neuromuscular disease    GI/Hepatic   Endo/Other    Renal/GU Renal InsufficiencyRenal disease     Musculoskeletal  (+) Arthritis ,   Abdominal   Peds  Hematology   Anesthesia Other Findings   Reproductive/Obstetrics                             Anesthesia Physical Anesthesia Plan  ASA: III  Anesthesia Plan: MAC   Post-op Pain Management:    Induction: Intravenous  PONV Risk Score and Plan: 0 and Propofol infusion and Treatment may vary due to age or medical condition  Airway Management Planned: Nasal Cannula  Additional Equipment:   Intra-op Plan:   Post-operative Plan:   Informed Consent: I have reviewed the patients History and Physical, chart, labs and discussed the procedure including the risks, benefits and alternatives for the proposed anesthesia with the patient or authorized representative who has indicated his/her understanding and acceptance.     Dental advisory given  Plan Discussed with: CRNA, Anesthesiologist and Surgeon  Anesthesia Plan Comments:         Anesthesia Quick Evaluation

## 2020-06-20 NOTE — Progress Notes (Signed)
CARDIAC REHAB PHASE I   PRE:  Rate/Rhythm: 103 aflutter    BP: sitting 102/82    SaO2: 98 RA  MODE:  Ambulation: 420 ft   POST:  Rate/Rhythm: 130 aflutter    BP: sitting 124/96     SaO2: 98 RA  Tolerated well, no c/o. HR up with activity, not symptomatic. Pt sts he read low sodium information. Will continue education [place], [place] 06/20/2020 1:42 PM

## 2020-06-20 NOTE — Interval H&P Note (Signed)
History and Physical Interval Note:  06/20/2020 1:39 PM  Cody Guzman  has presented today for surgery, with the diagnosis of afib/HF.  The various methods of treatment have been discussed with the patient and family. After consideration of risks, benefits and other options for treatment, the patient has consented to  Procedure(s): TRANSESOPHAGEAL ECHOCARDIOGRAM (TEE) (N/A) CARDIOVERSION (N/A) as a surgical intervention.  The patient's history has been reviewed, patient examined, no change in status, stable for surgery.  I have reviewed the patient's chart and labs.  Questions were answered to the patient's satisfaction.     Elyse Prevo
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
## 2020-06-20 NOTE — Care Management Important Message (Signed)
Important Message  Patient Details  Name: Cody Guzman MRN: [id number] Date of Birth: 02/17/1966   Medicare Important Message Given:  Yes     Shelda Altes 06/20/2020, 10:29 AM

## 2020-06-21 ENCOUNTER — Other Ambulatory Visit (HOSPITAL_COMMUNITY): Payer: Self-pay | Admitting: Family Medicine

## 2020-06-21 DIAGNOSIS — R001 Bradycardia, unspecified: Secondary | ICD-10-CM | POA: Diagnosis not present

## 2020-06-21 DIAGNOSIS — I4892 Unspecified atrial flutter: Secondary | ICD-10-CM

## 2020-06-21 LAB — CBC
HCT: 49.5 % (ref 39.0–52.0)
Hemoglobin: 16.3 g/dL (ref 13.0–17.0)
MCH: 28.8 pg (ref 26.0–34.0)
MCHC: 32.9 g/dL (ref 30.0–36.0)
MCV: 87.6 fL (ref 80.0–100.0)
Platelets: 246 10*3/uL (ref 150–400)
RBC: 5.65 MIL/uL (ref 4.22–5.81)
RDW: 15.7 % — ABNORMAL HIGH (ref 11.5–15.5)
WBC: 4.9 10*3/uL (ref 4.0–10.5)
nRBC: 0 % (ref 0.0–0.2)

## 2020-06-21 LAB — BASIC METABOLIC PANEL
Anion gap: 12 (ref 5–15)
BUN: 19 mg/dL (ref 6–20)
CO2: 23 mmol/L (ref 22–32)
Calcium: 9.7 mg/dL (ref 8.9–10.3)
Chloride: 102 mmol/L (ref 98–111)
Creatinine, Ser: 1.4 mg/dL — ABNORMAL HIGH (ref 0.61–1.24)
GFR, Estimated: 59 mL/min — ABNORMAL LOW (ref >60)
Glucose, Bld: 96 mg/dL (ref 70–99)
Potassium: 3.9 mmol/L (ref 3.5–5.1)
Sodium: 137 mmol/L (ref 135–145)

## 2020-06-21 MED ORDER — TORSEMIDE 20 MG PO TABS: 20.0000 mg | ORAL_TABLET | ORAL | 0 refills | Status: DC | PRN

## 2020-06-21 MED ORDER — APIXABAN 5 MG PO TABS: 5.0000 mg | ORAL_TABLET | Freq: Two times a day (BID) | ORAL | 0 refills | Status: DC

## 2020-06-21 MED ORDER — AMIODARONE HCL 200 MG PO TABS: 200.0000 mg | ORAL_TABLET | Freq: Every day | ORAL | 0 refills | Status: DC

## 2020-06-21 MED ORDER — AMIODARONE HCL 200 MG PO TABS
200.0000 mg | ORAL_TABLET | Freq: Every day | ORAL | Status: DC
  Administered 2020-06-21: 200 mg via ORAL
  Filled 2020-06-21: qty 1

## 2020-06-21 MED ORDER — POTASSIUM CHLORIDE CRYS ER 20 MEQ PO TBCR
20.0000 meq | EXTENDED_RELEASE_TABLET | Freq: Once | ORAL | Status: AC
  Administered 2020-06-21: 20 meq via ORAL
  Filled 2020-06-21: qty 1

## 2020-06-21 MED FILL — AMIODARONE HCL 200 MG TAB: 200 | 90 days supply | Qty: 90 | Fill #0

## 2020-06-21 MED FILL — TORSEMIDE 20 MG TABLET: 20 | 30 days supply | Qty: 30 | Fill #0

## 2020-06-21 MED FILL — ELIQUIS 5 MG TABLET: 5 | 90 days supply | Qty: 180 | Fill #0

## 2020-06-21 NOTE — Progress Notes (Signed)
CARDIAC REHAB PHASE I   PRE:  Rate/Rhythm: 91 SR    MODE:  Ambulation: 1350 ft   POST:  Rate/Rhythm: 101 ST    BP: sitting didn't register     SaO2:   Pt up walking hall independently with his service dog. Feels well, less fatigue. Remained in NSR entire walk and was able to walk a long distance without rest. On return to room, pt to recliner. While we were discussing ed, pt had bout of aflutter, ? 1 min. Max rate 109. Pt asx and converted to NSR on his own.   Discussed HF booklet, daily wts, low sodium, fluid restriction, exercise, and gave him Off the Beat booklet. Pt receptive. He quit smoking 2 months ago. Congratulated him, he sts it is mind over matter. He did not want to discuss marijuana. I encouraged him to discuss risk/benefit with MD and also methods of use. [place], ACSM 06/21/2020 9:49 AM

## 2020-06-21 NOTE — Discharge Instructions (Signed)
Cody Guzman,  You were in the hospital with atrial flutter. You were shocked which converted you to a regular rhythm. You have been started on new medications. Please follow-up with cardiology as recommended.   Information on my medicine - ELIQUIS (apixaban)  This medication education was reviewed with me or my healthcare representative as part of my discharge preparation.  The pharmacist that spoke with me during my hospital stay was:  Einar Grad, [facility]  Why was Eliquis prescribed for you? Eliquis was prescribed for you to reduce the risk of a blood clot forming that can cause a stroke if you have a medical condition called atrial fibrillation (a type of irregular heartbeat).  What do You need to know about Eliquis ? Take your Eliquis TWICE DAILY - one tablet in the morning and one tablet in the evening with or without food. If you have difficulty swallowing the tablet whole please discuss with your pharmacist how to take the medication safely.  Take Eliquis exactly as prescribed by your doctor and DO NOT stop taking Eliquis without talking to the doctor who prescribed the medication.  Stopping may increase your risk of developing a stroke.  Refill your prescription before you run out.  After discharge, you should have regular check-up appointments with your healthcare provider that is prescribing your Eliquis.  In the future your dose may need to be changed if your kidney function or weight changes by a significant amount or as you get older.  What do you do if you miss a dose? If you miss a dose, take it as soon as you remember on the same day and resume taking twice daily.  Do not take more than one dose of ELIQUIS at the same time to make up a missed dose.  Important Safety Information A possible side effect of Eliquis is bleeding. You should call your healthcare provider right away if you experience any of the following: ? Bleeding from an injury or your nose that  does not stop. ? Unusual colored urine (red or dark brown) or unusual colored stools (red or black). ? Unusual bruising for unknown reasons. ? A serious fall or if you hit your head (even if there is no bleeding).  Some medicines may interact with Eliquis and might increase your risk of bleeding or clotting while on Eliquis. To help avoid this, consult your healthcare provider or pharmacist prior to using any new prescription or non-prescription medications, including herbals, vitamins, non-steroidal anti-inflammatory drugs (NSAIDs) and supplements.  This website has more information on Eliquis (apixaban): http://www.eliquis.com/eliquis/home

## 2020-06-21 NOTE — Anesthesia Postprocedure Evaluation (Addendum)
Anesthesia Post Note  Patient: Cody Guzman  Procedure(s) Performed: TRANSESOPHAGEAL ECHOCARDIOGRAM (TEE) (N/A ) CARDIOVERSION (N/A )     Patient location during evaluation: Endoscopy Anesthesia Type: General Level of consciousness: awake and alert Pain management: pain level controlled Vital Signs Assessment: post-procedure vital signs reviewed and stable Respiratory status: spontaneous breathing, nonlabored ventilation, respiratory function stable and patient connected to nasal cannula oxygen Cardiovascular status: stable and blood pressure returned to baseline Postop Assessment: no apparent nausea or vomiting Anesthetic complications: no   No complications documented.  Last Vitals:  Vitals:   06/21/20 0454 06/21/20 0831  BP:  (!) 159/85  Pulse: 75 78  Resp: 20 20  Temp: 36.6 C   SpO2: 100% 94%    Last Pain:  Vitals:   06/21/20 0831  TempSrc:   PainSc: 0-No pain                 Catalina Gravel
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
## 2020-06-21 NOTE — Discharge Summary (Signed)
Physician Discharge Summary  Cody Guzman CHY:[id number] DOB: 08-06-1965 DOA: 06/16/2020  PCP: [name], MD  Admit date: 06/16/2020 Discharge date: 06/21/2020  Admitted From: Home Disposition: Home  Recommendations for Outpatient Follow-up:  1. Follow up with PCP in 1 week 2. Follow up with cardiology in 1 week 3. Please follow up on the following pending results: None  Home Health: None Equipment/Devices: None  Discharge Condition: Stable CODE STATUS: Full code Diet recommendation: Heart healthy   Brief/Interim Summary:  Admission HPI written by [name], MD   Chief Complaint: Abdominal pain, bloating and blurred vision  HPI: Cody Guzman is a 55 y.o. male with medical history significant of advanced cardiomyopathy with EF last reported of about 20%, coronary artery disease, chronic kidney disease stage III, morbid obesity, non-Hodgkin's lymphoma as a teenager, prior history of substance abuse, bipolar disorder, essential hypertension, gout. Who presented to the ER with abdominal pain dizziness and bloating.  Patient was noted to be significantly bradycardic.  He is notably on Coreg at home but does not new medicine.  Patient has history of migraines but not having any headaches now.  Denied using any substances as he has quit drugs.  He appears to have symptomatic bradycardia.  Patient has.  Improved while in the ER with heart rate rebounded a little.  Cardiology consulted and we are admitting patient for further work-up.  ED Course: 97.4 blood pressure 85/65.  Pulse 95 respiratory rate 30 oxygen sats 89% on room air.  White count 7.4 hemoglobin 11.6 platelets 204.  Sodium 142 potassium 3.0 chloride 110 CO2 20 BUN 18 creatinine 1.44 and calcium 8.0.  BNP is 361 troponin 29 then 17.  COVID-19 screen is negative.  CT abdomen and pelvis shows mild distal esophageal wall thickening probably reflux or esophagitis.  Right adrenal nodule and right kidney  mild lesion.  Chest x-ray showed cardiomegaly with pulmonary vascular congestion.  Patient being admitted to the hospital with symptomatic bradycardia and congestive heart failure.  Hospital course:  Symptomatic bradycardia Exacerbated by use of Coreg which was held. Improved/resolved. Coreg discontinued.  Atrial flutter Cardiology consulted with recommendation for Transesophageal Echocardiogram/DCCV which was successfully performed on 2/16 with transition to normal sinus rhythm. On day of discharge, while ambulating, patient developed a short episode of atrial flutter so he was started on amiodarone per cardiology. Coreg discontinued on discharge. Continue Eliquis on discharge.  Acute on chronic systolic heart failure Biventricular heart failure Patient is on torsemide, Coreg, spironolactone as an outpatient. EF is stable at 20-25% with associated RV dysfunction and biatrial enlargement. He is also on Bidil. Cardiology consulted and have diuresed him with IV lasix. Recommendation to restart home torsemide as needed  Likely OSA Recommendation for outpatient sleep study.  Primary hypertension Patient is on Bidil, Coreg, torsemide as an outpatient. Blood pressure is normotensive. Antihypertensives were held except lasix and spironolactone. Bidil, Coreg discontinued on discharge.  Hypokalemia Hypomagnesemia In setting of diuresis. Replete as needed  Obesity Body mass index is 32 kg/m.   Non-hodgkin's lymphoma Noted. Not on active treatment.  AKI CKD stage II Baseline creatinine of about 1.2. Creatinine peak of 1.55 with initial improvement with diuresis. No trending back up.  Gout Patient is on allopurinol as an outpatient. Also on colchicine prn.  Hyperlipidemia Continue Lipitor  Dysuria Urinalysis unremarkable. Appears to have resolved.  Esophageal thickening Mild. Seen on CT imaging. Possible esophagitis. Asymptomatic.  Right kidney lesion Indeterminate 12  mm in size with possible increase  from 2020 exam. Recommendation for elective, non-emergent MRI for follow-up  LLE weakness MRI negative for acute CVA. PT evaluated with recommendation for no PT follow-up.  Discharge Diagnoses:  Principal Problem:   Symptomatic bradycardia Active Problems:   Chronic kidney disease (CKD), stage II (mild)   Hypokalemia   Bipolar 1 disorder (HCC)   Coronary artery disease involving native coronary artery of native heart with angina pectoris (HCC)   NHL (non-Hodgkin's lymphoma) ([place])    Discharge Instructions   Allergies as of 06/21/2020   No Known Allergies     Medication List    STOP taking these medications   aspirin EC 81 MG tablet   carvedilol 6.25 MG tablet Commonly known as: COREG   isosorbide-hydrALAZINE 20-37.5 MG tablet Commonly known as: BIDIL     TAKE these medications   albuterol 108 (90 Base) MCG/ACT inhaler Commonly known as: VENTOLIN HFA Inhale 2 puffs into the lungs every 6 (six) hours as needed for wheezing or shortness of breath.   allopurinol 100 MG tablet Commonly known as: ZYLOPRIM Take 1 tablet (100 mg total) by mouth daily.   amiodarone 200 MG tablet Commonly known as: PACERONE Take 1 tablet (200 mg total) by mouth daily.   apixaban 5 MG Tabs tablet Commonly known as: ELIQUIS Take 1 tablet (5 mg total) by mouth 2 (two) times daily.   atorvastatin 80 MG tablet Commonly known as: LIPITOR Take 1 tablet (80 mg total) by mouth daily.   bisacodyl 5 MG EC tablet Commonly known as: DULCOLAX Take 5 mg by mouth 2 (two) times daily.   Colchicine 0.6 MG Caps Commonly known as: Mitigare Take 1 capsule by mouth daily as needed (gout flare).   gabapentin 300 MG capsule Commonly known as: NEURONTIN Take 1 capsule (300 mg total) by mouth 3 (three) times daily.   nitroGLYCERIN 0.4 MG SL tablet Commonly known as: NITROSTAT Place 1 tablet (0.4 mg total) under the tongue every 5 (five) minutes as needed for chest  pain.   Pataday 0.1 % ophthalmic solution Generic drug: olopatadine Place 1 drop into both eyes daily.   senna-docusate 8.6-50 MG tablet Commonly known as: Senokot-S Take 1 tablet by mouth 2 (two) times daily.   spironolactone 25 MG tablet Commonly known as: ALDACTONE Take 0.5 tablets (12.5 mg total) by mouth daily.   tadalafil 10 MG tablet Commonly known as: Cialis Take 1 tablet (10 mg total) by mouth daily as needed for erectile dysfunction.   tamsulosin 0.4 MG Caps capsule Commonly known as: FLOMAX Take 1 capsule (0.4 mg total) by mouth daily.   torsemide 20 MG tablet Commonly known as: DEMADEX Take 1 tablet (20 mg total) by mouth as needed. 3-4 POUND WEIGHT GAIN IN 24 HOURS What changed:   when to take this  reasons to take this  additional instructions       Follow-up Information    [place] Follow up on 06/27/2020.   Specialty: Cardiology Why: at 3:00. Located in the [place] at [facility] on the 1st floor.  Contact information: [address] [phone] Danice Goltz [place]       [name], MD. Schedule an appointment as soon as possible for a visit in 1 week(s).   Specialty: Pulmonary Disease Why: Hospital follow-up Contact information: [address] [address] [phone]              No Known Allergies  Consultations:  Cardiology   Procedures/Studies: DG Chest 2 View  Result Date: 06/16/2020 CLINICAL DATA:  Dizzy this and bradycardia EXAM: CHEST - 2 VIEW COMPARISON:  March 23, 2019 FINDINGS: There is no edema or airspace opacity. There is cardiomegaly with pulmonary venous hypertension. No adenopathy. No bone lesions. IMPRESSION: Cardiomegaly with pulmonary vascular congestion.  Lungs clear. Electronically Signed   By: Lowella Grip III M.D.   On: 06/16/2020 17:18   MR BRAIN WO CONTRAST  Result Date:  06/17/2020 CLINICAL DATA:  55 year old male with neurologic deficit. Symptomatic bradycardia. EXAM: MRI HEAD WITHOUT CONTRAST TECHNIQUE: Multiplanar, multiecho pulse sequences of the brain and surrounding structures were obtained without intravenous contrast. COMPARISON:  None. FINDINGS: Brain: No restricted diffusion to suggest acute infarction. No midline shift, mass effect, evidence of mass lesion, ventriculomegaly, extra-axial collection or acute intracranial hemorrhage. Cervicomedullary junction and pituitary are within normal limits. Cavum septum pellucidum, normal variant. Patchy chronic encephalomalacia in both anterior frontal lobes, left superior frontal and right inferior frontal gyri. No definite chronic cerebral blood products. Patchy additional nonspecific bilateral cerebral white matter T2 and FLAIR hyperintensity which is more symmetric. Mild T2 heterogeneity in the bilateral basal ganglia more resemble small-vessel disease than perivascular spaces. Thalami, and brainstem appear negative. Questionable small chronic cerebellar infarcts medially on series 10, image 7. Vascular: Major intracranial vascular flow voids are preserved. Mild generalized intracranial artery tortuosity. Skull and upper cervical spine: Negative visible cervical spine. Visualized bone marrow signal is within normal limits. Sinuses/Orbits: Negative orbits. Trace paranasal sinus mucosal thickening. Other: Mastoids are clear. Visible internal auditory structures appear normal. Scalp and face soft tissues appear negative. IMPRESSION: 1. No acute intracranial abnormality. 2. Patchy chronic encephalomalacia the anterior frontal lobes. Though nonspecific this pattern is frequently the sequelae of trauma. 3. Superimposed signal heterogeneity in the cerebral white matter and basal ganglia, most commonly due to chronic small vessel disease. Electronically Signed   By: Genevie Ann M.D.   On: 06/17/2020 05:58   CT ABDOMEN PELVIS W  CONTRAST  Result Date: 06/16/2020 CLINICAL DATA:  Acute abdominal pain and distension.  Nausea. EXAM: CT ABDOMEN AND PELVIS WITH CONTRAST TECHNIQUE: Multidetector CT imaging of the abdomen and pelvis was performed using the standard protocol following bolus administration of intravenous contrast. CONTRAST:  172mL OMNIPAQUE IOHEXOL 300 MG/ML  SOLN COMPARISON:  CT 07/06/2018 FINDINGS: Lower chest: Upper normal heart size with coronary artery calcifications. No acute airspace disease or pleural fluid. Hepatobiliary: No focal liver abnormality is seen. Borderline decreased hepatic density suggesting steatosis. Prominence of the IVC and hepatic veins. Gallbladder partially distended. No calcified gallstone. No pericholecystic fat stranding. There is no biliary dilatation. Pancreas: Unremarkable. No pancreatic ductal dilatation or surrounding inflammatory changes. Spleen: Normal in size without focal abnormality. Adrenals/Urinary Tract: Stable low-density thickening of the left adrenal gland. The previous right adrenal nodule is less nodular on the current exam, no increase in size. No hydronephrosis or perinephric edema. Minimal cortical scarring in the lower left kidney. Indeterminate 12 mm partially exophytic lesion from the lower right kidney, series 3, image 4, possibly increased in size from prior. Additional small subcentimeter renal cortical hypodensities are too small to characterize. No visualized renal calculi. Urinary bladder is unremarkable. Stomach/Bowel: Mild distal esophageal wall thickening. Fluid/ingested material distends the stomach. No definite gastric wall thickening. Duodenal diverticulum arising from the third and fourth portion without inflammation. Normal positioning of the ligament of Treitz. No small bowel obstruction or inflammation. Mild fecalization of distal small bowel contents. Normal appendix.  Majority of the colon is decompressed. No colonic wall thickening or inflammation.  Vascular/Lymphatic: Mild aortic and moderate bi-iliac atherosclerosis. No aneurysm. Patent portal vein. Right scrotal varicoceles which have progressed from prior exam. No retroperitoneal obstruction. No enlarged lymph nodes in the abdomen or pelvis. Reproductive: Prostate is unremarkable. Other: No ascites or free air. Small fat containing umbilical hernia. Musculoskeletal: There are no acute or suspicious osseous abnormalities. Stable peripherally sclerotic lucency in the intertrochanteric right femur, unchanged. Mild lumbar spondylosis. IMPRESSION: 1. Mild distal esophageal wall thickening, can be seen with reflux or esophagitis. 2. Findings suggesting elevated right heart pressure with dilated hepatic veins and IVC. Right scrotal varicoceles which have progressed from prior exam, possibly due to right heart failure. No retroperitoneal obstruction. 3. Indeterminate 12 mm lesion in the right kidney, possibly increased in size from 2020 exam. Recommend non emergent MRI characterization on an elective basis. 4. Previous right adrenal nodule appears less nodular on the current exam, no progression or increase in size. Aortic Atherosclerosis (ICD10-I70.0). Electronically Signed   By: Keith Rake M.D.   On: 06/16/2020 19:12   ECHOCARDIOGRAM COMPLETE  Result Date: 06/17/2020    ECHOCARDIOGRAM REPORT   Patient Name:   CHEVIS WEISENSEL Date of Exam: 06/17/2020 Medical Rec #:  [id number]           Height:       70.0 in Accession #:    [id number]          Weight:       237.9 lb Date of Birth:  [birth year]            BSA:          2.247 m Patient Age:    55 years            BP:           103/88 mmHg Patient Gender: M                   HR:           95 bpm. Exam Location:  Inpatient Procedure: 2D Echo, Color Doppler, Cardiac Doppler and Intracardiac            Opacification Agent Indications:    E83.15 Acute systolic (congestive) heart failure  History:        Patient has prior history of Echocardiogram examinations,  most                 recent 07/09/2018. CHF, CAD, Pulmonary HTN; Risk                 Factors:Dyslipidemia and Sleep Apnea.  Sonographer:    Raquel Sarna Senior RDCS Referring Phys: [place]  1. Left ventricular ejection fraction, by estimation, is 20 to 25%. The left ventricle has severely decreased function. The basal-to-apical anterior, basal-to-mid septal (infero and anteroseptal), mid-to-apical lateral (infero and anterolateral) LV walls and apex appear akinetic. Indeterminate diastolic filling due to E-A fusion.  2. Right ventricular systolic function is severely reduced. The right ventricular size is mildly enlarged. There is mildly elevated pulmonary artery systolic pressure.  3. Left atrial size was severely dilated.  4. Right atrial size was severely dilated.  5. The mitral valve is grossly normal. At least moderate, eccentric, posterolaterally directed mitral valve regurgitation.  6. Tricuspid valve regurgitation is moderate due to lack of central coaptation of the leaflets.  7. The aortic valve is tricuspid. There is mild calcification of the aortic valve. There  is mild thickening of the aortic valve. Aortic valve regurgitation is trivial. Mild aortic valve sclerosis is present, with no evidence of aortic valve stenosis.  8. The inferior vena cava is dilated in size with <50% respiratory variability, suggesting right atrial pressure of 15 mmHg. Comparison(s): Compared to prior study on 07/07/18, there again is severely reduced LVEF with severely elevated filling pressures. FINDINGS  Left Ventricle: Left ventricular ejection fraction, by estimation, is 20 to 25%. The left ventricle has severely decreased function. The left ventricle demonstrates regional wall motion abnormalities. Definity contrast agent was given IV to delineate the left ventricular endocardial borders. The basal-to-apical anterior, basal-to-mid septal (infero and anteroseptal), mid-to-apical lateral (inferolateral and  anterolateral) LV walls and apex appear akinetic. The left ventricular internal cavity size was normal in size. There is no left ventricular hypertrophy. Indeterminate diastolic filling due to E-A fusion. Right Ventricle: The right ventricular size is mildly enlarged. No increase in right ventricular wall thickness. Right ventricular systolic function is severely reduced. There is mildly elevated pulmonary artery systolic pressure. The tricuspid regurgitant velocity is 2.54 m/s, and with an assumed right atrial pressure of 15 mmHg, the estimated right ventricular systolic pressure is 78.2 mmHg. Left Atrium: Left atrial size was severely dilated. Right Atrium: Right atrial size was severely dilated. Pericardium: There is no evidence of pericardial effusion. Mitral Valve: The mitral valve is grossly normal. There is mild thickening of the mitral valve leaflet(s). There is mild calcification of the mitral valve leaflet(s). Mild mitral annular calcification. Moderate mitral valve regurgitation. Tricuspid Valve: The tricuspid valve is normal in structure. Tricuspid valve regurgitation is moderate. Aortic Valve: The aortic valve is tricuspid. There is mild calcification of the aortic valve. There is mild thickening of the aortic valve. Aortic valve regurgitation is trivial. Mild aortic valve sclerosis is present, with no evidence of aortic valve stenosis. Pulmonic Valve: The pulmonic valve was normal in structure. Pulmonic valve regurgitation is trivial. Aorta: The aortic root and ascending aorta are structurally normal, with no evidence of dilitation. Venous: The inferior vena cava is dilated in size with less than 50% respiratory variability, suggesting right atrial pressure of 15 mmHg. IAS/Shunts: No atrial level shunt detected by color flow Doppler.  LEFT VENTRICLE PLAX 2D LVIDd:         5.20 cm LVIDs:         4.50 cm LV PW:         1.20 cm LV IVS:        1.10 cm LVOT diam:     2.10 cm LV SV:         51 LV SV Index:    23 LVOT Area:     3.46 cm  LV Volumes (MOD) LV vol d, MOD A4C: 183.0 ml LV vol s, MOD A4C: 139.0 ml LV SV MOD A4C:     183.0 ml RIGHT VENTRICLE RV S prime:     5.11 cm/s TAPSE (M-mode): 1.1 cm LEFT ATRIUM              Index       RIGHT ATRIUM           Index LA diam:        5.50 cm  2.45 cm/m  RA Area:     24.60 cm LA Vol (A2C):   79.6 ml  35.43 ml/m RA Volume:   73.30 ml  32.62 ml/m LA Vol (A4C):   107.0 ml 47.62 ml/m LA Biplane Vol: 93.4 ml  41.57  ml/m  AORTIC VALVE LVOT Vmax:   92.30 cm/s LVOT Vmean:  66.800 cm/s LVOT VTI:    0.147 m  AORTA Ao Root diam: 3.10 cm Ao Asc diam:  3.30 cm MITRAL VALVE                TRICUSPID VALVE MV Area (PHT): 4.80 cm     TR Peak grad:   25.8 mmHg MV Decel Time: 158 msec     TR Vmax:        254.00 cm/s MV E velocity: 132.00 cm/s MV A velocity: 41.00 cm/s   SHUNTS MV E/A ratio:  3.22         Systemic VTI:  0.15 m                             Systemic Diam: 2.10 cm Gwyndolyn Kaufman MD Electronically signed by Gwyndolyn Kaufman MD Signature Date/Time: 06/17/2020/12:50:54 PM    Final       Subjective: No issues overnight.  Discharge Exam: Vitals:   06/21/20 0454 06/21/20 0831  BP:  (!) 159/85  Pulse: 75 78  Resp: 20 20  Temp: 97.8 F (36.6 C)   SpO2: 100% 94%   Vitals:   06/20/20 1647 06/20/20 2141 06/21/20 0454 06/21/20 0831  BP: 101/76 111/77  (!) 159/85  Pulse: 81 77 75 78  Resp: 18 20 20 20   Temp: 97.7 F (36.5 C) 98.6 F (37 C) 97.8 F (36.6 C)   TempSrc: Oral Oral Oral   SpO2: 97% 100% 100% 94%  Weight:   101.3 kg   Height:        General: Pt is alert, awake, not in acute distress Cardiovascular: RRR, S1/S2 +, no rubs, no gallops Respiratory: CTA bilaterally, no wheezing, no rhonchi Abdominal: Soft, NT, ND, bowel sounds + Extremities: no edema, no cyanosis    The results of significant diagnostics from this hospitalization (including imaging, microbiology, ancillary and laboratory) are listed below for reference.      Microbiology: Recent Results (from the past 240 hour(s))  Resp Panel by RT-PCR (Flu A&B, Covid) Nasopharyngeal Swab     Status: None   Collection Time: 06/16/20  4:28 PM   Specimen: Nasopharyngeal Swab; Nasopharyngeal(NP) swabs in vial transport medium  Result Value Ref Range Status   SARS Coronavirus 2 by RT PCR NEGATIVE NEGATIVE Final    Comment: (NOTE) SARS-CoV-2 target nucleic acids are NOT DETECTED.  The SARS-CoV-2 RNA is generally detectable in upper respiratory specimens during the acute phase of infection. The lowest concentration of SARS-CoV-2 viral copies this assay can detect is 138 copies/mL. A negative result does not preclude SARS-Cov-2 infection and should not be used as the sole basis for treatment or other patient management decisions. A negative result may occur with  improper specimen collection/handling, submission of specimen other than nasopharyngeal swab, presence of viral mutation(s) within the areas targeted by this assay, and inadequate number of viral copies(<138 copies/mL). A negative result must be combined with clinical observations, patient history, and epidemiological information. The expected result is Negative.  Fact Sheet for Patients:  EntrepreneurPulse.com.au  Fact Sheet for Healthcare Providers:  IncredibleEmployment.be  This test is no t yet approved or cleared by the [place] FDA and  has been authorized for detection and/or diagnosis of SARS-CoV-2 by FDA under an Emergency Use Authorization (EUA). This EUA will remain  in effect (meaning this test can be used) for the duration of the  COVID-19 declaration under Section 564(b)(1) of the Act, 21 U.S.C.section 360bbb-3(b)(1), unless the authorization is terminated  or revoked sooner.       Influenza A by PCR NEGATIVE NEGATIVE Final   Influenza B by PCR NEGATIVE NEGATIVE Final    Comment: (NOTE) The Xpert Xpress SARS-CoV-2/FLU/RSV plus assay is  intended as an aid in the diagnosis of influenza from Nasopharyngeal swab specimens and should not be used as a sole basis for treatment. Nasal washings and aspirates are unacceptable for Xpert Xpress SARS-CoV-2/FLU/RSV testing.  Fact Sheet for Patients: EntrepreneurPulse.com.au  Fact Sheet for Healthcare Providers: IncredibleEmployment.be  This test is not yet approved or cleared by the [place] FDA and has been authorized for detection and/or diagnosis of SARS-CoV-2 by FDA under an Emergency Use Authorization (EUA). This EUA will remain in effect (meaning this test can be used) for the duration of the COVID-19 declaration under Section 564(b)(1) of the Act, 21 U.S.C. section 360bbb-3(b)(1), unless the authorization is terminated or revoked.  Performed at [facility], [place] [address] [place], [place] [address]      Labs: BNP (last 3 results) Recent Labs    06/16/20 1628  BNP 034.7*   Basic Metabolic Panel: Recent Labs  Lab 06/16/20 1628 06/16/20 2334 06/17/20 0232 06/18/20 0132 06/18/20 1136 06/19/20 0135 06/20/20 0208 06/21/20 0439  NA 142  --  140 139  --  137 136 137  K 3.0*  --  3.4* 3.3*  --  4.0 3.3* 3.9  CL 110  --  104 104  --  101 96* 102  CO2 20*  --  25 24  --  25 25 23   GLUCOSE 87  --  116* 97  --  112* 103* 96  BUN 18  --  16 13  --  9 15 19   CREATININE 1.44*   < > 1.45* 1.21  --  1.36* 1.49* 1.40*  CALCIUM 8.0*  --  8.9 9.0  --  9.8 9.8 9.7  MG 1.3*  --  1.9  --  1.9  --   --   --    < > = values in this interval not displayed.   Liver Function Tests: Recent Labs  Lab 06/16/20 1628  AST 25  ALT 17  ALKPHOS 70  BILITOT 1.1  PROT 5.9*  ALBUMIN 3.3*   Recent Labs  Lab 06/16/20 1628  LIPASE 23   No results for input(s): AMMONIA in the last 168 hours. CBC: Recent Labs  Lab 06/16/20 1628 06/16/20 2334 06/19/20 0135 06/20/20 0208 06/21/20 0439  WBC 7.4 7.2 7.2 6.5 4.9  NEUTROABS 4.6   --   --   --   --   HGB 11.6* 13.4 13.9 15.8 16.3  HCT 37.3* 39.8 40.0 48.1 49.5  MCV 94.7 88.1 86.6 87.9 87.6  PLT 204 228 222 230 246   Cardiac Enzymes: No results for input(s): CKTOTAL, CKMB, CKMBINDEX, TROPONINI in the last 168 hours. BNP: Invalid input(s): POCBNP CBG: No results for input(s): GLUCAP in the last 168 hours. D-Dimer No results for input(s): DDIMER in the last 72 hours. Hgb A1c No results for input(s): HGBA1C in the last 72 hours. Lipid Profile No results for input(s): CHOL, HDL, LDLCALC, TRIG, CHOLHDL, LDLDIRECT in the last 72 hours. Thyroid function studies No results for input(s): TSH, T4TOTAL, T3FREE, THYROIDAB in the last 72 hours.  Invalid input(s): FREET3 Anemia work up No results for input(s): VITAMINB12, FOLATE, FERRITIN, TIBC, IRON, RETICCTPCT in the last  72 hours. Urinalysis    Component Value Date/Time   COLORURINE YELLOW 06/17/2020 1836   APPEARANCEUR CLEAR 06/17/2020 1836   LABSPEC 1.025 06/17/2020 1836   PHURINE 6.0 06/17/2020 1836   GLUCOSEU NEGATIVE 06/17/2020 1836   HGBUR NEGATIVE 06/17/2020 1836   BILIRUBINUR NEGATIVE 06/17/2020 1836   BILIRUBINUR negative 03/15/2018 [place] 06/17/2020 1836   PROTEINUR 100 (A) 06/17/2020 1836   UROBILINOGEN negative (A) 03/15/2018 1749   NITRITE NEGATIVE 06/17/2020 1836   LEUKOCYTESUR NEGATIVE 06/17/2020 1836   Sepsis Labs Invalid input(s): PROCALCITONIN,  WBC,  LACTICIDVEN Microbiology Recent Results (from the past 240 hour(s))  Resp Panel by RT-PCR (Flu A&B, Covid) Nasopharyngeal Swab     Status: None   Collection Time: 06/16/20  4:28 PM   Specimen: Nasopharyngeal Swab; Nasopharyngeal(NP) swabs in vial transport medium  Result Value Ref Range Status   SARS Coronavirus 2 by RT PCR NEGATIVE NEGATIVE Final    Comment: (NOTE) SARS-CoV-2 target nucleic acids are NOT DETECTED.  The SARS-CoV-2 RNA is generally detectable in upper respiratory specimens during the acute phase of  infection. The lowest concentration of SARS-CoV-2 viral copies this assay can detect is 138 copies/mL. A negative result does not preclude SARS-Cov-2 infection and should not be used as the sole basis for treatment or other patient management decisions. A negative result may occur with  improper specimen collection/handling, submission of specimen other than nasopharyngeal swab, presence of viral mutation(s) within the areas targeted by this assay, and inadequate number of viral copies(<138 copies/mL). A negative result must be combined with clinical observations, patient history, and epidemiological information. The expected result is Negative.  Fact Sheet for Patients:  EntrepreneurPulse.com.au  Fact Sheet for Healthcare Providers:  IncredibleEmployment.be  This test is no t yet approved or cleared by the [place] FDA and  has been authorized for detection and/or diagnosis of SARS-CoV-2 by FDA under an Emergency Use Authorization (EUA). This EUA will remain  in effect (meaning this test can be used) for the duration of the COVID-19 declaration under Section 564(b)(1) of the Act, 21 U.S.C.section 360bbb-3(b)(1), unless the authorization is terminated  or revoked sooner.       Influenza A by PCR NEGATIVE NEGATIVE Final   Influenza B by PCR NEGATIVE NEGATIVE Final    Comment: (NOTE) The Xpert Xpress SARS-CoV-2/FLU/RSV plus assay is intended as an aid in the diagnosis of influenza from Nasopharyngeal swab specimens and should not be used as a sole basis for treatment. Nasal washings and aspirates are unacceptable for Xpert Xpress SARS-CoV-2/FLU/RSV testing.  Fact Sheet for Patients: EntrepreneurPulse.com.au  Fact Sheet for Healthcare Providers: IncredibleEmployment.be  This test is not yet approved or cleared by the [place] FDA and has been authorized for detection and/or diagnosis of SARS-CoV-2  by FDA under an Emergency Use Authorization (EUA). This EUA will remain in effect (meaning this test can be used) for the duration of the COVID-19 declaration under Section 564(b)(1) of the Act, 21 U.S.C. section 360bbb-3(b)(1), unless the authorization is terminated or revoked.  Performed at [facility], [place] [address], [place], [place] [address]      Time coordinating discharge: 35 minutes  SIGNED:   Cordelia Poche, MD [facility] 06/21/2020, 11:43 AM

## 2020-06-22 ENCOUNTER — Telehealth: Payer: Self-pay

## 2020-06-22 NOTE — Telephone Encounter (Signed)
Transition Care Management Follow-up Telephone Call  Date of discharge and from where:[facility] on 06/21/2020 How have you been since you were released from the hospital? Much better Any questions or concerns? No questions/concerns reported.  Items Reviewed: Did the pt receive and understand the discharge instructions provided? Stated that have the instructions and have no questions.  Medications obtained and verified? Stated that he has all of the medications and they have no questions.  Any new allergies since your discharge? None reported  Do you have support at home? Yes, spouse Other (ie: DME, Home Health, etc)       Functional Questionnaire: (I = Independent and D = Dependent) ADL's:  Independent.      Follow up appointments reviewed:    PCP Hospital f/u appt confirmed? [name] on 07/10/2020@ 11:30AM  Specialist Hospital f/u appt confirmed? None scheduled at this time   Are transportation arrangements needed? have public  transportation    If their condition worsens, is the pt aware to call  their PCP or go to the ED? Yes.Made pt aware if condition worsen or start experiencing rapid weight gain, chest pain, diff breathing, SOB, high fevers, or bleading to refer imediately to ED for further evaluation.   Was the patient provided with contact information for the PCP's office or ED? He has the phone number   Was the pt encouraged to call back with questions or concerns?yes

## 2020-06-22 NOTE — Telephone Encounter (Signed)
Entry in error

## 2020-06-24 ENCOUNTER — Encounter (HOSPITAL_COMMUNITY): Payer: Self-pay | Admitting: Internal Medicine

## 2020-06-27 ENCOUNTER — Encounter (HOSPITAL_COMMUNITY): Payer: Medicare Other

## 2020-06-27 ENCOUNTER — Telehealth: Payer: Self-pay
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
## 2020-06-27 NOTE — Telephone Encounter (Signed)
Call received from patient. He said that [facility] called him about the hospital bed and he does not want that. He explained that it is causing him anxiety to think about a hospital bed.  He wants a Sleep Number bed and said that medicaid will pay for it.  Instructed him to send the contact information for sleep number for this CM to inquire about coverage and what orders/information is needed. The patient said he has that information and will fax it  to this CM.  Christa See, LCSW present for the call.  The patient stated that he will be following up with [facility] for therapy and medication management and he already has an appointment scheduled

## 2020-06-28 ENCOUNTER — Telehealth: Payer: Self-pay | Admitting: Critical Care Medicine

## 2020-06-28 NOTE — Telephone Encounter (Signed)
Noted.  I doubt medicaid will pay for a luxury bed

## 2020-06-28 NOTE — Telephone Encounter (Signed)
Copied from [place]. Topic: General - Other >> Jun 27, 2020 11:51 AM Pawlus, Brayton Layman A wrote: Reason for CRM: Pt wanted to speak with the Education officer, museum. Please advise.

## 2020-07-11 ENCOUNTER — Encounter: Payer: Self-pay | Admitting: Critical Care Medicine

## 2020-07-11 ENCOUNTER — Other Ambulatory Visit: Payer: Self-pay

## 2020-07-11 ENCOUNTER — Ambulatory Visit: Payer: 59 | Attending: Critical Care Medicine | Admitting: Critical Care Medicine

## 2020-07-11 VITALS — BP 102/78 | HR 95 | Resp 16 | Wt 240.0 lb

## 2020-07-11 DIAGNOSIS — Z79899 Other long term (current) drug therapy: Secondary | ICD-10-CM | POA: Insufficient documentation

## 2020-07-11 DIAGNOSIS — I4892 Unspecified atrial flutter: Secondary | ICD-10-CM | POA: Diagnosis not present

## 2020-07-11 DIAGNOSIS — I10 Essential (primary) hypertension: Secondary | ICD-10-CM

## 2020-07-11 DIAGNOSIS — M545 Low back pain, unspecified: Secondary | ICD-10-CM | POA: Diagnosis not present

## 2020-07-11 DIAGNOSIS — F17210 Nicotine dependence, cigarettes, uncomplicated: Secondary | ICD-10-CM | POA: Insufficient documentation

## 2020-07-11 DIAGNOSIS — F319 Bipolar disorder, unspecified: Secondary | ICD-10-CM | POA: Insufficient documentation

## 2020-07-11 DIAGNOSIS — N182 Chronic kidney disease, stage 2 (mild): Secondary | ICD-10-CM

## 2020-07-11 DIAGNOSIS — G4733 Obstructive sleep apnea (adult) (pediatric): Secondary | ICD-10-CM

## 2020-07-11 DIAGNOSIS — N183 Chronic kidney disease, stage 3 unspecified: Secondary | ICD-10-CM | POA: Insufficient documentation

## 2020-07-11 DIAGNOSIS — Z7901 Long term (current) use of anticoagulants: Secondary | ICD-10-CM | POA: Diagnosis not present

## 2020-07-11 DIAGNOSIS — G8929 Other chronic pain: Secondary | ICD-10-CM | POA: Insufficient documentation

## 2020-07-11 DIAGNOSIS — I255 Ischemic cardiomyopathy: Secondary | ICD-10-CM | POA: Diagnosis not present

## 2020-07-11 DIAGNOSIS — I5042 Chronic combined systolic (congestive) and diastolic (congestive) heart failure: Secondary | ICD-10-CM | POA: Diagnosis present

## 2020-07-11 DIAGNOSIS — I130 Hypertensive heart and chronic kidney disease with heart failure and stage 1 through stage 4 chronic kidney disease, or unspecified chronic kidney disease: Secondary | ICD-10-CM | POA: Diagnosis not present

## 2020-07-11 DIAGNOSIS — N529 Male erectile dysfunction, unspecified: Secondary | ICD-10-CM | POA: Diagnosis not present

## 2020-07-11 DIAGNOSIS — J4520 Mild intermittent asthma, uncomplicated: Secondary | ICD-10-CM | POA: Insufficient documentation

## 2020-07-11 DIAGNOSIS — Z6832 Body mass index (BMI) 32.0-32.9, adult: Secondary | ICD-10-CM | POA: Diagnosis not present

## 2020-07-11 MED ORDER — ALBUTEROL SULFATE (2.5 MG/3ML) 0.083% IN NEBU: 2.5000 mg | INHALATION_SOLUTION | Freq: Four times a day (QID) | RESPIRATORY_TRACT | 1 refills | Status: DC | PRN

## 2020-07-11 NOTE — Assessment & Plan Note (Signed)
Obtain split-night sleep study

## 2020-07-11 NOTE — Assessment & Plan Note (Signed)
Ischemic cardiomyopathy systolic heart failure compensated  Continue as needed diuretics  Hold BiDil and hold beta-blocker for now  Obtain appointment at the advanced heart failure clinic

## 2020-07-11 NOTE — Assessment & Plan Note (Signed)
Stable off BiDil and beta-blocker

## 2020-07-11 NOTE — Assessment & Plan Note (Signed)
Follow-up renal function 

## 2020-07-11 NOTE — Assessment & Plan Note (Signed)
Patient encouraged to keep mental health appointment

## 2020-07-11 NOTE — Patient Instructions (Signed)
Nebulizer machine was issued Albuterol medication sent to our pharmacy for the nebulizer  Sleep study will be ordered  No change in medications  Referral to the advanced heart failure clinic was made  Keep your mental health appointment follow-up in April  Keep your echocardiogram appointment this month  Return to see Dr. [name] 1 month  Avoid sexual intercourse for now to see cardiology  Follow a heart healthy diet as outlined below  Stop by the lab on the way out for metabolic panel and blood counts   [email or web address]">  DASH Eating Plan DASH stands for Dietary Approaches to Stop Hypertension. The DASH eating plan is a healthy eating plan that has been shown to:  Reduce high blood pressure (hypertension).  Reduce your risk for type 2 diabetes, heart disease, and stroke.  Help with weight loss. What are tips for following this plan? Reading food labels  Check food labels for the amount of salt (sodium) per serving. Choose foods with less than 5 percent of the Daily Value of sodium. Generally, foods with less than 300 milligrams (mg) of sodium per serving fit into this eating plan.  To find whole grains, look for the word "whole" as the first word in the ingredient list. Shopping  Buy products labeled as "low-sodium" or "no salt added."  Buy fresh foods. Avoid canned foods and pre-made or frozen meals. Cooking  Avoid adding salt when cooking. Use salt-free seasonings or herbs instead of table salt or sea salt. Check with your health care provider or pharmacist before using salt substitutes.  Do not fry foods. Cook foods using healthy methods such as baking, boiling, grilling, roasting, and broiling instead.  Cook with heart-healthy oils, such as olive, canola, avocado, soybean, or sunflower oil. Meal planning  Eat a balanced diet that includes: ? 4 or more servings of fruits and 4 or more servings of vegetables each  day. Try to fill one-half of your plate with fruits and vegetables. ? 6-8 servings of whole grains each day. ? Less than 6 oz (170 g) of lean meat, poultry, or fish each day. A 3-oz (85-g) serving of meat is about the same size as a deck of cards. One egg equals 1 oz (28 g). ? 2-3 servings of low-fat dairy each day. One serving is 1 cup (237 mL). ? 1 serving of nuts, seeds, or beans 5 times each week. ? 2-3 servings of heart-healthy fats. Healthy fats called omega-3 fatty acids are found in foods such as walnuts, flaxseeds, fortified milks, and eggs. These fats are also found in cold-water fish, such as sardines, salmon, and mackerel.  Limit how much you eat of: ? Canned or prepackaged foods. ? Food that is high in trans fat, such as some fried foods. ? Food that is high in saturated fat, such as fatty meat. ? Desserts and other sweets, sugary drinks, and other foods with added sugar. ? Full-fat dairy products.  Do not salt foods before eating.  Do not eat more than 4 egg yolks a week.  Try to eat at least 2 vegetarian meals a week.  Eat more home-cooked food and less restaurant, buffet, and fast food.   Lifestyle  When eating at a restaurant, ask that your food be prepared with less salt or no salt, if possible.  If you drink alcohol: ? Limit how much you use to:  0-1 drink a day for women who are not pregnant.  0-2 drinks a day for men. ? Be aware of  how much alcohol is in your drink. In the [place] one drink equals one 12 oz bottle of beer (355 mL), one 5 oz glass of wine (148 mL), or one 1 oz glass of hard liquor (44 mL). General information  Avoid eating more than 2,300 mg of salt a day. If you have hypertension, you may need to reduce your sodium intake to 1,500 mg a day.  Work with your health care provider to maintain a healthy body weight or to lose weight. Ask what an ideal weight is for you.  Get at least 30 minutes of exercise that causes your heart to beat faster  (aerobic exercise) most days of the week. Activities may include walking, swimming, or biking.  Work with your health care provider or dietitian to adjust your eating plan to your individual calorie needs. What foods should I eat? Fruits All fresh, dried, or frozen fruit. Canned fruit in natural juice (without added sugar). Vegetables Fresh or frozen vegetables (raw, steamed, roasted, or grilled). Low-sodium or reduced-sodium tomato and vegetable juice. Low-sodium or reduced-sodium tomato sauce and tomato paste. Low-sodium or reduced-sodium canned vegetables. Grains Whole-grain or whole-wheat bread. Whole-grain or whole-wheat pasta. Brown rice. [place]. Bulgur. Whole-grain and low-sodium cereals. Pita bread. Low-fat, low-sodium crackers. Whole-wheat flour tortillas. Meats and other proteins Skinless chicken or [place]. Ground chicken or [place]. Pork with fat trimmed off. Fish and seafood. Egg whites. Dried beans, peas, or lentils. Unsalted nuts, nut butters, and seeds. Unsalted canned beans. Lean cuts of beef with fat trimmed off. Low-sodium, lean precooked or cured meat, such as sausages or meat loaves. Dairy Low-fat (1%) or fat-free (skim) milk. Reduced-fat, low-fat, or fat-free cheeses. Nonfat, low-sodium ricotta or cottage cheese. Low-fat or nonfat yogurt. Low-fat, low-sodium cheese. Fats and oils Soft margarine without trans fats. Vegetable oil. Reduced-fat, low-fat, or light mayonnaise and salad dressings (reduced-sodium). Canola, safflower, olive, avocado, soybean, and sunflower oils. Avocado. Seasonings and condiments Herbs. Spices. Seasoning mixes without salt. Other foods Unsalted popcorn and pretzels. Fat-free sweets. The items listed above may not be a complete list of foods and beverages you can eat. Contact a dietitian for more information. What foods should I avoid? Fruits Canned fruit in a light or heavy syrup. Fried fruit. Fruit in cream or butter  sauce. Vegetables Creamed or fried vegetables. Vegetables in a cheese sauce. Regular canned vegetables (not low-sodium or reduced-sodium). Regular canned tomato sauce and paste (not low-sodium or reduced-sodium). Regular tomato and vegetable juice (not low-sodium or reduced-sodium). Angie Fava. Olives. Grains Baked goods made with fat, such as croissants, muffins, or some breads. Dry pasta or rice meal packs. Meats and other proteins Fatty cuts of meat. Ribs. Fried meat. Berniece Salines. Bologna, salami, and other precooked or cured meats, such as sausages or meat loaves. Fat from the back of a pig (fatback). Bratwurst. Salted nuts and seeds. Canned beans with added salt. Canned or smoked fish. Whole eggs or egg yolks. Chicken or [place] with skin. Dairy Whole or 2% milk, cream, and half-and-half. Whole or full-fat cream cheese. Whole-fat or sweetened yogurt. Full-fat cheese. Nondairy creamers. Whipped toppings. Processed cheese and cheese spreads. Fats and oils Butter. Stick margarine. Lard. Shortening. Ghee. Bacon fat. Tropical oils, such as coconut, palm kernel, or palm oil. Seasonings and condiments Onion salt, garlic salt, seasoned salt, table salt, and sea salt. Worcestershire sauce. Tartar sauce. Barbecue sauce. Teriyaki sauce. Soy sauce, including reduced-sodium. Steak sauce. Canned and packaged gravies. Fish sauce. Oyster sauce. Cocktail sauce. Store-bought horseradish. Ketchup. Mustard. Meat flavorings and  tenderizers. Bouillon cubes. Hot sauces. Pre-made or packaged marinades. Pre-made or packaged taco seasonings. Relishes. Regular salad dressings. Other foods Salted popcorn and pretzels. The items listed above may not be a complete list of foods and beverages you should avoid. Contact a dietitian for more information. Where to find more information  National Heart, Lung, and Blood Institute: https://wilson-eaton.com/  American Heart Association: www.heart.org  Academy of Nutrition and Dietetics:  www.eatright.[place]: www.kidney.org Summary  The DASH eating plan is a healthy eating plan that has been shown to reduce high blood pressure (hypertension). It may also reduce your risk for type 2 diabetes, heart disease, and stroke.  When on the DASH eating plan, aim to eat more fresh fruits and vegetables, whole grains, lean proteins, low-fat dairy, and heart-healthy fats.  With the DASH eating plan, you should limit salt (sodium) intake to 2,300 mg a day. If you have hypertension, you may need to reduce your sodium intake to 1,500 mg a day.  Work with your health care provider or dietitian to adjust your eating plan to your individual calorie needs. This information is not intended to replace advice given to you by your health care provider. Make sure you discuss any questions you have with your health care provider. Document Revised: 03/25/2019 Document Reviewed: 03/25/2019 Elsevier Patient Education  2021 [employer].

## 2020-07-11 NOTE — Assessment & Plan Note (Signed)
Currently in sinus rhythm Continue amiodarone
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
## 2020-07-11 NOTE — Assessment & Plan Note (Signed)
Patient still having erectile dysfunction I am hesitant to treat this further asked the patient to hold off having any sexual intercourse till he is cleared by cardiology

## 2020-07-11 NOTE — Assessment & Plan Note (Signed)
As per cardiomyopathy assessment

## 2020-07-11 NOTE — Assessment & Plan Note (Signed)
Obtain home nebulizer with as needed albuterol

## 2020-07-11 NOTE — Progress Notes (Signed)
F/u on DME- bed

## 2020-07-12 LAB — COMPREHENSIVE METABOLIC PANEL
ALT: 16 IU/L (ref 0–44)
AST: 25 IU/L (ref 0–40)
Albumin/Globulin Ratio: 1.5 (ref 1.2–2.2)
Albumin: 4.8 g/dL (ref 3.8–4.9)
Alkaline Phosphatase: 113 IU/L (ref 44–121)
BUN/Creatinine Ratio: 9 (ref 9–20)
BUN: 13 mg/dL (ref 6–24)
Bilirubin Total: 1 mg/dL (ref 0.0–1.2)
CO2: 24 mmol/L (ref 20–29)
Calcium: 9.9 mg/dL (ref 8.7–10.2)
Chloride: 101 mmol/L (ref 96–106)
Creatinine, Ser: 1.4 mg/dL — ABNORMAL HIGH (ref 0.76–1.27)
Globulin, Total: 3.1 g/dL (ref 1.5–4.5)
Glucose: 87 mg/dL (ref 65–99)
Potassium: 3.7 mmol/L (ref 3.5–5.2)
Sodium: 143 mmol/L (ref 134–144)
Total Protein: 7.9 g/dL (ref 6.0–8.5)
eGFR: 59 mL/min/{1.73_m2} — ABNORMAL LOW (ref >59)

## 2020-07-12 LAB — CBC WITH DIFFERENTIAL/PLATELET
Basophils Absolute: 0.1 10*3/uL (ref 0.0–0.2)
Basos: 1 %
EOS (ABSOLUTE): 0.2 10*3/uL (ref 0.0–0.4)
Eos: 5 %
Hematocrit: 46 % (ref 37.5–51.0)
Hemoglobin: 15.1 g/dL (ref 13.0–17.7)
Immature Grans (Abs): 0 10*3/uL (ref 0.0–0.1)
Immature Granulocytes: 0 %
Lymphocytes Absolute: 2 10*3/uL (ref 0.7–3.1)
Lymphs: 39 %
MCH: 29 pg (ref 26.6–33.0)
MCHC: 32.8 g/dL (ref 31.5–35.7)
MCV: 88 fL (ref 79–97)
Monocytes Absolute: 0.6 10*3/uL (ref 0.1–0.9)
Monocytes: 11 %
Neutrophils Absolute: 2.3 10*3/uL (ref 1.4–7.0)
Neutrophils: 44 %
Platelets: 291 10*3/uL (ref 150–450)
RBC: 5.21 x10E6/uL (ref 4.14–5.80)
RDW: 14.7 % (ref 11.6–15.4)
WBC: 5.2 10*3/uL (ref 3.4–10.8)

## 2020-07-13 ENCOUNTER — Encounter: Payer: Self-pay | Admitting: *Deleted

## 2020-08-01 ENCOUNTER — Encounter (HOSPITAL_COMMUNITY): Payer: Self-pay | Admitting: Cardiology

## 2020-08-01 ENCOUNTER — Encounter (HOSPITAL_COMMUNITY): Payer: Self-pay | Admitting: Critical Care Medicine

## 2020-08-01 ENCOUNTER — Other Ambulatory Visit (HOSPITAL_COMMUNITY): Payer: Medicaid Other

## 2020-08-01 NOTE — Progress Notes (Unsigned)
Patient ID: Cody Guzman, male   DOB: [birth year], 55 y.o.   MRN: [id number]   Verified appointment "no show" status with Aaliyah at 9:45am.

## 2020-08-06 ENCOUNTER — Telehealth (HOSPITAL_COMMUNITY): Payer: Medicare Other | Admitting: Psychiatry

## 2020-08-22 ENCOUNTER — Telehealth: Payer: Self-pay | Admitting: Critical Care Medicine

## 2020-08-22 NOTE — Telephone Encounter (Signed)
Returned pt wife call and made aware that we will need to get a medical release from the jail with pt signature. Pt wife states she understands. Made pt wife that I will contact the dentition center and have them fax one over. Spoke with Anderson Malta in medical and per Anderson Malta they faxed a medical release a couple days ago. Confirmed fax number and per Anderson Malta she will fax release again. Once we get release I will have front desk release information and fax back

## 2020-08-22 NOTE — Telephone Encounter (Signed)
Patient's wife called to request a list of patient's medications to be sent to corrections where the patient is incarcerated.  Patient's wife would like a list for the prison because they said they needed verification as to what meds. The patient was taking.  Please advise and call wife to speak with further at [phone]

## 2020-08-24 NOTE — Telephone Encounter (Signed)
Pts wife calling stating that she spoke with Anderson Malta at the detention center. She states that she was advised by Anderson Malta that they have not received any of the pts medical records. Pts wife is very concerned and wants to make sure that the pt is being taken care of. She is requesting to have a call back with an update. Please advise.      [phone]

## 2020-08-26 ENCOUNTER — Emergency Department (HOSPITAL_COMMUNITY)

## 2020-08-26 ENCOUNTER — Inpatient Hospital Stay (HOSPITAL_COMMUNITY)
Admission: EM | Admit: 2020-08-26 | Discharge: 2020-08-31 | DRG: 065 | Attending: Internal Medicine | Admitting: Internal Medicine

## 2020-08-26 ENCOUNTER — Encounter (HOSPITAL_COMMUNITY): Payer: Self-pay

## 2020-08-26 ENCOUNTER — Other Ambulatory Visit: Payer: Self-pay

## 2020-08-26 DIAGNOSIS — Z20822 Contact with and (suspected) exposure to covid-19: Secondary | ICD-10-CM | POA: Diagnosis present

## 2020-08-26 DIAGNOSIS — E785 Hyperlipidemia, unspecified: Secondary | ICD-10-CM | POA: Diagnosis present

## 2020-08-26 DIAGNOSIS — R479 Unspecified speech disturbances: Secondary | ICD-10-CM | POA: Diagnosis present

## 2020-08-26 DIAGNOSIS — I69328 Other speech and language deficits following cerebral infarction: Secondary | ICD-10-CM | POA: Diagnosis present

## 2020-08-26 DIAGNOSIS — I639 Cerebral infarction, unspecified: Secondary | ICD-10-CM | POA: Diagnosis present

## 2020-08-26 DIAGNOSIS — J4520 Mild intermittent asthma, uncomplicated: Secondary | ICD-10-CM | POA: Diagnosis present

## 2020-08-26 DIAGNOSIS — R778 Other specified abnormalities of plasma proteins: Secondary | ICD-10-CM | POA: Diagnosis present

## 2020-08-26 DIAGNOSIS — I480 Paroxysmal atrial fibrillation: Secondary | ICD-10-CM | POA: Diagnosis present

## 2020-08-26 DIAGNOSIS — I5022 Chronic systolic (congestive) heart failure: Secondary | ICD-10-CM | POA: Diagnosis present

## 2020-08-26 DIAGNOSIS — M792 Neuralgia and neuritis, unspecified: Secondary | ICD-10-CM

## 2020-08-26 DIAGNOSIS — G4733 Obstructive sleep apnea (adult) (pediatric): Secondary | ICD-10-CM | POA: Diagnosis present

## 2020-08-26 DIAGNOSIS — I420 Dilated cardiomyopathy: Secondary | ICD-10-CM | POA: Diagnosis present

## 2020-08-26 DIAGNOSIS — Z841 Family history of disorders of kidney and ureter: Secondary | ICD-10-CM

## 2020-08-26 DIAGNOSIS — M5442 Lumbago with sciatica, left side: Secondary | ICD-10-CM

## 2020-08-26 DIAGNOSIS — I429 Cardiomyopathy, unspecified: Secondary | ICD-10-CM

## 2020-08-26 DIAGNOSIS — R079 Chest pain, unspecified: Secondary | ICD-10-CM

## 2020-08-26 DIAGNOSIS — I248 Other forms of acute ischemic heart disease: Secondary | ICD-10-CM | POA: Diagnosis present

## 2020-08-26 DIAGNOSIS — R1310 Dysphagia, unspecified: Secondary | ICD-10-CM | POA: Diagnosis present

## 2020-08-26 DIAGNOSIS — Z8679 Personal history of other diseases of the circulatory system: Secondary | ICD-10-CM

## 2020-08-26 DIAGNOSIS — Z955 Presence of coronary angioplasty implant and graft: Secondary | ICD-10-CM

## 2020-08-26 DIAGNOSIS — Z7901 Long term (current) use of anticoagulants: Secondary | ICD-10-CM

## 2020-08-26 DIAGNOSIS — I509 Heart failure, unspecified: Secondary | ICD-10-CM

## 2020-08-26 DIAGNOSIS — I6389 Other cerebral infarction: Secondary | ICD-10-CM | POA: Diagnosis present

## 2020-08-26 DIAGNOSIS — G8929 Other chronic pain: Secondary | ICD-10-CM | POA: Diagnosis present

## 2020-08-26 DIAGNOSIS — I472 Ventricular tachycardia, unspecified: Secondary | ICD-10-CM

## 2020-08-26 DIAGNOSIS — I081 Rheumatic disorders of both mitral and tricuspid valves: Secondary | ICD-10-CM | POA: Diagnosis present

## 2020-08-26 DIAGNOSIS — R17 Unspecified jaundice: Secondary | ICD-10-CM | POA: Diagnosis present

## 2020-08-26 DIAGNOSIS — Z8572 Personal history of non-Hodgkin lymphomas: Secondary | ICD-10-CM

## 2020-08-26 DIAGNOSIS — M1990 Unspecified osteoarthritis, unspecified site: Secondary | ICD-10-CM | POA: Diagnosis present

## 2020-08-26 DIAGNOSIS — R7989 Other specified abnormal findings of blood chemistry: Secondary | ICD-10-CM | POA: Diagnosis present

## 2020-08-26 DIAGNOSIS — Z8249 Family history of ischemic heart disease and other diseases of the circulatory system: Secondary | ICD-10-CM

## 2020-08-26 DIAGNOSIS — I25119 Atherosclerotic heart disease of native coronary artery with unspecified angina pectoris: Secondary | ICD-10-CM | POA: Diagnosis present

## 2020-08-26 DIAGNOSIS — I130 Hypertensive heart and chronic kidney disease with heart failure and stage 1 through stage 4 chronic kidney disease, or unspecified chronic kidney disease: Secondary | ICD-10-CM | POA: Diagnosis present

## 2020-08-26 DIAGNOSIS — Z79899 Other long term (current) drug therapy: Secondary | ICD-10-CM

## 2020-08-26 DIAGNOSIS — N183 Chronic kidney disease, stage 3 unspecified: Secondary | ICD-10-CM | POA: Diagnosis present

## 2020-08-26 DIAGNOSIS — I4892 Unspecified atrial flutter: Secondary | ICD-10-CM | POA: Diagnosis present

## 2020-08-26 DIAGNOSIS — M5441 Lumbago with sciatica, right side: Secondary | ICD-10-CM | POA: Diagnosis present

## 2020-08-26 DIAGNOSIS — R4701 Aphasia: Secondary | ICD-10-CM | POA: Diagnosis present

## 2020-08-26 DIAGNOSIS — E876 Hypokalemia: Secondary | ICD-10-CM | POA: Diagnosis present

## 2020-08-26 DIAGNOSIS — I272 Pulmonary hypertension, unspecified: Secondary | ICD-10-CM | POA: Diagnosis present

## 2020-08-26 DIAGNOSIS — I252 Old myocardial infarction: Secondary | ICD-10-CM

## 2020-08-26 DIAGNOSIS — R200 Anesthesia of skin: Secondary | ICD-10-CM

## 2020-08-26 DIAGNOSIS — I6340 Cerebral infarction due to embolism of unspecified cerebral artery: Principal | ICD-10-CM | POA: Diagnosis present

## 2020-08-26 DIAGNOSIS — R471 Dysarthria and anarthria: Secondary | ICD-10-CM | POA: Diagnosis present

## 2020-08-26 DIAGNOSIS — R29810 Facial weakness: Secondary | ICD-10-CM | POA: Diagnosis present

## 2020-08-26 DIAGNOSIS — Z818 Family history of other mental and behavioral disorders: Secondary | ICD-10-CM

## 2020-08-26 DIAGNOSIS — F419 Anxiety disorder, unspecified: Secondary | ICD-10-CM | POA: Diagnosis present

## 2020-08-26 DIAGNOSIS — N182 Chronic kidney disease, stage 2 (mild): Secondary | ICD-10-CM | POA: Diagnosis present

## 2020-08-26 DIAGNOSIS — F319 Bipolar disorder, unspecified: Secondary | ICD-10-CM | POA: Diagnosis present

## 2020-08-26 DIAGNOSIS — F17210 Nicotine dependence, cigarettes, uncomplicated: Secondary | ICD-10-CM | POA: Diagnosis present

## 2020-08-26 DIAGNOSIS — I493 Ventricular premature depolarization: Secondary | ICD-10-CM | POA: Diagnosis present

## 2020-08-26 DIAGNOSIS — I10 Essential (primary) hypertension: Secondary | ICD-10-CM

## 2020-08-26 DIAGNOSIS — I255 Ischemic cardiomyopathy: Secondary | ICD-10-CM | POA: Diagnosis present

## 2020-08-26 LAB — CBC WITH DIFFERENTIAL/PLATELET
Abs Immature Granulocytes: 0.02 10*3/uL (ref 0.00–0.07)
Basophils Absolute: 0.1 10*3/uL (ref 0.0–0.1)
Basophils Relative: 1 %
Eosinophils Absolute: 0.1 10*3/uL (ref 0.0–0.5)
Eosinophils Relative: 2 %
HCT: 41.8 % (ref 39.0–52.0)
Hemoglobin: 13.9 g/dL (ref 13.0–17.0)
Immature Granulocytes: 0 %
Lymphocytes Relative: 37 %
Lymphs Abs: 2.7 10*3/uL (ref 0.7–4.0)
MCH: 30.1 pg (ref 26.0–34.0)
MCHC: 33.3 g/dL (ref 30.0–36.0)
MCV: 90.5 fL (ref 80.0–100.0)
Monocytes Absolute: 0.7 10*3/uL (ref 0.1–1.0)
Monocytes Relative: 10 %
Neutro Abs: 3.7 10*3/uL (ref 1.7–7.7)
Neutrophils Relative %: 50 %
Platelets: 253 10*3/uL (ref 150–400)
RBC: 4.62 MIL/uL (ref 4.22–5.81)
RDW: 15.7 % — ABNORMAL HIGH (ref 11.5–15.5)
WBC: 7.3 10*3/uL (ref 4.0–10.5)
nRBC: 0 % (ref 0.0–0.2)
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
## 2020-08-27 ENCOUNTER — Observation Stay (HOSPITAL_COMMUNITY)

## 2020-08-27 ENCOUNTER — Telehealth: Payer: Self-pay | Admitting: Critical Care Medicine

## 2020-08-27 ENCOUNTER — Encounter (HOSPITAL_COMMUNITY): Payer: Self-pay | Admitting: Family Medicine

## 2020-08-27 DIAGNOSIS — F17210 Nicotine dependence, cigarettes, uncomplicated: Secondary | ICD-10-CM | POA: Diagnosis present

## 2020-08-27 DIAGNOSIS — I6389 Other cerebral infarction: Secondary | ICD-10-CM

## 2020-08-27 DIAGNOSIS — I472 Ventricular tachycardia, unspecified: Secondary | ICD-10-CM

## 2020-08-27 DIAGNOSIS — I255 Ischemic cardiomyopathy: Secondary | ICD-10-CM

## 2020-08-27 DIAGNOSIS — I6310 Cerebral infarction due to embolism of unspecified precerebral artery: Secondary | ICD-10-CM | POA: Diagnosis not present

## 2020-08-27 DIAGNOSIS — I130 Hypertensive heart and chronic kidney disease with heart failure and stage 1 through stage 4 chronic kidney disease, or unspecified chronic kidney disease: Secondary | ICD-10-CM | POA: Diagnosis present

## 2020-08-27 DIAGNOSIS — F319 Bipolar disorder, unspecified: Secondary | ICD-10-CM

## 2020-08-27 DIAGNOSIS — M1990 Unspecified osteoarthritis, unspecified site: Secondary | ICD-10-CM | POA: Diagnosis present

## 2020-08-27 DIAGNOSIS — I483 Typical atrial flutter: Secondary | ICD-10-CM | POA: Diagnosis not present

## 2020-08-27 DIAGNOSIS — I25119 Atherosclerotic heart disease of native coronary artery with unspecified angina pectoris: Secondary | ICD-10-CM | POA: Diagnosis present

## 2020-08-27 DIAGNOSIS — R479 Unspecified speech disturbances: Secondary | ICD-10-CM | POA: Diagnosis present

## 2020-08-27 DIAGNOSIS — I248 Other forms of acute ischemic heart disease: Secondary | ICD-10-CM | POA: Diagnosis present

## 2020-08-27 DIAGNOSIS — I69328 Other speech and language deficits following cerebral infarction: Secondary | ICD-10-CM | POA: Diagnosis present

## 2020-08-27 DIAGNOSIS — R079 Chest pain, unspecified: Secondary | ICD-10-CM | POA: Diagnosis present

## 2020-08-27 DIAGNOSIS — G8929 Other chronic pain: Secondary | ICD-10-CM | POA: Diagnosis present

## 2020-08-27 DIAGNOSIS — E876 Hypokalemia: Secondary | ICD-10-CM | POA: Diagnosis present

## 2020-08-27 DIAGNOSIS — J4520 Mild intermittent asthma, uncomplicated: Secondary | ICD-10-CM | POA: Diagnosis present

## 2020-08-27 DIAGNOSIS — Z20822 Contact with and (suspected) exposure to covid-19: Secondary | ICD-10-CM | POA: Diagnosis present

## 2020-08-27 DIAGNOSIS — I6340 Cerebral infarction due to embolism of unspecified cerebral artery: Secondary | ICD-10-CM | POA: Diagnosis present

## 2020-08-27 DIAGNOSIS — I480 Paroxysmal atrial fibrillation: Secondary | ICD-10-CM | POA: Diagnosis not present

## 2020-08-27 DIAGNOSIS — I493 Ventricular premature depolarization: Secondary | ICD-10-CM | POA: Diagnosis present

## 2020-08-27 DIAGNOSIS — I420 Dilated cardiomyopathy: Secondary | ICD-10-CM | POA: Diagnosis present

## 2020-08-27 DIAGNOSIS — I484 Atypical atrial flutter: Secondary | ICD-10-CM | POA: Diagnosis not present

## 2020-08-27 DIAGNOSIS — I4892 Unspecified atrial flutter: Secondary | ICD-10-CM

## 2020-08-27 DIAGNOSIS — R17 Unspecified jaundice: Secondary | ICD-10-CM | POA: Diagnosis present

## 2020-08-27 DIAGNOSIS — I639 Cerebral infarction, unspecified: Secondary | ICD-10-CM | POA: Diagnosis present

## 2020-08-27 DIAGNOSIS — G4733 Obstructive sleep apnea (adult) (pediatric): Secondary | ICD-10-CM | POA: Diagnosis present

## 2020-08-27 DIAGNOSIS — I272 Pulmonary hypertension, unspecified: Secondary | ICD-10-CM | POA: Diagnosis present

## 2020-08-27 DIAGNOSIS — I5022 Chronic systolic (congestive) heart failure: Secondary | ICD-10-CM | POA: Diagnosis present

## 2020-08-27 DIAGNOSIS — N182 Chronic kidney disease, stage 2 (mild): Secondary | ICD-10-CM

## 2020-08-27 DIAGNOSIS — N183 Chronic kidney disease, stage 3 unspecified: Secondary | ICD-10-CM | POA: Diagnosis present

## 2020-08-27 DIAGNOSIS — R4701 Aphasia: Secondary | ICD-10-CM | POA: Diagnosis present

## 2020-08-27 DIAGNOSIS — M5441 Lumbago with sciatica, right side: Secondary | ICD-10-CM | POA: Diagnosis present

## 2020-08-27 DIAGNOSIS — E785 Hyperlipidemia, unspecified: Secondary | ICD-10-CM | POA: Diagnosis present

## 2020-08-27 DIAGNOSIS — R778 Other specified abnormalities of plasma proteins: Secondary | ICD-10-CM

## 2020-08-27 LAB — COMPREHENSIVE METABOLIC PANEL
ALT: 15 U/L (ref 0–44)
ALT: 14 U/L (ref 0–44)
AST: 21 U/L (ref 15–41)
AST: 19 U/L (ref 15–41)
Albumin: 4 g/dL (ref 3.5–5.0)
Albumin: 3.7 g/dL (ref 3.5–5.0)
Alkaline Phosphatase: 76 U/L (ref 38–126)
Alkaline Phosphatase: 66 U/L (ref 38–126)
Anion gap: 10 (ref 5–15)
Anion gap: 10 (ref 5–15)
BUN: 9 mg/dL (ref 6–20)
BUN: 9 mg/dL (ref 6–20)
CO2: 23 mmol/L (ref 22–32)
CO2: 23 mmol/L (ref 22–32)
Calcium: 9.2 mg/dL (ref 8.9–10.3)
Calcium: 9.4 mg/dL (ref 8.9–10.3)
Chloride: 107 mmol/L (ref 98–111)
Chloride: 106 mmol/L (ref 98–111)
Creatinine, Ser: 1.22 mg/dL (ref 0.61–1.24)
Creatinine, Ser: 1.26 mg/dL — ABNORMAL HIGH (ref 0.61–1.24)
GFR, Estimated: >60 mL/min (ref >60)
GFR, Estimated: >60 mL/min (ref >60)
Glucose, Bld: 110 mg/dL — ABNORMAL HIGH (ref 70–99)
Glucose, Bld: 126 mg/dL — ABNORMAL HIGH (ref 70–99)
Potassium: 2.8 mmol/L — ABNORMAL LOW (ref 3.5–5.1)
Potassium: 3.4 mmol/L — ABNORMAL LOW (ref 3.5–5.1)
Sodium: 140 mmol/L (ref 135–145)
Sodium: 139 mmol/L (ref 135–145)
Total Bilirubin: 2.6 mg/dL — ABNORMAL HIGH (ref 0.3–1.2)
Total Bilirubin: 2.9 mg/dL — ABNORMAL HIGH (ref 0.3–1.2)
Total Protein: 7.4 g/dL (ref 6.5–8.1)
Total Protein: 7 g/dL (ref 6.5–8.1)

## 2020-08-27 LAB — ECHOCARDIOGRAM LIMITED
Height: 68 in
MV M vel: 3.94 m/s
MV Peak grad: 62.1 mmHg
Radius: 0.5 cm
S' Lateral: 5.6 cm
Weight: 3594.38 oz

## 2020-08-27 LAB — HEMOGLOBIN A1C
Hgb A1c MFr Bld: 5.9 % — ABNORMAL HIGH (ref 4.8–5.6)
Mean Plasma Glucose: 122.63 mg/dL

## 2020-08-27 LAB — CBC
HCT: 39.9 % (ref 39.0–52.0)
Hemoglobin: 13.4 g/dL (ref 13.0–17.0)
MCH: 29.9 pg (ref 26.0–34.0)
MCHC: 33.6 g/dL (ref 30.0–36.0)
MCV: 89.1 fL (ref 80.0–100.0)
Platelets: 213 10*3/uL (ref 150–400)
RBC: 4.48 MIL/uL (ref 4.22–5.81)
RDW: 15.8 % — ABNORMAL HIGH (ref 11.5–15.5)
WBC: 5.9 10*3/uL (ref 4.0–10.5)
nRBC: 0 % (ref 0.0–0.2)

## 2020-08-27 LAB — RESP PANEL BY RT-PCR (FLU A&B, COVID) ARPGX2
Influenza A by PCR: NEGATIVE
Influenza B by PCR: NEGATIVE
SARS Coronavirus 2 by RT PCR: NEGATIVE

## 2020-08-27 LAB — LIPID PANEL
Cholesterol: 168 mg/dL (ref 0–200)
HDL: 36 mg/dL — ABNORMAL LOW (ref >40)
LDL Cholesterol: 120 mg/dL — ABNORMAL HIGH (ref 0–99)
Total CHOL/HDL Ratio: 4.7 RATIO
Triglycerides: 59 mg/dL (ref <150)
VLDL: 12 mg/dL (ref 0–40)

## 2020-08-27 LAB — MAGNESIUM: Magnesium: 1.4 mg/dL — ABNORMAL LOW (ref 1.7–2.4)

## 2020-08-27 LAB — POTASSIUM: Potassium: 3.4 mmol/L — ABNORMAL LOW (ref 3.5–5.1)

## 2020-08-27 LAB — BRAIN NATRIURETIC PEPTIDE: B Natriuretic Peptide: 973.8 pg/mL — ABNORMAL HIGH (ref 0.0–100.0)

## 2020-08-27 LAB — TSH: TSH: 3.923 u[IU]/mL (ref 0.350–4.500)

## 2020-08-27 LAB — PROTIME-INR
INR: 1.2 (ref 0.8–1.2)
Prothrombin Time: 15.6 seconds — ABNORMAL HIGH (ref 11.4–15.2)

## 2020-08-27 LAB — TROPONIN I (HIGH SENSITIVITY)
Troponin I (High Sensitivity): 36 ng/L — ABNORMAL HIGH (ref <18)
Troponin I (High Sensitivity): 40 ng/L — ABNORMAL HIGH (ref <18)

## 2020-08-27 LAB — MRSA PCR SCREENING: MRSA by PCR: NEGATIVE

## 2020-08-27 MED ORDER — QUETIAPINE FUMARATE 100 MG PO TABS
100.0000 mg | ORAL_TABLET | Freq: Every day | ORAL | Status: DC
  Administered 2020-08-27 – 2020-08-28 (×2): 100 mg via ORAL
  Filled 2020-08-27 (×2): qty 1

## 2020-08-27 MED ORDER — ONDANSETRON HCL 4 MG PO TABS: 4.0000 mg | ORAL_TABLET | Freq: Four times a day (QID) | ORAL | Status: DC | PRN

## 2020-08-27 MED ORDER — STROKE: EARLY STAGES OF RECOVERY BOOK
Freq: Once | Status: AC
  Filled 2020-08-27: qty 1

## 2020-08-27 MED ORDER — POTASSIUM CHLORIDE CRYS ER 20 MEQ PO TBCR
40.0000 meq | EXTENDED_RELEASE_TABLET | Freq: Once | ORAL | Status: AC
  Administered 2020-08-27: 40 meq via ORAL
  Filled 2020-08-27: qty 2

## 2020-08-27 MED ORDER — ASPIRIN EC 81 MG PO TBEC
81.0000 mg | DELAYED_RELEASE_TABLET | Freq: Every day | ORAL | Status: DC
  Filled 2020-08-27: qty 1

## 2020-08-27 MED ORDER — AMIODARONE HCL 200 MG PO TABS
200.0000 mg | ORAL_TABLET | Freq: Every day | ORAL | Status: DC
  Administered 2020-08-28 – 2020-08-31 (×4): 200 mg via ORAL
  Filled 2020-08-27 (×5): qty 1

## 2020-08-27 MED ORDER — ALBUTEROL SULFATE HFA 108 (90 BASE) MCG/ACT IN AERS
2.0000 | INHALATION_SPRAY | Freq: Four times a day (QID) | RESPIRATORY_TRACT | Status: DC | PRN
  Administered 2020-08-28 – 2020-08-30 (×2): 2 via RESPIRATORY_TRACT
  Filled 2020-08-27 (×2): qty 6.7

## 2020-08-27 MED ORDER — MAGNESIUM SULFATE IN D5W 1-5 GM/100ML-% IV SOLN
1.0000 g | Freq: Once | INTRAVENOUS | Status: AC
  Administered 2020-08-27: 1 g via INTRAVENOUS
  Filled 2020-08-27: qty 100

## 2020-08-27 MED ORDER — POTASSIUM CHLORIDE 10 MEQ/100ML IV SOLN
10.0000 meq | INTRAVENOUS | Status: AC
  Administered 2020-08-27 (×3): 10 meq via INTRAVENOUS
  Filled 2020-08-27 (×3): qty 100

## 2020-08-27 MED ORDER — ATORVASTATIN CALCIUM 80 MG PO TABS
80.0000 mg | ORAL_TABLET | Freq: Every day | ORAL | Status: DC
  Administered 2020-08-28 – 2020-08-31 (×4): 80 mg via ORAL
  Filled 2020-08-27 (×5): qty 1

## 2020-08-27 MED ORDER — PERFLUTREN LIPID MICROSPHERE
1.0000 mL | INTRAVENOUS | Status: AC | PRN
  Administered 2020-08-27: 2 mL via INTRAVENOUS
  Filled 2020-08-27 (×2): qty 10

## 2020-08-27 MED ORDER — SENNOSIDES-DOCUSATE SODIUM 8.6-50 MG PO TABS
1.0000 | ORAL_TABLET | Freq: Two times a day (BID) | ORAL | Status: DC
  Administered 2020-08-27 – 2020-08-31 (×8): 1 via ORAL
  Filled 2020-08-27 (×9): qty 1

## 2020-08-27 MED ORDER — ACETAMINOPHEN 650 MG RE SUPP: 650.0000 mg | Freq: Four times a day (QID) | RECTAL | Status: DC | PRN

## 2020-08-27 MED ORDER — GABAPENTIN 300 MG PO CAPS
300.0000 mg | ORAL_CAPSULE | Freq: Three times a day (TID) | ORAL | Status: DC
  Administered 2020-08-27 – 2020-08-31 (×12): 300 mg via ORAL
  Filled 2020-08-27 (×13): qty 1

## 2020-08-27 MED ORDER — ONDANSETRON HCL 4 MG/2ML IJ SOLN: 4.0000 mg | Freq: Four times a day (QID) | INTRAMUSCULAR | Status: DC | PRN

## 2020-08-27 MED ORDER — TAMSULOSIN HCL 0.4 MG PO CAPS
0.4000 mg | ORAL_CAPSULE | Freq: Every day | ORAL | Status: DC
  Administered 2020-08-28 – 2020-08-31 (×4): 0.4 mg via ORAL
  Filled 2020-08-27 (×5): qty 1

## 2020-08-27 MED ORDER — POTASSIUM CHLORIDE CRYS ER 20 MEQ PO TBCR
40.0000 meq | EXTENDED_RELEASE_TABLET | Freq: Two times a day (BID) | ORAL | Status: AC
  Administered 2020-08-27 – 2020-08-28 (×3): 40 meq via ORAL
  Filled 2020-08-27 (×4): qty 2

## 2020-08-27 MED ORDER — IRBESARTAN 75 MG PO TABS
37.5000 mg | ORAL_TABLET | Freq: Every day | ORAL | Status: DC
  Administered 2020-08-28 – 2020-08-31 (×4): 37.5 mg via ORAL
  Filled 2020-08-27 (×5): qty 0.5

## 2020-08-27 MED ORDER — METOPROLOL SUCCINATE ER 25 MG PO TB24
25.0000 mg | ORAL_TABLET | Freq: Every day | ORAL | Status: DC
  Administered 2020-08-28 – 2020-08-31 (×4): 25 mg via ORAL
  Filled 2020-08-27 (×5): qty 1

## 2020-08-27 MED ORDER — TORSEMIDE 20 MG PO TABS
20.0000 mg | ORAL_TABLET | Freq: Every day | ORAL | Status: DC
  Administered 2020-08-28 – 2020-08-31 (×4): 20 mg via ORAL
  Filled 2020-08-27 (×5): qty 1

## 2020-08-27 MED ORDER — APIXABAN 5 MG PO TABS
5.0000 mg | ORAL_TABLET | Freq: Two times a day (BID) | ORAL | Status: DC
  Filled 2020-08-27: qty 1

## 2020-08-27 MED ORDER — SPIRONOLACTONE 12.5 MG HALF TABLET
12.5000 mg | ORAL_TABLET | Freq: Every day | ORAL | Status: DC
  Administered 2020-08-28 – 2020-08-31 (×4): 12.5 mg via ORAL
  Filled 2020-08-27 (×6): qty 1

## 2020-08-27 MED ORDER — HEPARIN (PORCINE) 25000 UT/250ML-% IV SOLN: 1100.0000 [IU]/h | INTRAVENOUS | Status: DC

## 2020-08-27 MED ORDER — ASPIRIN 300 MG RE SUPP
300.0000 mg | Freq: Every day | RECTAL | Status: DC
  Filled 2020-08-27 (×3): qty 1

## 2020-08-27 MED ORDER — ASPIRIN 81 MG PO CHEW
81.0000 mg | CHEWABLE_TABLET | Freq: Every day | ORAL | Status: DC
  Administered 2020-08-27 – 2020-08-28 (×2): 81 mg via ORAL
  Filled 2020-08-27 (×3): qty 1

## 2020-08-27 MED ORDER — POTASSIUM CHLORIDE 10 MEQ/100ML IV SOLN
10.0000 meq | INTRAVENOUS | Status: AC
Start: 2020-08-27 — End: 2020-08-27
  Administered 2020-08-27 (×3): 10 meq via INTRAVENOUS
  Filled 2020-08-27 (×3): qty 100

## 2020-08-27 MED ORDER — ACETAMINOPHEN 325 MG PO TABS
650.0000 mg | ORAL_TABLET | Freq: Four times a day (QID) | ORAL | Status: DC | PRN
  Administered 2020-08-27: 650 mg via ORAL
  Filled 2020-08-27: qty 2

## 2020-08-27 NOTE — ED Notes (Signed)
Pt has two jail staff at bedside with him.
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
## 2020-08-27 NOTE — Progress Notes (Signed)
PROGRESS NOTE    Cody Guzman  WGN:[id number] DOB: [birth year] DOA: 08/26/2020 PCP: [name], MD    Brief Narrative:  55 year old gentleman with history of coronary artery disease, ischemic cardiomyopathy with known ejection fraction 20 to 25%, non-Hodgkin's lymphoma in remission, migraines, chronic kidney disease stage II, anxiety and chronic pain presented to the emergency room from jail with acute onset of chest discomfort and difficulty speaking.  Patient tells me that he went to jail on 4/16 and was unable to get any of his cardiac medications.  He started developing chest pain, unable to swallow.  EMS reported that he had sustained ventricular tachycardia and converted to sinus rhythm with 150 mg IV amiodarone.  He also complains of dizziness and lightheadedness. At the emergency room, he was afebrile.  On room air.  EKG showed sinus tachycardia 127 with PVCs and interventricular conduction delays.  Chest x-ray with cardiomegaly.  Potassium 2.8.  High-sensitivity troponin 36, 40.  BNP 900.  Admitted with cardiology consultation. Patient was found to have acute multifocal stroke in his MRIs.   Assessment & Plan:   Principal Problem:   Stroke due to embolism [facility]) Active Problems:   Chronic kidney disease (CKD), stage II (mild)   Cardiomyopathy (HCC)   Hypokalemia   Elevated troponin   Bipolar 1 disorder (HCC)   Coronary artery disease involving native coronary artery of native heart with angina pectoris (HCC)   Atrial flutter (HCC)   Ventricular tachycardia (HCC)   Difficulty with speech   Hyperbilirubinemia   Aphasia  Acute multifocal cardioembolic stroke in a patient with nonischemic cardiomyopathy with ejection fraction 20%, interruption of anticoagulation therapy: Clinical findings, headache, dizziness, difficulty swallowing and difficulty speaking. CT head findings, not done on arrival. MRI of the brain, patchy multifocal acute infarcts in the left frontal and  right parietal lobes.  No hemorrhage. MRI brain on 06/17/20, was without any evidence of stroke. MRA of the head, negative for large vessel occlusion.  No restenosis. 2D echocardiogram, ordered today. Antiplatelet therapy, patient on Eliquis, interruption of therapy for last 8 days.  Unable to swallow safely today. No evidence of major bleeding, will start patient on heparin. LDL, ordering.  Patient is on Lipitor 80 mg at home.  Start when he is able to take by mouth. Hemoglobin A1c, pending. DVT prophylaxis, will start patient on heparin infusion. Therapy recommendations, pending.  Patient is from custody.  Atypical chest pain/rapid A-flutter/A. Fib, reported V. Tach.  Nonischemic cardiomyopathy.  History of A. fib on Eliquis, interruption of therapies: No evidence of acute coronary syndrome. Patient was given 1 dose of IV amiodarone bolus, then resumed on Toprol-XL 25 mg daily.  On aspirin.  On home dose of amiodarone once a day.  Currently sinus rhythm but tachycardic.  Repeat echocardiogram today.  Cardiology following.  Electrolyte abnormalities: Significant hypokalemia and hypomagnesemia.  Replace aggressively today.  Recheck levels tomorrow morning.  Bipolar disorder: On Seroquel that he will continue.  Paroxysmal a flutter: As above.  Chronic systolic congestive heart failure: Appears compensated.  Diuretics resumed.  Cardiology titrating medications.   DVT prophylaxis: Heparin infusion.   Code Status: Full code Family Communication: None. Disposition Plan: Status is: Observation  The patient will require care spanning > 2 midnights and should be moved to inpatient because: Persistent severe electrolyte disturbances and Inpatient level of care appropriate due to severity of illness  Dispo: The patient is from: Correctional facility  Anticipated d/c is to: Unknown.              Patient currently is not medically stable to d/c.   Difficult to place patient  No  Patient presents with new onset of stroke and significant neurological deficit.  Will need to stay in the hospital, work with rehab and plan for safe disposition.    Consultants:   Cardiology  Neurology  Procedures:   None  Antimicrobials:   None   Subjective: Patient seen and examined.  Police officer at the bedside.  He is on ankle cuffs.  Patient unable to speak.  He is writing very well and expressing on writing.  Feels dizzy and lightheaded.  Denies any chest pain today. Gave him a trial of liquid and he could not swallow. Denies any weakness of the extremities but he cannot express. We discussed about MRI findings, stroke and also working with his heart condition.  Discussed case with cardiology.  Called neurology consult and discussed case.  Objective: Vitals:   08/27/20 0454 08/27/20 0530 08/27/20 0732 08/27/20 1122  BP: (!) 125/99 (!) 119/94 109/87 113/90  Pulse: (!) 102 96 90 94  Resp: 20 20 19 16   Temp: 97.7 F (36.5 C) 97.7 F (36.5 C) (!) 97.5 F (36.4 C) 97.7 F (36.5 C)  TempSrc: Oral Oral Axillary Oral  SpO2: 91% 100% 95% 94%  Weight: 101.9 kg     Height: 5\' 8"  (1.727 m)       Intake/Output Summary (Last 24 hours) at 08/27/2020 1135 Last data filed at 08/27/2020 [id number] Gross per 24 hour  Intake 600 ml  Output --  Net 600 ml   Filed Weights   08/26/20 2327 08/27/20 0454  Weight: 108 kg 101.9 kg    Examination:  General exam: Appears calm.  Anxious with unable to talk. Respiratory system: Clear to auscultation. Respiratory effort normal.  Mostly clear. Cardiovascular system: S1 & S2 heard, RRR.  Tachycardic.  No peripheral edema.   Gastrointestinal system: Abdomen is nondistended, soft and nontender. No organomegaly or masses felt. Normal bowel sounds heard. Central nervous system: Alert and oriented.  Minimal left facial droop.  No other cranial nerve deficit.  Unable to swallow.  Aphasic. Both upper and lower extremity motor and sensory  power is normal. psychiatry: Judgement and insight appear normal. Mood & affect flat and anxious.    Data Reviewed: I have personally reviewed following labs and imaging studies  CBC: Recent Labs  Lab 08/26/20 2329 08/27/20 0922  WBC 7.3 5.9  NEUTROABS 3.7  --   HGB 13.9 13.4  HCT 41.8 39.9  MCV 90.5 89.1  PLT 253 [id number]   Basic Metabolic Panel: Recent Labs  Lab 08/26/20 2329 08/27/20 0922  NA 140 139  K 2.8* 3.4*  CL 107 106  CO2 23 23  GLUCOSE 110* 126*  BUN 9 9  CREATININE 1.22 1.26*  CALCIUM 9.2 9.4  MG  --  1.4*   GFR: Estimated Creatinine Clearance: 75.7 mL/min (A) (by C-G formula based on SCr of 1.26 mg/dL (H)). Liver Function Tests: Recent Labs  Lab 08/26/20 2329 08/27/20 0922  AST 21 19  ALT 15 14  ALKPHOS 76 66  BILITOT 2.6* 2.9*  PROT 7.4 7.0  ALBUMIN 4.0 3.7   No results for input(s): LIPASE, AMYLASE in the last 168 hours. No results for input(s): AMMONIA in the last 168 hours. Coagulation Profile: Recent Labs  Lab 08/26/20 2329  INR 1.2   Cardiac Enzymes: No  results for input(s): CKTOTAL, CKMB, CKMBINDEX, TROPONINI in the last 168 hours. BNP (last 3 results) No results for input(s): PROBNP in the last 8760 hours. HbA1C: No results for input(s): HGBA1C in the last 72 hours. CBG: No results for input(s): GLUCAP in the last 168 hours. Lipid Profile: No results for input(s): CHOL, HDL, LDLCALC, TRIG, CHOLHDL, LDLDIRECT in the last 72 hours. Thyroid Function Tests: Recent Labs    08/27/20 0922  TSH 3.923   Anemia Panel: No results for input(s): VITAMINB12, FOLATE, FERRITIN, TIBC, IRON, RETICCTPCT in the last 72 hours. Sepsis Labs: No results for input(s): PROCALCITON, LATICACIDVEN in the last 168 hours.  Recent Results (from the past 240 hour(s))  Resp Panel by RT-PCR (Flu A&B, Covid) Nasopharyngeal Swab     Status: None   Collection Time: 08/27/20  3:29 AM   Specimen: Nasopharyngeal Swab; Nasopharyngeal(NP) swabs in vial transport  medium  Result Value Ref Range Status   SARS Coronavirus 2 by RT PCR NEGATIVE NEGATIVE Final    Comment: (NOTE) SARS-CoV-2 target nucleic acids are NOT DETECTED.  The SARS-CoV-2 RNA is generally detectable in upper respiratory specimens during the acute phase of infection. The lowest concentration of SARS-CoV-2 viral copies this assay can detect is 138 copies/mL. A negative result does not preclude SARS-Cov-2 infection and should not be used as the sole basis for treatment or other patient management decisions. A negative result may occur with  improper specimen collection/handling, submission of specimen other than nasopharyngeal swab, presence of viral mutation(s) within the areas targeted by this assay, and inadequate number of viral copies(<138 copies/mL). A negative result must be combined with clinical observations, patient history, and epidemiological information. The expected result is Negative.  Fact Sheet for Patients:  EntrepreneurPulse.com.au  Fact Sheet for Healthcare Providers:  IncredibleEmployment.be  This test is no t yet approved or cleared by the [place] FDA and  has been authorized for detection and/or diagnosis of SARS-CoV-2 by FDA under an Emergency Use Authorization (EUA). This EUA will remain  in effect (meaning this test can be used) for the duration of the COVID-19 declaration under Section 564(b)(1) of the Act, 21 U.S.C.section 360bbb-3(b)(1), unless the authorization is terminated  or revoked sooner.       Influenza A by PCR NEGATIVE NEGATIVE Final   Influenza B by PCR NEGATIVE NEGATIVE Final    Comment: (NOTE) The Xpert Xpress SARS-CoV-2/FLU/RSV plus assay is intended as an aid in the diagnosis of influenza from Nasopharyngeal swab specimens and should not be used as a sole basis for treatment. Nasal washings and aspirates are unacceptable for Xpert Xpress SARS-CoV-2/FLU/RSV testing.  Fact Sheet for  Patients: EntrepreneurPulse.com.au  Fact Sheet for Healthcare Providers: IncredibleEmployment.be  This test is not yet approved or cleared by the [place] FDA and has been authorized for detection and/or diagnosis of SARS-CoV-2 by FDA under an Emergency Use Authorization (EUA). This EUA will remain in effect (meaning this test can be used) for the duration of the COVID-19 declaration under Section 564(b)(1) of the Act, 21 U.S.C. section 360bbb-3(b)(1), unless the authorization is terminated or revoked.  Performed at [facility], [place] [address] [place], [place] [address]   MRSA PCR Screening     Status: None   Collection Time: 08/27/20  5:09 AM   Specimen: Nasal Mucosa; Nasopharyngeal  Result Value Ref Range Status   MRSA by PCR NEGATIVE NEGATIVE Final    Comment:        The GeneXpert MRSA Assay (FDA approved  for NASAL specimens only), is one component of a comprehensive MRSA colonization surveillance program. It is not intended to diagnose MRSA infection nor to guide or monitor treatment for MRSA infections. Performed at [facility], [place] [address], [place], [place] [address]          Radiology Studies: MR ANGIO HEAD WO CONTRAST  Result Date: 08/27/2020 CLINICAL DATA:  55 year old male with acute onset chest pain and difficulty speaking status post ventricular tachycardia. Lost the ability to speak. EXAM: MRI HEAD WITHOUT CONTRAST MRA HEAD WITHOUT CONTRAST TECHNIQUE: Multiplanar, multiecho pulse sequences of the brain and surrounding structures were obtained without intravenous contrast. Angiographic images of the head were obtained using MRA technique without contrast. COMPARISON:  Brain MRI 06/17/2020. FINDINGS: MRI HEAD FINDINGS Brain: Small areas of cortical and white matter diffusion restriction are new since February and involve the left frontal operculum, left middle frontal gyrus, also scattered in the right  parietal lobe. Associated T2 and FLAIR hyperintensity in the greatest areas of involvement. No acute intracranial hemorrhage or mass effect. Underlying chronic encephalomalacia in the right inferior frontal gyrus, scattered and patchy bilateral white matter T2 and FLAIR hyperintensity. Chronic anterior left frontal lobe developmental venous anomaly suspected. Stable T2 heterogeneity in the bilateral basal ganglia. Small chronic infarcts in the posterior cerebellum. No midline shift, mass effect, evidence of mass lesion, ventriculomegaly, extra-axial collection or acute intracranial hemorrhage. Cervicomedullary junction and pituitary are within normal limits. Vascular: Major intracranial vascular flow voids are stable since February. See MRA findings below. Skull and upper cervical spine: Stable, negative. Sinuses/Orbits: Stable, negative. Other: Mastoids remain clear.  Negative visible scalp and face. MRA HEAD FINDINGS Antegrade flow in the posterior circulation with mildly tortuous vertebral and basilar arteries. Mildly dominant right V4. Normal PICA origins. No vertebrobasilar stenosis. Normal SCA and PCA origins. Posterior communicating arteries are diminutive or absent. Bilateral PCA branches are within normal limits. Antegrade flow in both ICA siphons. Mild to moderate bilateral siphon irregularity in keeping with atherosclerosis, but no hemodynamically significant siphon stenosis identified. Patent carotid termini. Normal MCA and ACA origins. Mild irregularity left ACA A1 segment. Diminutive or absent anterior communicating artery. MCA M1 segments are within normal limits. MCA bi/trifurcations are patent without stenosis. Visible bilateral MCA and ACA branches are within normal limits. No left MCA branch occlusion identified. IMPRESSION: 1. Positive for patchy, scattered acute infarcts in the Left Frontal (operculum, middle frontal gyrus) and Right Parietal lobes. Favor embolic etiology in this clinical  setting. No associated hemorrhage or mass effect. 2. MRA is negative for large vessel occlusion. Evidence of intracranial atherosclerosis but no hemodynamically significant stenosis identified. 3. Underlying chronic ischemic disease otherwise stable since February. Electronically Signed   By: Genevie Ann M.D.   On: 08/27/2020 07:22   MR BRAIN WO CONTRAST  Result Date: 08/27/2020 CLINICAL DATA:  55 year old male with acute onset chest pain and difficulty speaking status post ventricular tachycardia. Lost the ability to speak. EXAM: MRI HEAD WITHOUT CONTRAST MRA HEAD WITHOUT CONTRAST TECHNIQUE: Multiplanar, multiecho pulse sequences of the brain and surrounding structures were obtained without intravenous contrast. Angiographic images of the head were obtained using MRA technique without contrast. COMPARISON:  Brain MRI 06/17/2020. FINDINGS: MRI HEAD FINDINGS Brain: Small areas of cortical and white matter diffusion restriction are new since February and involve the left frontal operculum, left middle frontal gyrus, also scattered in the right parietal lobe. Associated T2 and FLAIR hyperintensity in the greatest areas of involvement. No acute intracranial hemorrhage or  mass effect. Underlying chronic encephalomalacia in the right inferior frontal gyrus, scattered and patchy bilateral white matter T2 and FLAIR hyperintensity. Chronic anterior left frontal lobe developmental venous anomaly suspected. Stable T2 heterogeneity in the bilateral basal ganglia. Small chronic infarcts in the posterior cerebellum. No midline shift, mass effect, evidence of mass lesion, ventriculomegaly, extra-axial collection or acute intracranial hemorrhage. Cervicomedullary junction and pituitary are within normal limits. Vascular: Major intracranial vascular flow voids are stable since February. See MRA findings below. Skull and upper cervical spine: Stable, negative. Sinuses/Orbits: Stable, negative. Other: Mastoids remain clear.  Negative  visible scalp and face. MRA HEAD FINDINGS Antegrade flow in the posterior circulation with mildly tortuous vertebral and basilar arteries. Mildly dominant right V4. Normal PICA origins. No vertebrobasilar stenosis. Normal SCA and PCA origins. Posterior communicating arteries are diminutive or absent. Bilateral PCA branches are within normal limits. Antegrade flow in both ICA siphons. Mild to moderate bilateral siphon irregularity in keeping with atherosclerosis, but no hemodynamically significant siphon stenosis identified. Patent carotid termini. Normal MCA and ACA origins. Mild irregularity left ACA A1 segment. Diminutive or absent anterior communicating artery. MCA M1 segments are within normal limits. MCA bi/trifurcations are patent without stenosis. Visible bilateral MCA and ACA branches are within normal limits. No left MCA branch occlusion identified. IMPRESSION: 1. Positive for patchy, scattered acute infarcts in the Left Frontal (operculum, middle frontal gyrus) and Right Parietal lobes. Favor embolic etiology in this clinical setting. No associated hemorrhage or mass effect. 2. MRA is negative for large vessel occlusion. Evidence of intracranial atherosclerosis but no hemodynamically significant stenosis identified. 3. Underlying chronic ischemic disease otherwise stable since February. Electronically Signed   By: Genevie Ann M.D.   On: 08/27/2020 07:22   DG Chest Port 1 View  Result Date: 08/26/2020 CLINICAL DATA:  Chest pain and ventricular tachycardia. EXAM: PORTABLE CHEST 1 VIEW COMPARISON:  06/16/2020 FINDINGS: Cardiac enlargement. No vascular congestion, edema, or consolidation. No pleural effusions. No pneumothorax. Mediastinal contours appear intact. IMPRESSION: Cardiac enlargement.  No active pulmonary disease. Electronically Signed   By: Lucienne Capers M.D.   On: 08/26/2020 23:48        Scheduled Meds: .  stroke: mapping our early stages of recovery book   Does not apply Once  .  amiodarone  200 mg Oral Daily  . aspirin EC  81 mg Oral Daily  . atorvastatin  80 mg Oral Daily  . gabapentin  300 mg Oral TID  . irbesartan  37.5 mg Oral Daily  . metoprolol succinate  25 mg Oral Daily  . potassium chloride  40 mEq Oral BID  . QUEtiapine  100 mg Oral QHS  . senna-docusate  1 tablet Oral BID  . spironolactone  12.5 mg Oral Daily  . tamsulosin  0.4 mg Oral Daily  . torsemide  20 mg Oral Daily   Continuous Infusions: . magnesium sulfate bolus IVPB       LOS: 0 days    Time spent: Additional 40 minutes.    Barb Merino, MD [facility] Pager [phone]

## 2020-08-27 NOTE — Progress Notes (Signed)
   Consult from earlier this morning reviewed, agree with plan.  Aphasic.  He is back on Eliquis.  Acute infarcts noted on MRI, favor embolic etiology, no large vessel occlusion.  Echocardiogram has been ordered by primary team. Known cardiomyopathy.  Continue with goal-directed medical therapy.  Candee Furbish, MD
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
## 2020-08-27 NOTE — Telephone Encounter (Signed)
Spoke with Anderson Malta at [facility] center and explained we still have not received the release form. She is going to fax it over again now. Once we receive the form I will release the information.
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
## 2020-08-27 NOTE — Plan of Care (Signed)

## 2020-08-27 NOTE — Telephone Encounter (Signed)
Could you please look into this? 
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
## 2020-08-27 NOTE — Plan of Care (Signed)
  Problem: Education: Goal: Knowledge of General Education information will improve Description: Including pain rating scale, medication(s)/side effects and non-pharmacologic comfort measures Outcome: Progressing   Problem: Clinical Measurements: Goal: Ability to maintain clinical measurements within normal limits will improve Outcome: Progressing   Problem: Clinical Measurements: Goal: Diagnostic test results will improve Outcome: Progressing   Problem: Clinical Measurements: Goal: Cardiovascular complication will be avoided Outcome: Progressing   Problem: Nutrition: Goal: Adequate nutrition will be maintained Outcome: Progressing   Problem: Pain Managment: Goal: General experience of comfort will improve Outcome: Progressing   

## 2020-08-27 NOTE — Progress Notes (Signed)
  Echocardiogram 2D Echocardiogram has been performed.  Jennette Dubin 08/27/2020, 3:29 PM

## 2020-08-27 NOTE — Telephone Encounter (Signed)
Anderson Malta calling from [facility] is calling for Cody Guzman - she has faxed 4  request and has not receive any records. Cb- [phone]

## 2020-08-27 NOTE — Telephone Encounter (Signed)
Received the records release form and released medication list

## 2020-08-28 ENCOUNTER — Telehealth: Payer: Self-pay

## 2020-08-28 ENCOUNTER — Inpatient Hospital Stay (HOSPITAL_COMMUNITY)

## 2020-08-28 DIAGNOSIS — I25119 Atherosclerotic heart disease of native coronary artery with unspecified angina pectoris: Secondary | ICD-10-CM | POA: Diagnosis not present

## 2020-08-28 DIAGNOSIS — I6310 Cerebral infarction due to embolism of unspecified precerebral artery: Secondary | ICD-10-CM | POA: Diagnosis not present

## 2020-08-28 DIAGNOSIS — I484 Atypical atrial flutter: Secondary | ICD-10-CM

## 2020-08-28 DIAGNOSIS — R4701 Aphasia: Secondary | ICD-10-CM | POA: Diagnosis not present

## 2020-08-28 LAB — CBC
HCT: 36.9 % — ABNORMAL LOW (ref 39.0–52.0)
Hemoglobin: 12.1 g/dL — ABNORMAL LOW (ref 13.0–17.0)
MCH: 29.6 pg (ref 26.0–34.0)
MCHC: 32.8 g/dL (ref 30.0–36.0)
MCV: 90.2 fL (ref 80.0–100.0)
Platelets: 228 10*3/uL (ref 150–400)
RBC: 4.09 MIL/uL — ABNORMAL LOW (ref 4.22–5.81)
RDW: 15.9 % — ABNORMAL HIGH (ref 11.5–15.5)
WBC: 6.2 10*3/uL (ref 4.0–10.5)
nRBC: 0 % (ref 0.0–0.2)

## 2020-08-28 LAB — COMPREHENSIVE METABOLIC PANEL
ALT: 12 U/L (ref 0–44)
AST: 18 U/L (ref 15–41)
Albumin: 3.4 g/dL — ABNORMAL LOW (ref 3.5–5.0)
Alkaline Phosphatase: 70 U/L (ref 38–126)
Anion gap: 9 (ref 5–15)
BUN: 8 mg/dL (ref 6–20)
CO2: 22 mmol/L (ref 22–32)
Calcium: 9.2 mg/dL (ref 8.9–10.3)
Chloride: 108 mmol/L (ref 98–111)
Creatinine, Ser: 1.26 mg/dL — ABNORMAL HIGH (ref 0.61–1.24)
GFR, Estimated: >60 mL/min (ref >60)
Glucose, Bld: 97 mg/dL (ref 70–99)
Potassium: 3.3 mmol/L — ABNORMAL LOW (ref 3.5–5.1)
Sodium: 139 mmol/L (ref 135–145)
Total Bilirubin: 3.3 mg/dL — ABNORMAL HIGH (ref 0.3–1.2)
Total Protein: 6.2 g/dL — ABNORMAL LOW (ref 6.5–8.1)

## 2020-08-28 LAB — MAGNESIUM: Magnesium: 1.6 mg/dL — ABNORMAL LOW (ref 1.7–2.4)

## 2020-08-28 MED ORDER — GADOBUTROL 1 MMOL/ML IV SOLN
10.0000 mL | Freq: Once | INTRAVENOUS | Status: AC | PRN
  Administered 2020-08-28: 10 mL via INTRAVENOUS

## 2020-08-28 MED ORDER — APIXABAN 5 MG PO TABS
5.0000 mg | ORAL_TABLET | Freq: Two times a day (BID) | ORAL | Status: DC
  Administered 2020-08-28 – 2020-08-31 (×7): 5 mg via ORAL
  Filled 2020-08-28 (×7): qty 1

## 2020-08-28 MED ORDER — MAGNESIUM SULFATE IN D5W 1-5 GM/100ML-% IV SOLN
1.0000 g | Freq: Once | INTRAVENOUS | Status: AC
  Administered 2020-08-28: 1 g via INTRAVENOUS
  Filled 2020-08-28: qty 100

## 2020-08-28 NOTE — Plan of Care (Signed)

## 2020-08-28 NOTE — Progress Notes (Signed)
PROGRESS NOTE    Cody Guzman  IRW:[id number] DOB: Sep 26, 1965 DOA: 08/26/2020 PCP: [name], MD    Brief Narrative:  55 year old gentleman with history of coronary artery disease, ischemic cardiomyopathy with known ejection fraction 20 to 25%, non-Hodgkin's lymphoma in remission, migraines, chronic kidney disease stage II, anxiety and chronic pain presented to the emergency room from jail with acute onset of chest discomfort and difficulty speaking.  Patient tells me that he went to jail on 4/16 and was unable to get any of his cardiac medications.  He started developing chest pain, unable to swallow.  EMS reported that he had sustained ventricular tachycardia and converted to sinus rhythm with 150 mg IV amiodarone.  He also complains of dizziness and lightheadedness. At the emergency room, he was afebrile.  On room air.  EKG showed sinus tachycardia 127 with PVCs and interventricular conduction delays.  Chest x-ray with cardiomegaly.  Potassium 2.8.  High-sensitivity troponin 36, 40.  BNP 900.  Admitted with cardiology consultation. Patient was found to have acute multifocal stroke in his MRIs.   Assessment & Plan:   Principal Problem:   Stroke due to embolism [facility]) Active Problems:   Chronic kidney disease (CKD), stage II (mild)   Cardiomyopathy (HCC)   Hypokalemia   Elevated troponin   Bipolar 1 disorder (HCC)   Coronary artery disease involving native coronary artery of native heart with angina pectoris (HCC)   Atrial flutter (HCC)   Ventricular tachycardia (HCC)   Difficulty with speech   Hyperbilirubinemia   Aphasia   Arterial ischemic stroke, multifocal, multiple vascular territories, acute ([place])  Acute multifocal cardioembolic stroke in a patient with nonischemic cardiomyopathy with ejection fraction 20%, interruption of anticoagulation therapy: Clinical findings, headache, dizziness, difficulty swallowing and expressive aphasia. CT head findings, not done on  arrival. MRI of the brain, patchy multifocal acute infarcts in the left frontal and right parietal lobes.  No hemorrhage. MRI brain on 06/17/20, was without any evidence of stroke. MRA of the head, negative for large vessel occlusion.  No significant stenosis. 2D echocardiogram, ejection fraction 20%.  No evidence of blood clot. Antiplatelet therapy, patient on Eliquis, interruption of therapy for last 8 days.  Neurology recommended to hold therapeutic anticoagulation for 7 days.  Started on aspirin. LDL, 120. Patient is on Lipitor 80 mg at home.  Resume. Hemoglobin A1c, 5.9.  No need to treatment. DVT prophylaxis, SCDs. Therapy recommendations, pending.  Patient is from custody. To be seen by speech, PT OT.  Unsure how much rehab we can do at jail.  Atypical chest pain/rapid A-flutter/A. Fib, reported V. Tach.  Nonischemic cardiomyopathy.  History of A. fib on Eliquis, interruption of therapies: No evidence of acute coronary syndrome. Patient was given 1 dose of IV amiodarone bolus, then resumed on Toprol-XL 25 mg daily.  On aspirin.  On home dose of amiodarone once a day.  Currently sinus rhythm but tachycardic.   Cardiology following.  Recommended rate control strategy.  Electrolyte abnormalities: Significant hypokalemia and hypomagnesemia.  Replace aggressively today.  Recheck levels tomorrow morning.  Further replace potassium today.  Bipolar disorder: Stable.  On Seroquel that he will continue.  Paroxysmal a flutter: As above.  Chronic systolic congestive heart failure: Appears compensated.  Diuretics resumed.  Cardiology titrating medications.   DVT prophylaxis: SCDs   Code Status: Full code Family Communication: None. Disposition Plan: Status is: Inpatient  Dispo: The patient is from: Correctional facility  Anticipated d/c is to: Unknown.              Patient currently is not medically stable to d/c.   Difficult to place patient No Consultants:    Cardiology  Neurology  Procedures:   None  Antimicrobials:   None   Subjective: Patient seen and examined.  No overnight events.  Denies any chest pain or palpitations.  He still has fogginess.  He has not walked.  His speech is returning and was able to speak few words.  He can swallow but very small sips and small bites. Was able to tell me that his wife came to visit him by mistake as she was not supposed to see him because he is under police custody.  Objective: Vitals:   08/28/20 0521 08/28/20 0734 08/28/20 0855 08/28/20 0902  BP:  (!) 80/62 118/87   Pulse:  88  95  Resp: 19 (!) 21 (!) 40   Temp:  98.1 F (36.7 C) 98.1 F (36.7 C)   TempSrc:  Oral Oral   SpO2:  93% 94%   Weight:      Height:        Intake/Output Summary (Last 24 hours) at 08/28/2020 1034 Last data filed at 08/28/2020 0500 Gross per 24 hour  Intake 120 ml  Output 550 ml  Net -430 ml   Filed Weights   08/26/20 2327 08/27/20 0454 08/28/20 0518  Weight: 108 kg 101.9 kg 104.5 kg    Examination:  General exam: Appears calm.  Anxious due to circumstances.  Otherwise looks comfortable on room air. Respiratory system: Clear to auscultation. Respiratory effort normal.  Mostly clear. Cardiovascular system: S1 & S2 heard, RRR.  Tachycardic.  No peripheral edema.   Gastrointestinal system: Abdomen is nondistended, soft and nontender. No organomegaly or masses felt. Normal bowel sounds heard. Central nervous system: Alert and oriented.  Minimal left facial droop.  Expressive aphasia.  Clearing up than before. Both upper and lower extremity motor and sensory power is normal.  Dizzy on standing. psychiatry: Judgement and insight appear normal. Mood & affect flat.    Data Reviewed: I have personally reviewed following labs and imaging studies  CBC: Recent Labs  Lab 08/26/20 2329 08/27/20 0922 08/28/20 0038  WBC 7.3 5.9 6.2  NEUTROABS 3.7  --   --   HGB 13.9 13.4 12.1*  HCT 41.8 39.9 36.9*   MCV 90.5 89.1 90.2  PLT 253 213 338   Basic Metabolic Panel: Recent Labs  Lab 08/26/20 2329 08/27/20 0922 08/27/20 1150 08/28/20 0038  NA 140 139  --  139  K 2.8* 3.4* 3.4* 3.3*  CL 107 106  --  108  CO2 23 23  --  22  GLUCOSE 110* 126*  --  97  BUN 9 9  --  8  CREATININE 1.22 1.26*  --  1.26*  CALCIUM 9.2 9.4  --  9.2  MG  --  1.4*  --  1.6*   GFR: Estimated Creatinine Clearance: 76.7 mL/min (A) (by C-G formula based on SCr of 1.26 mg/dL (H)). Liver Function Tests: Recent Labs  Lab 08/26/20 2329 08/27/20 0922 08/28/20 0038  AST 21 19 18   ALT 15 14 12   ALKPHOS 76 66 70  BILITOT 2.6* 2.9* 3.3*  PROT 7.4 7.0 6.2*  ALBUMIN 4.0 3.7 3.4*   No results for input(s): LIPASE, AMYLASE in the last 168 hours. No results for input(s): AMMONIA in the last 168 hours. Coagulation Profile: Recent Labs  Lab 08/26/20 2329  INR 1.2   Cardiac Enzymes: No results for input(s): CKTOTAL, CKMB, CKMBINDEX, TROPONINI in the last 168 hours. BNP (last 3 results) No results for input(s): PROBNP in the last 8760 hours. HbA1C: Recent Labs    08/27/20 1150  HGBA1C 5.9*   CBG: No results for input(s): GLUCAP in the last 168 hours. Lipid Profile: Recent Labs    08/27/20 1150  CHOL 168  HDL 36*  LDLCALC 120*  TRIG 59  CHOLHDL 4.7   Thyroid Function Tests: Recent Labs    08/27/20 0922  TSH 3.923   Anemia Panel: No results for input(s): VITAMINB12, FOLATE, FERRITIN, TIBC, IRON, RETICCTPCT in the last 72 hours. Sepsis Labs: No results for input(s): PROCALCITON, LATICACIDVEN in the last 168 hours.  Recent Results (from the past 240 hour(s))  Resp Panel by RT-PCR (Flu A&B, Covid) Nasopharyngeal Swab     Status: None   Collection Time: 08/27/20  3:29 AM   Specimen: Nasopharyngeal Swab; Nasopharyngeal(NP) swabs in vial transport medium  Result Value Ref Range Status   SARS Coronavirus 2 by RT PCR NEGATIVE NEGATIVE Final    Comment: (NOTE) SARS-CoV-2 target nucleic acids are  NOT DETECTED.  The SARS-CoV-2 RNA is generally detectable in upper respiratory specimens during the acute phase of infection. The lowest concentration of SARS-CoV-2 viral copies this assay can detect is 138 copies/mL. A negative result does not preclude SARS-Cov-2 infection and should not be used as the sole basis for treatment or other patient management decisions. A negative result may occur with  improper specimen collection/handling, submission of specimen other than nasopharyngeal swab, presence of viral mutation(s) within the areas targeted by this assay, and inadequate number of viral copies(<138 copies/mL). A negative result must be combined with clinical observations, patient history, and epidemiological information. The expected result is Negative.  Fact Sheet for Patients:  EntrepreneurPulse.com.au  Fact Sheet for Healthcare Providers:  IncredibleEmployment.be  This test is no t yet approved or cleared by the [place] FDA and  has been authorized for detection and/or diagnosis of SARS-CoV-2 by FDA under an Emergency Use Authorization (EUA). This EUA will remain  in effect (meaning this test can be used) for the duration of the COVID-19 declaration under Section 564(b)(1) of the Act, 21 U.S.C.section 360bbb-3(b)(1), unless the authorization is terminated  or revoked sooner.       Influenza A by PCR NEGATIVE NEGATIVE Final   Influenza B by PCR NEGATIVE NEGATIVE Final    Comment: (NOTE) The Xpert Xpress SARS-CoV-2/FLU/RSV plus assay is intended as an aid in the diagnosis of influenza from Nasopharyngeal swab specimens and should not be used as a sole basis for treatment. Nasal washings and aspirates are unacceptable for Xpert Xpress SARS-CoV-2/FLU/RSV testing.  Fact Sheet for Patients: EntrepreneurPulse.com.au  Fact Sheet for Healthcare Providers: IncredibleEmployment.be  This test is not  yet approved or cleared by the [place] FDA and has been authorized for detection and/or diagnosis of SARS-CoV-2 by FDA under an Emergency Use Authorization (EUA). This EUA will remain in effect (meaning this test can be used) for the duration of the COVID-19 declaration under Section 564(b)(1) of the Act, 21 U.S.C. section 360bbb-3(b)(1), unless the authorization is terminated or revoked.  Performed at [facility], [place] [address], [place], [place] [address]   MRSA PCR Screening     Status: None   Collection Time: 08/27/20  5:09 AM   Specimen: Nasal Mucosa; Nasopharyngeal  Result Value Ref Range Status   MRSA  by PCR NEGATIVE NEGATIVE Final    Comment:        The GeneXpert MRSA Assay (FDA approved for NASAL specimens only), is one component of a comprehensive MRSA colonization surveillance program. It is not intended to diagnose MRSA infection nor to guide or monitor treatment for MRSA infections. Performed at [facility], [place] [address] [place], [place] [address]          Radiology Studies: MR ANGIO HEAD WO CONTRAST  Result Date: 08/27/2020 CLINICAL DATA:  55 year old male with acute onset chest pain and difficulty speaking status post ventricular tachycardia. Lost the ability to speak. EXAM: MRI HEAD WITHOUT CONTRAST MRA HEAD WITHOUT CONTRAST TECHNIQUE: Multiplanar, multiecho pulse sequences of the brain and surrounding structures were obtained without intravenous contrast. Angiographic images of the head were obtained using MRA technique without contrast. COMPARISON:  Brain MRI 06/17/2020. FINDINGS: MRI HEAD FINDINGS Brain: Small areas of cortical and white matter diffusion restriction are new since February and involve the left frontal operculum, left middle frontal gyrus, also scattered in the right parietal lobe. Associated T2 and FLAIR hyperintensity in the greatest areas of involvement. No acute intracranial hemorrhage or mass effect. Underlying chronic  encephalomalacia in the right inferior frontal gyrus, scattered and patchy bilateral white matter T2 and FLAIR hyperintensity. Chronic anterior left frontal lobe developmental venous anomaly suspected. Stable T2 heterogeneity in the bilateral basal ganglia. Small chronic infarcts in the posterior cerebellum. No midline shift, mass effect, evidence of mass lesion, ventriculomegaly, extra-axial collection or acute intracranial hemorrhage. Cervicomedullary junction and pituitary are within normal limits. Vascular: Major intracranial vascular flow voids are stable since February. See MRA findings below. Skull and upper cervical spine: Stable, negative. Sinuses/Orbits: Stable, negative. Other: Mastoids remain clear.  Negative visible scalp and face. MRA HEAD FINDINGS Antegrade flow in the posterior circulation with mildly tortuous vertebral and basilar arteries. Mildly dominant right V4. Normal PICA origins. No vertebrobasilar stenosis. Normal SCA and PCA origins. Posterior communicating arteries are diminutive or absent. Bilateral PCA branches are within normal limits. Antegrade flow in both ICA siphons. Mild to moderate bilateral siphon irregularity in keeping with atherosclerosis, but no hemodynamically significant siphon stenosis identified. Patent carotid termini. Normal MCA and ACA origins. Mild irregularity left ACA A1 segment. Diminutive or absent anterior communicating artery. MCA M1 segments are within normal limits. MCA bi/trifurcations are patent without stenosis. Visible bilateral MCA and ACA branches are within normal limits. No left MCA branch occlusion identified. IMPRESSION: 1. Positive for patchy, scattered acute infarcts in the Left Frontal (operculum, middle frontal gyrus) and Right Parietal lobes. Favor embolic etiology in this clinical setting. No associated hemorrhage or mass effect. 2. MRA is negative for large vessel occlusion. Evidence of intracranial atherosclerosis but no hemodynamically  significant stenosis identified. 3. Underlying chronic ischemic disease otherwise stable since February. Electronically Signed   By: Genevie Ann M.D.   On: 08/27/2020 07:22   MR BRAIN WO CONTRAST  Result Date: 08/27/2020 CLINICAL DATA:  55 year old male with acute onset chest pain and difficulty speaking status post ventricular tachycardia. Lost the ability to speak. EXAM: MRI HEAD WITHOUT CONTRAST MRA HEAD WITHOUT CONTRAST TECHNIQUE: Multiplanar, multiecho pulse sequences of the brain and surrounding structures were obtained without intravenous contrast. Angiographic images of the head were obtained using MRA technique without contrast. COMPARISON:  Brain MRI 06/17/2020. FINDINGS: MRI HEAD FINDINGS Brain: Small areas of cortical and white matter diffusion restriction are new since February and involve the left frontal operculum, left middle frontal gyrus, also  scattered in the right parietal lobe. Associated T2 and FLAIR hyperintensity in the greatest areas of involvement. No acute intracranial hemorrhage or mass effect. Underlying chronic encephalomalacia in the right inferior frontal gyrus, scattered and patchy bilateral white matter T2 and FLAIR hyperintensity. Chronic anterior left frontal lobe developmental venous anomaly suspected. Stable T2 heterogeneity in the bilateral basal ganglia. Small chronic infarcts in the posterior cerebellum. No midline shift, mass effect, evidence of mass lesion, ventriculomegaly, extra-axial collection or acute intracranial hemorrhage. Cervicomedullary junction and pituitary are within normal limits. Vascular: Major intracranial vascular flow voids are stable since February. See MRA findings below. Skull and upper cervical spine: Stable, negative. Sinuses/Orbits: Stable, negative. Other: Mastoids remain clear.  Negative visible scalp and face. MRA HEAD FINDINGS Antegrade flow in the posterior circulation with mildly tortuous vertebral and basilar arteries. Mildly dominant right  V4. Normal PICA origins. No vertebrobasilar stenosis. Normal SCA and PCA origins. Posterior communicating arteries are diminutive or absent. Bilateral PCA branches are within normal limits. Antegrade flow in both ICA siphons. Mild to moderate bilateral siphon irregularity in keeping with atherosclerosis, but no hemodynamically significant siphon stenosis identified. Patent carotid termini. Normal MCA and ACA origins. Mild irregularity left ACA A1 segment. Diminutive or absent anterior communicating artery. MCA M1 segments are within normal limits. MCA bi/trifurcations are patent without stenosis. Visible bilateral MCA and ACA branches are within normal limits. No left MCA branch occlusion identified. IMPRESSION: 1. Positive for patchy, scattered acute infarcts in the Left Frontal (operculum, middle frontal gyrus) and Right Parietal lobes. Favor embolic etiology in this clinical setting. No associated hemorrhage or mass effect. 2. MRA is negative for large vessel occlusion. Evidence of intracranial atherosclerosis but no hemodynamically significant stenosis identified. 3. Underlying chronic ischemic disease otherwise stable since February. Electronically Signed   By: Genevie Ann M.D.   On: 08/27/2020 07:22   DG Chest Port 1 View  Result Date: 08/26/2020 CLINICAL DATA:  Chest pain and ventricular tachycardia. EXAM: PORTABLE CHEST 1 VIEW COMPARISON:  06/16/2020 FINDINGS: Cardiac enlargement. No vascular congestion, edema, or consolidation. No pleural effusions. No pneumothorax. Mediastinal contours appear intact. IMPRESSION: Cardiac enlargement.  No active pulmonary disease. Electronically Signed   By: Lucienne Capers M.D.   On: 08/26/2020 23:48   ECHOCARDIOGRAM LIMITED  Result Date: 08/27/2020    ECHOCARDIOGRAM REPORT   Patient Name:   BOLIVAR KORANDA Date of Exam: 08/27/2020 Medical Rec #:  [id number]           Height:       68.0 in Accession #:    [id number]          Weight:       224.6 lb Date of Birth:   01-28-1966            BSA:          2.147 m Patient Age:    88 years            BP:           113/90 mmHg Patient Gender: M                   HR:           94 bpm. Exam Location:  Inpatient Procedure: Intracardiac Opacification Agent, Limited Echo, Limited Color Doppler            and Cardiac Doppler Indications:    Stroke  History:        Patient has prior history of Echocardiogram examinations, most  recent 06/20/2020. CHF; CAD.  Sonographer:    Mikki Santee RDCS (AE) Referring Phys: [id number] [place]  1. Left ventricular ejection fraction, by estimation, is <20%. The left ventricle has severely decreased function. The left ventricle demonstrates global hypokinesis. The left ventricular internal cavity size was moderately dilated. Left ventricular diastolic parameters are consistent with Grade III diastolic dysfunction (restrictive).  2. Right ventricular systolic function is moderately reduced. The right ventricular size is normal. There is moderately elevated pulmonary artery systolic pressure.  3. The mitral valve is normal in structure. Mild to moderate mitral valve regurgitation. No evidence of mitral stenosis.  4. Tricuspid valve regurgitation is severe.  5. The aortic valve is normal in structure. Aortic valve regurgitation is trivial. No aortic stenosis is present.  6. The inferior vena cava is dilated in size with <50% respiratory variability, suggesting right atrial pressure of 15 mmHg. FINDINGS  Left Ventricle: Left ventricular ejection fraction, by estimation, is <20%. The left ventricle has severely decreased function. The left ventricle demonstrates global hypokinesis. Definity contrast agent was given IV to delineate the left ventricular endocardial borders. The left ventricular internal cavity size was moderately dilated. There is no left ventricular hypertrophy. Left ventricular diastolic parameters are consistent with Grade III diastolic dysfunction  (restrictive). Right Ventricle: The right ventricular size is normal. No increase in right ventricular wall thickness. Right ventricular systolic function is moderately reduced. There is moderately elevated pulmonary artery systolic pressure. The tricuspid regurgitant velocity is 2.98 m/s, and with an assumed right atrial pressure of 15 mmHg, the estimated right ventricular systolic pressure is 123XX123 mmHg. Left Atrium: Left atrial size was normal in size. Right Atrium: Right atrial size was normal in size. Pericardium: There is no evidence of pericardial effusion. Mitral Valve: The mitral valve is normal in structure. Mild to moderate mitral valve regurgitation. No evidence of mitral valve stenosis. Tricuspid Valve: The tricuspid valve is normal in structure. Tricuspid valve regurgitation is severe. No evidence of tricuspid stenosis. Aortic Valve: The aortic valve is normal in structure. Aortic valve regurgitation is trivial. No aortic stenosis is present. Pulmonic Valve: The pulmonic valve was normal in structure. Pulmonic valve regurgitation is not visualized. No evidence of pulmonic stenosis. Aorta: The aortic root is normal in size and structure. Venous: The inferior vena cava is dilated in size with less than 50% respiratory variability, suggesting right atrial pressure of 15 mmHg. IAS/Shunts: No atrial level shunt detected by color flow Doppler.  LEFT VENTRICLE PLAX 2D LVIDd:         6.00 cm Diastology LVIDs:         5.60 cm LV e' lateral: 10.10 cm/s LV PW:         1.10 cm LV IVS:        0.70 cm  RIGHT VENTRICLE TAPSE (M-mode): 1.0 cm LEFT ATRIUM         Index LA diam:    4.60 cm 2.14 cm/m  MR Peak grad:    62.1 mmHg   TRICUSPID VALVE MR Mean grad:    40.0 mmHg   TR Peak grad:   35.5 mmHg MR Vmax:         394.00 cm/s TR Vmax:        298.00 cm/s MR Vmean:        297.0 cm/s MR PISA:         1.57 cm MR PISA Eff ROA: 16 mm MR PISA Radius:  0.50 cm Skeet Latch MD Electronically signed by Jonelle Sidle  Oval Linsey MD  Signature Date/Time: 08/27/2020/5:28:48 PM    Final         Scheduled Meds: . amiodarone  200 mg Oral Daily  . aspirin  81 mg Oral Daily   Or  . aspirin  300 mg Rectal Daily  . atorvastatin  80 mg Oral Daily  . gabapentin  300 mg Oral TID  . irbesartan  37.5 mg Oral Daily  . metoprolol succinate  25 mg Oral Daily  . potassium chloride  40 mEq Oral BID  . QUEtiapine  100 mg Oral QHS  . senna-docusate  1 tablet Oral BID  . spironolactone  12.5 mg Oral Daily  . tamsulosin  0.4 mg Oral Daily  . torsemide  20 mg Oral Daily   Continuous Infusions:    LOS: 1 day    Time spent: 35 minutes    Barb Merino, MD [facility] Pager [phone]

## 2020-08-28 NOTE — Progress Notes (Signed)
Occupational Therapy Evaluation Patient Details Name: Cody Guzman MRN: [id number] DOB: 1965/08/19 Today's Date: 08/28/2020    History of Present Illness pt is a 55 y/o male [facility] with acute onset of Chest discomfort and difficulty speaking.  MRI shows patchy scattered acute infartcts in the left frontal and right parietal lobes, likely embolic in nature.   PMHx: CAD, ICM, non-hodgkin's lymphoma in remmision, migraines, CKD2   Clinical Impression   PTA Pt in prison and modified independent @ cane level. Pt with various levels of effort throughout session. Demonstrates increased weakness RUE although using functionally. Able to ambulate to sink although once at sink for oral care, pt demonstrated increased WOB and began to demonstrate minimal knee buckling. Attempted to position pt in chair however pt then stated "I can make it back to the bed". Once close to bed, pt unsafely fell into bed. Educated pt on importance of safely descending to bed.  At this time recommend follow up with [facility].     Follow Up Recommendations  Home health OT    Equipment Recommendations  Tub/shower seat    Recommendations for Other Services       Precautions / Restrictions Precautions Precautions: Fall      Mobility Bed Mobility Overal bed mobility: Modified Independent             General bed mobility comments: use of UE's appropriately from a flat bed, no rails, no assist.    Transfers Overall transfer level: Needs assistance Equipment used: None Transfers: Sit to/from Stand Sit to Stand: Min guard              Balance Overall balance assessment: Needs assistance Sitting-balance support: No upper extremity supported;Feet unsupported Sitting balance-Leahy Scale: Good     Standing balance support: No upper extremity supported;During functional activity Standing balance-Leahy Scale: Fair Standing balance comment: Initially using bed frame then  improved                           ADL either performed or assessed with clinical judgement   ADL Overall ADL's : Needs assistance/impaired Eating/Feeding: Modified independent   Grooming: Wash/dry face;Oral care;Standing;Minimal assistance (Min A when standing)   Upper Body Bathing: Set up;Sitting   Lower Body Bathing: Min guard;Sit to/from stand   Upper Body Dressing : Set up;Sitting   Lower Body Dressing: Min guard;Sit to/from stand Lower Body Dressing Details (indicate cue type and reason): Difficult to assess due to shackles Toilet Transfer: Minimal assistance;RW   Toileting- Clothing Manipulation and Hygiene: Set up       Functional mobility during ADLs: Minimal assistance;Rolling walker;Cueing for safety General ADL Comments: Stood at sink to complete grooming tasks and pt became less resonsive with incresed WOB. Asked for chair then pt stated he did not need a chair, completed the task then returned to bed     Vision Baseline Vision/History: Wears glasses Wears Glasses: At all times Patient Visual Report: Blurring of vision Vision Assessment?: Yes Eye Alignment: Within Functional Limits Ocular Range of Motion: Within Functional Limits Alignment/Gaze Preference: Within Defined Limits Tracking/Visual Pursuits: Decreased smoothness of horizontal tracking;Decreased smoothness of vertical tracking Saccades: Within functional limits Convergence: Within functional limits Visual Fields: No apparent deficits     Perception Perception Perception Tested?:  (L bias; difficulty maintaining midline; may be shifting weight due to back discomfort; not consistent)   Praxis Praxis Praxis tested?: Within functional limits    Pertinent Vitals/Pain  Pain Assessment: Faces Faces Pain Scale: Hurts little more Pain Location: throat when he swallows Pain Descriptors / Indicators: Grimacing;Guarding;Sharp     Hand Dominance Left   Extremity/Trunk Assessment Upper  Extremity Assessment Upper Extremity Assessment: RUE deficits/detail RUE Deficits / Details: RUE appeasr overall weaker than LUE however using functionally without difficulty   Lower Extremity Assessment Lower Extremity Assessment: Defer to PT evaluation   Cervical / Trunk Assessment Cervical / Trunk Assessment: Other exceptions (L bis)   Communication Communication Communication: Expressive difficulties (difficult to understand at times)   Cognition Arousal/Alertness: Awake/alert Behavior During Therapy: WFL for tasks assessed/performed (frustrated) Overall Cognitive Status: Difficult to assess (Did not assess formally for higher level disfunction.)                                 General Comments: Slow processing; decreased awareness he was leaning L;   General Comments       Exercises     Shoulder Instructions      Home Living Family/patient expects to be discharged to:: [facility]     Type of Home: Other(Comment) (will likely d/c to the medical unit in jail)                                  Prior Functioning/Environment Level of Independence: Independent with assistive device(s)        Comments: walks with a cane        OT Problem List: Decreased activity tolerance;Impaired vision/perception;Impaired balance (sitting and/or standing);Decreased cognition;Decreased safety awareness;Decreased knowledge of use of DME or AE;Pain      OT Treatment/Interventions: Self-care/ADL training;Therapeutic exercise;Neuromuscular education;DME and/or AE instruction;Therapeutic activities;Cognitive remediation/compensation;Visual/perceptual remediation/compensation;Patient/family education;Balance training    OT Goals(Current goals can be found in the care plan section) Acute Rehab OT Goals Patient Stated Goal: get back to independent OT Goal Formulation: With patient Time For Goal Achievement: 09/11/20 Potential to Achieve Goals: Good  OT  Frequency: Min 2X/week   Barriers to D/C: Other (comment) (prison)          Co-evaluation              AM-PAC OT "6 Clicks" Daily Activity     Outcome Measure Help from another person eating meals?: None Help from another person taking care of personal grooming?: A Little Help from another person toileting, which includes using toliet, bedpan, or urinal?: A Little Help from another person bathing (including washing, rinsing, drying)?: A Little Help from another person to put on and taking off regular upper body clothing?: A Little Help from another person to put on and taking off regular lower body clothing?: A Little 6 Click Score: 19   End of Session Equipment Utilized During Treatment: Gait belt;Rolling walker Nurse Communication: Mobility status  Activity Tolerance: Patient tolerated treatment well Patient left: in bed;with call bell/phone within reach;with bed alarm set;Other (comment) (officer present)  OT Visit Diagnosis: Unsteadiness on feet (R26.81);Other abnormalities of gait and mobility (R26.89);Muscle weakness (generalized) (M62.81);Other symptoms and signs involving cognitive function;Pain Pain - part of body:  (throat)                Time: 2353-6144 OT Time Calculation (min): 26 min Charges:  OT General Charges $OT Visit: 1 Visit OT Evaluation $OT Eval Moderate Complexity: 1 Mod OT Treatments $Self Care/Home Management : 8-22 mins  [facility], OT/L  Acute OT Clinical Specialist [place] Pager [phone] Office [phone]   [facility] 08/28/2020, 5:34 PM

## 2020-08-28 NOTE — Progress Notes (Signed)
Modified Barium Swallow Progress Note  Patient Details  Name: Cody Guzman MRN: [id number] Date of Birth: 1965-10-09  Today's Date: 08/28/2020  Modified Barium Swallow completed.  Full report located under Chart Review in the Imaging Section.  Brief recommendations include the following:  Clinical Impression  Pt presents with a functional oropharyngeal swallow marked by mildly prolonged but functional mastication of solids, timely swallow response, adequate pharyngeal squeeze/clearance, and reliable airway protection with no aspiration. Notable were two instances of backflow from cervical esophagus, through UES and into lower pharynx- this material was re-swallowed and was NOT aspirated.  This finding explains multiple sub-swallows noted during yesterday's clinical swallow evaluation.  He may benefit from esophageal w/u at some point; he will not need further SLP f/u for swallowing. We will see him for speech therapy only. Recommend regular solids, thin liquids.   Swallow Evaluation Recommendations   Recommended Consults: Consider esophageal assessment   SLP Diet Recommendations: Regular solids;Thin liquid   Liquid Administration via: Cup;Straw   Medication Administration: Whole meds with liquid   Supervision: Patient able to self feed           Oral Care Recommendations: Oral care BID      Cody Guzman L. [name], [place] Office number [phone] Pager [phone]   Cody Guzman 08/28/2020,12:20 PM

## 2020-08-28 NOTE — Telephone Encounter (Signed)
Call was placed to [facility] and he will re fax over paperwork that needs to be completed.

## 2020-08-28 NOTE — Telephone Encounter (Signed)
Copied from [place] [id number]. Topic: General - Other >> Aug 15, 2020  1:40 PM Celene Kras wrote: Reason for CRM: Cody Guzman, from [place], calling to verify that a fax was received for pt. He states that they sent over paperwork " Addendum to Notes". Please advise . >> Aug 22, 2020  4:44 PM [place], Brayton Layman A wrote: [facility] called back to check up on this request, stated the additional documents need to be faxed to either [phone] or [phone]. >> Aug 17, 2020  3:52 PM Jodie Echevaria wrote: Cody Guzman with [facility] called in inquiring about paperwork that was requested on 08/15/20. Can be reached at Ph# [phone]
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
## 2020-08-28 NOTE — Progress Notes (Signed)
Progress Note  Patient Name: Cody Guzman Date of Encounter: 08/28/2020  [facility] Cardiologist: Elouise Munroe, MD   Subjective   No shortness of breath, no chest pain.  Was able to say thank you this morning.  Inpatient Medications    Scheduled Meds: . amiodarone  200 mg Oral Daily  . aspirin  81 mg Oral Daily   Or  . aspirin  300 mg Rectal Daily  . atorvastatin  80 mg Oral Daily  . gabapentin  300 mg Oral TID  . irbesartan  37.5 mg Oral Daily  . metoprolol succinate  25 mg Oral Daily  . potassium chloride  40 mEq Oral BID  . QUEtiapine  100 mg Oral QHS  . senna-docusate  1 tablet Oral BID  . spironolactone  12.5 mg Oral Daily  . tamsulosin  0.4 mg Oral Daily  . torsemide  20 mg Oral Daily   Continuous Infusions: . magnesium sulfate bolus IVPB 1 g (08/28/20 0858)   PRN Meds: acetaminophen **OR** acetaminophen, albuterol, ondansetron **OR** ondansetron (ZOFRAN) IV   Vital Signs    Vitals:   08/28/20 0521 08/28/20 0734 08/28/20 0855 08/28/20 0902  BP:  (!) 80/62 118/87   Pulse:  88  95  Resp: 19 (!) 21 (!) 40   Temp:  98.1 F (36.7 C) 98.1 F (36.7 C)   TempSrc:  Oral Oral   SpO2:  93% 94%   Weight:      Height:        Intake/Output Summary (Last 24 hours) at 08/28/2020 0938 Last data filed at 08/28/2020 0500 Gross per 24 hour  Intake 120 ml  Output 550 ml  Net -430 ml   Last 3 Weights 08/28/2020 08/27/2020 08/26/2020  Weight (lbs) 230 lb 6.1 oz 224 lb 10.4 oz 238 lb 1.6 oz  Weight (kg) 104.5 kg 101.9 kg 108 kg      Telemetry    Currently slow atrial flutter with variable conduction, mostly 2-1 at 94 bpm- Personally Reviewed  ECG    Atrial flutter 93 bpm variable conduction- Personally Reviewed  Physical Exam   GEN: No acute distress.   Neck: No JVD Cardiac:  Mostly regular, 2/6 holosystolic murmur, no rubs, or gallops.  Respiratory: Clear to auscultation bilaterally. GI: Soft, nontender, non-distended  MS: No edema; No  deformity. Neuro:  Nonfocal  Psych: Normal affect   Labs    High Sensitivity Troponin:   Recent Labs  Lab 08/26/20 2329 08/27/20 0150  TROPONINIHS 36* 40*      Chemistry Recent Labs  Lab 08/26/20 2329 08/27/20 0922 08/27/20 1150 08/28/20 0038  NA 140 139  --  139  K 2.8* 3.4* 3.4* 3.3*  CL 107 106  --  108  CO2 23 23  --  22  GLUCOSE 110* 126*  --  97  BUN 9 9  --  8  CREATININE 1.22 1.26*  --  1.26*  CALCIUM 9.2 9.4  --  9.2  PROT 7.4 7.0  --  6.2*  ALBUMIN 4.0 3.7  --  3.4*  AST 21 19  --  18  ALT 15 14  --  12  ALKPHOS 76 66  --  70  BILITOT 2.6* 2.9*  --  3.3*  GFRNONAA >60 >60  --  >60  ANIONGAP 10 10  --  9     Hematology Recent Labs  Lab 08/26/20 2329 08/27/20 0922 08/28/20 0038  WBC 7.3 5.9 6.2  RBC 4.62 4.48 4.09*  HGB 13.9 13.4 12.1*  HCT 41.8 39.9 36.9*  MCV 90.5 89.1 90.2  MCH 30.1 29.9 29.6  MCHC 33.3 33.6 32.8  RDW 15.7* 15.8* 15.9*  PLT 253 213 228    BNP Recent Labs  Lab 08/26/20 2329  BNP 973.8*     DDimer No results for input(s): DDIMER in the last 168 hours.   Radiology    MR ANGIO HEAD WO CONTRAST  Result Date: 08/27/2020 CLINICAL DATA:  55 year old male with acute onset chest pain and difficulty speaking status post ventricular tachycardia. Lost the ability to speak. EXAM: MRI HEAD WITHOUT CONTRAST MRA HEAD WITHOUT CONTRAST TECHNIQUE: Multiplanar, multiecho pulse sequences of the brain and surrounding structures were obtained without intravenous contrast. Angiographic images of the head were obtained using MRA technique without contrast. COMPARISON:  Brain MRI 06/17/2020. FINDINGS: MRI HEAD FINDINGS Brain: Small areas of cortical and white matter diffusion restriction are new since February and involve the left frontal operculum, left middle frontal gyrus, also scattered in the right parietal lobe. Associated T2 and FLAIR hyperintensity in the greatest areas of involvement. No acute intracranial hemorrhage or mass effect.  Underlying chronic encephalomalacia in the right inferior frontal gyrus, scattered and patchy bilateral white matter T2 and FLAIR hyperintensity. Chronic anterior left frontal lobe developmental venous anomaly suspected. Stable T2 heterogeneity in the bilateral basal ganglia. Small chronic infarcts in the posterior cerebellum. No midline shift, mass effect, evidence of mass lesion, ventriculomegaly, extra-axial collection or acute intracranial hemorrhage. Cervicomedullary junction and pituitary are within normal limits. Vascular: Major intracranial vascular flow voids are stable since February. See MRA findings below. Skull and upper cervical spine: Stable, negative. Sinuses/Orbits: Stable, negative. Other: Mastoids remain clear.  Negative visible scalp and face. MRA HEAD FINDINGS Antegrade flow in the posterior circulation with mildly tortuous vertebral and basilar arteries. Mildly dominant right V4. Normal PICA origins. No vertebrobasilar stenosis. Normal SCA and PCA origins. Posterior communicating arteries are diminutive or absent. Bilateral PCA branches are within normal limits. Antegrade flow in both ICA siphons. Mild to moderate bilateral siphon irregularity in keeping with atherosclerosis, but no hemodynamically significant siphon stenosis identified. Patent carotid termini. Normal MCA and ACA origins. Mild irregularity left ACA A1 segment. Diminutive or absent anterior communicating artery. MCA M1 segments are within normal limits. MCA bi/trifurcations are patent without stenosis. Visible bilateral MCA and ACA branches are within normal limits. No left MCA branch occlusion identified. IMPRESSION: 1. Positive for patchy, scattered acute infarcts in the Left Frontal (operculum, middle frontal gyrus) and Right Parietal lobes. Favor embolic etiology in this clinical setting. No associated hemorrhage or mass effect. 2. MRA is negative for large vessel occlusion. Evidence of intracranial atherosclerosis but no  hemodynamically significant stenosis identified. 3. Underlying chronic ischemic disease otherwise stable since February. Electronically Signed   By: Genevie Ann M.D.   On: 08/27/2020 07:22   MR BRAIN WO CONTRAST  Result Date: 08/27/2020 CLINICAL DATA:  55 year old male with acute onset chest pain and difficulty speaking status post ventricular tachycardia. Lost the ability to speak. EXAM: MRI HEAD WITHOUT CONTRAST MRA HEAD WITHOUT CONTRAST TECHNIQUE: Multiplanar, multiecho pulse sequences of the brain and surrounding structures were obtained without intravenous contrast. Angiographic images of the head were obtained using MRA technique without contrast. COMPARISON:  Brain MRI 06/17/2020. FINDINGS: MRI HEAD FINDINGS Brain: Small areas of cortical and white matter diffusion restriction are new since February and involve the left frontal operculum, left middle frontal gyrus, also scattered in the right parietal lobe. Associated T2 and  FLAIR hyperintensity in the greatest areas of involvement. No acute intracranial hemorrhage or mass effect. Underlying chronic encephalomalacia in the right inferior frontal gyrus, scattered and patchy bilateral white matter T2 and FLAIR hyperintensity. Chronic anterior left frontal lobe developmental venous anomaly suspected. Stable T2 heterogeneity in the bilateral basal ganglia. Small chronic infarcts in the posterior cerebellum. No midline shift, mass effect, evidence of mass lesion, ventriculomegaly, extra-axial collection or acute intracranial hemorrhage. Cervicomedullary junction and pituitary are within normal limits. Vascular: Major intracranial vascular flow voids are stable since February. See MRA findings below. Skull and upper cervical spine: Stable, negative. Sinuses/Orbits: Stable, negative. Other: Mastoids remain clear.  Negative visible scalp and face. MRA HEAD FINDINGS Antegrade flow in the posterior circulation with mildly tortuous vertebral and basilar arteries. Mildly  dominant right V4. Normal PICA origins. No vertebrobasilar stenosis. Normal SCA and PCA origins. Posterior communicating arteries are diminutive or absent. Bilateral PCA branches are within normal limits. Antegrade flow in both ICA siphons. Mild to moderate bilateral siphon irregularity in keeping with atherosclerosis, but no hemodynamically significant siphon stenosis identified. Patent carotid termini. Normal MCA and ACA origins. Mild irregularity left ACA A1 segment. Diminutive or absent anterior communicating artery. MCA M1 segments are within normal limits. MCA bi/trifurcations are patent without stenosis. Visible bilateral MCA and ACA branches are within normal limits. No left MCA branch occlusion identified. IMPRESSION: 1. Positive for patchy, scattered acute infarcts in the Left Frontal (operculum, middle frontal gyrus) and Right Parietal lobes. Favor embolic etiology in this clinical setting. No associated hemorrhage or mass effect. 2. MRA is negative for large vessel occlusion. Evidence of intracranial atherosclerosis but no hemodynamically significant stenosis identified. 3. Underlying chronic ischemic disease otherwise stable since February. Electronically Signed   By: Genevie Ann M.D.   On: 08/27/2020 07:22   DG Chest Port 1 View  Result Date: 08/26/2020 CLINICAL DATA:  Chest pain and ventricular tachycardia. EXAM: PORTABLE CHEST 1 VIEW COMPARISON:  06/16/2020 FINDINGS: Cardiac enlargement. No vascular congestion, edema, or consolidation. No pleural effusions. No pneumothorax. Mediastinal contours appear intact. IMPRESSION: Cardiac enlargement.  No active pulmonary disease. Electronically Signed   By: Lucienne Capers M.D.   On: 08/26/2020 23:48   ECHOCARDIOGRAM LIMITED  Result Date: 08/27/2020    ECHOCARDIOGRAM REPORT   Patient Name:   Cody Guzman Date of Exam: 08/27/2020 Medical Rec #:  [id number]           Height:       68.0 in Accession #:    [id number]          Weight:       224.6 lb Date  of Birth:  [birth year]            BSA:          2.147 m Patient Age:    77 years            BP:           113/90 mmHg Patient Gender: M                   HR:           94 bpm. Exam Location:  Inpatient Procedure: Intracardiac Opacification Agent, Limited Echo, Limited Color Doppler            and Cardiac Doppler Indications:    Stroke  History:        Patient has prior history of Echocardiogram examinations, most  recent 06/20/2020. CHF; CAD.  Sonographer:    Mikki Santee RDCS (AE) Referring Phys: [id number] [place]  1. Left ventricular ejection fraction, by estimation, is <20%. The left ventricle has severely decreased function. The left ventricle demonstrates global hypokinesis. The left ventricular internal cavity size was moderately dilated. Left ventricular diastolic parameters are consistent with Grade III diastolic dysfunction (restrictive).  2. Right ventricular systolic function is moderately reduced. The right ventricular size is normal. There is moderately elevated pulmonary artery systolic pressure.  3. The mitral valve is normal in structure. Mild to moderate mitral valve regurgitation. No evidence of mitral stenosis.  4. Tricuspid valve regurgitation is severe.  5. The aortic valve is normal in structure. Aortic valve regurgitation is trivial. No aortic stenosis is present.  6. The inferior vena cava is dilated in size with <50% respiratory variability, suggesting right atrial pressure of 15 mmHg. FINDINGS  Left Ventricle: Left ventricular ejection fraction, by estimation, is <20%. The left ventricle has severely decreased function. The left ventricle demonstrates global hypokinesis. Definity contrast agent was given IV to delineate the left ventricular endocardial borders. The left ventricular internal cavity size was moderately dilated. There is no left ventricular hypertrophy. Left ventricular diastolic parameters are consistent with Grade III diastolic dysfunction  (restrictive). Right Ventricle: The right ventricular size is normal. No increase in right ventricular wall thickness. Right ventricular systolic function is moderately reduced. There is moderately elevated pulmonary artery systolic pressure. The tricuspid regurgitant velocity is 2.98 m/s, and with an assumed right atrial pressure of 15 mmHg, the estimated right ventricular systolic pressure is 123XX123 mmHg. Left At[facility]: Left atrial size was normal in size. Right [facility]: Right atrial size was normal in size. Pericardium: There is no evidence of pericardial effusion. Mitral Valve: The mitral valve is normal in structure. Mild to moderate mitral valve regurgitation. No evidence of mitral valve stenosis. Tricuspid Valve: The tricuspid valve is normal in structure. Tricuspid valve regurgitation is severe. No evidence of tricuspid stenosis. Aortic Valve: The aortic valve is normal in structure. Aortic valve regurgitation is trivial. No aortic stenosis is present. Pulmonic Valve: The pulmonic valve was normal in structure. Pulmonic valve regurgitation is not visualized. No evidence of pulmonic stenosis. Aorta: The aortic root is normal in size and structure. Venous: The inferior vena cava is dilated in size with less than 50% respiratory variability, suggesting right atrial pressure of 15 mmHg. IAS/Shunts: No atrial level shunt detected by color flow Doppler.  LEFT VENTRICLE PLAX 2D LVIDd:         6.00 cm Diastology LVIDs:         5.60 cm LV e' lateral: 10.10 cm/s LV PW:         1.10 cm LV IVS:        0.70 cm  RIGHT VENTRICLE TAPSE (M-mode): 1.0 cm LEFT ATRIUM         Index LA diam:    4.60 cm 2.14 cm/m  MR Peak grad:    62.1 mmHg   TRICUSPID VALVE MR Mean grad:    40.0 mmHg   TR Peak grad:   35.5 mmHg MR Vmax:         394.00 cm/s TR Vmax:        298.00 cm/s MR Vmean:        297.0 cm/s MR PISA:         1.57 cm MR PISA Eff ROA: 16 mm MR PISA Radius:  0.50 cm Skeet Latch MD Electronically signed by Jonelle Sidle  Oval Linsey MD  Signature Date/Time: 08/27/2020/5:28:48 PM    Final     Cardiac Studies   Echocardiogram 08/27/2020:    1. Left ventricular ejection fraction, by estimation, is <20%. The left  ventricle has severely decreased function. The left ventricle demonstrates  global hypokinesis. The left ventricular internal cavity size was  moderately dilated. Left ventricular  diastolic parameters are consistent with Grade III diastolic dysfunction  (restrictive).  2. Right ventricular systolic function is moderately reduced. The right  ventricular size is normal. There is moderately elevated pulmonary artery  systolic pressure.  3. The mitral valve is normal in structure. Mild to moderate mitral valve  regurgitation. No evidence of mitral stenosis.  4. Tricuspid valve regurgitation is severe.  5. The aortic valve is normal in structure. Aortic valve regurgitation is  trivial. No aortic stenosis is present.  6. The inferior vena cava is dilated in size with <50% respiratory  variability, suggesting right atrial pressure of 15 mmHg.   Patient Profile     55 y.o. male with known ischemic cardiomyopathy EF 20% with atrial flutter, previously underwent cardioversion, on amiodarone, CAD post MI in 2014 and 2016, moderate mitral regurgitation here with acute stroke resulting in aphasia  Assessment & Plan    Chronic systolic heart failure/ischemic cardiomyopathy - EF 20% no change from prior on echocardiogram. - Continue with goal-directed medical therapy.  Appears euvolemic.  On low-dose irbesartan, metoprolol, spironolactone, Demadex 20 mg a day.  Atrial flutter - Slow atrial flutter noted, amiodarone on board.  Good overall rate control below 100.  Had cardioversion in the past.  No evidence of VT. - Recommend repeating potassium to 4. - Okay with Toprol 25 mg a day as well.  Chronic anticoagulation - Resume Eliquis per stroke team -On aspirin as well  CAD/stroke - Continue with atorvastatin  80 mg  Acute stroke - Embolic.  Able to say thank you today.  Aphasia.   For questions or updates, please contact [place] Please consult [email or web address] for contact info under        Signed, Candee Furbish, MD  08/28/2020, 9:38 AM

## 2020-08-28 NOTE — Plan of Care (Signed)

## 2020-08-28 NOTE — Evaluation (Signed)
Physical Therapy Evaluation Patient Details Name: Cody Guzman MRN: [id number] DOB: 1966/03/05 Today's Date: 08/28/2020   History of Present Illness  pt is a 55 y/o male [facility] with acute onset of Chest discomfort and difficulty speaking.  MRI shows patchy scattered acute infartcts in the left frontal and right parietal lobes, likely embolic in nature.   PMHx: CAD, ICM, non-hodgkin's lymphoma in remmision, migraines, CKD2  Clinical Impression  Pt admitted with/for CP and trouble speaking/s/s of stroke.  Imaging confirmed patchy infarcts L frontal and R parietal.  Mostly dysarthric, but also with mild weakness/decreased coordination..  Pt currently limited functionally due to the problems listed below.  (see problems list.)  Pt will benefit from PT to maximize function and safety to be able to get home safely with available assist.     Follow Up Recommendations Home health PT;Other (comment) (at the jail)    Equipment Recommendations  Other (comment) (TBD next venue.)    Recommendations for Other Services       Precautions / Restrictions Precautions Precautions: Fall      Mobility  Bed Mobility Overal bed mobility: Modified Independent             General bed mobility comments: use of UE's appropriately from a flat bed, no rails, no assist.    Transfers Overall transfer level: Needs assistance Equipment used: None Transfers: Sit to/from Stand Sit to Stand: Supervision         General transfer comment: use of UE's appropriately, guard for initial posterior sway, initial use of the bed frame to balance.  Improvements as he remained standing.  Ambulation/Gait Ambulation/Gait assistance: Min assist Gait Distance (Feet): 60 Feet Assistive device: None Gait Pattern/deviations: Step-through pattern   Gait velocity interpretation: <1.31 ft/sec, indicative of household ambulator General Gait Details: short step length, antalgic on the  right, mild SOB.  On RA, sats remained appropriately in the 90's, but HR tachy up into the 120's during gait..  Stairs            Wheelchair Mobility    Modified Rankin (Stroke Patients Only) Modified Rankin (Stroke Patients Only) Modified Rankin: Moderately severe disability     Balance Overall balance assessment: Needs assistance Sitting-balance support: No upper extremity supported;Feet unsupported Sitting balance-Leahy Scale: Good     Standing balance support: No upper extremity supported;During functional activity Standing balance-Leahy Scale: Fair Standing balance comment: Initially using bed frame then improved                 Standardized Balance Assessment Standardized Balance Assessment : Berg Balance Test Berg Balance Test Sit to Stand: Able to stand  independently using hands Standing Unsupported: Able to stand 30 seconds unsupported Sitting with Back Unsupported but Feet Supported on Floor or Stool: Able to sit 2 minutes under supervision Stand to Sit: Controls descent by using hands Transfers: Able to transfer safely, definite need of hands Standing Unsupported with Eyes Closed: Able to stand 10 seconds with supervision Standing Ubsupported with Feet Together: Able to place feet together independently but unable to hold for 30 seconds From Standing Position, Pick up Object from Floor: Able to pick up shoe, needs supervision From Standing Position, Turn to Look Behind Over each Shoulder: Looks behind one side only/other side shows less weight shift Turn 360 Degrees: Able to turn 360 degrees safely but slowly         Pertinent Vitals/Pain Pain Assessment: Faces Faces Pain Scale: Hurts little more Pain Descriptors /  Indicators: Grimacing;Guarding;Sharp Pain Intervention(s): Monitored during session;Limited activity within patient's tolerance    Home Living Family/patient expects to be discharged to:: [facility]     Type of Home:  Other(Comment) (will likely d/c to the medical unit in jail)                Prior Function Level of Independence: Independent with assistive device(s)         Comments: walks with a cane     Hand Dominance   Dominant Hand: Left    Extremity/Trunk Assessment   Upper Extremity Assessment Upper Extremity Assessment: Defer to OT evaluation;RUE deficits/detail;LUE deficits/detail RUE Deficits / Details: pt reports no significant difference in light touch left to right UE    Lower Extremity Assessment Lower Extremity Assessment: RLE deficits/detail;LLE deficits/detail RLE Deficits / Details: can lift weakly against gravity, mild proximal weakness, mildly stiff.  Pt reports no significant difference in Light touch from left to right LE. RLE Coordination: decreased fine motor LLE Coordination: decreased fine motor       Communication   Communication: No difficulties  Cognition Arousal/Alertness: Awake/alert Behavior During Therapy: WFL for tasks assessed/performed (frustrated) Overall Cognitive Status: Within Functional Limits for tasks assessed (Did not assess formally for higher level disfunction.)                                        General Comments General comments (skin integrity, edema, etc.): Leg chains may have limited gait quality, but believe "gouty" pain contributed more along with no AD.    Exercises     Assessment/Plan    PT Assessment Patient needs continued PT services  PT Problem List Decreased strength;Decreased activity tolerance;Decreased balance;Decreased mobility;Decreased coordination;Pain       PT Treatment Interventions Gait training;Functional mobility training;Therapeutic activities;Balance training;Neuromuscular re-education;Patient/family education    PT Goals (Current goals can be found in the Care Plan section)  Acute Rehab PT Goals Patient Stated Goal: get back to independent PT Goal Formulation: With  patient Time For Goal Achievement: 09/11/20 Potential to Achieve Goals: Good    Frequency Min 3X/week   Barriers to discharge        Co-evaluation               AM-PAC PT "6 Clicks" Mobility  Outcome Measure Help needed turning from your back to your side while in a flat bed without using bedrails?: None Help needed moving from lying on your back to sitting on the side of a flat bed without using bedrails?: None Help needed moving to and from a bed to a chair (including a wheelchair)?: A Little Help needed standing up from a chair using your arms (e.g., wheelchair or bedside chair)?: A Little Help needed to walk in hospital room?: A Little Help needed climbing 3-5 steps with a railing? : A Lot 6 Click Score: 19    End of Session   Activity Tolerance: Patient tolerated treatment well;Patient limited by pain Patient left: in bed;with call bell/phone within reach;Other (comment) (Corporate treasurer present) Nurse Communication: Mobility status PT Visit Diagnosis: Unsteadiness on feet (R26.81);Other abnormalities of gait and mobility (R26.89);Difficulty in walking, not elsewhere classified (R26.2);Other symptoms and signs involving the nervous system (R29.898)    Time: 6045-4098 PT Time Calculation (min) (ACUTE ONLY): 36 min   Charges:   PT Evaluation $PT Eval Moderate Complexity: 1 Mod PT Treatments $Gait Training: 8-22 mins  08/28/2020  Ginger Carne., PT Acute Rehabilitation Services [phone]  (pager) [phone]  (office)  Tessie Fass Sheran Newstrom 08/28/2020, 1:59 PM

## 2020-08-29 DIAGNOSIS — I6310 Cerebral infarction due to embolism of unspecified precerebral artery: Secondary | ICD-10-CM | POA: Diagnosis not present

## 2020-08-29 DIAGNOSIS — R4701 Aphasia: Secondary | ICD-10-CM | POA: Diagnosis not present

## 2020-08-29 DIAGNOSIS — I483 Typical atrial flutter: Secondary | ICD-10-CM | POA: Diagnosis not present

## 2020-08-29 LAB — BASIC METABOLIC PANEL
Anion gap: 10 (ref 5–15)
BUN: 10 mg/dL (ref 6–20)
CO2: 25 mmol/L (ref 22–32)
Calcium: 9.3 mg/dL (ref 8.9–10.3)
Chloride: 108 mmol/L (ref 98–111)
Creatinine, Ser: 1.57 mg/dL — ABNORMAL HIGH (ref 0.61–1.24)
GFR, Estimated: 51 mL/min — ABNORMAL LOW (ref >60)
Glucose, Bld: 104 mg/dL — ABNORMAL HIGH (ref 70–99)
Potassium: 3.2 mmol/L — ABNORMAL LOW (ref 3.5–5.1)
Sodium: 143 mmol/L (ref 135–145)

## 2020-08-29 LAB — MAGNESIUM: Magnesium: 1.4 mg/dL — ABNORMAL LOW (ref 1.7–2.4)

## 2020-08-29 MED ORDER — QUETIAPINE FUMARATE 100 MG PO TABS
200.0000 mg | ORAL_TABLET | Freq: Every day | ORAL | Status: DC
  Administered 2020-08-29: 200 mg via ORAL
  Filled 2020-08-29: qty 2

## 2020-08-29 MED ORDER — POTASSIUM CHLORIDE CRYS ER 20 MEQ PO TBCR
40.0000 meq | EXTENDED_RELEASE_TABLET | Freq: Three times a day (TID) | ORAL | Status: DC
  Administered 2020-08-29 (×2): 40 meq via ORAL
  Filled 2020-08-29 (×2): qty 2

## 2020-08-29 MED ORDER — MAGNESIUM OXIDE 400 (241.3 MG) MG PO TABS
400.0000 mg | ORAL_TABLET | Freq: Two times a day (BID) | ORAL | Status: DC
  Administered 2020-08-29 – 2020-08-31 (×5): 400 mg via ORAL
  Filled 2020-08-29 (×5): qty 1

## 2020-08-29 MED ORDER — MAGNESIUM SULFATE 2 GM/50ML IV SOLN
2.0000 g | Freq: Once | INTRAVENOUS | Status: AC
  Administered 2020-08-29: 2 g via INTRAVENOUS
  Filled 2020-08-29: qty 50
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
## 2020-08-29 NOTE — TOC Progression Note (Addendum)
Transition of Care [facility]) - Progression Note    Patient Details  Name: Cody Guzman MRN: [id number] Date of Birth: 1966/01/05  Transition [facility] Care [facility]) CM/SW Contact  Zenon Mayo, RN Phone Number: 08/29/2020, 3:36 PM  Clinical Narrative:    NCM spoke with Colletta [place] [facility], faxed the pt eval and ot eval and st eval to her. She states please do not dc this patient until they see if they can take him back.   4/28- per Colletta Maryland with [facility] states to have MD call Dr. Lorane Gell at  [phone] for peer to peer to [facility] because they can not take care of patient needs at the jail.  NCM gave MD this information.  He did peer to peer and they will take patient when they have a bed available.  NCM informed Colletta Maryland of this information she states that the patient may be discharged tomorrow.  Colletta Maryland states to please call her at [phone] when patient is discharged.         Expected Discharge Plan and Services                                                 Social Determinants of Health (SDOH) Interventions    Readmission Risk Interventions No flowsheet data found.
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
## 2020-08-29 NOTE — Progress Notes (Signed)
Physical Therapy Treatment Patient Details Name: Cody Guzman MRN: [id number] DOB: 13-Sep-1965 Today's Date: 08/29/2020    History of Present Illness pt is a 55 y/o male [facility] with acute onset of Chest discomfort and difficulty speaking.  MRI shows patchy scattered acute infartcts in the left frontal and right parietal lobes, likely embolic in nature.   PMHx: CAD, ICM, non-hodgkin's lymphoma in remmision, migraines, CKD2    PT Comments    Progressing toward goals.  Emphasis on transitions, sit to stand safety, progression of gait stability/stamina with challenges to balance.   Follow Up Recommendations  Home health PT;Other (comment)     Equipment Recommendations  Other (comment) (TBA)    Recommendations for Other Services       Precautions / Restrictions Precautions Precautions: Fall    Mobility  Bed Mobility Overal bed mobility: Modified Independent             General bed mobility comments: use of UE's appropriately from a flat bed, no rails, no assist.    Transfers Overall transfer level: Needs assistance Equipment used: None Transfers: Sit to/from Stand Sit to Stand: Min guard         General transfer comment: use of UE's appropriately, improved stability over eval, but still mildly unsteady.  Ambulation/Gait Ambulation/Gait assistance: Min assist;Min guard Gait Distance (Feet): 140 Feet (with 1 standing rest break) Assistive device: None;IV Pole Gait Pattern/deviations: Step-through pattern   Gait velocity interpretation: <1.31 ft/sec, indicative of household ambulator General Gait Details: short step length, antalgic on the right, worsening right LE pain on return to the room mild SOB.  On RA, sats remained appropriately in the 90's, HR in the upper 90's/low 100's.  Challenged balance with scanning, backing up with resultant mild deviation.   Stairs             Wheelchair Mobility    Modified Rankin (Stroke  Patients Only) Modified Rankin (Stroke Patients Only) Modified Rankin: Moderate disability     Balance Overall balance assessment: Needs assistance Sitting-balance support: No upper extremity supported;Feet unsupported         Standing balance-Leahy Scale: Fair                              Cognition Arousal/Alertness: Awake/alert Behavior During Therapy: WFL for tasks assessed/performed Overall Cognitive Status: Within Functional Limits for tasks assessed                                        Exercises      General Comments        Pertinent Vitals/Pain Pain Assessment: Faces    Home Living                      Prior Function            PT Goals (current goals can now be found in the care plan section) Acute Rehab PT Goals PT Goal Formulation: With patient Time For Goal Achievement: 09/11/20 Potential to Achieve Goals: Good Progress towards PT goals: Progressing toward goals    Frequency    Min 3X/week      PT Plan Current plan remains appropriate    Co-evaluation              AM-PAC PT "6 Clicks" Mobility   Outcome  Measure  Help needed turning from your back to your side while in a flat bed without using bedrails?: None Help needed moving from lying on your back to sitting on the side of a flat bed without using bedrails?: None Help needed moving to and from a bed to a chair (including a wheelchair)?: A Little Help needed standing up from a chair using your arms (e.g., wheelchair or bedside chair)?: A Little Help needed to walk in hospital room?: A Little Help needed climbing 3-5 steps with a railing? : A Lot 6 Click Score: 19    End of Session   Activity Tolerance: Patient tolerated treatment well;Patient limited by pain Patient left: in bed;Other (comment) (OT starting her session) Nurse Communication: Mobility status PT Visit Diagnosis: Unsteadiness on feet (R26.81);Other abnormalities of gait and  mobility (R26.89);Difficulty in walking, not elsewhere classified (R26.2);Other symptoms and signs involving the nervous system (S34.196)     Time: 2229-7989 PT Time Calculation (min) (ACUTE ONLY): 21 min  Charges:  $Gait Training: 8-22 mins                     08/29/2020  Ginger Carne., PT Acute Rehabilitation Services [phone]  (pager) [phone]  (office)   [name] 08/29/2020, 1:47 PM

## 2020-08-29 NOTE — Progress Notes (Signed)
   Note reviewed from Dr. Sloan Leiter this morning.  No new changes.  No new cardiac recommendations.  Please let us know if we can be of further assistance.  Candee Furbish, MD

## 2020-08-29 NOTE — Progress Notes (Signed)
Occupational Therapy Treatment Patient Details Name: Cody Guzman MRN: [id number] DOB: [birth year] Today's Date: 08/29/2020    History of present illness Pt is a 55 y/o male presenting 4[facility] ED from [facility] with acute onset of Chest discomfort and difficulty speaking.  MRI shows patchy scattered acute infartcts in the left frontal and right parietal lobes, likely embolic in nature.   PMHx: CAD, ICM, non-hodgkin's lymphoma in remmision, migraines, CKD2   OT comments  Pt requiring min guard assist for OOB mobility and ADL in standing due to reports of intermittent dizziness and R hip (gout) pain. Pt was reliant on AE or his wife for donning his R sock and washing his foot, will need assistance for this upon return to the medical [facility] at the jail. Verbally educated in energy conservation strategies and recommended seated showering. Pt with HR of 135-141 when talking about his history.   Follow Up Recommendations  Home health OT    Equipment Recommendations  Tub/shower seat    Recommendations for Other Services      Precautions / Restrictions Precautions Precautions: Fall       Mobility Bed Mobility Overal bed mobility: Modified Independent             General bed mobility comments: returned to supine    Transfers Overall transfer level: Needs assistance Equipment used: None Transfers: Sit to/from Stand Sit to Stand: Min guard         General transfer comment: mild unsteadiness, did not need physical assist    Balance Overall balance assessment: Needs assistance Sitting-balance support: No upper extremity supported;Feet unsupported Sitting balance-Leahy Scale: Good       Standing balance-Leahy Scale: Fair                             ADL either performed or assessed with clinical judgement   ADL Overall ADL's : Needs assistance/impaired     Grooming: Min guard;Oral care;Standing   Upper Body Bathing: Set up;Sitting   Lower Body  Bathing: Minimal assistance;Sitting/lateral leans   Upper Body Dressing : Sitting;Set up   Lower Body Dressing: Minimal assistance;Sit to/from stand Lower Body Dressing Details (indicate cue type and reason): pt was using a sock aid or his wife assisted him prior to him being sent to jail Toilet Transfer: Min guard;Ambulation   Toileting- Clothing Manipulation and Hygiene: Min guard       Functional mobility during ADLs: Min guard (pushes his IV pole) General ADL Comments: Pt reports intermittent dizziness when on his feet.     Vision       Perception     Praxis      Cognition Arousal/Alertness: Awake/alert Behavior During Therapy: WFL for tasks assessed/performed Overall Cognitive Status: Within Functional Limits for tasks assessed                                 General Comments: pt gets excited when talking at times with HR elevating to 141        Exercises     Shoulder Instructions       General Comments      Pertinent Vitals/ Pain       Pain Assessment: 0-10 Pain Score: 5  Pain Location: head Pain Descriptors / Indicators: Aching Pain Intervention(s): Monitored during session;Repositioned  Home Living  Prior Functioning/Environment              Frequency  Min 2X/week        Progress Toward Goals  OT Goals(current goals can now be found in the care plan section)  Progress towards OT goals: Progressing toward goals  Acute Rehab OT Goals Patient Stated Goal: get back to independent OT Goal Formulation: With patient Time For Goal Achievement: 09/11/20 Potential to Achieve Goals: Good  Plan Discharge plan remains appropriate    Co-evaluation                 AM-PAC OT "6 Clicks" Daily Activity     Outcome Measure   Help from another person eating meals?: None Help from another person taking care of personal grooming?: A Little Help from another person  toileting, which includes using toliet, bedpan, or urinal?: A Little Help from another person bathing (including washing, rinsing, drying)?: A Little Help from another person to put on and taking off regular upper body clothing?: None Help from another person to put on and taking off regular lower body clothing?: A Little 6 Click Score: 20    End of Session Equipment Utilized During Treatment: Gait belt  OT Visit Diagnosis: Unsteadiness on feet (R26.81);Other abnormalities of gait and mobility (R26.89);Muscle weakness (generalized) (M62.81);Other symptoms and signs involving cognitive function;Pain   Activity Tolerance Patient tolerated treatment well   Patient Left in bed;with call bell/phone within reach;with family/visitor present (officer present)   Nurse Communication          Time: 2229-7989 OT Time Calculation (min): 27 min  Charges: OT General Charges $OT Visit: 1 Visit OT Treatments $Self Care/Home Management : 23-37 mins  Nestor Lewandowsky, OTR/L Acute Rehabilitation Services Pager: [phone] Office: [phone]   Malka So 08/29/2020, 2:51 PM

## 2020-08-30 DIAGNOSIS — F319 Bipolar disorder, unspecified: Secondary | ICD-10-CM | POA: Diagnosis not present

## 2020-08-30 LAB — BASIC METABOLIC PANEL
Anion gap: 9 (ref 5–15)
BUN: 13 mg/dL (ref 6–20)
CO2: 26 mmol/L (ref 22–32)
Calcium: 9.3 mg/dL (ref 8.9–10.3)
Chloride: 104 mmol/L (ref 98–111)
Creatinine, Ser: 1.58 mg/dL — ABNORMAL HIGH (ref 0.61–1.24)
GFR, Estimated: 51 mL/min — ABNORMAL LOW (ref >60)
Glucose, Bld: 114 mg/dL — ABNORMAL HIGH (ref 70–99)
Potassium: 3.8 mmol/L (ref 3.5–5.1)
Sodium: 139 mmol/L (ref 135–145)

## 2020-08-30 LAB — PHOSPHORUS: Phosphorus: 3.5 mg/dL (ref 2.5–4.6)

## 2020-08-30 LAB — MAGNESIUM: Magnesium: 1.7 mg/dL (ref 1.7–2.4)

## 2020-08-30 MED ORDER — POTASSIUM CHLORIDE CRYS ER 20 MEQ PO TBCR
40.0000 meq | EXTENDED_RELEASE_TABLET | Freq: Two times a day (BID) | ORAL | Status: DC
  Administered 2020-08-30 – 2020-08-31 (×3): 40 meq via ORAL
  Filled 2020-08-30 (×3): qty 2

## 2020-08-30 MED ORDER — NITROGLYCERIN 0.4 MG SL SUBL: 0.4000 mg | SUBLINGUAL_TABLET | SUBLINGUAL | Status: DC | PRN

## 2020-08-30 MED ORDER — MAGNESIUM SULFATE 2 GM/50ML IV SOLN
2.0000 g | Freq: Once | INTRAVENOUS | Status: AC
  Administered 2020-08-30: 2 g via INTRAVENOUS
  Filled 2020-08-30: qty 50

## 2020-08-30 MED ORDER — QUETIAPINE FUMARATE 100 MG PO TABS
100.0000 mg | ORAL_TABLET | Freq: Every day | ORAL | Status: DC
  Administered 2020-08-30: 100 mg via ORAL
  Filled 2020-08-30: qty 1

## 2020-08-30 NOTE — Plan of Care (Signed)
  Problem: Education: Goal: Knowledge of General Education information will improve Description: Including pain rating scale, medication(s)/side effects and non-pharmacologic comfort measures Outcome: Progressing   Problem: Health Behavior/Discharge Planning: Goal: Ability to manage health-related needs will improve Outcome: Progressing   Problem: Activity: Goal: Risk for activity intolerance will decrease Outcome: Progressing   Problem: Nutrition: Goal: Adequate nutrition will be maintained Outcome: Progressing   Problem: Coping: Goal: Level of anxiety will decrease Outcome: Progressing   Problem: Elimination: Goal: Will not experience complications related to bowel motility Outcome: Not Applicable Goal: Will not experience complications related to urinary retention Outcome: Not Applicable   Problem: Pain Managment: Goal: General experience of comfort will improve Outcome: Not Applicable   Problem: Safety: Goal: Ability to remain free from injury will improve Outcome: Progressing   Problem: Education: Goal: Knowledge of disease or condition will improve Outcome: Progressing   Problem: Coping: Goal: Will verbalize positive feelings about self Outcome: Progressing
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
## 2020-08-31 ENCOUNTER — Ambulatory Visit: Payer: Self-pay | Admitting: *Deleted

## 2020-08-31 ENCOUNTER — Other Ambulatory Visit: Payer: Self-pay

## 2020-08-31 DIAGNOSIS — E876 Hypokalemia: Secondary | ICD-10-CM | POA: Diagnosis not present

## 2020-08-31 DIAGNOSIS — I484 Atypical atrial flutter: Secondary | ICD-10-CM | POA: Diagnosis not present

## 2020-08-31 MED ORDER — SPIRONOLACTONE 25 MG PO TABS: 12.5000 mg | ORAL_TABLET | Freq: Every day | ORAL | 2 refills | Status: DC

## 2020-08-31 MED ORDER — METOPROLOL SUCCINATE ER 25 MG PO TB24: 25.0000 mg | ORAL_TABLET | Freq: Every day | ORAL | 0 refills | Status: DC

## 2020-08-31 MED ORDER — AMIODARONE HCL 200 MG PO TABS: 200.0000 mg | ORAL_TABLET | Freq: Every day | ORAL | 11 refills | Status: DC

## 2020-08-31 MED ORDER — MAGNESIUM OXIDE 400 MG PO TABS
400.0000 mg | ORAL_TABLET | Freq: Every day | ORAL | 0 refills | Status: AC
  Filled 2020-08-31: qty 30, 30d supply, fill #0

## 2020-08-31 MED ORDER — GABAPENTIN 300 MG PO CAPS: 300.0000 mg | ORAL_CAPSULE | Freq: Three times a day (TID) | ORAL | 3 refills | Status: DC

## 2020-08-31 MED ORDER — TAMSULOSIN HCL 0.4 MG PO CAPS: 0.4000 mg | ORAL_CAPSULE | Freq: Every day | ORAL | 2 refills | Status: DC

## 2020-08-31 MED ORDER — TORSEMIDE 20 MG PO TABS: 20.0000 mg | ORAL_TABLET | Freq: Every day | ORAL | 0 refills | Status: DC

## 2020-08-31 MED ORDER — IRBESARTAN 75 MG PO TABS: 37.5000 mg | ORAL_TABLET | Freq: Every day | ORAL | 0 refills | Status: DC

## 2020-08-31 MED ORDER — ATORVASTATIN CALCIUM 80 MG PO TABS: 80.0000 mg | ORAL_TABLET | Freq: Every day | ORAL | 2 refills | Status: DC

## 2020-08-31 MED ORDER — ALBUTEROL SULFATE HFA 108 (90 BASE) MCG/ACT IN AERS: 2.0000 | INHALATION_SPRAY | Freq: Four times a day (QID) | RESPIRATORY_TRACT | 2 refills | Status: DC | PRN

## 2020-08-31 MED ORDER — POTASSIUM CHLORIDE CRYS ER 20 MEQ PO TBCR: 40.0000 meq | EXTENDED_RELEASE_TABLET | Freq: Every day | ORAL | 0 refills | Status: DC

## 2020-08-31 MED ORDER — QUETIAPINE FUMARATE 100 MG PO TABS: 100.0000 mg | ORAL_TABLET | Freq: Every day | ORAL | 0 refills | Status: DC

## 2020-08-31 MED ORDER — APIXABAN 5 MG PO TABS: 5.0000 mg | ORAL_TABLET | Freq: Two times a day (BID) | ORAL | 0 refills | Status: DC

## 2020-08-31 MED ORDER — SENNOSIDES-DOCUSATE SODIUM 8.6-50 MG PO TABS: 1.0000 | ORAL_TABLET | Freq: Two times a day (BID) | ORAL | 3 refills | Status: DC

## 2020-08-31 NOTE — TOC Transition Note (Signed)
Transition of Care [facility]) - CM/SW Discharge Note   Patient Details  Name: Cody Guzman MRN: [id number] Date of Birth: April 19, 1966  [facility]) CM/SW Contact:  Verdell Carmine, RN Phone Number: 08/31/2020, 1:12 PM   Clinical Narrative:    [facility] [phone] left confidential message to call me back regarding discharge orders.    Final next level of care: Other (comment) Barriers to Discharge: No Barriers Identified   Patient Goals and CMS Choice        Discharge Placement                       Discharge Plan and Services                                     Social Determinants of Health (SDOH) Interventions     Readmission Risk Interventions No flowsheet data found.

## 2020-08-31 NOTE — Telephone Encounter (Signed)
Called received from [place], from [place] [phone] to verify fax received from him for patient to receive right hip brace. Multiple attempts made without verification noted. Transferred call to [facility] at [facility] to f/u if fax received for Dr. [name] approval.

## 2020-08-31 NOTE — Discharge Summary (Signed)
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  MR Peak grad:    62.1 mmHg   TRICUSPID VALVE MR Mean grad:    40.0 mmHg   TR Peak grad:   35.5 mmHg MR Vmax:         394.00 cm/s TR Vmax:        298.00 cm/s MR Vmean:        297.0 cm/s MR PISA:         1.57 cm MR PISA Eff ROA: 16 mm MR PISA Radius:  0.50 cm Skeet Latch MD Electronically signed by Skeet Latch MD Signature Date/Time: 08/27/2020/5:28:48 PM    Final     (Echo, Carotid, EGD, Colonoscopy, ERCP)    Subjective: Patient seen and examined.  No overnight events.  Denies any dizziness today. He was very worried about sensory loss on his mouth and he has to extract some food from the side of the mouth in order to swallow. We explained in details about his difficulty swallowing/stroke and extreme precautions he has to take.  Patient is very receptive today.   Discharge Exam: Vitals:   08/30/20 2328 08/31/20 0335  BP: 108/87 96/76  Pulse: 93 94  Resp: 20 20  Temp: 97.6 F (36.4 C) 98 F (36.7 C)  SpO2: 97% 93%   Vitals:   08/30/20 1548 08/30/20 1939 08/30/20 2328 08/31/20 0335  BP: 101/73 (!) 130/95 108/87 96/76  Pulse:  95 93 94  Resp: (!) 25 17 20 20   Temp:  98.1 F (36.7 C)  97.6 F (36.4 C) 98 F (36.7 C)  TempSrc:  Oral Oral Oral  SpO2: 92% 96% 97% 93%  Weight:    101.5 kg  Height:        General: Pt is alert, awake, not in acute distress Not in any distress. Mild left facial droop. Speech is clear with some interruptions, comprehension is normal.  No focal neurological deficit including upper  and lower extremity motor and sensory loss. Cardiovascular: RRR, S1/S2 +, no rubs, no gallops Respiratory: CTA bilaterally, no wheezing, no rhonchi Abdominal: Soft, NT, ND, bowel sounds + Extremities: no edema, no cyanosis    The results of significant diagnostics from this hospitalization (including imaging, microbiology, ancillary and laboratory) are listed below for reference.     Microbiology: Recent Results (from the past 240 hour(s))  Resp Panel by RT-PCR (Flu A&B, Covid) Nasopharyngeal Swab     Status: None   Collection Time: 08/27/20  3:29 AM   Specimen: Nasopharyngeal Swab; Nasopharyngeal(NP) swabs in vial transport medium  Result Value Ref Range Status   SARS Coronavirus 2 by RT PCR NEGATIVE NEGATIVE Final    Comment: (NOTE) SARS-CoV-2 target nucleic acids are NOT DETECTED.  The SARS-CoV-2 RNA is generally detectable in upper respiratory specimens during the acute phase of infection. The lowest concentration of SARS-CoV-2 viral copies this assay can detect is 138 copies/mL. A negative result does not preclude SARS-Cov-2 infection and should not be used as the sole basis for treatment or other patient management decisions. A negative result may occur with  improper specimen collection/handling, submission of specimen other than nasopharyngeal swab, presence of viral mutation(s) within the areas targeted by this assay, and inadequate number of viral copies(<138 copies/mL). A negative result must be combined with clinical observations, patient history, and epidemiological information. The expected result is Negative.  Fact Sheet for Patients:   EntrepreneurPulse.com.au  Fact Sheet for Healthcare Providers:  IncredibleEmployment.be  This test is no t yet approved or cleared by the [place] FDA and  has been authorized for detection and/or diagnosis of SARS-CoV-2 by FDA under an Emergency Use Authorization (EUA). This EUA will remain  in effect (meaning this test can be used) for the duration of the COVID-19 declaration under Section 564(b)(1) of the Act, 21 U.S.C.section 360bbb-3(b)(1), unless the authorization is terminated  or revoked sooner.       Influenza A by PCR NEGATIVE NEGATIVE Final   Influenza B by PCR NEGATIVE NEGATIVE Final    Comment: (NOTE) The Xpert Xpress SARS-CoV-2/FLU/RSV plus assay is intended as an aid in the diagnosis of influenza from Nasopharyngeal swab specimens and should not be used as a sole basis for treatment. Nasal washings and aspirates are unacceptable for Xpert Xpress SARS-CoV-2/FLU/RSV testing.  Fact Sheet for Patients: EntrepreneurPulse.com.au  Fact Sheet for Healthcare Providers: IncredibleEmployment.be  This test is not yet approved or cleared by the [place] FDA and has been authorized for detection and/or diagnosis of SARS-CoV-2 by FDA under an Emergency Use Authorization (EUA). This EUA will remain in effect (meaning this test can be used) for the duration of the COVID-19 declaration under Section 564(b)(1) of the Act, 21 U.S.C. section 360bbb-3(b)(1), unless the authorization is terminated or revoked.  Performed at [facility], [place] [address] [place], [place] [address]   MRSA PCR Screening     Status: None   Collection Time: 08/27/20  5:09 AM   Specimen: Nasal Mucosa; Nasopharyngeal  Result Value Ref Range Status   MRSA by PCR NEGATIVE NEGATIVE Final    Comment:        The GeneXpert MRSA Assay (FDA approved for NASAL specimens only), is one component of a comprehensive MRSA  colonization surveillance program. It is not intended to diagnose MRSA infection nor to guide or monitor treatment for MRSA infections. Performed at [facility], [place] [address], [place], [place] [address]      Labs:  BNP (last 3 results) Recent Labs    06/16/20 1628 08/26/20 2329  BNP 361.5* [id number]   Basic Metabolic Panel: Recent Labs  Lab 08/26/20 2329 08/27/20 0922 08/27/20 1150 08/28/20 0038 08/29/20 0928 08/30/20 0047  NA 140 139  --  139 143 139  K 2.8* 3.4* 3.4* 3.3* 3.2* 3.8  CL 107 106  --  108 108 104  CO2 23 23  --  22 25 26   GLUCOSE 110* 126*  --  97 104* 114*  BUN 9 9  --  8 10 13   CREATININE 1.22 1.26*  --  1.26* 1.57* 1.58*  CALCIUM 9.2 9.4  --  9.2 9.3 9.3  MG  --  1.4*  --  1.6* 1.4* 1.7  PHOS  --   --   --   --   --  3.5   Liver Function Tests: Recent Labs  Lab 08/26/20 2329 08/27/20 0922 08/28/20 0038  AST 21 19 18   ALT 15 14 12   ALKPHOS 76 66 70  BILITOT 2.6* 2.9* 3.3*  PROT 7.4 7.0 6.2*  ALBUMIN 4.0 3.7 3.4*   No results for input(s): LIPASE, AMYLASE in the last 168 hours. No results for input(s): AMMONIA in the last 168 hours. CBC: Recent Labs  Lab 08/26/20 2329 08/27/20 0922 08/28/20 0038  WBC 7.3 5.9 6.2  NEUTROABS 3.7  --   --   HGB 13.9 13.4 12.1*  HCT 41.8 39.9 36.9*  MCV 90.5 89.1 90.2  PLT 253 213 228   Cardiac Enzymes: No results for input(s): CKTOTAL, CKMB, CKMBINDEX, TROPONINI in the last 168 hours. BNP: Invalid input(s): POCBNP CBG: No results for input(s): GLUCAP in the last 168 hours. D-Dimer No results for input(s): DDIMER in the last 72 hours. Hgb A1c No results for input(s): HGBA1C in the last 72 hours. Lipid Profile No results for input(s): CHOL, HDL, LDLCALC, TRIG, CHOLHDL, LDLDIRECT in the last 72 hours. Thyroid function studies No results for input(s): TSH, T4TOTAL, T3FREE, THYROIDAB in the last 72 hours.  Invalid input(s): FREET3 Anemia work up No results for input(s): VITAMINB12,  FOLATE, FERRITIN, TIBC, IRON, RETICCTPCT in the last 72 hours. Urinalysis    Component Value Date/Time   COLORURINE YELLOW 06/17/2020 1836   APPEARANCEUR CLEAR 06/17/2020 1836   LABSPEC 1.025 06/17/2020 1836   PHURINE 6.0 06/17/2020 1836   GLUCOSEU NEGATIVE 06/17/2020 1836   HGBUR NEGATIVE 06/17/2020 1836   BILIRUBINUR NEGATIVE 06/17/2020 1836   BILIRUBINUR negative 03/15/2018 [place] 06/17/2020 1836   PROTEINUR 100 (A) 06/17/2020 1836   UROBILINOGEN negative (A) 03/15/2018 1749   NITRITE NEGATIVE 06/17/2020 1836   LEUKOCYTESUR NEGATIVE 06/17/2020 1836   Sepsis Labs Invalid input(s): PROCALCITONIN,  WBC,  LACTICIDVEN Microbiology Recent Results (from the past 240 hour(s))  Resp Panel by RT-PCR (Flu A&B, Covid) Nasopharyngeal Swab     Status: None   Collection Time: 08/27/20  3:29 AM   Specimen: Nasopharyngeal Swab; Nasopharyngeal(NP) swabs in vial transport medium  Result Value Ref Range Status   SARS Coronavirus 2 by RT PCR NEGATIVE NEGATIVE Final    Comment: (NOTE) SARS-CoV-2 target nucleic acids are NOT DETECTED.  The SARS-CoV-2 RNA is generally detectable in upper respiratory specimens during the acute phase of infection. The lowest concentration of SARS-CoV-2 viral copies this assay can detect is 138 copies/mL. A negative result does not preclude SARS-Cov-2 infection and should not be used as the sole basis for treatment or other patient management decisions. A negative result may occur  with  improper specimen collection/handling, submission of specimen other than nasopharyngeal swab, presence of viral mutation(s) within the areas targeted by this assay, and inadequate number of viral copies(<138 copies/mL). A negative result must be combined with clinical observations, patient history, and epidemiological information. The expected result is Negative.  Fact Sheet for Patients:  EntrepreneurPulse.com.au  Fact Sheet for Healthcare  Providers:  IncredibleEmployment.be  This test is no t yet approved or cleared by the [place] FDA and  has been authorized for detection and/or diagnosis of SARS-CoV-2 by FDA under an Emergency Use Authorization (EUA). This EUA will remain  in effect (meaning this test can be used) for the duration of the COVID-19 declaration under Section 564(b)(1) of the Act, 21 U.S.C.section 360bbb-3(b)(1), unless the authorization is terminated  or revoked sooner.       Influenza A by PCR NEGATIVE NEGATIVE Final   Influenza B by PCR NEGATIVE NEGATIVE Final    Comment: (NOTE) The Xpert Xpress SARS-CoV-2/FLU/RSV plus assay is intended as an aid in the diagnosis of influenza from Nasopharyngeal swab specimens and should not be used as a sole basis for treatment. Nasal washings and aspirates are unacceptable for Xpert Xpress SARS-CoV-2/FLU/RSV testing.  Fact Sheet for Patients: EntrepreneurPulse.com.au  Fact Sheet for Healthcare Providers: IncredibleEmployment.be  This test is not yet approved or cleared by the [place] FDA and has been authorized for detection and/or diagnosis of SARS-CoV-2 by FDA under an Emergency Use Authorization (EUA). This EUA will remain in effect (meaning this test can be used) for the duration of the COVID-19 declaration under Section 564(b)(1) of the Act, 21 U.S.C. section 360bbb-3(b)(1), unless the authorization is terminated or revoked.  Performed at [facility], [place] [address] [place], [place] [address]   MRSA PCR Screening     Status: None   Collection Time: 08/27/20  5:09 AM   Specimen: Nasal Mucosa; Nasopharyngeal  Result Value Ref Range Status   MRSA by PCR NEGATIVE NEGATIVE Final    Comment:        The GeneXpert MRSA Assay (FDA approved for NASAL specimens only), is one component of a comprehensive MRSA colonization surveillance program. It is not intended to diagnose  MRSA infection nor to guide or monitor treatment for MRSA infections. Performed at [facility], [place] [address], [name],  [address]      Time coordinating discharge:  40 minutes  SIGNED:   Barb Merino, MD  [facility] 08/31/2020, 9:19 AM

## 2020-08-31 NOTE — Progress Notes (Signed)
  Speech Language Pathology Treatment: Cognitive-Linquistic  Patient Details Name: Cody Guzman MRN: [id number] DOB: [birth year] Today's Date: 08/31/2020 Time: 2158-7276 SLP Time Calculation (min) (ACUTE ONLY): 19 min  Assessment / Plan / Recommendation Clinical Impression  Pt's dysarthria has improved during this admission, now characterized as mild.  When cued to slow rate, pause between each word, and over-articulate, intelligibility improves to 90%.  Continued to require intermittent verbal reminders throughout session. We discussed the benefit of explaining that a stroke has impacted his speech before beginning conversations with strangers - this often allows the listener to have more patience and understanding.  Pt agrees he needs f/u SLP - either OP or HH depending on ultimate disposition after his incarceration.     HPI HPI: 55 year old male with history of coronary artery disease, ischemic cardiomyopathy with known ejection fraction 20 to 25%, non-Hodgkin's lymphoma in remission, migraines, chronic kidney disease stage II, anxiety and chronic pain presented [facility] the emergency room [facility] with acute onset of chest discomfort and difficulty speaking. Dx acute multifocal CVAs left frontal and right parietal lobes. Chronic infarcts and encephalomalacia in multiple lobes of the brain.      SLP Plan  All goals met       Recommendations                   Oral Care Recommendations: Oral care BID Follow up Recommendations: Outpatient SLP SLP Visit Diagnosis: Dysarthria and anarthria (R47.1) Plan: All goals met       GO              Thalia Turkington L. [name], [place] Office number [phone] Pager [phone]   [name] 08/31/2020, 1:32 PM

## 2020-08-31 NOTE — Telephone Encounter (Signed)
[facility] rep spoke with Zierra. Paperwork was received while she was on the phone with him, I labeled and placed in PCP box.

## 2020-08-31 NOTE — Telephone Encounter (Signed)
Route to nurse °

## 2020-08-31 NOTE — Discharge Instructions (Signed)

## 2020-08-31 NOTE — Progress Notes (Signed)
[facility], all paperwork's handed to guard escort in sealed envelope to be provided to physician at receiving facility.

## 2020-08-31 NOTE — Progress Notes (Signed)
Physical Therapy Treatment Patient Details Name: Cody Guzman MRN: [id number] DOB: 1965/06/12 Today's Date: 08/31/2020    History of Present Illness pt is a 55 y/o male presenting 4[facility]the ED [facility] with acute onset of Chest discomfort and difficulty speaking.  MRI shows patchy scattered acute infartcts in the left frontal and right parietal lobes, likely embolic in nature.   PMHx: CAD, ICM, non-hodgkin's lymphoma in remmision, migraines, CKD2    PT Comments    Pt doing well with mobility and no further PT needed.  Ready for dc from PT standpoint.     Follow Up Recommendations  No PT follow up     Equipment Recommendations  None recommended by PT (.)    Recommendations for Other Services       Precautions / Restrictions Precautions Precautions: None    Mobility  Bed Mobility Overal bed mobility: Independent                  Transfers Overall transfer level: Modified independent Equipment used: None Transfers: Sit to/from Stand Sit to Stand: Modified independent (Device/Increase time)            Ambulation/Gait Ambulation/Gait assistance: Modified independent (Device/Increase time) Gait Distance (Feet): 200 Feet Assistive device: None Gait Pattern/deviations: Step-through pattern;Decreased stride length Gait velocity: decr Gait velocity interpretation: 1.31 - 2.62 ft/sec, indicative of limited community ambulator General Gait Details: Decr stride length due to leg chains. Steady gait.   Stairs             Wheelchair Mobility    Modified Rankin (Stroke Patients Only) Modified Rankin (Stroke Patients Only) Pre-Morbid Rankin Score: Slight disability Modified Rankin: Moderately severe disability     Balance Overall balance assessment: Needs assistance Sitting-balance support: No upper extremity supported;Feet unsupported Sitting balance-Leahy Scale: Normal     Standing balance support: No upper extremity supported;During  functional activity Standing balance-Leahy Scale: Normal                              Cognition Arousal/Alertness: Awake/alert Behavior During Therapy: WFL for tasks assessed/performed Overall Cognitive Status: Within Functional Limits for tasks assessed                                        Exercises      General Comments General comments (skin integrity, edema, etc.): Pt with leg chains on      Pertinent Vitals/Pain Pain Assessment: No/denies pain    Home Living                      Prior Function            PT Goals (current goals can now be found in the care plan section) Acute Rehab PT Goals Patient Stated Goal: get back to independent Progress towards PT goals: Goals met/education completed, patient discharged from PT    Frequency           PT Plan Discharge plan needs to be updated    Co-evaluation              AM-PAC PT "6 Clicks" Mobility   Outcome Measure  Help needed turning from your back to your side while in a flat bed without using bedrails?: None Help needed moving from lying on your back to sitting on the side of a  flat bed without using bedrails?: None Help needed moving to and from a bed to a chair (including a wheelchair)?: None Help needed standing up from a chair using your arms (e.g., wheelchair or bedside chair)?: None Help needed to walk in hospital room?: None Help needed climbing 3-5 steps with a railing? : None 6 Click Score: 24    End of Session   Activity Tolerance: Patient tolerated treatment well Patient left: in bed;with call bell/phone within reach;Other (comment) (Corporate treasurer present) Nurse Communication: Mobility status PT Visit Diagnosis: Other symptoms and signs involving the nervous system (R29.898)     Time: 1207-1221 PT Time Calculation (min) (ACUTE ONLY): 14 min  Charges:  $Gait Training: 8-22 mins                     [place] Pager [phone] Office [place] 08/31/2020, 1:59 PM

## 2020-09-03 ENCOUNTER — Telehealth (HOSPITAL_COMMUNITY): Payer: Self-pay | Admitting: Critical Care Medicine

## 2020-09-03 ENCOUNTER — Ambulatory Visit (HOSPITAL_COMMUNITY)

## 2020-09-03 ENCOUNTER — Encounter: Payer: Self-pay | Admitting: Cardiology

## 2020-09-03 NOTE — Progress Notes (Unsigned)
Patient ID: Cody Guzman, male   DOB: 30-Jan-1966, 55 y.o.   MRN: [id number]  Verified appointment "no show" status with Elmo Putt at 3:66QH.

## 2020-09-03 NOTE — Telephone Encounter (Signed)
Just an FYI. We have made several attempts to contact this patient including sending a letter to schedule or reschedule their echocardiogram. We will be removing the patient from the echo [facility].  09/03/20 NO SHOWED x2 -no flup made/LBW  08/01/20 NO SHOWED-MAILED LETTER [facility]W      Thank you

## 2020-09-03 NOTE — Telephone Encounter (Signed)
Paperwork has been refaxed 

## 2020-09-07 ENCOUNTER — Other Ambulatory Visit: Payer: Self-pay | Admitting: Critical Care Medicine

## 2020-09-07 NOTE — Telephone Encounter (Signed)
Requested medication (s) are due for refill today: no  Requested medication (s) are on the active medication list:yes  Last refill: 08/16/2020  Future visit scheduled: no  Notes to clinic:  One inhaler should last at least one month. If the patient is requesting refills earlier, contact the patient to check for uncontrolled   Requested Prescriptions  Pending Prescriptions Disp Refills   albuterol (PROVENTIL) (2.5 MG/3ML) 0.083% nebulizer solution [Pharmacy Med Name: ALBUTEROL 0.083% 25CT (2.5 MG/3ML Nebulization Solution] 150 mL 10    Sig: INHALE CONTENTS OF 1 VIAL VIA NEBULIZER EVERY 6 HOURS AS NEEDED FOR WHEEZING OR FOR SHORTNESS OF BREATH      Pulmonology:  Beta Agonists Failed - 09/07/2020 10:02 AM      Failed - One inhaler should last at least one month. If the patient is requesting refills earlier, contact the patient to check for uncontrolled symptoms.      Passed - Valid encounter within last 12 months    Recent Outpatient Visits           1 month ago Ischemic cardiomyopathy   [place] [name], MD   2 months ago Right hip pain   [place] [name], MD   5 months ago Essential hypertension   [place] [name], MD   5 months ago Coronary artery disease involving native coronary artery of native heart with angina pectoris [facility])   [place] [name], MD   5 months ago Coronary artery disease involving native coronary artery of native heart with angina pectoris [facility])   [facility] [name], MD

## 2020-09-13 ENCOUNTER — Encounter (HOSPITAL_BASED_OUTPATIENT_CLINIC_OR_DEPARTMENT_OTHER): Payer: 59 | Admitting: Internal Medicine

## 2020-09-14 ENCOUNTER — Telehealth: Payer: Self-pay | Admitting: Critical Care Medicine

## 2020-09-14 NOTE — Telephone Encounter (Signed)
Copied from [place] [id number]. Topic: General - Other >> Sep 12, 2020  3:41 PM [place] wrote: Cody Schanz "[name]" (Patient) Cody Guzman, Cody "[name]" (Patient) General - Other Reason for CRM: Linton Rump, from [employer], calling regarding a fax that was sent over on 08/15/20. He states that this was in regards to having paperwork filled out, chart notes from last 2-3 appts. He states that he has not received a response. He states that he has received an approval for the prescription on 08/14/20, but there is another form that was sent over as well. Please Advise. Copied CRM update: pt has received all info but this is the 3rd request as the chart notes for the past 2 to 3 appt  Notes are missing. Deadline is on Thursday! Fax to [phone]  or 1 [phone]  pls Ashley Akin on the forms/

## 2020-09-14 NOTE — Telephone Encounter (Signed)
Office notes will be faxed over.

## 2020-10-03 ENCOUNTER — Telehealth: Payer: Self-pay | Admitting: Critical Care Medicine

## 2020-10-03 NOTE — Telephone Encounter (Signed)
Copied from [place] [phone]. Topic: General - Other >> Oct 02, 2020  1:16 PM Cody Guzman wrote: Reason for CRM: Patient called in to inform Cody Guzman that he came out the hospital on 09/22/20 after having Guzman stroke. First available appointment is 11/20/20 Please call patient at  Ph# [phone]  Can patient be worked in with PCP or should I refer patient to mobile? Please advise and I can contact patient to schedule.

## 2020-10-03 NOTE — Telephone Encounter (Signed)
Will forward to provider to see if we can work him in sooner

## 2020-10-03 NOTE — Telephone Encounter (Signed)
Will have to go to mobile

## 2020-10-04 ENCOUNTER — Other Ambulatory Visit: Payer: Self-pay | Admitting: Critical Care Medicine

## 2020-10-04 DIAGNOSIS — M5442 Lumbago with sciatica, left side: Secondary | ICD-10-CM

## 2020-10-04 DIAGNOSIS — J4520 Mild intermittent asthma, uncomplicated: Secondary | ICD-10-CM

## 2020-10-04 DIAGNOSIS — G8929 Other chronic pain: Secondary | ICD-10-CM

## 2020-10-04 NOTE — Telephone Encounter (Signed)
Tried to reach pt and emergency contact to schedule pt a hospital f/u with Tonya. Was unable to reach pt or emergency contact due to call keep ringing and going to busy signal

## 2020-10-04 NOTE — Telephone Encounter (Signed)
Requested medication (s) are due for refill today - yes  Requested medication (s) are on the active medication list -yes  Future visit scheduled -no  Last refill: 09/14/20  Notes to clinic: Request RF- outside provider  Requested Prescriptions  Pending Prescriptions Disp Refills   albuterol (VENTOLIN HFA) 108 (90 Base) MCG/ACT inhaler [Pharmacy Med Name: ALBUTEROL HFA *PROV* 90MCG 108 (90 BAS Aerosol] 6.7 g 10    Sig: INHALE 2 PUFFS INTO THE LUNGS EVERY 6 HOURS AS NEEDED FOR WHEEZING OR SHORTNESS OF BREATH      Pulmonology:  Beta Agonists Failed - 10/04/2020  3:22 PM      Failed - One inhaler should last at least one month. If the patient is requesting refills earlier, contact the patient to check for uncontrolled symptoms.      Passed - Valid encounter within last 12 months    Recent Outpatient Visits           2 months ago Ischemic cardiomyopathy   [place] [name], MD   3 months ago Right hip pain   [place] [name], MD   6 months ago Essential hypertension   [place] [name], MD   6 months ago Coronary artery disease involving native coronary artery of native heart with angina pectoris [facility])   [place] [name], MD   6 months ago Coronary artery disease involving native coronary artery of native heart with angina pectoris [facility])   [place] [name], MD                    Requested Prescriptions  Pending Prescriptions Disp Refills   albuterol (VENTOLIN HFA) 108 (90 Base) MCG/ACT inhaler [Pharmacy Med Name: ALBUTEROL HFA *PROV* 90MCG 108 (90 BAS Aerosol] 6.7 g 10    Sig: INHALE 2 PUFFS INTO THE LUNGS EVERY 6 HOURS AS NEEDED FOR WHEEZING OR SHORTNESS OF BREATH      Pulmonology:  Beta Agonists Failed - 10/04/2020  3:22 PM      Failed - One inhaler should last at  least one month. If the patient is requesting refills earlier, contact the patient to check for uncontrolled symptoms.      Passed - Valid encounter within last 12 months    Recent Outpatient Visits           2 months ago Ischemic cardiomyopathy   [place] [name], MD   3 months ago Right hip pain   [place] [name], MD   6 months ago Essential hypertension   [place] [name], MD   6 months ago Coronary artery disease involving native coronary artery of native heart with angina pectoris [facility])   [place] [name], MD   6 months ago Coronary artery disease involving native coronary artery of native heart with angina pectoris [facility])   [facility] [name], MD

## 2020-11-02 ENCOUNTER — Other Ambulatory Visit: Payer: Self-pay | Admitting: Critical Care Medicine

## 2020-11-02 DIAGNOSIS — J4520 Mild intermittent asthma, uncomplicated: Secondary | ICD-10-CM

## 2020-11-02 DIAGNOSIS — M5442 Lumbago with sciatica, left side: Secondary | ICD-10-CM

## 2020-11-02 NOTE — Telephone Encounter (Signed)
Requested medication (s) are due for refill today:no  Requested medication (s) are on the active medication list: yes  Last refill: 10/16/2020  Future visit scheduled: no  Notes to clinic:  Failed protocol:One inhaler should last at least one month. If the patient is requesting refills earlier, contact the patient to check for uncontrolled symptoms   Requested Prescriptions  Pending Prescriptions Disp Refills   albuterol (VENTOLIN HFA) 108 (90 Base) MCG/ACT inhaler [Pharmacy Med Name: ALBUTEROL HFA *PROV* 90MCG 108 (90 BAS Aerosol] 6.7 g 10    Sig: INHALE 2 PUFFS INTO THE LUNGS EVERY 6 HOURS AS NEEDED FOR WHEEZING OR SHORTNESS OF BREATH *MUST HAVE OFFICE VISIT FOR REFILLS      Pulmonology:  Beta Agonists Failed - 11/02/2020  9:03 AM      Failed - One inhaler should last at least one month. If the patient is requesting refills earlier, contact the patient to check for uncontrolled symptoms.      Passed - Valid encounter within last 12 months    Recent Outpatient Visits           3 months ago Ischemic cardiomyopathy   [place] [name], MD   4 months ago Right hip pain   [place] [name], MD   7 months ago Essential hypertension   [place] [name], MD   7 months ago Coronary artery disease involving native coronary artery of native heart with angina pectoris [facility])   [place] [name], MD   7 months ago Coronary artery disease involving native coronary artery of native heart with angina pectoris [facility])   [facility] [name], MD

## 2020-11-16 ENCOUNTER — Other Ambulatory Visit: Payer: Self-pay

## 2020-12-07 ENCOUNTER — Other Ambulatory Visit: Payer: Self-pay | Admitting: Critical Care Medicine

## 2020-12-07 DIAGNOSIS — M5442 Lumbago with sciatica, left side: Secondary | ICD-10-CM

## 2020-12-07 DIAGNOSIS — G8929 Other chronic pain: Secondary | ICD-10-CM

## 2020-12-07 DIAGNOSIS — J4520 Mild intermittent asthma, uncomplicated: Secondary | ICD-10-CM

## 2020-12-07 NOTE — Telephone Encounter (Signed)
Requested medication (s) are due for refill today   No. This is ordered as PRN. Last ordered on 11/02/20  Requested medication (s) are on the active medication list Yes as PRN  Future visit scheduled Yes for 12/31/20.  LOV 07/11/20  Note to clinic-Chart specifies must have OV for refills. Routing to office for consideration.   Requested Prescriptions  Pending Prescriptions Disp Refills   albuterol (VENTOLIN HFA) 108 (90 Base) MCG/ACT inhaler [Pharmacy Med Name: ALBUTEROL HFA *PROV* 90MCG 108 (90 BAS Aerosol] 6.7 g 10    Sig: INHALE 2 PUFFS INTO THE LUNGS EVERY 6 HOURS AS NEEDED FOR WHEEZING OR SHORTNESS OF BREATH *MUST HAVE OFFICE VISIT FOR REFILLS      Pulmonology:  Beta Agonists Failed - 12/07/2020 12:54 PM      Failed - One inhaler should last at least one month. If the patient is requesting refills earlier, contact the patient to check for uncontrolled symptoms.      Passed - Valid encounter within last 12 months    Recent Outpatient Visits           4 months ago Ischemic cardiomyopathy   [place] [name], MD   5 months ago Right hip pain   [place] [name], MD   8 months ago Essential hypertension   [place] [name], MD   8 months ago Coronary artery disease involving native coronary artery of native heart with angina pectoris [facility])   [place] [name], MD   8 months ago Coronary artery disease involving native coronary artery of native heart with angina pectoris [facility])   [place] [name], MD       Future Appointments             In 3 weeks [name], MD [place]

## 2020-12-17 ENCOUNTER — Other Ambulatory Visit: Payer: Self-pay | Admitting: Critical Care Medicine

## 2020-12-17 DIAGNOSIS — M5442 Lumbago with sciatica, left side: Secondary | ICD-10-CM

## 2020-12-17 DIAGNOSIS — G8929 Other chronic pain: Secondary | ICD-10-CM

## 2020-12-17 DIAGNOSIS — J4520 Mild intermittent asthma, uncomplicated: Secondary | ICD-10-CM

## 2020-12-18 NOTE — Telephone Encounter (Signed)
  Notes to clinic:  script just filled on 12/14/2020 Review for future refill Patient has appt on 12/31/2020   Requested Prescriptions  Pending Prescriptions Disp Refills   albuterol (VENTOLIN HFA) 108 (90 Base) MCG/ACT inhaler [Pharmacy Med Name: ALBUTEROL HFA *PROV* 90MCG 108 (90 BAS Aerosol] 6.7 g 10    Sig: INHALE TWO(2) PUFFS INTO LUNGS EVERY SIX(6) HOURS AS NEEDED FOR SHORTNESS OF BREATH/WHEEZING *PATIENT NEEDS APPOINTMENT*     Pulmonology:  Beta Agonists Failed - 12/17/2020  6:36 PM      Failed - One inhaler should last at least one month. If the patient is requesting refills earlier, contact the patient to check for uncontrolled symptoms.      Passed - Valid encounter within last 12 months    Recent Outpatient Visits           5 months ago Ischemic cardiomyopathy   [place] [name], MD   6 months ago Right hip pain   [place] [name], MD   9 months ago Essential hypertension   [place] [name], MD   9 months ago Coronary artery disease involving native coronary artery of native heart with angina pectoris [facility])   [place] [name], MD   9 months ago Coronary artery disease involving native coronary artery of native heart with angina pectoris [facility])   [place] [name], MD       Future Appointments             In 1 week [name], MD [place]

## 2020-12-29 DIAGNOSIS — H524 Presbyopia: Secondary | ICD-10-CM | POA: Diagnosis not present

## 2020-12-31 ENCOUNTER — Ambulatory Visit: Payer: 59 | Admitting: Critical Care Medicine

## 2021-01-10 ENCOUNTER — Ambulatory Visit: Payer: Medicare Other | Attending: Critical Care Medicine | Admitting: Critical Care Medicine

## 2021-01-10 ENCOUNTER — Encounter: Payer: Self-pay | Admitting: Critical Care Medicine

## 2021-01-10 ENCOUNTER — Telehealth: Payer: Self-pay | Admitting: Critical Care Medicine

## 2021-01-10 ENCOUNTER — Other Ambulatory Visit: Payer: Self-pay

## 2021-01-10 VITALS — BP 112/81 | HR 82 | Resp 16 | Wt 237.6 lb

## 2021-01-10 DIAGNOSIS — F319 Bipolar disorder, unspecified: Secondary | ICD-10-CM | POA: Insufficient documentation

## 2021-01-10 DIAGNOSIS — M545 Low back pain, unspecified: Secondary | ICD-10-CM | POA: Diagnosis not present

## 2021-01-10 DIAGNOSIS — C8590 Non-Hodgkin lymphoma, unspecified, unspecified site: Secondary | ICD-10-CM | POA: Insufficient documentation

## 2021-01-10 DIAGNOSIS — I5082 Biventricular heart failure: Secondary | ICD-10-CM | POA: Diagnosis not present

## 2021-01-10 DIAGNOSIS — N179 Acute kidney failure, unspecified: Secondary | ICD-10-CM | POA: Diagnosis not present

## 2021-01-10 DIAGNOSIS — G8929 Other chronic pain: Secondary | ICD-10-CM | POA: Insufficient documentation

## 2021-01-10 DIAGNOSIS — I6310 Cerebral infarction due to embolism of unspecified precerebral artery: Secondary | ICD-10-CM

## 2021-01-10 DIAGNOSIS — R300 Dysuria: Secondary | ICD-10-CM | POA: Insufficient documentation

## 2021-01-10 DIAGNOSIS — I272 Pulmonary hypertension, unspecified: Secondary | ICD-10-CM | POA: Diagnosis not present

## 2021-01-10 DIAGNOSIS — R531 Weakness: Secondary | ICD-10-CM | POA: Insufficient documentation

## 2021-01-10 DIAGNOSIS — R4701 Aphasia: Secondary | ICD-10-CM | POA: Diagnosis not present

## 2021-01-10 DIAGNOSIS — R200 Anesthesia of skin: Secondary | ICD-10-CM

## 2021-01-10 DIAGNOSIS — N182 Chronic kidney disease, stage 2 (mild): Secondary | ICD-10-CM | POA: Diagnosis not present

## 2021-01-10 DIAGNOSIS — I472 Ventricular tachycardia, unspecified: Secondary | ICD-10-CM

## 2021-01-10 DIAGNOSIS — I25119 Atherosclerotic heart disease of native coronary artery with unspecified angina pectoris: Secondary | ICD-10-CM | POA: Diagnosis not present

## 2021-01-10 DIAGNOSIS — I6389 Other cerebral infarction: Secondary | ICD-10-CM | POA: Diagnosis not present

## 2021-01-10 DIAGNOSIS — I5042 Chronic combined systolic (congestive) and diastolic (congestive) heart failure: Secondary | ICD-10-CM | POA: Insufficient documentation

## 2021-01-10 DIAGNOSIS — I130 Hypertensive heart and chronic kidney disease with heart failure and stage 1 through stage 4 chronic kidney disease, or unspecified chronic kidney disease: Secondary | ICD-10-CM | POA: Insufficient documentation

## 2021-01-10 DIAGNOSIS — M792 Neuralgia and neuritis, unspecified: Secondary | ICD-10-CM | POA: Diagnosis not present

## 2021-01-10 DIAGNOSIS — E785 Hyperlipidemia, unspecified: Secondary | ICD-10-CM | POA: Diagnosis not present

## 2021-01-10 DIAGNOSIS — I4892 Unspecified atrial flutter: Secondary | ICD-10-CM | POA: Insufficient documentation

## 2021-01-10 DIAGNOSIS — J4520 Mild intermittent asthma, uncomplicated: Secondary | ICD-10-CM | POA: Diagnosis not present

## 2021-01-10 DIAGNOSIS — I255 Ischemic cardiomyopathy: Secondary | ICD-10-CM | POA: Insufficient documentation

## 2021-01-10 DIAGNOSIS — F17210 Nicotine dependence, cigarettes, uncomplicated: Secondary | ICD-10-CM | POA: Diagnosis not present

## 2021-01-10 DIAGNOSIS — G4730 Sleep apnea, unspecified: Secondary | ICD-10-CM | POA: Diagnosis not present

## 2021-01-10 DIAGNOSIS — Z79899 Other long term (current) drug therapy: Secondary | ICD-10-CM | POA: Diagnosis not present

## 2021-01-10 DIAGNOSIS — I509 Heart failure, unspecified: Secondary | ICD-10-CM

## 2021-01-10 DIAGNOSIS — I10 Essential (primary) hypertension: Secondary | ICD-10-CM | POA: Diagnosis not present

## 2021-01-10 DIAGNOSIS — M109 Gout, unspecified: Secondary | ICD-10-CM | POA: Diagnosis not present

## 2021-01-10 DIAGNOSIS — Z7901 Long term (current) use of anticoagulants: Secondary | ICD-10-CM | POA: Insufficient documentation

## 2021-01-10 DIAGNOSIS — F4310 Post-traumatic stress disorder, unspecified: Secondary | ICD-10-CM | POA: Insufficient documentation

## 2021-01-10 DIAGNOSIS — E876 Hypokalemia: Secondary | ICD-10-CM | POA: Insufficient documentation

## 2021-01-10 MED ORDER — AMIODARONE HCL 200 MG PO TABS: 200.0000 mg | ORAL_TABLET | Freq: Every day | ORAL | 11 refills | Status: DC

## 2021-01-10 MED ORDER — COLCHICINE 0.6 MG PO TABS
0.6000 mg | ORAL_TABLET | Freq: Every day | ORAL | 1 refills | Status: DC | PRN
  Filled 2021-01-10: qty 30, 30d supply, fill #0

## 2021-01-10 MED ORDER — IRBESARTAN 75 MG PO TABS
37.5000 mg | ORAL_TABLET | Freq: Every day | ORAL | 4 refills | Status: DC
  Filled 2021-01-10: qty 30, 60d supply, fill #0

## 2021-01-10 MED ORDER — BIDIL 20-37.5 MG PO TABS
1.0000 | ORAL_TABLET | Freq: Three times a day (TID) | ORAL | 4 refills | Status: DC
  Filled 2021-01-10: qty 90, 30d supply, fill #0

## 2021-01-10 MED ORDER — NITROGLYCERIN 0.4 MG SL SUBL
0.4000 mg | SUBLINGUAL_TABLET | SUBLINGUAL | 3 refills | Status: DC | PRN
  Filled 2021-01-10: qty 25, 30d supply, fill #0

## 2021-01-10 MED ORDER — SPIRONOLACTONE 25 MG PO TABS
12.5000 mg | ORAL_TABLET | Freq: Every day | ORAL | 3 refills | Status: DC
  Filled 2021-01-10: qty 45, 90d supply, fill #0

## 2021-01-10 MED ORDER — GABAPENTIN 300 MG PO CAPS: 600.0000 mg | ORAL_CAPSULE | Freq: Three times a day (TID) | ORAL | 3 refills | Status: DC

## 2021-01-10 MED ORDER — SENNOSIDES-DOCUSATE SODIUM 8.6-50 MG PO TABS
1.0000 | ORAL_TABLET | Freq: Two times a day (BID) | ORAL | 3 refills | Status: DC
  Filled 2021-01-10: qty 180, 90d supply, fill #0

## 2021-01-10 MED ORDER — ATORVASTATIN CALCIUM 80 MG PO TABS: 80.0000 mg | ORAL_TABLET | Freq: Every day | ORAL | 2 refills | Status: DC

## 2021-01-10 MED ORDER — ALLOPURINOL 100 MG PO TABS
100.0000 mg | ORAL_TABLET | Freq: Every day | ORAL | 3 refills | Status: DC
  Filled 2021-01-10: qty 90, 90d supply, fill #0

## 2021-01-10 MED ORDER — QUETIAPINE FUMARATE 100 MG PO TABS
100.0000 mg | ORAL_TABLET | Freq: Every day | ORAL | 2 refills | Status: DC
  Filled 2021-01-10: qty 90, 90d supply, fill #0

## 2021-01-10 MED ORDER — CARVEDILOL 6.25 MG PO TABS
6.2500 mg | ORAL_TABLET | Freq: Two times a day (BID) | ORAL | 2 refills | Status: DC
  Filled 2021-01-10: qty 180, 90d supply, fill #0

## 2021-01-10 MED ORDER — APIXABAN 5 MG PO TABS
5.0000 mg | ORAL_TABLET | Freq: Two times a day (BID) | ORAL | 4 refills | Status: DC
  Filled 2021-01-10 – 2021-02-20 (×2): qty 60, 30d supply, fill #0

## 2021-01-10 MED ORDER — POTASSIUM CHLORIDE CRYS ER 20 MEQ PO TBCR
40.0000 meq | EXTENDED_RELEASE_TABLET | Freq: Every day | ORAL | 3 refills | Status: DC
  Filled 2021-01-10: qty 180, 90d supply, fill #0

## 2021-01-10 MED ORDER — TORSEMIDE 20 MG PO TABS
20.0000 mg | ORAL_TABLET | Freq: Every day | ORAL | 1 refills | Status: DC
  Filled 2021-01-10: qty 90, 90d supply, fill #0

## 2021-01-10 MED ORDER — METOPROLOL SUCCINATE ER 25 MG PO TB24
25.0000 mg | ORAL_TABLET | Freq: Every day | ORAL | 3 refills | Status: DC
  Filled 2021-01-10: qty 90, 90d supply, fill #0

## 2021-01-10 MED ORDER — TAMSULOSIN HCL 0.4 MG PO CAPS
0.4000 mg | ORAL_CAPSULE | Freq: Every day | ORAL | 2 refills | Status: DC
  Filled 2021-01-10: qty 90, 90d supply, fill #0

## 2021-01-10 NOTE — Assessment & Plan Note (Signed)
Follow-up renal function 

## 2021-01-10 NOTE — Assessment & Plan Note (Signed)
History of ventricular tachycardia likely will need an AICD referral to the heart failure clinic made

## 2021-01-10 NOTE — Assessment & Plan Note (Signed)
Referral to psychiatry

## 2021-01-10 NOTE — Telephone Encounter (Signed)
Patient needs to be back in with the psychiatry clinic I was not sure where to send him he [place] and [place] [employer] can you assist also can you follow-up with him by phone as well has severe bipolar disorder

## 2021-01-10 NOTE — Assessment & Plan Note (Signed)
Resolved with therapy 

## 2021-01-10 NOTE — Assessment & Plan Note (Signed)
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

## 2021-01-10 NOTE — Assessment & Plan Note (Signed)
Increase gabapentin refer to orthopedics

## 2021-01-10 NOTE — Assessment & Plan Note (Signed)
Hypertension well controlled refilled current medications

## 2021-01-10 NOTE — Assessment & Plan Note (Signed)
Renew gout medicine

## 2021-01-10 NOTE — Progress Notes (Signed)
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     torsemide (DEMADEX) 20 MG tablet; Take 1 tablet (20 mg total) by mouth daily. -     allopurinol (ZYLOPRIM) 100 MG tablet; Take 1 tablet (100 mg total) by mouth daily. -     apixaban (ELIQUIS) 5 MG TABS tablet; Take 1 tablet (5 mg total) by mouth 2 (two) times daily.

## 2021-01-10 NOTE — Assessment & Plan Note (Signed)
Systolic congestive heart failure chronic and compensated at this time due to cardiomyopathy ejection fraction 20%  Plan referral to heart failure clinic and refill all current heart failure medications follow-up lab data

## 2021-01-10 NOTE — Patient Instructions (Signed)
Referral to cardiology, orthopedics, physical therapy, and psychiatry was made  Refills on all your medications sent to our pharmacy  Stop by the lab for complete set of blood draws at this visit  [place] our clinical social worker behavioral therapist will contact you for further counseling  Return to see Dr. [name] in 2 months

## 2021-01-11 LAB — COMPREHENSIVE METABOLIC PANEL
ALT: 16 IU/L (ref 0–44)
AST: 23 IU/L (ref 0–40)
Albumin/Globulin Ratio: 1.5 (ref 1.2–2.2)
Albumin: 4.7 g/dL (ref 3.8–4.9)
Alkaline Phosphatase: 141 IU/L — ABNORMAL HIGH (ref 44–121)
BUN/Creatinine Ratio: 13 (ref 9–20)
BUN: 18 mg/dL (ref 6–24)
Bilirubin Total: 1.4 mg/dL — ABNORMAL HIGH (ref 0.0–1.2)
CO2: 24 mmol/L (ref 20–29)
Calcium: 9.9 mg/dL (ref 8.7–10.2)
Chloride: 101 mmol/L (ref 96–106)
Creatinine, Ser: 1.4 mg/dL — ABNORMAL HIGH (ref 0.76–1.27)
Globulin, Total: 3.1 g/dL (ref 1.5–4.5)
Glucose: 96 mg/dL (ref 65–99)
Potassium: 3.2 mmol/L — ABNORMAL LOW (ref 3.5–5.2)
Sodium: 142 mmol/L (ref 134–144)
Total Protein: 7.8 g/dL (ref 6.0–8.5)
eGFR: 59 mL/min/{1.73_m2} — ABNORMAL LOW (ref >59)

## 2021-01-11 LAB — CBC WITH DIFFERENTIAL/PLATELET
Basophils Absolute: 0.1 10*3/uL (ref 0.0–0.2)
Basos: 1 %
EOS (ABSOLUTE): 0.2 10*3/uL (ref 0.0–0.4)
Eos: 4 %
Hematocrit: 45.6 % (ref 37.5–51.0)
Hemoglobin: 14.6 g/dL (ref 13.0–17.7)
Immature Grans (Abs): 0 10*3/uL (ref 0.0–0.1)
Immature Granulocytes: 0 %
Lymphocytes Absolute: 1.8 10*3/uL (ref 0.7–3.1)
Lymphs: 36 %
MCH: 28.2 pg (ref 26.6–33.0)
MCHC: 32 g/dL (ref 31.5–35.7)
MCV: 88 fL (ref 79–97)
Monocytes Absolute: 0.5 10*3/uL (ref 0.1–0.9)
Monocytes: 11 %
Neutrophils Absolute: 2.5 10*3/uL (ref 1.4–7.0)
Neutrophils: 48 %
Platelets: 259 10*3/uL (ref 150–450)
RBC: 5.17 x10E6/uL (ref 4.14–5.80)
RDW: 14.2 % (ref 11.6–15.4)
WBC: 5.1 10*3/uL (ref 3.4–10.8)

## 2021-01-11 NOTE — Telephone Encounter (Signed)
First attempt to reach pt, no answer, left vm.

## 2021-01-14 NOTE — Telephone Encounter (Signed)
Spoke with pt and scheduled an appt for 02/06/21. Pt reports that he has an appt with [facility] for 03/05/21 with an LCSW. I encouraged pt to call so that he can get scheduled to see a psychiatrist since it appears that he is already established with them. Encouraged pt to call back if any other concerns arise.

## 2021-01-15 ENCOUNTER — Other Ambulatory Visit: Payer: Self-pay | Admitting: Critical Care Medicine

## 2021-01-15 ENCOUNTER — Telehealth: Payer: Self-pay

## 2021-01-15 NOTE — Telephone Encounter (Signed)
-----   Message from [name], MD sent at 01/11/2021  6:56 AM EDT ----- Let pt know labs all ok.  Kidney liver blood counts normal/stable.  Potassium slightly low.  Stay on potassium tablets as prescribed

## 2021-01-15 NOTE — Telephone Encounter (Signed)
Contacted pt to go over lab results pt did answer and is aware of results.

## 2021-01-16 ENCOUNTER — Other Ambulatory Visit: Payer: Self-pay | Admitting: Critical Care Medicine

## 2021-01-16 DIAGNOSIS — J4520 Mild intermittent asthma, uncomplicated: Secondary | ICD-10-CM

## 2021-01-16 DIAGNOSIS — G8929 Other chronic pain: Secondary | ICD-10-CM

## 2021-01-16 DIAGNOSIS — M5442 Lumbago with sciatica, left side: Secondary | ICD-10-CM

## 2021-01-16 NOTE — Telephone Encounter (Signed)
Last Rx- written as no print- Rx sent to pharmacy again

## 2021-01-17 ENCOUNTER — Other Ambulatory Visit: Payer: Self-pay

## 2021-01-22 ENCOUNTER — Ambulatory Visit: Payer: Medicare Other | Admitting: Physical Therapy

## 2021-01-22 ENCOUNTER — Other Ambulatory Visit: Payer: Self-pay | Admitting: Critical Care Medicine

## 2021-01-22 DIAGNOSIS — J4520 Mild intermittent asthma, uncomplicated: Secondary | ICD-10-CM

## 2021-01-22 DIAGNOSIS — G8929 Other chronic pain: Secondary | ICD-10-CM

## 2021-01-22 DIAGNOSIS — M5442 Lumbago with sciatica, left side: Secondary | ICD-10-CM

## 2021-01-22 NOTE — Telephone Encounter (Signed)
Signed 6 days ago 01/16/21 last refill documented as 01/15/21

## 2021-01-23 ENCOUNTER — Other Ambulatory Visit: Payer: Self-pay | Admitting: Critical Care Medicine

## 2021-01-23 DIAGNOSIS — M5442 Lumbago with sciatica, left side: Secondary | ICD-10-CM

## 2021-01-23 DIAGNOSIS — G8929 Other chronic pain: Secondary | ICD-10-CM

## 2021-01-23 DIAGNOSIS — J4520 Mild intermittent asthma, uncomplicated: Secondary | ICD-10-CM

## 2021-01-24 ENCOUNTER — Ambulatory Visit: Payer: Medicare Other | Attending: Critical Care Medicine | Admitting: Physical Therapy

## 2021-01-24 ENCOUNTER — Encounter: Payer: Self-pay | Admitting: Physical Therapy

## 2021-01-24 ENCOUNTER — Encounter: Payer: Self-pay | Admitting: Surgery

## 2021-01-24 ENCOUNTER — Other Ambulatory Visit: Payer: Self-pay

## 2021-01-24 ENCOUNTER — Ambulatory Visit: Payer: Self-pay

## 2021-01-24 ENCOUNTER — Ambulatory Visit (INDEPENDENT_AMBULATORY_CARE_PROVIDER_SITE_OTHER): Payer: Medicare Other | Admitting: Surgery

## 2021-01-24 VITALS — BP 108/80 | HR 111 | Ht 68.0 in | Wt 237.6 lb

## 2021-01-24 DIAGNOSIS — G8929 Other chronic pain: Secondary | ICD-10-CM | POA: Diagnosis not present

## 2021-01-24 DIAGNOSIS — M5441 Lumbago with sciatica, right side: Secondary | ICD-10-CM | POA: Diagnosis not present

## 2021-01-24 DIAGNOSIS — R262 Difficulty in walking, not elsewhere classified: Secondary | ICD-10-CM | POA: Insufficient documentation

## 2021-01-24 DIAGNOSIS — M6281 Muscle weakness (generalized): Secondary | ICD-10-CM | POA: Insufficient documentation

## 2021-01-24 DIAGNOSIS — M5416 Radiculopathy, lumbar region: Secondary | ICD-10-CM

## 2021-01-24 DIAGNOSIS — M545 Low back pain, unspecified: Secondary | ICD-10-CM | POA: Diagnosis not present

## 2021-01-24 DIAGNOSIS — M79604 Pain in right leg: Secondary | ICD-10-CM | POA: Diagnosis not present
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
## 2021-02-01 ENCOUNTER — Other Ambulatory Visit: Payer: Self-pay

## 2021-02-01 ENCOUNTER — Ambulatory Visit: Payer: Medicare Other | Admitting: Physical Therapy

## 2021-02-01 ENCOUNTER — Encounter: Payer: Self-pay | Admitting: Physical Therapy

## 2021-02-01 DIAGNOSIS — R262 Difficulty in walking, not elsewhere classified: Secondary | ICD-10-CM

## 2021-02-01 DIAGNOSIS — M6281 Muscle weakness (generalized): Secondary | ICD-10-CM

## 2021-02-01 DIAGNOSIS — G8929 Other chronic pain: Secondary | ICD-10-CM

## 2021-02-01 DIAGNOSIS — M79604 Pain in right leg: Secondary | ICD-10-CM | POA: Diagnosis not present

## 2021-02-01 DIAGNOSIS — M545 Low back pain, unspecified: Secondary | ICD-10-CM | POA: Diagnosis not present

## 2021-02-01 NOTE — Patient Instructions (Signed)
TENS stands for Transcutaneous Electrical Nerve Stimulation. In other words, electrical impulses are allowed to pass through the skin in order to excite a nerve.   Purpose and Use of TENS:  TENS is a method used to manage acute and chronic pain without the use of drugs. It has been effective in managing pain associated with surgery, sprains, strains, trauma, rheumatoid arthritis, and neuralgias. It is a non-addictive, low risk, and non-invasive technique used to control pain. It is not, by any means, a curative form of treatment.   How TENS Works:  Most TENS units are a Paramedic unit powered by one 9 volt battery. Attached to the outside of the unit are two lead wires where two pins and/or snaps connect on each wire. All units come with a set of four reusable pads or electrodes. These are placed on the skin surrounding the area involved. By inserting the leads into  the pads, the electricity can pass from the unit making the circuit complete.  As the intensity is turned up slowly, the electrical current enters the body from the electrodes through the skin to the surrounding nerve fibers. This triggers the release of hormones from within the body. These hormones contain pain relievers. By increasing the circulation of these hormones, the person's pain may be lessened. It is also believed that the electrical stimulation itself helps to block the pain messages being sent to the brain, thus also decreasing the body's perception of pain.   Hazards:  TENS units are NOT to be used by patients with PACEMAKERS, DEFIBRILLATORS, DIABETIC PUMPS, PREGNANT WOMEN, and patients with SEIZURE DISORDERS.  TENS units are NOT to be used over the heart, throat, brain, or spinal cord.  One of the major side effects from the TENS unit may be skin irritation. Some people may develop a rash if they are sensitive to the materials used in the electrodes or the connecting wires.   Wear the unit for 15 min.   Avoid  overuse due the body getting used to the stem making it not as effective over time.   TENS UNIT  This is helpful for muscle pain and spasm.   Search and Purchase a TENS 7000 2nd edition at www.tenspros.com or www.amazon.com  (It should be less than $30)     TENS unit instructions:  Do not shower or bathe with the unit on Turn the unit off before removing electrodes or batteries If the electrodes lose stickiness add a drop of water to the electrodes after they are disconnected from the unit and place on plastic sheet. If you continued to have difficulty, call the TENS unit company to purchase more electrodes. Do not apply lotion on the skin area prior to use. Make sure the skin is clean and dry as this will help prolong the life of the electrodes. After use, always check skin for unusual red areas, rash or other skin difficulties. If there are any skin problems, does not apply electrodes to the same area. Never remove the electrodes from the unit by pulling the wires. Do not use the TENS unit or electrodes other than as directed. Do not change electrode placement without consulting your therapist or physician. Keep 2 fingers with between each electrode.
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
## 2021-02-06 ENCOUNTER — Ambulatory Visit: Payer: Medicare Other | Attending: Critical Care Medicine | Admitting: Physical Therapy

## 2021-02-06 ENCOUNTER — Encounter: Payer: Self-pay | Admitting: Physical Therapy

## 2021-02-06 ENCOUNTER — Other Ambulatory Visit: Payer: Self-pay

## 2021-02-06 ENCOUNTER — Ambulatory Visit: Payer: Medicare Other | Attending: Critical Care Medicine | Admitting: Clinical

## 2021-02-06 DIAGNOSIS — M6281 Muscle weakness (generalized): Secondary | ICD-10-CM | POA: Diagnosis not present

## 2021-02-06 DIAGNOSIS — G8929 Other chronic pain: Secondary | ICD-10-CM | POA: Diagnosis not present

## 2021-02-06 DIAGNOSIS — F3162 Bipolar disorder, current episode mixed, moderate: Secondary | ICD-10-CM | POA: Diagnosis not present

## 2021-02-06 DIAGNOSIS — R262 Difficulty in walking, not elsewhere classified: Secondary | ICD-10-CM | POA: Diagnosis not present

## 2021-02-06 DIAGNOSIS — M545 Low back pain, unspecified: Secondary | ICD-10-CM | POA: Diagnosis not present

## 2021-02-06 DIAGNOSIS — M79604 Pain in right leg: Secondary | ICD-10-CM | POA: Diagnosis not present
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
## 2021-02-09 ENCOUNTER — Other Ambulatory Visit: Payer: Self-pay | Admitting: Surgery

## 2021-02-09 DIAGNOSIS — M5441 Lumbago with sciatica, right side: Secondary | ICD-10-CM

## 2021-02-09 DIAGNOSIS — M5416 Radiculopathy, lumbar region: Secondary | ICD-10-CM

## 2021-02-12 ENCOUNTER — Other Ambulatory Visit: Payer: Self-pay

## 2021-02-12 ENCOUNTER — Encounter: Payer: Self-pay | Admitting: Physical Therapy

## 2021-02-12 ENCOUNTER — Ambulatory Visit: Payer: Medicare Other | Admitting: Physical Therapy

## 2021-02-12 DIAGNOSIS — M6281 Muscle weakness (generalized): Secondary | ICD-10-CM

## 2021-02-12 DIAGNOSIS — M79604 Pain in right leg: Secondary | ICD-10-CM | POA: Diagnosis not present

## 2021-02-12 DIAGNOSIS — G8929 Other chronic pain: Secondary | ICD-10-CM

## 2021-02-12 DIAGNOSIS — M545 Low back pain, unspecified: Secondary | ICD-10-CM

## 2021-02-12 DIAGNOSIS — R262 Difficulty in walking, not elsewhere classified: Secondary | ICD-10-CM
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
## 2021-02-13 ENCOUNTER — Other Ambulatory Visit: Payer: Self-pay | Admitting: Critical Care Medicine

## 2021-02-13 DIAGNOSIS — G8929 Other chronic pain: Secondary | ICD-10-CM

## 2021-02-13 DIAGNOSIS — J4520 Mild intermittent asthma, uncomplicated: Secondary | ICD-10-CM

## 2021-02-14 NOTE — Telephone Encounter (Signed)
Requested Prescriptions  Pending Prescriptions Disp Refills  . albuterol (VENTOLIN HFA) 108 (90 Base) MCG/ACT inhaler [Pharmacy Med Name: ALBUTEROL HFA *PROV* 90MCG 108 (90 BAS Aerosol] 6.7 g 10    Sig: INHALE TWO (2) PUFFS BY MOUTH EVERY 6 HOURS AS NEEDED FOR SHORTNESS OF BREATH OF WHEEZING *NEEDS APPOINTMENT*     Pulmonology:  Beta Agonists Failed - 02/13/2021  6:44 PM      Failed - One inhaler should last at least one month. If the patient is requesting refills earlier, contact the patient to check for uncontrolled symptoms.      Passed - Valid encounter within last 12 months    Recent Outpatient Visits          1 month ago Cerebrovascular accident (CVA) due to embolism of precerebral artery [facility])   [place] [name], MD   7 months ago Ischemic cardiomyopathy   [place] [name], MD   8 months ago Right hip pain   [place] [name], MD   11 months ago Essential hypertension   [place], MD   11 months ago Coronary artery disease involving native coronary artery of native heart with angina pectoris [facility])   [place] [name], MD      Future Appointments            Tomorrow Marybelle Killings, MD [place]   In 3 weeks [name], MD [place]           . allopurinol (ZYLOPRIM) 100 MG tablet [Pharmacy Med Name: ALLOPURINOL 100 MG TABS 100 Tablet] 30 tablet 10    Sig: TAKE 1 TABLET BY MOUTH DAILY     Endocrinology:  Gout Agents Failed - 02/13/2021  6:44 PM      Failed - Uric Acid in normal range and within 360 days    Uric Acid  Date Value Ref Range Status  09/26/2019 8.7 (H) 3.8 - 8.4 mg/dL Final    Comment:               Therapeutic target for gout patients: <6.0         Failed - Cr in normal range and  within 360 days    Creatinine, Ser  Date Value Ref Range Status  01/10/2021 1.40 (H) 0.76 - 1.27 mg/dL Final         Passed - Valid encounter within last 12 months    Recent Outpatient Visits          1 month ago Cerebrovascular accident (CVA) due to embolism of precerebral artery ([place])   [place] [name], MD   7 months ago Ischemic cardiomyopathy   [place] [name], MD   8 months ago Right hip pain   [place] [name], MD   11 months ago Essential hypertension   [place], Patrick E, MD   11 months ago Coronary artery disease involving native coronary artery of native heart with angina pectoris [facility])   [place] [name], MD      Future Appointments            Tomorrow Marybelle Killings,  MD [place]   In 3 weeks [name] Burnett Harry, MD [place]           . tamsulosin (FLOMAX) 0.4 MG CAPS capsule [Pharmacy Med Name: TAMSULOSIN 0.4 MG CAPS 0.4 Capsule] 30 capsule 10    Sig: TAKE 1 CAPSULE BY MOUTH DAILY     Urology: Alpha-Adrenergic Blocker Passed - 02/13/2021  6:44 PM      Passed - Last BP in normal range    BP Readings from Last 1 Encounters:  01/24/21 108/80         Passed - Valid encounter within last 12 months    Recent Outpatient Visits          1 month ago Cerebrovascular accident (CVA) due to embolism of precerebral artery ([place])   [place] [name], MD   7 months ago Ischemic cardiomyopathy   [place] [name], MD   8 months ago Right hip pain   [place] [name], MD   11 months ago Essential hypertension   [place], MD   11 months ago  Coronary artery disease involving native coronary artery of native heart with angina pectoris [facility])   [place] [name], MD      Future Appointments            Tomorrow Marybelle Killings, MD [place]   In 3 weeks [name], MD [place]

## 2021-02-15 ENCOUNTER — Ambulatory Visit: Payer: Medicaid Other | Admitting: Orthopaedic Surgery

## 2021-02-15 NOTE — BH Specialist Note (Signed)
Integrated Behavioral Health Initial In-Person Visit  MRN: [id number] Name: Cody Guzman  Number of [place] Clinician visits:: 1/6 Session Start time: 4:00pm  Session End time: 5:00pm Total time: 60 minutes  Types of Service: Individual psychotherapy  Interpretor:Yes.   Interpretor Name and Language: N/A   Warm Hand Off Completed.        Subjective: Cody Guzman is a 55 y.o. male accompanied by  self Patient was referred by PCP Cody Guzman for depression. Patient reports the following symptoms/concerns: Reports feeling depressed, irritability, trouble sleeping, trouble concentrating, alternating between increased and decreased energy, racing thoughts, fidgeting, anxiousness, excessive worrying, trouble relaxing, restlessness, and impulsivity. Reports a hx of engaging in risky behavior. Reports difficulty managing anger which has led to pt being arrested in the past and going to prison. Reports also worrying about physical health changes. Reports a hx of bipolar disorder. Duration of problem: 20 years; Severity of problem: moderate  Objective: Mood: Anxious and Affect: Appropriate Risk of harm to self or others: No plan to harm self or others  Life Context: Family and Social: Reports having family and current relationship as support system. School/Work: Pt is currently unemployed and receives disability.  Self-Care: Denies substance use. Reports a hx of involvement in medication management and he was taking Lithium. Reports spending time with significant other as coping skill. Life Changes: Reports a hx of being in prison. Reports he was recently arrested as a result of difficulty with anger. Reports he also has had physical health changes.   Patient and/or Family's Strengths/Protective Factors: Concrete supports in place (healthy food, safe environments, etc.) and Sense of purpose  Goals Addressed: Patient will: Reduce symptoms of: agitation,  anxiety, depression, and mood instability Increase knowledge and/or ability of: coping skills and self-management skills  Demonstrate ability to: Increase healthy adjustment to current life circumstances  Progress towards Goals: Ongoing  Interventions: Interventions utilized: Mindfulness or Psychologist, educational, CBT Cognitive Behavioral Therapy, Supportive Counseling, and Psychoeducation and/or Health Education  Standardized Assessments completed:  MDQ (Mood Disorder Questionnaire) , GAD-7, and PHQ 9  Patient and/or Family Response: Pt receptive to tx. Pt receptive to psychoeducation provided on depression, bipolar disorder, and anxiety. Pt receptive to cognitive restructuring to decrease pt worries and assistance with improving cognitive processing skills. Pt receptive to utilizing deep breathing exercises to assist with managing anger. Pt receptive to being referred for psychiatry.  Patient Centered Plan: Patient is on the following Treatment Plan(s):  Bipolar Disorder, anxiety  Assessment: Denies SI/HI. Denies auditory/visual hallucinations. No safety risks. Patient currently experiencing mixture of depressive and manic episode. Pt exhibits pressured speech and appears to have unrealistic goals. Pt also has difficulty sleeping and has gone nights without sleep in the past and has difficulty falling asleep. Pt is also having difficulty adjusting to physical health changes and experiences worry.   Patient may benefit from psychiatry to assist with bipolar disorder due to pt having a hx of taking lithium. LCSWA provided psychoeducation on depression, bipolar disorder, and anxiety. LCSWA utilized cognitive restructuring to decrease pt worries and assist with cognitive processing. LCSWA encouraged pt to utilize deep breathing exercises. LCSWA will have pt referred to psychiatry as he has not scheduled appt with [place]. LCSWA will fu with pt.  Plan: Follow up with behavioral health clinician on :  02/25/21 Behavioral recommendations: Utilize deep breathing exercises Referral(s): [place] (In Clinic) "From scale of 1-10, how likely are you to follow plan?": 10  Casper Pagliuca C Cale Bethard, LCSW

## 2021-02-19 ENCOUNTER — Ambulatory Visit: Payer: Medicare Other | Admitting: Physical Therapy

## 2021-02-19 ENCOUNTER — Other Ambulatory Visit: Payer: Self-pay

## 2021-02-19 DIAGNOSIS — R262 Difficulty in walking, not elsewhere classified: Secondary | ICD-10-CM | POA: Diagnosis not present

## 2021-02-19 DIAGNOSIS — M79604 Pain in right leg: Secondary | ICD-10-CM | POA: Diagnosis not present

## 2021-02-19 DIAGNOSIS — M6281 Muscle weakness (generalized): Secondary | ICD-10-CM

## 2021-02-19 DIAGNOSIS — G8929 Other chronic pain: Secondary | ICD-10-CM

## 2021-02-19 DIAGNOSIS — M545 Low back pain, unspecified: Secondary | ICD-10-CM | POA: Diagnosis not present
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
## 2021-02-20 ENCOUNTER — Other Ambulatory Visit: Payer: Self-pay | Admitting: Critical Care Medicine

## 2021-02-20 ENCOUNTER — Other Ambulatory Visit: Payer: Self-pay

## 2021-02-21 ENCOUNTER — Telehealth: Payer: Self-pay

## 2021-02-21 ENCOUNTER — Other Ambulatory Visit: Payer: Self-pay | Admitting: Critical Care Medicine

## 2021-02-21 NOTE — Telephone Encounter (Signed)
Requested medications are due for refill today.  yes  Requested medications are on the active medications list.  yes  Last refill. 01/10/2021  Future visit scheduled.   yes  Notes to clinic.  Medication not delegated. Rx is expired.

## 2021-02-21 NOTE — Telephone Encounter (Signed)
Referral made on 02/21/2021

## 2021-02-22 ENCOUNTER — Other Ambulatory Visit: Payer: Self-pay | Admitting: Critical Care Medicine

## 2021-02-22 DIAGNOSIS — I10 Essential (primary) hypertension: Secondary | ICD-10-CM

## 2021-02-22 DIAGNOSIS — I509 Heart failure, unspecified: Secondary | ICD-10-CM

## 2021-02-22 NOTE — Telephone Encounter (Signed)
Requested medication (s) are due for refill today: Yes  Requested medication (s) are on the active medication list: Yes  Last refill:  01/10/21  Future visit scheduled: No  Notes to clinic:  Unable to refill per protocol, cannot delegate.      Requested Prescriptions  Pending Prescriptions Disp Refills   QUEtiapine (SEROQUEL) 100 MG tablet [Pharmacy Med Name: QUETIAPINE 100MG  TAB 100 Tablet] 30 tablet 10    Sig: TAKE 1 TABLET BY MOUTH AT BEDTIME     Not Delegated - Psychiatry:  Antipsychotics - Second Generation ([place]) - quetiapine Failed - 02/21/2021  6:15 PM      Failed - This refill cannot be delegated      Passed - ALT in normal range and within 180 days    ALT  Date Value Ref Range Status  01/10/2021 16 0 - 44 IU/L Final          Passed - AST in normal range and within 180 days    AST  Date Value Ref Range Status  01/10/2021 23 0 - 40 IU/L Final          Passed - Completed PHQ-2 or PHQ-9 in the last 360 days      Passed - Last BP in normal range    BP Readings from Last 1 Encounters:  01/24/21 108/80          Passed - Valid encounter within last 6 months    Recent Outpatient Visits           1 month ago Cerebrovascular accident (CVA) due to embolism of precerebral artery ([place])   [place] [name], MD   7 months ago Ischemic cardiomyopathy   [place], MD   8 months ago Right hip pain   [place] [name], MD   11 months ago Essential hypertension   [place], MD   11 months ago Coronary artery disease involving native coronary artery of native heart with angina pectoris [facility])   [place] [name], MD       Future Appointments             In 2 weeks [name], MD [place]

## 2021-02-24 NOTE — Telephone Encounter (Signed)
Have tried multiple times to process these prescriptions. I get an advisory to complete smart link. When tried again the prescription wont send.

## 2021-02-25 ENCOUNTER — Other Ambulatory Visit: Payer: Self-pay

## 2021-02-25 ENCOUNTER — Ambulatory Visit: Payer: Medicare HMO | Admitting: Critical Care Medicine

## 2021-02-25 ENCOUNTER — Ambulatory Visit
Admission: RE | Admit: 2021-02-25 | Discharge: 2021-02-25 | Disposition: A | Discharge status: Home / Self Care | Payer: Medicare Other | Source: Ambulatory Visit | Attending: Surgery | Admitting: Surgery

## 2021-02-25 ENCOUNTER — Ambulatory Visit: Payer: Medicare Other | Admitting: Clinical

## 2021-02-25 DIAGNOSIS — M5441 Lumbago with sciatica, right side: Secondary | ICD-10-CM

## 2021-02-25 DIAGNOSIS — M48061 Spinal stenosis, lumbar region without neurogenic claudication: Secondary | ICD-10-CM | POA: Diagnosis not present

## 2021-02-25 DIAGNOSIS — M5416 Radiculopathy, lumbar region: Secondary | ICD-10-CM

## 2021-02-27 ENCOUNTER — Ambulatory Visit: Payer: Medicare Other | Admitting: Physical Therapy

## 2021-03-05 ENCOUNTER — Encounter (HOSPITAL_COMMUNITY): Payer: Self-pay | Admitting: Licensed Clinical Social Worker

## 2021-03-05 ENCOUNTER — Other Ambulatory Visit: Payer: Self-pay

## 2021-03-05 ENCOUNTER — Ambulatory Visit (INDEPENDENT_AMBULATORY_CARE_PROVIDER_SITE_OTHER): Payer: Medicare Other | Admitting: Licensed Clinical Social Worker

## 2021-03-05 DIAGNOSIS — F319 Bipolar disorder, unspecified: Secondary | ICD-10-CM

## 2021-03-05 DIAGNOSIS — F4310 Post-traumatic stress disorder, unspecified: Secondary | ICD-10-CM
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
## 2021-03-05 NOTE — Plan of Care (Signed)
Pt agreeable to plan  ?

## 2021-03-06 ENCOUNTER — Ambulatory Visit: Payer: Medicare Other | Attending: Critical Care Medicine | Admitting: Physical Therapy

## 2021-03-06 ENCOUNTER — Encounter: Payer: Self-pay | Admitting: Physical Therapy

## 2021-03-06 ENCOUNTER — Ambulatory Visit: Payer: Medicare Other | Attending: Critical Care Medicine | Admitting: Clinical

## 2021-03-06 DIAGNOSIS — M79604 Pain in right leg: Secondary | ICD-10-CM | POA: Insufficient documentation

## 2021-03-06 DIAGNOSIS — F3162 Bipolar disorder, current episode mixed, moderate: Secondary | ICD-10-CM | POA: Diagnosis not present

## 2021-03-06 DIAGNOSIS — M545 Low back pain, unspecified: Secondary | ICD-10-CM | POA: Insufficient documentation

## 2021-03-06 DIAGNOSIS — R262 Difficulty in walking, not elsewhere classified: Secondary | ICD-10-CM | POA: Diagnosis not present

## 2021-03-06 DIAGNOSIS — G8929 Other chronic pain: Secondary | ICD-10-CM | POA: Insufficient documentation

## 2021-03-06 DIAGNOSIS — M6281 Muscle weakness (generalized): Secondary | ICD-10-CM | POA: Insufficient documentation

## 2021-03-06 NOTE — Patient Instructions (Signed)
Access Code: [email or web address] URL: [email or web address] Date: 03/06/2021 Prepared by: Ethel Rana  Exercises Hooklying Isometric Hip Abduction Adduction with Belt and Ball - 1 x daily - 7 x weekly - 2 sets - 10 reps Supine Bridge with Mini Swiss Ball Between Knees - 1 x daily - 7 x weekly - 2 sets - 10 reps
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
## 2021-03-12 ENCOUNTER — Encounter: Payer: Self-pay | Admitting: Critical Care Medicine

## 2021-03-12 ENCOUNTER — Other Ambulatory Visit: Payer: Self-pay

## 2021-03-12 ENCOUNTER — Ambulatory Visit: Payer: Medicare Other | Attending: Critical Care Medicine | Admitting: Critical Care Medicine

## 2021-03-12 VITALS — BP 102/72 | HR 95 | Resp 16 | Wt 237.6 lb

## 2021-03-12 DIAGNOSIS — I255 Ischemic cardiomyopathy: Secondary | ICD-10-CM

## 2021-03-12 DIAGNOSIS — I509 Heart failure, unspecified: Secondary | ICD-10-CM

## 2021-03-12 DIAGNOSIS — E876 Hypokalemia: Secondary | ICD-10-CM | POA: Diagnosis not present

## 2021-03-12 DIAGNOSIS — N182 Chronic kidney disease, stage 2 (mild): Secondary | ICD-10-CM

## 2021-03-12 DIAGNOSIS — E782 Mixed hyperlipidemia: Secondary | ICD-10-CM

## 2021-03-12 DIAGNOSIS — I25119 Atherosclerotic heart disease of native coronary artery with unspecified angina pectoris: Secondary | ICD-10-CM | POA: Diagnosis not present

## 2021-03-12 DIAGNOSIS — I10 Essential (primary) hypertension: Secondary | ICD-10-CM

## 2021-03-12 DIAGNOSIS — I6389 Other cerebral infarction: Secondary | ICD-10-CM

## 2021-03-12 DIAGNOSIS — M545 Low back pain, unspecified: Secondary | ICD-10-CM

## 2021-03-12 DIAGNOSIS — I5042 Chronic combined systolic (congestive) and diastolic (congestive) heart failure: Secondary | ICD-10-CM | POA: Diagnosis not present

## 2021-03-12 DIAGNOSIS — H9325 Central auditory processing disorder: Secondary | ICD-10-CM

## 2021-03-12 DIAGNOSIS — F17210 Nicotine dependence, cigarettes, uncomplicated: Secondary | ICD-10-CM

## 2021-03-12 DIAGNOSIS — F319 Bipolar disorder, unspecified: Secondary | ICD-10-CM

## 2021-03-12 DIAGNOSIS — I472 Ventricular tachycardia, unspecified: Secondary | ICD-10-CM

## 2021-03-12 DIAGNOSIS — Z1211 Encounter for screening for malignant neoplasm of colon: Secondary | ICD-10-CM | POA: Diagnosis not present

## 2021-03-12 DIAGNOSIS — I69328 Other speech and language deficits following cerebral infarction: Secondary | ICD-10-CM

## 2021-03-12 MED ORDER — CARVEDILOL 6.25 MG PO TABS
6.2500 mg | ORAL_TABLET | Freq: Two times a day (BID) | ORAL | 4 refills | Status: DC
  Filled 2021-03-12: qty 180, 90d supply, fill #0

## 2021-03-12 MED ORDER — SPIRONOLACTONE 25 MG PO TABS
12.5000 mg | ORAL_TABLET | Freq: Every day | ORAL | 4 refills | Status: DC
  Filled 2021-03-12: qty 45, 90d supply, fill #0

## 2021-03-12 NOTE — Assessment & Plan Note (Signed)
Systolic heart failure ejection fraction less than 20% currently compensated at this time  I question if his cardiac medications could be consolidated and would like to get him into the heart failure clinic.  We will make yet another attempt to get him into that clinic

## 2021-03-12 NOTE — Assessment & Plan Note (Signed)
  .   Current smoking consumption amount: Less than 5 cigarettes daily  Dicsussion on advise to quit smoking and smoking impacts: Cardiovascular impacts . Patient's willingness to quit: Willing to quit  . Methods to quit smoking discussed: Behavioral modification  . Medication management of smoking session drugs discussed: Not indicated  . Resources provided:  AVS   . Setting quit date not established  . Follow-up arranged 2 months   Time spent counseling the patient: 5 minutes

## 2021-03-12 NOTE — Assessment & Plan Note (Signed)
Referral to psychiatry again made

## 2021-03-12 NOTE — Assessment & Plan Note (Signed)
Referral to chiropractic made

## 2021-03-12 NOTE — Assessment & Plan Note (Signed)
Continue high-dose Lipitor

## 2021-03-12 NOTE — Patient Instructions (Addendum)
You declined flu vaccine and pneumonia vaccine  Referral to heart failure clinic was made  Referral to psychiatry was made  Chiropractic referral was made  Referral to speech-language pathology was made  Cologuard kit will be mailed to your home  Metabolic panel for labs today to check your potassium  Refills on your medicines that you are about to run out of refills were sent to our pharmacy  Return to see Dr. [name] 3 months

## 2021-03-12 NOTE — Assessment & Plan Note (Addendum)
No evidence of recurrence observe  Continue amiodarone

## 2021-03-12 NOTE — Assessment & Plan Note (Signed)
Continue Lipitor therapy

## 2021-03-12 NOTE — Assessment & Plan Note (Signed)
As per heart failure assessment

## 2021-03-12 NOTE — Assessment & Plan Note (Signed)
Currently with difficulty auditory processing after multifocal ischemic stroke  Referral to speech therapy was made continue physical therapy
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
## 2021-03-12 NOTE — Assessment & Plan Note (Signed)
Blood pressure well controlled continue current medications refills given

## 2021-03-12 NOTE — Assessment & Plan Note (Signed)
Monitor renal function since patient is on diuretic and spironolactone

## 2021-03-13 ENCOUNTER — Other Ambulatory Visit: Payer: Self-pay | Admitting: Critical Care Medicine

## 2021-03-13 ENCOUNTER — Other Ambulatory Visit: Payer: Self-pay

## 2021-03-13 ENCOUNTER — Encounter: Payer: Self-pay | Admitting: Physical Therapy

## 2021-03-13 ENCOUNTER — Ambulatory Visit: Payer: Medicare Other | Admitting: Physical Therapy

## 2021-03-13 ENCOUNTER — Telehealth: Payer: Self-pay

## 2021-03-13 DIAGNOSIS — G8929 Other chronic pain: Secondary | ICD-10-CM | POA: Diagnosis not present

## 2021-03-13 DIAGNOSIS — R262 Difficulty in walking, not elsewhere classified: Secondary | ICD-10-CM

## 2021-03-13 DIAGNOSIS — M6281 Muscle weakness (generalized): Secondary | ICD-10-CM

## 2021-03-13 DIAGNOSIS — M545 Low back pain, unspecified: Secondary | ICD-10-CM | POA: Diagnosis not present

## 2021-03-13 DIAGNOSIS — M79604 Pain in right leg: Secondary | ICD-10-CM

## 2021-03-13 LAB — BASIC METABOLIC PANEL
BUN/Creatinine Ratio: 10 (ref 9–20)
BUN: 14 mg/dL (ref 6–24)
CO2: 28 mmol/L (ref 20–29)
Calcium: 9.7 mg/dL (ref 8.7–10.2)
Chloride: 101 mmol/L (ref 96–106)
Creatinine, Ser: 1.35 mg/dL — ABNORMAL HIGH (ref 0.76–1.27)
Glucose: 88 mg/dL (ref 70–99)
Potassium: 3.1 mmol/L — ABNORMAL LOW (ref 3.5–5.2)
Sodium: 143 mmol/L (ref 134–144)
eGFR: 61 mL/min/{1.73_m2} (ref >59)

## 2021-03-13 MED ORDER — POTASSIUM CHLORIDE CRYS ER 20 MEQ PO TBCR
40.0000 meq | EXTENDED_RELEASE_TABLET | Freq: Every day | ORAL | 3 refills | Status: DC
  Filled 2021-03-13: qty 60, 30d supply, fill #0

## 2021-03-13 NOTE — Telephone Encounter (Signed)
Pt was called and is aware of results, DOB was confirmed.  ?

## 2021-03-13 NOTE — Telephone Encounter (Signed)
-----   Message from [name], MD sent at 03/13/2021  8:19 AM EST ----- Let [name] know potassium is still low due to fluid medication.  I sent an Rx refill  for potassium supplement daily to our pharmacy , make sure he is taking this two tablets daily.
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
## 2021-03-14 ENCOUNTER — Other Ambulatory Visit: Payer: Self-pay | Admitting: Critical Care Medicine

## 2021-03-15 NOTE — Telephone Encounter (Signed)
Requested Prescriptions  Pending Prescriptions Disp Refills  . colchicine 0.6 MG tablet [Pharmacy Med Name: COLCHICINE 0.6 MG TABS 0.6 Tablet] 15 tablet 10    Sig: TAKE 1 TABLET BY MOUTH EVERY OTHER DAY     Endocrinology:  Gout Agents Failed - 03/14/2021  7:12 PM      Failed - Uric Acid in normal range and within 360 days    Uric Acid  Date Value Ref Range Status  09/26/2019 8.7 (H) 3.8 - 8.4 mg/dL Final    Comment:               Therapeutic target for gout patients: <6.0         Failed - Cr in normal range and within 360 days    Creatinine, Ser  Date Value Ref Range Status  03/12/2021 1.35 (H) 0.76 - 1.27 mg/dL Final         Passed - Valid encounter within last 12 months    Recent Outpatient Visits          3 days ago Ischemic cardiomyopathy   [place], MD   2 months ago Cerebrovascular accident (CVA) due to embolism of precerebral artery [facility])   [place] [name], MD   8 months ago Ischemic cardiomyopathy   [place] [name], MD   9 months ago Right hip pain   [place], Patrick E, MD   1 year ago Essential hypertension   [place], MD      Future Appointments            In 2 months [name], MD [place]           . BIDIL 20-37.5 MG tablet [Pharmacy Med Name: BIDIL TABS 20-37.5 Tablet] 90 tablet 10    Sig: TAKE 1 TABLET BY MOUTH THREE TIMES DAILY     Cardiovascular:  Vasodilators Passed - 03/14/2021  7:12 PM      Passed - HCT in normal range and within 360 days    Hematocrit  Date Value Ref Range Status  01/10/2021 45.6 37.5 - 51.0 % Final         Passed - HGB in normal range and within 360 days    Hemoglobin  Date Value Ref Range Status  01/10/2021 14.6 13.0 - 17.7 g/dL Final         Passed -  RBC in normal range and within 360 days    RBC  Date Value Ref Range Status  01/10/2021 5.17 4.14 - 5.80 x10E6/uL Final  08/28/2020 4.09 (L) 4.22 - 5.81 MIL/uL Final         Passed - WBC in normal range and within 360 days    WBC  Date Value Ref Range Status  01/10/2021 5.1 3.4 - 10.8 x10E3/uL Final  08/28/2020 6.2 4.0 - 10.5 K/uL Final         Passed - PLT in normal range and within 360 days    Platelets  Date Value Ref Range Status  01/10/2021 259 150 - 450 x10E3/uL Final         Passed - Last BP in normal range    BP Readings from Last 1 Encounters:  03/12/21 102/72         Passed - Valid encounter within last 12  months    Recent Outpatient Visits          3 days ago Ischemic cardiomyopathy   [place], MD   2 months ago Cerebrovascular accident (CVA) due to embolism of precerebral artery [facility])   [place] [name], MD   8 months ago Ischemic cardiomyopathy   [place], MD   9 months ago Right hip pain   [place] [name], MD   1 year ago Essential hypertension   [place], MD      Future Appointments            In 2 months [name], MD [place]           . torsemide (DEMADEX) 20 MG tablet [Pharmacy Med Name: TORSEMIDE 20MG  TABS 20 Tablet] 30 tablet 5    Sig: TAKE 1 TABLET BY MOUTH DAILY     Cardiovascular:  Diuretics - Loop Failed - 03/14/2021  7:12 PM      Failed - K in normal range and within 360 days    Potassium  Date Value Ref Range Status  03/12/2021 3.1 (L) 3.5 - 5.2 mmol/L Final         Failed - Cr in normal range and within 360 days    Creatinine, Ser  Date Value Ref Range Status  03/12/2021 1.35 (H) 0.76 - 1.27 mg/dL Final         Passed - Ca in normal range and within  360 days    Calcium  Date Value Ref Range Status  03/12/2021 9.7 8.7 - 10.2 mg/dL Final         Passed - Na in normal range and within 360 days    Sodium  Date Value Ref Range Status  03/12/2021 143 134 - 144 mmol/L Final         Passed - Last BP in normal range    BP Readings from Last 1 Encounters:  03/12/21 102/72         Passed - Valid encounter within last 6 months    Recent Outpatient Visits          3 days ago Ischemic cardiomyopathy   [place], MD   2 months ago Cerebrovascular accident (CVA) due to embolism of precerebral artery [facility])   [place] [name], MD   8 months ago Ischemic cardiomyopathy   [place] [name], MD   9 months ago Right hip pain   [place] [name], MD   1 year ago Essential hypertension   [place], MD      Future Appointments            In 2 months [name], MD [place]

## 2021-03-15 NOTE — Telephone Encounter (Signed)
Requested medications are due for refill today yes (mail order)  Requested medications are on the active medication list yes  Last refill 03/14/21  Last visit 03/12/21  Future visit scheduled 06/11/21  Notes to clinic failed protocol of uric acid last drawn 09/26/19, greater than 360 days, please assess.  Requested Prescriptions  Pending Prescriptions Disp Refills   colchicine 0.6 MG tablet [Pharmacy Med Name: COLCHICINE 0.6 MG TABS 0.6 Tablet] 15 tablet 10    Sig: TAKE 1 TABLET BY MOUTH EVERY OTHER DAY     Endocrinology:  Gout Agents Failed - 03/14/2021  7:12 PM      Failed - Uric Acid in normal range and within 360 days    Uric Acid  Date Value Ref Range Status  09/26/2019 8.7 (H) 3.8 - 8.4 mg/dL Final    Comment:               Therapeutic target for gout patients: <6.0          Failed - Cr in normal range and within 360 days    Creatinine, Ser  Date Value Ref Range Status  03/12/2021 1.35 (H) 0.76 - 1.27 mg/dL Final          Passed - Valid encounter within last 12 months    Recent Outpatient Visits           3 days ago Ischemic cardiomyopathy   [place], Patrick E, MD   2 months ago Cerebrovascular accident (CVA) due to embolism of precerebral artery [facility])   [place] [name], MD   8 months ago Ischemic cardiomyopathy   [place] [name], MD   9 months ago Right hip pain   [place] [name], MD   1 year ago Essential hypertension   [place] [name], MD       Future Appointments             In 2 months [name], MD [place]            Signed Prescriptions Disp Refills   BIDIL 20-37.5 MG tablet 90 tablet 10    Sig: TAKE 1 TABLET BY MOUTH THREE TIMES DAILY     Cardiovascular:  Vasodilators Passed - 03/14/2021   7:12 PM      Passed - HCT in normal range and within 360 days    Hematocrit  Date Value Ref Range Status  01/10/2021 45.6 37.5 - 51.0 % Final          Passed - HGB in normal range and within 360 days    Hemoglobin  Date Value Ref Range Status  01/10/2021 14.6 13.0 - 17.7 g/dL Final          Passed - RBC in normal range and within 360 days    RBC  Date Value Ref Range Status  01/10/2021 5.17 4.14 - 5.80 x10E6/uL Final  08/28/2020 4.09 (L) 4.22 - 5.81 MIL/uL Final          Passed - WBC in normal range and within 360 days    WBC  Date Value Ref Range Status  01/10/2021 5.1 3.4 - 10.8 x10E3/uL Final  08/28/2020 6.2 4.0 - 10.5 K/uL Final          Passed - PLT in normal range and within 360 days  Platelets  Date Value Ref Range Status  01/10/2021 259 150 - 450 x10E3/uL Final          Passed - Last BP in normal range    BP Readings from Last 1 Encounters:  03/12/21 102/72          Passed - Valid encounter within last 12 months    Recent Outpatient Visits           3 days ago Ischemic cardiomyopathy   [place], MD   2 months ago Cerebrovascular accident (CVA) due to embolism of precerebral artery [facility])   [place] [name], MD   8 months ago Ischemic cardiomyopathy   [place] [name], MD   9 months ago Right hip pain   [place] [name], MD   1 year ago Essential hypertension   [place] [name], MD       Future Appointments             In 2 months [name], MD [place]             torsemide (DEMADEX) 20 MG tablet 30 tablet 5    Sig: TAKE 1 TABLET BY MOUTH DAILY     Cardiovascular:  Diuretics - Loop Failed - 03/14/2021  7:12 PM      Failed - K in normal range and within 360 days     Potassium  Date Value Ref Range Status  03/12/2021 3.1 (L) 3.5 - 5.2 mmol/L Final          Failed - Cr in normal range and within 360 days    Creatinine, Ser  Date Value Ref Range Status  03/12/2021 1.35 (H) 0.76 - 1.27 mg/dL Final          Passed - Ca in normal range and within 360 days    Calcium  Date Value Ref Range Status  03/12/2021 9.7 8.7 - 10.2 mg/dL Final          Passed - Na in normal range and within 360 days    Sodium  Date Value Ref Range Status  03/12/2021 143 134 - 144 mmol/L Final          Passed - Last BP in normal range    BP Readings from Last 1 Encounters:  03/12/21 102/72          Passed - Valid encounter within last 6 months    Recent Outpatient Visits           3 days ago Ischemic cardiomyopathy   [place], MD   2 months ago Cerebrovascular accident (CVA) due to embolism of precerebral artery [facility])   [place] [name], MD   8 months ago Ischemic cardiomyopathy   [place] [name], MD   9 months ago Right hip pain   [place] [name], MD   1 year ago Essential hypertension   [place], MD       Future Appointments             In 2 months [name], MD [place]

## 2021-03-19 ENCOUNTER — Other Ambulatory Visit: Payer: Self-pay

## 2021-03-20 ENCOUNTER — Encounter: Payer: Self-pay | Admitting: Physical Therapy

## 2021-03-20 ENCOUNTER — Ambulatory Visit: Payer: Medicare Other | Admitting: Physical Therapy

## 2021-03-20 ENCOUNTER — Other Ambulatory Visit: Payer: Self-pay

## 2021-03-20 DIAGNOSIS — M79604 Pain in right leg: Secondary | ICD-10-CM

## 2021-03-20 DIAGNOSIS — R262 Difficulty in walking, not elsewhere classified: Secondary | ICD-10-CM | POA: Diagnosis not present

## 2021-03-20 DIAGNOSIS — G8929 Other chronic pain: Secondary | ICD-10-CM | POA: Diagnosis not present

## 2021-03-20 DIAGNOSIS — M6281 Muscle weakness (generalized): Secondary | ICD-10-CM

## 2021-03-20 DIAGNOSIS — M545 Low back pain, unspecified: Secondary | ICD-10-CM | POA: Diagnosis not present
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
## 2021-03-21 DIAGNOSIS — M62830 Muscle spasm of back: Secondary | ICD-10-CM | POA: Diagnosis not present

## 2021-03-21 DIAGNOSIS — M546 Pain in thoracic spine: Secondary | ICD-10-CM | POA: Diagnosis not present

## 2021-03-21 DIAGNOSIS — M9903 Segmental and somatic dysfunction of lumbar region: Secondary | ICD-10-CM | POA: Diagnosis not present

## 2021-03-21 DIAGNOSIS — M542 Cervicalgia: Secondary | ICD-10-CM | POA: Diagnosis not present

## 2021-03-21 DIAGNOSIS — M62838 Other muscle spasm: Secondary | ICD-10-CM | POA: Diagnosis not present

## 2021-03-21 DIAGNOSIS — M9901 Segmental and somatic dysfunction of cervical region: Secondary | ICD-10-CM | POA: Diagnosis not present

## 2021-03-21 DIAGNOSIS — M9902 Segmental and somatic dysfunction of thoracic region: Secondary | ICD-10-CM | POA: Diagnosis not present

## 2021-03-21 DIAGNOSIS — M545 Low back pain, unspecified: Secondary | ICD-10-CM | POA: Diagnosis not present

## 2021-03-25 ENCOUNTER — Ambulatory Visit: Payer: Medicare Other | Admitting: Clinical

## 2021-03-26 ENCOUNTER — Telehealth: Payer: Self-pay | Admitting: Clinical

## 2021-03-26 NOTE — Telephone Encounter (Signed)
Spoke with pt as he missed previous appt. Informed pt that he has an upcoming appt with [facility] for therapy. Encouraged pt to attend this appt and call me if any concerns arise.

## 2021-03-27 ENCOUNTER — Ambulatory Visit: Payer: Medicare Other | Admitting: Physical Therapy

## 2021-04-01 ENCOUNTER — Ambulatory Visit: Payer: Medicare Other | Admitting: Speech Pathology

## 2021-04-03 ENCOUNTER — Other Ambulatory Visit: Payer: Self-pay

## 2021-04-03 ENCOUNTER — Encounter: Payer: Self-pay | Admitting: Physical Therapy

## 2021-04-03 ENCOUNTER — Ambulatory Visit: Payer: Medicare Other | Admitting: Physical Therapy

## 2021-04-03 DIAGNOSIS — R262 Difficulty in walking, not elsewhere classified: Secondary | ICD-10-CM

## 2021-04-03 DIAGNOSIS — M79604 Pain in right leg: Secondary | ICD-10-CM | POA: Diagnosis not present

## 2021-04-03 DIAGNOSIS — M6281 Muscle weakness (generalized): Secondary | ICD-10-CM | POA: Diagnosis not present

## 2021-04-03 DIAGNOSIS — M545 Low back pain, unspecified: Secondary | ICD-10-CM | POA: Diagnosis not present

## 2021-04-03 DIAGNOSIS — G8929 Other chronic pain: Secondary | ICD-10-CM | POA: Diagnosis not present

## 2021-04-03 NOTE — Telephone Encounter (Signed)
Pt called and stated he has an urgent matter / [place] advised him that if his FL2 form is not completed and returned to them by noon today he will lose his housing / Pt stated he gave this to provider two weeks ago / please advise asap

## 2021-04-03 NOTE — Telephone Encounter (Signed)
Call placed to patient with Asante McCoy, LCSW present. Patient provided the name and contact number for his counselor at [facility] # [phone]. Informed him that this CM would contact [facility].  This CM spoke to Ms Shawn Stall, Transitions Coordinator for [facility], [facility].  She explained that she requested an FL2 from the patient's mental health provider a month ago and it was never completed, so she is now requesting an FL2 from PCP  She said that the completed FL2 is needed by 1700 today or the patient will loose the special assistance funding for his housing. If he looses that funding, he will not be able to afford the housing. She said that the form must indicate that he needs domiciliary care and include the NPI number for the clinic. Explained to her that patient's PCP is out of the office today and she said it was acceptable to have another provider sign the FL2. It can be faxed to # [phone].  Dr Margarita Rana agreed to sign the [facility] and it was then faxed to Ms [facility] @ 1600. This CM left voicemail message for Ms Shawn Stall informing her that the form has been faxed to her and if she does not receive it, to please call this CM back # [phone].
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
## 2021-04-05 ENCOUNTER — Ambulatory Visit: Payer: Medicare Other | Attending: Critical Care Medicine | Admitting: Speech Pathology

## 2021-04-11 LAB — COLOGUARD

## 2021-04-15 ENCOUNTER — Other Ambulatory Visit: Payer: Self-pay | Admitting: Critical Care Medicine

## 2021-04-15 ENCOUNTER — Ambulatory Visit (HOSPITAL_COMMUNITY): Payer: Medicaid Other | Admitting: Licensed Clinical Social Worker

## 2021-04-15 DIAGNOSIS — R200 Anesthesia of skin: Secondary | ICD-10-CM

## 2021-04-15 DIAGNOSIS — M792 Neuralgia and neuritis, unspecified: Secondary | ICD-10-CM

## 2021-04-15 NOTE — Telephone Encounter (Signed)
Requested medication (s) are due for refill today: NO  Requested medication (s) are on the active medication list:  Dose inconsistent with current med list, please assess.  Last refill:  ?04/15/21, incorrect dose, new rx does not specify a pharmacy?  Future visit scheduled: 06/2021  Notes to clinic:   Dose inconsistent with current med list, please assess.   Requested Prescriptions  Pending Prescriptions Disp Refills   gabapentin (NEURONTIN) 300 MG capsule [Pharmacy Med Name: GABAPENTIN 300 MG CAPSULE 300 Capsule] 90 capsule 10    Sig: TAKE 1 CAPSULE BY MOUTH 3 TIMES DAILY     Neurology: Anticonvulsants - gabapentin Passed - 04/15/2021  6:21 PM      Passed - Valid encounter within last 12 months    Recent Outpatient Visits           1 month ago Ischemic cardiomyopathy   [place] [name], MD   3 months ago Cerebrovascular accident (CVA) due to embolism of precerebral artery [facility])   [place] [name], MD   9 months ago Ischemic cardiomyopathy   [place] [name], MD   10 months ago Right hip pain   [place] [name], MD   1 year ago Essential hypertension   [place], MD       Future Appointments             In 1 month [name], MD [place]            Signed Prescriptions Disp Refills   atorvastatin (LIPITOR) 80 MG tablet 30 tablet 5    Sig: TAKE 1 TABLET BY MOUTH DAILY     Cardiovascular:  Antilipid - Statins Failed - 04/15/2021  6:21 PM      Failed - LDL in normal range and within 360 days    LDL Calculated  Date Value Ref Range Status  08/24/2018 76 0 - 99 mg/dL Final   LDL Cholesterol  Date Value Ref Range Status  08/27/2020 120 (H) 0 - 99 mg/dL Final    Comment:           Total Cholesterol/HDL:CHD  Risk Coronary Heart Disease Risk Table                     Men   Women  1/2 Average Risk   3.4   3.3  Average Risk       5.0   4.4  2 X Average Risk   9.6   7.1  3 X Average Risk  23.4   11.0        Use the calculated Patient Ratio above and the CHD Risk Table to determine the patient's CHD Risk.        ATP III CLASSIFICATION (LDL):  <100     mg/dL   Optimal  100-129  mg/dL   Near or Above                    Optimal  130-159  mg/dL   Borderline  160-189  mg/dL   High  >190     mg/dL   Very High Performed at [facility] [address], [place], [place] [address]           Failed - HDL in normal  range and within 360 days    HDL  Date Value Ref Range Status  08/27/2020 36 (L) >40 mg/dL Final  08/24/2018 45 >39 mg/dL Final          Passed - Total Cholesterol in normal range and within 360 days    Cholesterol, Total  Date Value Ref Range Status  08/24/2018 133 100 - 199 mg/dL Final   Cholesterol  Date Value Ref Range Status  08/27/2020 168 0 - 200 mg/dL Final          Passed - Triglycerides in normal range and within 360 days    Triglycerides  Date Value Ref Range Status  08/27/2020 59 <150 mg/dL Final          Passed - Patient is not pregnant      Passed - Valid encounter within last 12 months    Recent Outpatient Visits           1 month ago Ischemic cardiomyopathy   [place], MD   3 months ago Cerebrovascular accident (CVA) due to embolism of precerebral artery [facility])   [place] [name], MD   9 months ago Ischemic cardiomyopathy   [place], MD   10 months ago Right hip pain   [place] [name], MD   1 year ago Essential hypertension   [place], MD       Future Appointments             In 1 month [name]  Burnett Harry, MD [place]

## 2021-04-15 NOTE — Telephone Encounter (Signed)
Requested Prescriptions  Pending Prescriptions Disp Refills  . gabapentin (NEURONTIN) 300 MG capsule [Pharmacy Med Name: GABAPENTIN 300 MG CAPSULE 300 Capsule] 90 capsule 10    Sig: TAKE 1 CAPSULE BY MOUTH 3 TIMES DAILY     Neurology: Anticonvulsants - gabapentin Passed - 04/15/2021  6:21 PM      Passed - Valid encounter within last 12 months    Recent Outpatient Visits          1 month ago Ischemic cardiomyopathy   [place], MD   3 months ago Cerebrovascular accident (CVA) due to embolism of precerebral artery [facility])   [place] [name], MD   9 months ago Ischemic cardiomyopathy   [place], MD   10 months ago Right hip pain   [place] [name], MD   1 year ago Essential hypertension   [place], MD      Future Appointments            In 1 month [name] Burnett Harry, MD [place]           . atorvastatin (LIPITOR) 80 MG tablet [Pharmacy Med Name: ATORVASTATIN 80 MG TABLET 80 Tablet] 30 tablet 5    Sig: TAKE 1 TABLET BY MOUTH DAILY     Cardiovascular:  Antilipid - Statins Failed - 04/15/2021  6:21 PM      Failed - LDL in normal range and within 360 days    LDL Calculated  Date Value Ref Range Status  08/24/2018 76 0 - 99 mg/dL Final   LDL Cholesterol  Date Value Ref Range Status  08/27/2020 120 (H) 0 - 99 mg/dL Final    Comment:           Total Cholesterol/HDL:CHD Risk Coronary Heart Disease Risk Table                     Men   Women  1/2 Average Risk   3.4   3.3  Average Risk       5.0   4.4  2 X Average Risk   9.6   7.1  3 X Average Risk  23.4   11.0        Use the calculated Patient Ratio above and the CHD Risk Table to determine the patient's CHD Risk.        ATP III CLASSIFICATION (LDL):  <100      mg/dL   Optimal  100-129  mg/dL   Near or Above                    Optimal  130-159  mg/dL   Borderline  160-189  mg/dL   High  >190     mg/dL   Very High Performed at [facility] [address] [place], [place] [address]          Failed - HDL in normal range and within 360 days    HDL  Date Value Ref Range Status  08/27/2020 36 (L) >40 mg/dL Final  08/24/2018 45 >39 mg/dL Final         Passed - Total Cholesterol in normal range and within 360 days    Cholesterol, Total  Date Value Ref Range Status  08/24/2018 133 100 - 199  mg/dL Final   Cholesterol  Date Value Ref Range Status  08/27/2020 168 0 - 200 mg/dL Final         Passed - Triglycerides in normal range and within 360 days    Triglycerides  Date Value Ref Range Status  08/27/2020 59 <150 mg/dL Final         Passed - Patient is not pregnant      Passed - Valid encounter within last 12 months    Recent Outpatient Visits          1 month ago Ischemic cardiomyopathy   [place], MD   3 months ago Cerebrovascular accident (CVA) due to embolism of precerebral artery [facility])   [place] [name], MD   9 months ago Ischemic cardiomyopathy   [place], MD   10 months ago Right hip pain   [place] [name], MD   1 year ago Essential hypertension   [place], MD      Future Appointments            In 1 month [name] Burnett Harry, MD [place]

## 2021-05-08 ENCOUNTER — Other Ambulatory Visit: Payer: Self-pay
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
## 2021-05-10 ENCOUNTER — Telehealth (HOSPITAL_COMMUNITY): Payer: Self-pay

## 2021-05-10 NOTE — Telephone Encounter (Signed)
Called to confirm/remind patient of their appointment at the [facility] on 05/13/21.   Patient reminded to bring all medications and/or complete list.  Confirmed patient has transportation. Gave directions, instructed to utilize [place] parking.  Confirmed appointment prior to ending call.

## 2021-05-13 ENCOUNTER — Telehealth: Payer: Self-pay

## 2021-05-13 ENCOUNTER — Encounter (HOSPITAL_COMMUNITY): Payer: Commercial Managed Care - HMO
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
## 2021-05-13 NOTE — Telephone Encounter (Signed)
Contacted pt to schedule Medicare Wellness pt didn't answer and was unable to lvm

## 2021-05-14 ENCOUNTER — Ambulatory Visit (HOSPITAL_COMMUNITY): Payer: Self-pay | Admitting: Licensed Clinical Social Worker

## 2021-05-15 ENCOUNTER — Telehealth (HOSPITAL_COMMUNITY): Payer: Self-pay

## 2021-05-15 NOTE — Telephone Encounter (Signed)
Called to confirm/remind patient of their appointment at the [facility] on 05/16/21.   Patient also needs his [facility] transportation arranged and Roslyn was notified and will check and give patient a call to confirm.  Patient was also reminded to bring all medications and/or complete list.  Confirmed patient has transportation. Gave directions, instructed to utilize [place] parking.  Confirmed appointment prior to ending call.

## 2021-05-16 ENCOUNTER — Ambulatory Visit (HOSPITAL_COMMUNITY)
Admission: RE | Admit: 2021-05-16 | Discharge: 2021-05-16 | Disposition: A | Discharge status: Home / Self Care | Payer: Medicare Other | Source: Ambulatory Visit | Attending: Physician Assistant | Admitting: Physician Assistant

## 2021-05-16 ENCOUNTER — Other Ambulatory Visit: Payer: Self-pay

## 2021-05-16 ENCOUNTER — Encounter (HOSPITAL_COMMUNITY): Payer: Self-pay

## 2021-05-16 ENCOUNTER — Encounter (HOSPITAL_BASED_OUTPATIENT_CLINIC_OR_DEPARTMENT_OTHER): Payer: Commercial Managed Care - HMO | Admitting: Cardiology

## 2021-05-16 VITALS — BP 110/98 | HR 94 | Wt 238.0 lb

## 2021-05-16 DIAGNOSIS — G4733 Obstructive sleep apnea (adult) (pediatric): Secondary | ICD-10-CM

## 2021-05-16 DIAGNOSIS — I2510 Atherosclerotic heart disease of native coronary artery without angina pectoris: Secondary | ICD-10-CM | POA: Diagnosis not present

## 2021-05-16 DIAGNOSIS — Z79899 Other long term (current) drug therapy: Secondary | ICD-10-CM | POA: Insufficient documentation

## 2021-05-16 DIAGNOSIS — G4734 Idiopathic sleep related nonobstructive alveolar hypoventilation: Secondary | ICD-10-CM

## 2021-05-16 DIAGNOSIS — Z7901 Long term (current) use of anticoagulants: Secondary | ICD-10-CM | POA: Insufficient documentation

## 2021-05-16 DIAGNOSIS — I5082 Biventricular heart failure: Secondary | ICD-10-CM | POA: Diagnosis not present

## 2021-05-16 DIAGNOSIS — I4892 Unspecified atrial flutter: Secondary | ICD-10-CM

## 2021-05-16 DIAGNOSIS — Z8249 Family history of ischemic heart disease and other diseases of the circulatory system: Secondary | ICD-10-CM | POA: Insufficient documentation

## 2021-05-16 DIAGNOSIS — N182 Chronic kidney disease, stage 2 (mild): Secondary | ICD-10-CM

## 2021-05-16 DIAGNOSIS — G4731 Primary central sleep apnea: Secondary | ICD-10-CM

## 2021-05-16 DIAGNOSIS — I340 Nonrheumatic mitral (valve) insufficiency: Secondary | ICD-10-CM | POA: Diagnosis not present

## 2021-05-16 DIAGNOSIS — I252 Old myocardial infarction: Secondary | ICD-10-CM | POA: Diagnosis not present

## 2021-05-16 DIAGNOSIS — N189 Chronic kidney disease, unspecified: Secondary | ICD-10-CM | POA: Insufficient documentation

## 2021-05-16 LAB — COMPREHENSIVE METABOLIC PANEL
ALT: 20 U/L (ref 0–44)
AST: 30 U/L (ref 15–41)
Albumin: 4 g/dL (ref 3.5–5.0)
Alkaline Phosphatase: 98 U/L (ref 38–126)
Anion gap: 13 (ref 5–15)
BUN: 14 mg/dL (ref 6–20)
CO2: 28 mmol/L (ref 22–32)
Calcium: 9.4 mg/dL (ref 8.9–10.3)
Chloride: 99 mmol/L (ref 98–111)
Creatinine, Ser: 1.25 mg/dL — ABNORMAL HIGH (ref 0.61–1.24)
GFR, Estimated: >60 mL/min (ref >60)
Glucose, Bld: 97 mg/dL (ref 70–99)
Potassium: 2.7 mmol/L — CL (ref 3.5–5.1)
Sodium: 140 mmol/L (ref 135–145)
Total Bilirubin: 2 mg/dL — ABNORMAL HIGH (ref 0.3–1.2)
Total Protein: 7.3 g/dL (ref 6.5–8.1)

## 2021-05-16 LAB — CBC
HCT: 41.9 % (ref 39.0–52.0)
Hemoglobin: 14.3 g/dL (ref 13.0–17.0)
MCH: 30 pg (ref 26.0–34.0)
MCHC: 34.1 g/dL (ref 30.0–36.0)
MCV: 87.8 fL (ref 80.0–100.0)
Platelets: 280 10*3/uL (ref 150–400)
RBC: 4.77 MIL/uL (ref 4.22–5.81)
RDW: 14.6 % (ref 11.5–15.5)
WBC: 4.9 10*3/uL (ref 4.0–10.5)
nRBC: 0 % (ref 0.0–0.2)

## 2021-05-16 LAB — BRAIN NATRIURETIC PEPTIDE: B Natriuretic Peptide: 287.9 pg/mL — ABNORMAL HIGH (ref 0.0–100.0)

## 2021-05-16 LAB — TSH: TSH: 3.279 u[IU]/mL (ref 0.350–4.500)

## 2021-05-16 MED ORDER — METOPROLOL SUCCINATE ER 50 MG PO TB24: 50.0000 mg | ORAL_TABLET | Freq: Every day | ORAL | 3 refills | Status: DC

## 2021-05-16 NOTE — Progress Notes (Signed)
Patient Name:  Cody Guzman        DOB: 04/11/1966      Height: 5'8:"     Weight: 238 lb  Office Name: [facility]         Referring Provider: Glori Bickers, MD  Today's Date: 05/16/2021  STOP BANG RISK ASSESSMENT S (snore) Have you been told that you snore?     YES   T (tired) Are you often tired, fatigued, or sleepy during the day?   YES  O (obstruction) Do you stop breathing, choke, or gasp during sleep? YES   P (pressure) Do you have or are you being treated for high blood pressure? YES   B (BMI) Is your body index greater than 35 kg/m? YES   A (age) Are you 34 years old or older? YES   N (neck) Do you have a neck circumference greater than 16 inches?   NO   G (gender) Are you a male? YES   TOTAL STOP/BANG YES ANSWERS                                                                        For Office Use Only              Procedure Order Form    YES to 3+ Stop Bang questions OR two clinical symptoms - patient qualifies for WatchPAT (CPT 36122)             Clinical Notes: Will consult Sleep Specialist and refer for management of therapy due to patient increased risk of Sleep Apnea. Ordering a sleep study due to the following two clinical symptoms:  / Loud snoring R06.83  Unrefreshed by sleep G47.8 / Impotence N52.9 / History of high blood pressure R03.0   I understand that I am proceeding with a home sleep apnea test as ordered by my treating physician. I understand that untreated sleep apnea is a serious cardiovascular risk factor and it is my responsibility to perform the test and seek management for sleep apnea. I will be contacted with the results and be managed for sleep apnea by a local sleep physician. I will be receiving equipment and further instructions from [facility] I shall promptly ship back the equipment via the included mailing label. I understand my insurance will be billed for the test and as the patient I am responsible for any  insurance related out-of-pocket costs incurred. I have been provided with written instructions and can call for additional video or telephonic instruction, with 24-hour availability of qualified personnel to answer any questions: Patient Help Desk [phone].  Patient Signature ______________________________________________________   Date______________________ Patient Telemedicine Verbal Consent

## 2021-05-16 NOTE — Progress Notes (Signed)
ReDS Vest / Clip - 05/16/21 1200       ReDS Vest / Clip   Station Marker D    Ruler Value 35    ReDS Value Range Moderate volume overload    ReDS Actual Value 40

## 2021-05-16 NOTE — Patient Instructions (Signed)
INCREASE Metoprolol to 50 mg, one tab daily  Please take an additional 20 mg of torsemide and 20 meq of potassium for 2-3 days then resume normal dose  Labs today We will only contact you if something comes back abnormal or we need to make some changes. Otherwise no news is good news!  You have been referred to [facility] -they will be in contact with an appointment  Your physician recommends that you schedule a follow-up appointment in: 2-3 weeks with the pharmacy team and in 3 months with Dr Cody Guzman with echo  Your physician has requested that you have an echocardiogram. Echocardiography is a painless test that uses sound waves to create images of your heart. It provides your doctor with information about the size and shape of your heart and how well your hearts chambers and valves are working. This procedure takes approximately one hour. There are no restrictions for this procedure.   Do the following things EVERYDAY: Weigh yourself in the morning before breakfast. Write it down and keep it in a log. Take your medicines as prescribed Eat low salt foods--Limit salt (sodium) to 2000 mg per day.  Stay as active as you can everyday Limit all fluids for the day to less than 2 liters  At the [facility], you and your health needs are our priority. As part of our continuing mission to provide you with exceptional heart care, we have created designated Provider Care Teams. These Care Teams include your primary Cardiologist (physician) and Advanced Practice Providers (APPs- Physician Assistants and Nurse Practitioners) who all work together to provide you with the care you need, when you need it.   You may see any of the following providers on your designated Care Team at your next follow up: Dr Glori Bickers Dr Haynes Kerns, NP Lyda Jester, [place] [facility] [place], [place] Audry Riles, PharmD   Please be sure to bring in all  your medications bottles to every appointment.   If you have any questions or concerns before your next appointment please send [place] a message through [place] or call our office at [phone].    TO LEAVE A MESSAGE FOR THE NURSE SELECT OPTION 2, PLEASE LEAVE A MESSAGE INCLUDING: YOUR NAME DATE OF BIRTH CALL BACK NUMBER REASON FOR CALL**this is important as we prioritize the call backs  YOU WILL RECEIVE A CALL BACK THE SAME DAY AS LONG AS YOU CALL BEFORE 4:00 PM

## 2021-05-17 ENCOUNTER — Telehealth (HOSPITAL_COMMUNITY): Payer: Self-pay | Admitting: Physician Assistant

## 2021-05-17 MED ORDER — POTASSIUM CHLORIDE CRYS ER 20 MEQ PO TBCR: EXTENDED_RELEASE_TABLET | ORAL | 3 refills | Status: DC

## 2021-05-17 NOTE — Telephone Encounter (Signed)
I was able to reach patient to review his lab results. K 2.7. He has not been taking potassium supplement.   Prescribed potassium chloride 40 mg BID today then 40 mg daily starting tomorrow.   Needs BMET on Monday. Please arrange lab and cone transport

## 2021-05-17 NOTE — Telephone Encounter (Signed)
Lab appt scheduled.

## 2021-05-17 NOTE — Procedures (Signed)
° °  Sleep Study Report  Patient Information Study Date: 05/16/21 Patient Name: Cody Guzman Patient ID: [id number] Birth Date: 06/10/2065 Age: 56 Gender: Male Referring Physician:Daniel Bensimhon, MD  TEST DESCRIPTION: Home sleep apnea testing was completed using the WatchPat, a Type 1 device, utilizing peripheral arterial tonometry (PAT), chest movement, actigraphy, pulse oximetry, pulse rate, body position and snore. AHI was calculated with apnea and hypopnea using valid sleep time as the denominator. RDI includes apneas, hypopneas, and RERAs. The data acquired and the scoring of sleep and all associated events were performed in accordance with the recommended standards and specifications as outlined in the AASM Manual for the Scoring of Sleep and Associated Events 2.2.0 (2015).  FINDINGS: 1. Severe Obstructive Sleep Apnea with AHI 41.5/hr. 2. Moderate Central Sleep Apnea with pAHIc 20.9/hr. 3. Oxygen desaturations as low as 75%. 4. Moderate snoring was present. O2 sats were < 88% for 24.4 min. 5. Total sleep time was 4 hrs and 14 min. 6. 0% of total sleep time was spent in REM sleep. 7. Shortened sleep onset latency at 5 min. 8. No REM sleep was present. 9. Total awakenings were 25.  DIAGNOSIS: Severe Obstructive Sleep Apnea (G47.33) Moderate Central Sleep Apnea Nocturnal Hypoxemia  RECOMMENDATIONS: 1. Clinical correlation of these findings is necessary. The decision to treat obstructive sleep apnea (OSA) is usually based on the presence of apnea symptoms or the presence of associated medical conditions such as Hypertension, Congestive Heart Failure, Atrial Fibrillation or Obesity. The most common symptoms of OSA are snoring, gasping for breath while sleeping, daytime sleepiness and fatigue.  2. Initiating apnea therapy is recommended given the presence of symptoms and/or associated conditions. Recommend proceeding with one of the following:   a. Auto-CPAP therapy  with a pressure range of 5-20cm H2O.   b. An oral appliance (OA) that can be obtained from certain dentists with expertise in sleep medicine. These are primarily of use in non-obese patients with mild and moderate disease.   c. An ENT consultation which may be useful to look for specific causes of obstruction and possible treatment options.   d. If patient is intolerant to PAP therapy, consider referral to ENT for evaluation for hypoglossal nerve stimulator.  3. Close follow-up is necessary to ensure success with CPAP or oral appliance therapy for maximum benefit .  4. A follow-up oximetry study on CPAP is recommended to assess the adequacy of therapy and determine the need for supplemental oxygen or the potential need for Bi-level therapy. An arterial blood gas to determine the adequacy of baseline ventilation and oxygenation should also be considered.  5. Healthy sleep recommendations include: adequate nightly sleep (normal 7-9 hrs/night), avoidance of caffeine after noon and alcohol near bedtime, and maintaining a sleep environment that is cool, dark and quiet.  6. Weight loss for overweight patients is recommended. Even modest amounts of weight loss can significantly improve the severity of sleep apnea.  7. Snoring recommendations include: weight loss where appropriate, side sleeping, and avoidance of alcohol before bed.  8. Operation of motor vehicle should be avoided when sleepy.  Signature: Electronically Signed: 05/17/21 Fransico Him, MD; [facility]; [place], American Board of Sleep Medicine

## 2021-05-20 ENCOUNTER — Other Ambulatory Visit (HOSPITAL_COMMUNITY): Payer: Commercial Managed Care - HMO

## 2021-05-20 ENCOUNTER — Ambulatory Visit: Payer: Commercial Managed Care - HMO

## 2021-05-20 ENCOUNTER — Other Ambulatory Visit: Payer: Self-pay | Admitting: Critical Care Medicine

## 2021-05-20 DIAGNOSIS — G4733 Obstructive sleep apnea (adult) (pediatric): Secondary | ICD-10-CM

## 2021-05-20 DIAGNOSIS — I10 Essential (primary) hypertension: Secondary | ICD-10-CM

## 2021-05-20 DIAGNOSIS — I509 Heart failure, unspecified: Secondary | ICD-10-CM

## 2021-05-20 NOTE — Telephone Encounter (Signed)
Medications were refilled 03/12/2021 with refills that should last until 08/22/21. Aldactone 25 #30 with 4 refills. Coreg 6.25 #60 with 4 refills.   Requested Prescriptions  Pending Prescriptions Disp Refills   spironolactone (ALDACTONE) 25 MG tablet [Pharmacy Med Name: SPIRONOLACTONE 25 MG TABS 25 Tablet] 15 tablet 10    Sig: TAKE 1/2 TABLET BY MOUTH DAILY     Cardiovascular: Diuretics - Aldosterone Antagonist Failed - 05/20/2021  6:50 PM      Failed - Cr in normal range and within 360 days    Creatinine, Ser  Date Value Ref Range Status  05/16/2021 1.25 (H) 0.61 - 1.24 mg/dL Final         Failed - K in normal range and within 360 days    Potassium  Date Value Ref Range Status  05/16/2021 2.7 (LL) 3.5 - 5.1 mmol/L Final    Comment:    CRITICAL RESULT CALLED TO, READ BACK BY AND VERIFIED WITH: E.CARLOUS,RN 05/16/2021  1356 DAVISB          Failed - Last BP in normal range    BP Readings from Last 1 Encounters:  05/16/21 (!) 110/98         Passed - Na in normal range and within 360 days    Sodium  Date Value Ref Range Status  05/16/2021 140 135 - 145 mmol/L Final  03/12/2021 143 134 - 144 mmol/L Final         Passed - Valid encounter within last 6 months    Recent Outpatient Visits          2 months ago Ischemic cardiomyopathy   [place], Patrick E, MD   4 months ago Cerebrovascular accident (CVA) due to embolism of precerebral artery [facility])   [place] [name], MD   10 months ago Ischemic cardiomyopathy   [place] [name], MD   11 months ago Right hip pain   [place] [name], MD   1 year ago Essential hypertension   [place], MD      Future Appointments            In 3 weeks [name], MD [place]             carvedilol (COREG) 6.25 MG tablet [Pharmacy Med Name: CARVEDILOL 6.25 MG TABS 6.25 Tablet] 60 tablet 10    Sig: TAKE 1 TABLET BY MOUTH TWICE DAILY WITH MEALS     Cardiovascular:  Beta Blockers Failed - 05/20/2021  6:50 PM      Failed - Last BP in normal range    BP Readings from Last 1 Encounters:  05/16/21 (!) 110/98         Passed - Last Heart Rate in normal range    Pulse Readings from Last 1 Encounters:  05/16/21 94         Passed - Valid encounter within last 6 months    Recent Outpatient Visits          2 months ago Ischemic cardiomyopathy   [place], MD   4 months ago Cerebrovascular accident (CVA) due to embolism of precerebral artery [facility])   [place] [name], MD   10 months ago Ischemic cardiomyopathy  [place] [name], MD   11 months ago Right hip pain   [place] [name], MD   1 year ago Essential hypertension   [place], MD      Future Appointments            In 3 weeks [name], MD [place]

## 2021-05-21 ENCOUNTER — Other Ambulatory Visit: Payer: Self-pay

## 2021-05-21 DIAGNOSIS — G4733 Obstructive sleep apnea (adult) (pediatric): Secondary | ICD-10-CM

## 2021-05-24 ENCOUNTER — Other Ambulatory Visit: Payer: Self-pay | Admitting: Critical Care Medicine

## 2021-05-24 ENCOUNTER — Other Ambulatory Visit (HOSPITAL_COMMUNITY): Payer: Self-pay

## 2021-05-24 DIAGNOSIS — I509 Heart failure, unspecified: Secondary | ICD-10-CM

## 2021-05-24 DIAGNOSIS — I10 Essential (primary) hypertension: Secondary | ICD-10-CM
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
## 2021-05-26 ENCOUNTER — Other Ambulatory Visit: Payer: Self-pay | Admitting: Critical Care Medicine

## 2021-05-27 NOTE — Telephone Encounter (Signed)
Requested medication (s) are due for refill today:   Requested medication (s) are on the active medication list: yes    Last refill: 03/12/21 #60  4 refills  Future visit scheduled yes 06/10/21  Notes to clinic:  Spoke with pharmacy to clarify refill status. States last refill was 05/20/21  #60. Do not see refill noted. Please review. Thank you.  Requested Prescriptions  Pending Prescriptions Disp Refills   carvedilol (COREG) 6.25 MG tablet [Pharmacy Med Name: CARVEDILOL 6.25 MG TABS 6.25 Tablet] 60 tablet 10    Sig: TAKE 1 TABLET BY MOUTH TWICE DAILY WITH MEALS     Cardiovascular:  Beta Blockers Failed - 05/26/2021  8:02 PM      Failed - Last BP in normal range    BP Readings from Last 1 Encounters:  05/16/21 (!) 110/98          Passed - Last Heart Rate in normal range    Pulse Readings from Last 1 Encounters:  05/16/21 94          Passed - Valid encounter within last 6 months    Recent Outpatient Visits           2 months ago Ischemic cardiomyopathy   [place], MD   4 months ago Cerebrovascular accident (CVA) due to embolism of precerebral artery [facility])   [place] [name], MD   10 months ago Ischemic cardiomyopathy   [place] [name], MD   11 months ago Right hip pain   [place] [name], MD   1 year ago Essential hypertension   [place], MD       Future Appointments             In 2 weeks [name], MD [place]

## 2021-05-30 ENCOUNTER — Telehealth: Payer: Self-pay | Admitting: *Deleted

## 2021-05-30 NOTE — Telephone Encounter (Signed)
Call would not go through. Will send a mychart message to ask patient to return a call to me.

## 2021-05-30 NOTE — Telephone Encounter (Signed)
-----   Message from Freada Bergeron, [place] sent at 05/22/2021  6:20 PM EST ----- Regarding: may be dupicate  ----- Message ----- From: Sueanne Margarita, MD Sent: 05/17/2021  10:54 AM EST To: Freada Bergeron, CMA  Please let patient know that they have sleep apnea.  Recommend therapeutic CPAP titration for treatment of patient's sleep disordered breathing.  If unable to perform an in lab titration then initiate ResMed auto CPAP from 4 to 15cm H2O with heated humidity and mask of choice and overnight pulse ox on CPAP.

## 2021-06-03 ENCOUNTER — Encounter: Payer: Self-pay | Admitting: Internal Medicine

## 2021-06-04 ENCOUNTER — Inpatient Hospital Stay (HOSPITAL_COMMUNITY): Admission: RE | Admit: 2021-06-04 | Payer: Commercial Managed Care - HMO | Source: Ambulatory Visit

## 2021-06-05 ENCOUNTER — Telehealth: Payer: Self-pay | Admitting: *Deleted

## 2021-06-05 NOTE — Telephone Encounter (Signed)
-----   Message from Freada Bergeron, [place] sent at 05/22/2021  6:20 PM EST ----- Regarding: may be dupicate  ----- Message ----- From: Sueanne Margarita, MD Sent: 05/17/2021  10:54 AM EST To: Freada Bergeron, CMA  Please let patient know that they have sleep apnea.  Recommend therapeutic CPAP titration for treatment of patient's sleep disordered breathing.  If unable to perform an in lab titration then initiate ResMed auto CPAP from 4 to 15cm H2O with heated humidity and mask of choice and overnight pulse ox on CPAP.

## 2021-06-05 NOTE — Telephone Encounter (Signed)
Attempted to call patient to advise of sleep study results and recommendations. No answer or machine. This is my second attempt as well a mychart message being sent.

## 2021-06-10 ENCOUNTER — Ambulatory Visit: Payer: Medicare Other | Attending: Critical Care Medicine | Admitting: Critical Care Medicine

## 2021-06-10 ENCOUNTER — Encounter: Payer: Self-pay | Admitting: Critical Care Medicine

## 2021-06-10 VITALS — BP 100/70 | HR 93 | Resp 16 | Wt 232.4 lb

## 2021-06-10 DIAGNOSIS — I25119 Atherosclerotic heart disease of native coronary artery with unspecified angina pectoris: Secondary | ICD-10-CM | POA: Diagnosis not present

## 2021-06-10 DIAGNOSIS — I509 Heart failure, unspecified: Secondary | ICD-10-CM

## 2021-06-10 DIAGNOSIS — F17210 Nicotine dependence, cigarettes, uncomplicated: Secondary | ICD-10-CM

## 2021-06-10 DIAGNOSIS — R200 Anesthesia of skin: Secondary | ICD-10-CM

## 2021-06-10 DIAGNOSIS — F319 Bipolar disorder, unspecified: Secondary | ICD-10-CM | POA: Diagnosis not present

## 2021-06-10 DIAGNOSIS — N182 Chronic kidney disease, stage 2 (mild): Secondary | ICD-10-CM | POA: Diagnosis not present

## 2021-06-10 DIAGNOSIS — C8590 Non-Hodgkin lymphoma, unspecified, unspecified site: Secondary | ICD-10-CM

## 2021-06-10 DIAGNOSIS — M792 Neuralgia and neuritis, unspecified: Secondary | ICD-10-CM | POA: Diagnosis not present

## 2021-06-10 DIAGNOSIS — F32A Depression, unspecified: Secondary | ICD-10-CM | POA: Diagnosis not present

## 2021-06-10 DIAGNOSIS — G4733 Obstructive sleep apnea (adult) (pediatric): Secondary | ICD-10-CM | POA: Diagnosis not present

## 2021-06-10 DIAGNOSIS — I10 Essential (primary) hypertension: Secondary | ICD-10-CM | POA: Diagnosis not present

## 2021-06-10 MED ORDER — COLCHICINE 0.6 MG PO TABS: 0.6000 mg | ORAL_TABLET | ORAL | 10 refills | Status: DC

## 2021-06-10 MED ORDER — QUETIAPINE FUMARATE 100 MG PO TABS: 100.0000 mg | ORAL_TABLET | Freq: Every day | ORAL | 10 refills | Status: DC

## 2021-06-10 MED ORDER — SPIRONOLACTONE 25 MG PO TABS: 12.5000 mg | ORAL_TABLET | Freq: Every day | ORAL | 10 refills | Status: DC

## 2021-06-10 MED ORDER — GABAPENTIN 300 MG PO CAPS: 300.0000 mg | ORAL_CAPSULE | Freq: Three times a day (TID) | ORAL | 10 refills | Status: DC

## 2021-06-10 MED ORDER — TORSEMIDE 20 MG PO TABS: 20.0000 mg | ORAL_TABLET | Freq: Every day | ORAL | 5 refills | Status: DC

## 2021-06-10 MED ORDER — ISOSORB DINITRATE-HYDRALAZINE 20-37.5 MG PO TABS: 1.0000 | ORAL_TABLET | Freq: Three times a day (TID) | ORAL | 10 refills | Status: DC

## 2021-06-10 MED ORDER — TAMSULOSIN HCL 0.4 MG PO CAPS: 0.4000 mg | ORAL_CAPSULE | Freq: Every day | ORAL | 10 refills | Status: DC

## 2021-06-10 MED ORDER — CARVEDILOL 6.25 MG PO TABS: 6.2500 mg | ORAL_TABLET | Freq: Two times a day (BID) | ORAL | 4 refills | Status: DC

## 2021-06-10 MED ORDER — AMIODARONE HCL 200 MG PO TABS: 200.0000 mg | ORAL_TABLET | Freq: Every day | ORAL | 11 refills | Status: DC

## 2021-06-10 MED ORDER — ATORVASTATIN CALCIUM 80 MG PO TABS: 80.0000 mg | ORAL_TABLET | Freq: Every day | ORAL | 5 refills | Status: DC

## 2021-06-10 MED ORDER — ALLOPURINOL 100 MG PO TABS: 100.0000 mg | ORAL_TABLET | Freq: Every day | ORAL | 10 refills | Status: DC

## 2021-06-10 MED ORDER — APIXABAN 5 MG PO TABS: 5.0000 mg | ORAL_TABLET | Freq: Two times a day (BID) | ORAL | 4 refills | Status: DC

## 2021-06-10 MED ORDER — IRBESARTAN 75 MG PO TABS: 37.5000 mg | ORAL_TABLET | Freq: Every day | ORAL | 4 refills | Status: DC
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
## 2021-06-10 NOTE — Patient Instructions (Addendum)
Call the psychiatry office for appt  [facility] will call you for the cpap machine  All medications sent to [facility] mail order,  your potassium was sent to [place] by cardiology  No medication changes  A cane was issued   Stop by the lab for lab draws  Return Dr [name] 4 months

## 2021-06-11 ENCOUNTER — Telehealth: Payer: Self-pay | Admitting: Critical Care Medicine

## 2021-06-11 ENCOUNTER — Telehealth: Payer: Self-pay

## 2021-06-11 ENCOUNTER — Telehealth: Payer: Self-pay | Admitting: *Deleted

## 2021-06-11 ENCOUNTER — Ambulatory Visit: Payer: Medicare Other | Admitting: Critical Care Medicine

## 2021-06-11 DIAGNOSIS — G4733 Obstructive sleep apnea (adult) (pediatric): Secondary | ICD-10-CM

## 2021-06-11 LAB — CBC WITH DIFFERENTIAL/PLATELET
Basophils Absolute: 0.1 10*3/uL (ref 0.0–0.2)
Basos: 1 %
EOS (ABSOLUTE): 0.2 10*3/uL (ref 0.0–0.4)
Eos: 3 %
Hematocrit: 44.7 % (ref 37.5–51.0)
Hemoglobin: 15.1 g/dL (ref 13.0–17.7)
Immature Grans (Abs): 0 10*3/uL (ref 0.0–0.1)
Immature Granulocytes: 0 %
Lymphocytes Absolute: 2.1 10*3/uL (ref 0.7–3.1)
Lymphs: 35 %
MCH: 29.9 pg (ref 26.6–33.0)
MCHC: 33.8 g/dL (ref 31.5–35.7)
MCV: 89 fL (ref 79–97)
Monocytes Absolute: 0.5 10*3/uL (ref 0.1–0.9)
Monocytes: 8 %
Neutrophils Absolute: 3.1 10*3/uL (ref 1.4–7.0)
Neutrophils: 53 %
Platelets: 255 10*3/uL (ref 150–450)
RBC: 5.05 x10E6/uL (ref 4.14–5.80)
RDW: 14.1 % (ref 11.6–15.4)
WBC: 5.8 10*3/uL (ref 3.4–10.8)

## 2021-06-11 LAB — COMPREHENSIVE METABOLIC PANEL
ALT: 22 IU/L (ref 0–44)
AST: 23 IU/L (ref 0–40)
Albumin/Globulin Ratio: 1.6 (ref 1.2–2.2)
Albumin: 4.8 g/dL (ref 3.8–4.9)
Alkaline Phosphatase: 119 IU/L (ref 44–121)
BUN/Creatinine Ratio: 8 — ABNORMAL LOW (ref 9–20)
BUN: 12 mg/dL (ref 6–24)
Bilirubin Total: 2 mg/dL — ABNORMAL HIGH (ref 0.0–1.2)
CO2: 20 mmol/L (ref 20–29)
Calcium: 10.7 mg/dL — ABNORMAL HIGH (ref 8.7–10.2)
Chloride: 103 mmol/L (ref 96–106)
Creatinine, Ser: 1.46 mg/dL — ABNORMAL HIGH (ref 0.76–1.27)
Globulin, Total: 3 g/dL (ref 1.5–4.5)
Glucose: 99 mg/dL (ref 70–99)
Potassium: 3.9 mmol/L (ref 3.5–5.2)
Sodium: 140 mmol/L (ref 134–144)
Total Protein: 7.8 g/dL (ref 6.0–8.5)
eGFR: 56 mL/min/{1.73_m2} — ABNORMAL LOW (ref >59)

## 2021-06-11 NOTE — Assessment & Plan Note (Signed)
Severe sleep apnea patient needs CPAP immediately I called cardiology they will set this up they have been trying to get a hold of the patient could not reach him [facility] was chosen as supplier

## 2021-06-11 NOTE — Assessment & Plan Note (Signed)
Patient has connected with behavioral health counselors will need follow-up

## 2021-06-11 NOTE — Telephone Encounter (Signed)
-----   Message from [name], MD sent at 06/11/2021  9:36 AM EST ----- Let pt know labs all look good  no changes to medications.  Blood counts , kidneys normal

## 2021-06-11 NOTE — Assessment & Plan Note (Signed)
Heart unstated at this time  Refills on all medications given and was sent to the mail order pharmacy  No change in dosing

## 2021-06-11 NOTE — Assessment & Plan Note (Signed)
° ° °•   Current smoking consumption amount: Less than 5 cigarettes daily  Dicsussion on advise to quit smoking and smoking impacts: Cardiovascular impacts  Patient's willingness to quit: Willing to quit   Methods to quit smoking discussed: Behavioral modification   Medication management of smoking session drugs discussed: Not indicated   Resources provided:  AVS    Setting quit date not established   Follow-up arranged 2 months   Time spent counseling the patient: 5 minutes

## 2021-06-11 NOTE — Assessment & Plan Note (Signed)
Blood pressure at goal at this time

## 2021-06-11 NOTE — Assessment & Plan Note (Addendum)
The patient has some depression still he is not suicidal he will need medication management  The patient had a referral in November to a local private psychiatry office as he does have Medicare Medicaid.  However he never made this appointment.  He says nobody ever called him but there is evidence he does not answer his phone often.  I gave the patient the name of the practice and phone number asking to call them and I will ask our clinical social worker to follow-up with him as well
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
## 2021-06-11 NOTE — Telephone Encounter (Signed)
Pt was called and no vm was left due to mailbox being full. Information has been sent to nurse pool.  

## 2021-06-11 NOTE — Telephone Encounter (Signed)
Spoke with Dr Dr Asencion Noble yesterday. He had the patient on speaker with him in his office. He recommends that the patient gets a APAP  instead of CPAP titration. He went over the results with the patient and explained everything to him in reference to his sleep study. I had previously attempted to contact the patient twice to discuss the findings. Dr [name] was informed that APAP order would be sent as " urgent" to [facility] today.

## 2021-06-11 NOTE — Assessment & Plan Note (Signed)
We will monitor 

## 2021-06-24 NOTE — Addendum Note (Signed)
Addended by: [name] on: 06/24/2021 08:24 AM   Modules accepted: Orders

## 2021-06-24 NOTE — Telephone Encounter (Signed)
Order for foam wedge faxed to [place] [facility]

## 2021-06-24 NOTE — Telephone Encounter (Signed)
Dme order for foam wedge sent

## 2021-06-25 ENCOUNTER — Encounter (HOSPITAL_COMMUNITY): Payer: Self-pay

## 2021-06-25 ENCOUNTER — Inpatient Hospital Stay (HOSPITAL_COMMUNITY)
Admission: RE | Admit: 2021-06-25 | Discharge: 2021-06-25 | Disposition: A | Discharge status: Home / Self Care | Payer: Medicare Other | Source: Ambulatory Visit

## 2021-06-28 ENCOUNTER — Encounter (HOSPITAL_COMMUNITY): Payer: Self-pay | Admitting: Emergency Medicine

## 2021-06-28 ENCOUNTER — Emergency Department (HOSPITAL_COMMUNITY)
Admission: EM | Admit: 2021-06-28 | Discharge: 2021-06-29 | Disposition: A | Discharge status: Home / Self Care | Payer: Medicare Other | Attending: Emergency Medicine | Admitting: Emergency Medicine

## 2021-06-28 ENCOUNTER — Emergency Department (HOSPITAL_COMMUNITY): Payer: Medicare Other

## 2021-06-28 DIAGNOSIS — S61212A Laceration without foreign body of right middle finger without damage to nail, initial encounter: Secondary | ICD-10-CM | POA: Insufficient documentation

## 2021-06-28 DIAGNOSIS — Z7901 Long term (current) use of anticoagulants: Secondary | ICD-10-CM | POA: Insufficient documentation

## 2021-06-28 DIAGNOSIS — S6991XA Unspecified injury of right wrist, hand and finger(s), initial encounter: Secondary | ICD-10-CM | POA: Diagnosis present

## 2021-06-28 DIAGNOSIS — W268XXA Contact with other sharp object(s), not elsewhere classified, initial encounter: Secondary | ICD-10-CM | POA: Diagnosis not present

## 2021-06-28 DIAGNOSIS — Z23 Encounter for immunization: Secondary | ICD-10-CM | POA: Insufficient documentation

## 2021-06-28 LAB — I-STAT CHEM 8, ED
BUN: 14 mg/dL (ref 6–20)
Calcium, Ion: 1.09 mmol/L — ABNORMAL LOW (ref 1.15–1.40)
Chloride: 105 mmol/L (ref 98–111)
Creatinine, Ser: 1.3 mg/dL — ABNORMAL HIGH (ref 0.61–1.24)
Glucose, Bld: 90 mg/dL (ref 70–99)
HCT: 43 % (ref 39.0–52.0)
Hemoglobin: 14.6 g/dL (ref 13.0–17.0)
Potassium: 3.5 mmol/L (ref 3.5–5.1)
Sodium: 140 mmol/L (ref 135–145)
TCO2: 24 mmol/L (ref 22–32)

## 2021-06-28 MED ORDER — TETANUS-DIPHTH-ACELL PERTUSSIS 5-2.5-18.5 LF-MCG/0.5 IM SUSY
0.5000 mL | PREFILLED_SYRINGE | Freq: Once | INTRAMUSCULAR | Status: AC
  Administered 2021-06-28: 0.5 mL via INTRAMUSCULAR
  Filled 2021-06-28: qty 0.5

## 2021-06-28 MED ORDER — LIDOCAINE HCL (PF) 1 % IJ SOLN
30.0000 mL | Freq: Once | INTRAMUSCULAR | Status: DC
  Filled 2021-06-28: qty 30

## 2021-06-28 MED ORDER — HYDROCODONE-ACETAMINOPHEN 5-325 MG PO TABS
2.0000 | ORAL_TABLET | Freq: Once | ORAL | Status: AC
  Administered 2021-06-28: 2 via ORAL
  Filled 2021-06-28: qty 2

## 2021-06-28 NOTE — ED Triage Notes (Signed)
Pt reported to ED or evaluation of laceration to 3rd finger on right hand that was cut with dealing with broken glass today. States he "feels like glass is side it". No bleeding noted, area covered with bandaid.

## 2021-06-29 DIAGNOSIS — S61212A Laceration without foreign body of right middle finger without damage to nail, initial encounter: Secondary | ICD-10-CM | POA: Diagnosis not present

## 2021-06-29 MED ORDER — CEPHALEXIN 500 MG PO CAPS: 500.0000 mg | ORAL_CAPSULE | Freq: Two times a day (BID) | ORAL | 0 refills | Status: DC

## 2021-06-29 MED ORDER — HYDROCODONE-ACETAMINOPHEN 5-325 MG PO TABS: 1.0000 | ORAL_TABLET | Freq: Four times a day (QID) | ORAL | 0 refills | Status: DC | PRN

## 2021-06-29 MED ORDER — CEPHALEXIN 250 MG PO CAPS
500.0000 mg | ORAL_CAPSULE | Freq: Once | ORAL | Status: AC
  Administered 2021-06-29: 500 mg via ORAL
  Filled 2021-06-29: qty 2

## 2021-06-29 NOTE — ED Provider Notes (Signed)
[facility] EMERGENCY DEPARTMENT Provider Note   CSN: [id number] Arrival date & time: 06/28/21  1846     History  Chief Complaint  Patient presents with   Laceration    Cody Guzman is a 56 y.o. male.  The history is provided by the patient and the spouse.  Laceration Location:  Finger Finger laceration location:  R middle finger Time since incident:  19 hours Laceration mechanism:  Broken glass Pain details:    Quality:  Aching   Severity:  Severe   Timing:  Constant   Progression:  Worsening Relieved by:  Nothing Worsened by:  Movement and pressure Tetanus status:  Unknown Patient reports laceration to his right middle finger.  This occurred at 4 AM on February 24.  He reports he was picking up the dog bowl that was broken and he cut his finger.  He has had significant pain since that time.  He thinks there  might be glass in it.  He covered it with a Band-Aid    Home Medications Prior to Admission medications   Medication Sig Start Date End Date Taking? Authorizing Provider  cephALEXin (KEFLEX) 500 MG capsule Take 1 capsule (500 mg total) by mouth 2 (two) times daily. 06/29/21  Yes Ripley Fraise, MD  HYDROcodone-acetaminophen (NORCO/VICODIN) 5-325 MG tablet Take 1 tablet by mouth every 6 (six) hours as needed for severe pain. 06/29/21  Yes Ripley Fraise, MD  albuterol (PROVENTIL) (2.5 MG/3ML) 0.083% nebulizer solution INHALE CONTENTS OF 1 VIAL VIA NEBULIZER EVERY 6 HOURS AS NEEDED FOR WHEEZING OR FOR SHORTNESS OF BREATH 09/07/20   [name], MD  albuterol (VENTOLIN HFA) 108 (90 Base) MCG/ACT inhaler INHALE TWO (2) PUFFS BY MOUTH EVERY 6 HOURS AS NEEDED FOR SHORTNESS OF BREATH OF WHEEZING *NEEDS APPOINTMENT* 02/14/21   [name], MD  allopurinol (ZYLOPRIM) 100 MG tablet Take 1 tablet (100 mg total) by mouth daily. 06/10/21   [name], MD  amiodarone (PACERONE) 200 MG tablet Take 1 tablet (200 mg total) by mouth daily.  06/10/21 06/10/22  [name], MD  apixaban (ELIQUIS) 5 MG TABS tablet Take 1 tablet (5 mg total) by mouth 2 (two) times daily. 06/10/21 07/10/21  [name], MD  atorvastatin (LIPITOR) 80 MG tablet Take 1 tablet (80 mg total) by mouth daily. 06/10/21   [name], MD  carvedilol (COREG) 6.25 MG tablet Take 1 tablet (6.25 mg total) by mouth 2 (two) times daily with a meal. 06/10/21   [name], MD  colchicine 0.6 MG tablet Take 1 tablet (0.6 mg total) by mouth every other day. 06/10/21   [name], MD  gabapentin (NEURONTIN) 300 MG capsule Take 1 capsule (300 mg total) by mouth 3 (three) times daily. 06/10/21   [name], MD  irbesartan (AVAPRO) 75 MG tablet Take 0.5 tablets (37.5 mg total) by mouth daily. 06/10/21 07/10/21  [name], MD  isosorbide-hydrALAZINE (BIDIL) 20-37.5 MG tablet Take 1 tablet by mouth 3 (three) times daily. 06/10/21   [name], MD  metoprolol succinate (TOPROL-XL) 50 MG 24 hr tablet Take 1 tablet (50 mg total) by mouth daily. 05/16/21 06/15/21  Joette Catching, PA-C  nitroGLYCERIN (NITROSTAT) 0.4 MG SL tablet Place 1 tablet (0.4 mg total) under the tongue every 5 (five) minutes as needed for chest pain. 01/10/21   [name], MD  potassium chloride SA (KLOR-CON M) 20 MEQ tablet Take 2 tablets this afternoon and  2 tablets this evening, then 2 tablets every day 05/17/21   Joette Catching, PA-C  QUEtiapine (SEROQUEL) 100 MG tablet Take 1 tablet (100 mg total) by mouth at bedtime. 06/10/21   [name], MD  senna-docusate (SENOKOT-S) 8.6-50 MG tablet Take 1 tablet by mouth 2 (two) times daily. 01/10/21   [name], MD  spironolactone (ALDACTONE) 25 MG tablet Take 0.5 tablets (12.5 mg total) by mouth daily. 06/10/21   [name], MD  tamsulosin (FLOMAX) 0.4 MG CAPS capsule Take 1 capsule (0.4 mg total) by mouth daily. 06/10/21   [name], MD  torsemide (DEMADEX) 20 MG tablet Take 1 tablet (20 mg total) by  mouth daily. 06/10/21   [name], MD      Allergies    Patient has no known allergies.    Review of Systems   Review of Systems  Skin:  Positive for wound.  Neurological:  Negative for weakness.   Physical Exam Updated Vital Signs BP 102/81    Pulse 82    Temp 97.8 F (36.6 C) (Oral)    Resp 16    SpO2 94%  Physical Exam CONSTITUTIONAL: Well developed/well nourished, anxious HEAD: Normocephalic/atraumatic ENMT: Mucous membranes moist NECK: supple no meningeal signs LUNGS:  no apparent distress NEURO: Pt is awake/alert/appropriate, moves all extremitiesx4.  No facial droop.   EXTREMITIES: pulses normal/equal, full ROM See photo below. Full flexion extension is noted of the right middle finger SKIN: warm, color normal PSYCH: Anxious    ED Results / Procedures / Treatments   Labs (all labs ordered are listed, but only abnormal results are displayed) Labs Reviewed  I-STAT CHEM 8, ED - Abnormal; Notable for the following components:      Result Value   Creatinine, Ser 1.30 (*)    Calcium, Ion 1.09 (*)    All other components within normal limits    EKG None  Radiology DG Finger Middle Right  Result Date: 06/29/2021 CLINICAL DATA:  Right 3rd digit pain.  Laceration. EXAM: RIGHT MIDDLE FINGER 2+V COMPARISON:  None. FINDINGS: No acute bony abnormality. Specifically, no fracture, subluxation, or dislocation. Linear densities in the soft tissues overlying the distal phalanx could reflect glass fragments/foreign bodies. IMPRESSION: No fracture. Linear densities in the distal soft tissues could reflect foreign bodies. Electronically Signed   By: Rolm Baptise M.D.   On: 06/29/2021 00:12    Procedures Procedures    Medications Ordered in ED Medications  lidocaine (PF) (XYLOCAINE) 1 % injection 30 mL (30 mLs Infiltration Not Given 06/28/21 2335)  Tdap (BOOSTRIX) injection 0.5 mL (0.5 mLs Intramuscular Given 06/28/21 2336)  HYDROcodone-acetaminophen (NORCO/VICODIN) 5-325  MG per tablet 2 tablet (2 tablets Oral Given 06/28/21 2347)  cephALEXin (KEFLEX) capsule 500 mg (500 mg Oral Given 06/29/21 0104)    ED Course/ Medical Decision Making/ A&P                           Medical Decision Making Amount and/or Complexity of Data Reviewed Radiology: ordered.  Risk Prescription drug management.   Patient presents over 12 hours after injuring his right middle finger.  It is not amenable to wound repair.  Wound was cleansed extensively and no foreign bodies were noted.  Patient had significant pain on exam.  Will start antibiotics to help prevent any infection.  Short course of pain medicines have been provided.  I personally reviewed the x-ray there is no signs of  acute fracture. Wound care instructions discussed with patient and spouse. We discussed strict return precautions This was an acute injury requiring work-up          Final Clinical Impression(s) / ED Diagnoses Final diagnoses:  Laceration of right middle finger without foreign body without damage to nail, initial encounter    Rx / DC Orders ED Discharge Orders          Ordered    cephALEXin (KEFLEX) 500 MG capsule  2 times daily        06/29/21 0155    HYDROcodone-acetaminophen (NORCO/VICODIN) 5-325 MG tablet  Every 6 hours PRN        06/29/21 0155              Ripley Fraise, MD 06/29/21 [phone]

## 2021-07-05 ENCOUNTER — Telehealth: Payer: Self-pay

## 2021-07-05 NOTE — Telephone Encounter (Signed)
Message received from [facility] that they no longer stock foam wedges. ? ?Call placed to [facility], spoke to [place] who stated that they do not carry foam wedges and it is most likely an out of pocket expense.  ? ?Call placed to patient  [phone] to inform him of above. Message left with call back requested to this CM ?

## 2021-07-17 ENCOUNTER — Telehealth: Payer: Self-pay | Admitting: Critical Care Medicine

## 2021-07-17 NOTE — Telephone Encounter (Signed)
Copied from [place] [id number]. Topic: General - Call Back - No Documentation >> Jul 16, 2021  3:46 PM Erick Blinks wrote: Reason for CRM: Pt wants to speak to a social worker, because there is a document he needs from his PCP  Best contact: [phone]

## 2021-07-18 NOTE — Telephone Encounter (Signed)
Call placed to patient and informed him that [facility] do not carry the foam wedges and as per [employer], this is most likely an OTC product. ? ?The patient then said he needs a copy of a letter that he received from Dr [name] and Dr [name] would know what he is talking about.  He does not have the copy that Dr [name] gave him . ?

## 2021-07-18 NOTE — Telephone Encounter (Signed)
I am unaware of any letters I have written and no reference to same when  I look at my Feb OV note and no letters in the letter section of the chart ?

## 2021-08-07 ENCOUNTER — Telehealth: Payer: Self-pay | Admitting: Critical Care Medicine

## 2021-08-07 NOTE — Telephone Encounter (Signed)
Copied from [place] [id number]. Topic: General - Other >> Aug 07, 2021  2:37 PM Cody Guzman A wrote: Reason for CRM: The patient would like to speak with staff when possible about their service dog as well as the necessary steps to regain their license   The patient would like to discuss both of these concerns prior to scheduling an appointment   Please contact further

## 2021-08-08 NOTE — Telephone Encounter (Signed)
Called pt and scheduled appt

## 2021-08-15 ENCOUNTER — Ambulatory Visit (HOSPITAL_COMMUNITY): Admission: RE | Admit: 2021-08-15 | Payer: Medicare Other | Source: Ambulatory Visit

## 2021-08-15 ENCOUNTER — Encounter (HOSPITAL_COMMUNITY): Payer: Self-pay

## 2021-08-15 ENCOUNTER — Encounter (HOSPITAL_COMMUNITY): Payer: Commercial Managed Care - HMO | Admitting: Internal Medicine

## 2021-08-20 ENCOUNTER — Ambulatory Visit: Payer: Medicare Other | Attending: Critical Care Medicine | Admitting: Critical Care Medicine

## 2021-08-20 ENCOUNTER — Telehealth: Payer: Self-pay | Admitting: Critical Care Medicine

## 2021-08-20 ENCOUNTER — Encounter: Payer: Self-pay | Admitting: Critical Care Medicine

## 2021-08-20 ENCOUNTER — Other Ambulatory Visit: Payer: Self-pay

## 2021-08-20 VITALS — BP 93/63 | HR 88 | Wt 231.4 lb

## 2021-08-20 DIAGNOSIS — I10 Essential (primary) hypertension: Secondary | ICD-10-CM | POA: Diagnosis not present

## 2021-08-20 DIAGNOSIS — R300 Dysuria: Secondary | ICD-10-CM

## 2021-08-20 DIAGNOSIS — Z5181 Encounter for therapeutic drug level monitoring: Secondary | ICD-10-CM | POA: Diagnosis not present

## 2021-08-20 DIAGNOSIS — F32A Depression, unspecified: Secondary | ICD-10-CM

## 2021-08-20 DIAGNOSIS — I5022 Chronic systolic (congestive) heart failure: Secondary | ICD-10-CM | POA: Diagnosis not present

## 2021-08-20 DIAGNOSIS — I472 Ventricular tachycardia, unspecified: Secondary | ICD-10-CM | POA: Diagnosis not present

## 2021-08-20 DIAGNOSIS — N182 Chronic kidney disease, stage 2 (mild): Secondary | ICD-10-CM

## 2021-08-20 DIAGNOSIS — I4892 Unspecified atrial flutter: Secondary | ICD-10-CM

## 2021-08-20 DIAGNOSIS — I255 Ischemic cardiomyopathy: Secondary | ICD-10-CM

## 2021-08-20 DIAGNOSIS — F319 Bipolar disorder, unspecified: Secondary | ICD-10-CM

## 2021-08-20 DIAGNOSIS — M545 Low back pain, unspecified: Secondary | ICD-10-CM

## 2021-08-20 DIAGNOSIS — G4733 Obstructive sleep apnea (adult) (pediatric): Secondary | ICD-10-CM | POA: Diagnosis not present

## 2021-08-20 DIAGNOSIS — F17210 Nicotine dependence, cigarettes, uncomplicated: Secondary | ICD-10-CM

## 2021-08-20 DIAGNOSIS — F4310 Post-traumatic stress disorder, unspecified: Secondary | ICD-10-CM

## 2021-08-20 MED ORDER — TORSEMIDE 20 MG PO TABS
20.0000 mg | ORAL_TABLET | Freq: Every day | ORAL | 5 refills | Status: DC
  Filled 2021-08-20: qty 30, 30d supply, fill #0

## 2021-08-20 MED ORDER — QUETIAPINE FUMARATE ER 300 MG PO TB24
300.0000 mg | ORAL_TABLET | Freq: Every day | ORAL | 3 refills | Status: DC
Start: 2021-08-20 — End: 2022-10-16
  Filled 2021-08-20: qty 60, 60d supply, fill #0
  Filled 2022-02-09: qty 60, 60d supply, fill #1
  Filled 2022-08-11: qty 60, 60d supply, fill #2

## 2021-08-20 MED ORDER — METOPROLOL SUCCINATE ER 50 MG PO TB24
50.0000 mg | ORAL_TABLET | Freq: Every day | ORAL | 3 refills | Status: DC
  Filled 2021-08-20: qty 90, 90d supply, fill #0

## 2021-08-20 MED ORDER — VALPROIC ACID 250 MG PO CAPS
250.0000 mg | ORAL_CAPSULE | Freq: Three times a day (TID) | ORAL | 4 refills | Status: DC
Start: 2021-08-20 — End: 2022-10-16
  Filled 2021-08-20: qty 90, 30d supply, fill #0
  Filled 2022-01-09: qty 90, 30d supply, fill #1
  Filled 2022-02-09: qty 90, 30d supply, fill #2
  Filled 2022-08-11: qty 90, 30d supply, fill #3

## 2021-08-20 MED ORDER — IRBESARTAN 75 MG PO TABS
37.5000 mg | ORAL_TABLET | Freq: Every day | ORAL | 4 refills | Status: DC
  Filled 2021-08-20 – 2022-08-11 (×3): qty 30, 60d supply, fill #0

## 2021-08-20 MED ORDER — LITHIUM CARBONATE ER 300 MG PO TBCR
300.0000 mg | EXTENDED_RELEASE_TABLET | Freq: Two times a day (BID) | ORAL | 3 refills | Status: DC
  Filled 2021-08-20: qty 60, 30d supply, fill #0
  Filled 2021-10-17: qty 60, 30d supply, fill #1
  Filled 2022-01-09: qty 60, 30d supply, fill #2
  Filled 2022-02-09: qty 60, 30d supply, fill #3

## 2021-08-20 MED ORDER — POTASSIUM CHLORIDE CRYS ER 20 MEQ PO TBCR
40.0000 meq | EXTENDED_RELEASE_TABLET | Freq: Every day | ORAL | 3 refills | Status: DC
  Filled 2021-08-20 – 2022-01-09 (×2): qty 60, 30d supply, fill #0

## 2021-08-20 MED ORDER — CARVEDILOL 6.25 MG PO TABS
6.2500 mg | ORAL_TABLET | Freq: Two times a day (BID) | ORAL | 4 refills | Status: DC
  Filled 2021-08-20: qty 60, 30d supply, fill #0

## 2021-08-20 MED ORDER — ATORVASTATIN CALCIUM 80 MG PO TABS
80.0000 mg | ORAL_TABLET | Freq: Every day | ORAL | 5 refills | Status: DC
  Filled 2021-08-20 – 2022-02-09 (×2): qty 30, 30d supply, fill #0

## 2021-08-20 NOTE — Assessment & Plan Note (Signed)
History of ventricular tachycardia in the past currently is in sinus rhythm will need follow-up with cardiology ?

## 2021-08-20 NOTE — Assessment & Plan Note (Signed)
Currently stable on amiodarone ?

## 2021-08-20 NOTE — Assessment & Plan Note (Signed)
Heart failure with cardiomyopathy is compensated at this time needs cardiology follow-up and needs to have his echocardiogram repeated ? ?I have repeat ordered his echocardiogram and I will also order a referral back to cardiology and the Pharm.D. at the cardiologist office ?

## 2021-08-20 NOTE — Telephone Encounter (Signed)
This is a patient that has an order from [place] for an APAP device from [facility].  They placed the order I think they have been trying to call the patient he never answered patient states he did not receive any calls after initial contact from [facility].  All of this occurred back in February ?

## 2021-08-20 NOTE — Assessment & Plan Note (Signed)
Significant mood disorder improved with the use of Lipitor.  We will refill and obtain lithium levels ?We will need to refer back to another private psychiatry because he cannot go to the current psychiatrist due to lack of being in his network ?

## 2021-08-20 NOTE — Assessment & Plan Note (Addendum)
Need to reassess renal function we will check cmet ?

## 2021-08-20 NOTE — Assessment & Plan Note (Signed)
Patient is yet to achieve his CPAP machine we will partner with case management to see if we can get [facility] to deliver the machine I believe there is a communication breakdown and that he has not been answering his phone ?

## 2021-08-20 NOTE — Assessment & Plan Note (Signed)
? ? ??   Current smoking consumption amount: Less than 5 cigarettes daily ? ?Dicsussion on advise to quit smoking and smoking impacts: Cardiovascular impacts ?? Patient's willingness to quit: Willing to quit ? ?? Methods to quit smoking discussed: Behavioral modification ? ?? Medication management of smoking session drugs discussed: Not indicated ? ?? Resources provided:  AVS  ? ?? Setting quit date not established ? ?? Follow-up arranged 2 months ? ? ?Time spent counseling the patient: 5 minutes ? ?

## 2021-08-20 NOTE — Patient Instructions (Signed)
Refills on all medications sent to our pharmacy ? ?Lithium twice daily ordered sent to our pharmacy ? ?Return in 2 weeks for a lithium level ? ?Labs today include metabolic panel blood count urine studies ? ?We will inquire about your CPAP machine with [facility] ? ?Another psychiatry referral be made ? ?Referral back to cardiology will be made ? ?Paperwork for [facility] will be filled out ? ?Documentation of the telephone visits you requested will be provided to you ? ?Letter for service dog will be produced ? ?Focus on reducing tobacco use ? ?Return to Dr. [name] 2 months ?

## 2021-08-20 NOTE — Addendum Note (Signed)
Addended by: Asencion Noble E on: 08/20/2021 04:15 PM ? ? Modules accepted: Orders ? ?

## 2021-08-20 NOTE — Telephone Encounter (Signed)
I called [facility] # [phone], spoke to Diane who said that they were not able to reach the patient.  Attempts were made 06/19/2021 and 07/09/2021.  I confirmed that they have his correct phone number.  Diane said that all he needs to do is to call them and schedule set up/pick up.  The cost of the machine is covered 100% by his insurance.  ? ?Call placed to patient and  informed him of above information and the phone number for the company was text to him as he requested.  ?
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
## 2021-08-20 NOTE — Assessment & Plan Note (Signed)
Lumbar pain is improved with physical therapy ?

## 2021-08-20 NOTE — Assessment & Plan Note (Signed)
Hypertension well controlled on current medication program for his heart failure ?

## 2021-08-21 ENCOUNTER — Other Ambulatory Visit: Payer: Self-pay

## 2021-08-21 ENCOUNTER — Other Ambulatory Visit (HOSPITAL_COMMUNITY)
Admission: RE | Admit: 2021-08-21 | Discharge: 2021-08-21 | Disposition: A | Discharge status: Home / Self Care | Payer: Medicare Other | Source: Ambulatory Visit | Attending: Critical Care Medicine | Admitting: Critical Care Medicine

## 2021-08-21 DIAGNOSIS — R300 Dysuria: Secondary | ICD-10-CM | POA: Insufficient documentation

## 2021-08-21 LAB — CBC WITH DIFFERENTIAL/PLATELET
Basophils Absolute: 0 10*3/uL (ref 0.0–0.2)
Basos: 1 %
EOS (ABSOLUTE): 0.1 10*3/uL (ref 0.0–0.4)
Eos: 3 %
Hematocrit: 40.9 % (ref 37.5–51.0)
Hemoglobin: 13.9 g/dL (ref 13.0–17.7)
Immature Grans (Abs): 0 10*3/uL (ref 0.0–0.1)
Immature Granulocytes: 0 %
Lymphocytes Absolute: 2 10*3/uL (ref 0.7–3.1)
Lymphs: 38 %
MCH: 29.8 pg (ref 26.6–33.0)
MCHC: 34 g/dL (ref 31.5–35.7)
MCV: 88 fL (ref 79–97)
Monocytes Absolute: 0.5 10*3/uL (ref 0.1–0.9)
Monocytes: 9 %
Neutrophils Absolute: 2.6 10*3/uL (ref 1.4–7.0)
Neutrophils: 49 %
Platelets: 227 10*3/uL (ref 150–450)
RBC: 4.66 x10E6/uL (ref 4.14–5.80)
RDW: 14.5 % (ref 11.6–15.4)
WBC: 5.2 10*3/uL (ref 3.4–10.8)

## 2021-08-21 LAB — URINALYSIS
Bilirubin, UA: NEGATIVE
Glucose, UA: NEGATIVE
Ketones, UA: NEGATIVE
Leukocytes,UA: NEGATIVE
Nitrite, UA: NEGATIVE
Protein,UA: NEGATIVE
RBC, UA: NEGATIVE
Specific Gravity, UA: 1.006 (ref 1.005–1.030)
Urobilinogen, Ur: 0.2 mg/dL (ref 0.2–1.0)
pH, UA: 6.5 (ref 5.0–7.5)

## 2021-08-21 LAB — COMPREHENSIVE METABOLIC PANEL
ALT: 11 IU/L (ref 0–44)
AST: 21 IU/L (ref 0–40)
Albumin/Globulin Ratio: 1.7 (ref 1.2–2.2)
Albumin: 4.7 g/dL (ref 3.8–4.9)
Alkaline Phosphatase: 112 IU/L (ref 44–121)
BUN/Creatinine Ratio: 8 — ABNORMAL LOW (ref 9–20)
BUN: 12 mg/dL (ref 6–24)
Bilirubin Total: 3 mg/dL — ABNORMAL HIGH (ref 0.0–1.2)
CO2: 23 mmol/L (ref 20–29)
Calcium: 9.8 mg/dL (ref 8.7–10.2)
Chloride: 101 mmol/L (ref 96–106)
Creatinine, Ser: 1.43 mg/dL — ABNORMAL HIGH (ref 0.76–1.27)
Globulin, Total: 2.8 g/dL (ref 1.5–4.5)
Glucose: 88 mg/dL (ref 70–99)
Potassium: 3.3 mmol/L — ABNORMAL LOW (ref 3.5–5.2)
Sodium: 140 mmol/L (ref 134–144)
Total Protein: 7.5 g/dL (ref 6.0–8.5)
eGFR: 57 mL/min/{1.73_m2} — ABNORMAL LOW (ref >59)

## 2021-08-21 NOTE — Addendum Note (Signed)
Addended by: Leamon Arnt I on: 08/21/2021 03:13 PM ? ? Modules accepted: Orders ? ?

## 2021-08-22 ENCOUNTER — Telehealth: Payer: Self-pay

## 2021-08-22 LAB — URINE CYTOLOGY ANCILLARY ONLY
Chlamydia: NEGATIVE
Comment: NEGATIVE
Comment: NORMAL
Neisseria Gonorrhea: NEGATIVE

## 2021-08-22 LAB — URINE CULTURE: Organism ID, Bacteria: NO GROWTH

## 2021-08-22 NOTE — Telephone Encounter (Signed)
Pt was called and no vm was left due to mailbox being full. Information has been sent to nurse pool.  

## 2021-08-22 NOTE — Telephone Encounter (Signed)
-----   Message from [name], MD sent at 08/21/2021  9:11 AM EDT ----- ?Let [name] know kidney stable  potassium is low he needs to keep taking potassium as prescribed ?Urine was normal ,blood counts normal, waiting on urine cultures ?

## 2021-08-29 ENCOUNTER — Telehealth: Payer: Self-pay | Admitting: Clinical

## 2021-08-29 NOTE — Telephone Encounter (Signed)
I spoke with pt to verify that he was able to connect with counseling/psychiatry resource. I provided pt with [facility] information. He is also inquiring about the update on his cpap machine.  ?

## 2021-08-29 NOTE — Telephone Encounter (Signed)
I called patient and reminded him that we spoke on 08/20/2021 and I text him the phone number for [facility] and he just needs to call them to schedule pick up/delivery.  He said he recalls our conversation now and has the phone number to call.  He thinks he may have tried calling them and they were closed but he will try again.  ?

## 2021-09-10 ENCOUNTER — Other Ambulatory Visit: Payer: Self-pay | Admitting: Critical Care Medicine

## 2021-10-08 ENCOUNTER — Ambulatory Visit: Payer: Medicare Other | Admitting: Critical Care Medicine

## 2021-10-17 ENCOUNTER — Other Ambulatory Visit (HOSPITAL_COMMUNITY): Payer: Self-pay

## 2021-10-18 ENCOUNTER — Other Ambulatory Visit (HOSPITAL_COMMUNITY): Payer: Self-pay

## 2021-10-28 ENCOUNTER — Ambulatory Visit: Payer: Medicare Other | Admitting: Critical Care Medicine

## 2021-11-07 DIAGNOSIS — Z1211 Encounter for screening for malignant neoplasm of colon: Secondary | ICD-10-CM | POA: Diagnosis not present

## 2021-11-11 ENCOUNTER — Other Ambulatory Visit: Payer: Self-pay | Admitting: Critical Care Medicine

## 2021-11-12 NOTE — Telephone Encounter (Signed)
Change of pharmacy  Requested Prescriptions  Pending Prescriptions Disp Refills  . carvedilol (COREG) 6.25 MG tablet [Pharmacy Med Name: CARVEDILOL 6.25 MG TABS 6.25 Tablet] 60 tablet 1    Sig: TAKE 1 TABLET BY MOUTH TWICE DAILY WITH A MEAL     Cardiovascular: Beta Blockers 3 Failed - 11/11/2021  6:43 PM      Failed - Cr in normal range and within 360 days    Creatinine, Ser  Date Value Ref Range Status  08/20/2021 1.43 (H) 0.76 - 1.27 mg/dL Final         Passed - AST in normal range and within 360 days    AST  Date Value Ref Range Status  08/20/2021 21 0 - 40 IU/L Final         Passed - ALT in normal range and within 360 days    ALT  Date Value Ref Range Status  08/20/2021 11 0 - 44 IU/L Final         Passed - Last BP in normal range    BP Readings from Last 1 Encounters:  08/20/21 93/63         Passed - Last Heart Rate in normal range    Pulse Readings from Last 1 Encounters:  08/20/21 88         Passed - Valid encounter within last 6 months    Recent Outpatient Visits          2 months ago [place], MD   5 months ago OSA (obstructive sleep apnea)   [place], MD   8 months ago Ischemic cardiomyopathy   [place], MD   10 months ago Cerebrovascular accident (CVA) due to embolism of precerebral artery [facility])   [place] [name], MD   1 year ago Ischemic cardiomyopathy   [place], MD      Future Appointments            In 3 weeks [name], MD [place]

## 2021-11-14 LAB — COLOGUARD: COLOGUARD: POSITIVE — AB

## 2021-11-15 ENCOUNTER — Other Ambulatory Visit: Payer: Self-pay | Admitting: Critical Care Medicine

## 2021-11-15 DIAGNOSIS — R195 Other fecal abnormalities: Secondary | ICD-10-CM

## 2021-11-15 NOTE — Progress Notes (Signed)
Let the pt know his cologuard is POSITIVE , he will need a colonoscopy  it may just be polyps that are precancerous  in any case will send him to GI

## 2021-11-18 ENCOUNTER — Telehealth: Payer: Self-pay

## 2021-11-18 NOTE — Telephone Encounter (Signed)
-----   Message from [name], MD sent at 11/15/2021  6:03 AM EDT ----- Let the pt know his cologuard is POSITIVE , he will need a colonoscopy  it may just be polyps that are precancerous  in any case will send him to GI

## 2021-11-18 NOTE — Telephone Encounter (Signed)
Pt was called and is aware of results, DOB was confirmed.  ?

## 2021-12-06 ENCOUNTER — Other Ambulatory Visit: Payer: Self-pay

## 2021-12-09 ENCOUNTER — Encounter: Payer: Self-pay | Admitting: Critical Care Medicine

## 2021-12-09 ENCOUNTER — Telehealth: Payer: Self-pay | Admitting: Critical Care Medicine

## 2021-12-09 ENCOUNTER — Ambulatory Visit: Payer: Medicare Other | Attending: Critical Care Medicine | Admitting: Critical Care Medicine

## 2021-12-09 ENCOUNTER — Telehealth: Payer: Self-pay

## 2021-12-09 VITALS — BP 95/59 | HR 70 | Ht 68.0 in | Wt 225.4 lb

## 2021-12-09 DIAGNOSIS — R350 Frequency of micturition: Secondary | ICD-10-CM

## 2021-12-09 DIAGNOSIS — N529 Male erectile dysfunction, unspecified: Secondary | ICD-10-CM

## 2021-12-09 DIAGNOSIS — R300 Dysuria: Secondary | ICD-10-CM | POA: Diagnosis not present

## 2021-12-09 DIAGNOSIS — I509 Heart failure, unspecified: Secondary | ICD-10-CM | POA: Diagnosis not present

## 2021-12-09 DIAGNOSIS — G4733 Obstructive sleep apnea (adult) (pediatric): Secondary | ICD-10-CM | POA: Diagnosis not present

## 2021-12-09 DIAGNOSIS — M109 Gout, unspecified: Secondary | ICD-10-CM

## 2021-12-09 DIAGNOSIS — I10 Essential (primary) hypertension: Secondary | ICD-10-CM

## 2021-12-09 MED ORDER — METOPROLOL SUCCINATE ER 50 MG PO TB24: 50.0000 mg | ORAL_TABLET | Freq: Every day | ORAL | 3 refills | Status: DC

## 2021-12-09 NOTE — Assessment & Plan Note (Addendum)
Multiple negative urinalysis in the past- most recent April 2023 Will hold off on a repeat today, but will evaluate PSA levels  Will refer to urology for further evaluation and treatment  Continue tamsulosin and torsemide daily

## 2021-12-09 NOTE — Assessment & Plan Note (Signed)
On tamsulosin daily, per patient this has been helpful  Will refer to urology given additional urinary symptoms

## 2021-12-09 NOTE — Assessment & Plan Note (Signed)
Per cardiology, metoprolol preferred over carvedilol Will discontinue carvedilol and begin metoprolol 50 mg Referral to cardiology placed again Patient needs to answer phone, number verified in office today

## 2021-12-09 NOTE — Telephone Encounter (Signed)
Patient still has not received his CPAP machine. I called [facility] and spoke to [facility] who said that they never heard back from the patient and the order was cancelled.  They do not need a new order to re-instate the request, they just need a copy of recent visit note with PCP as well as copy of demographic information.  I spoke to the patient and confirmed his phone number.  He said that he spoke to [facility] and they were supposed to bring him the CPAP but they never showed up. I explained to him that we will request that the order be re-instated and I  reminded him that he needs to answer the phone and return any messages that he receives as [facility] may be trying to contact him.

## 2021-12-09 NOTE — Assessment & Plan Note (Addendum)
Patient has not received CPAP machine, appears to be communication issues Phone number on file verified today, emphasized importance of answering incoming unknown phone calls
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
## 2021-12-09 NOTE — Patient Instructions (Addendum)
[facility] ph# [phone] Fax # [phone] [address]   The different clinics have been trying to get a hold of you this is why you do not have appointments  The CPAP company is going to call you to try to get you the machine again please answer the phone and call  Please call the number above for [facility] for medication management for your psychiatric conditions  Rosana Hoes our new licensed clinical social worker will contact you for counseling  Discontinue carvedilol and begin metoprolol 50 mg daily  No other medication changes you have plenty of refills on all your medications  Another referral to cardiology was made  Gastroenterology will call you at a future date they are backed up and getting the colonoscopy is performed  Continue to work on reducing your tobacco intake  Another echocardiogram will be ordered please keep this appointment  Referral to urology was made  Return to see Dr. [name] 3 months

## 2021-12-09 NOTE — Telephone Encounter (Signed)
Please see this patient for anxiety and depression for counseling  also was referred to [facility] but pt said they never called

## 2021-12-09 NOTE — Assessment & Plan Note (Signed)
Controlled Continue daily allopurinol

## 2021-12-09 NOTE — Assessment & Plan Note (Signed)
Referred to heart failure clinic once more  Patient missed recent echocardiogram appointment in April 2023  Will reschedule

## 2021-12-10 LAB — PSA: Prostate Specific Ag, Serum: 0.8 ng/mL (ref 0.0–4.0)

## 2021-12-10 NOTE — Progress Notes (Signed)
Let pt know psa was NEG  no prostate cancer

## 2021-12-10 NOTE — Telephone Encounter (Signed)
Notes from patient's appointment with Dr [name] yesterday as well as demographic information faxed to [employer] # [phone] requesting reinstatement of CPAP order

## 2021-12-11 ENCOUNTER — Telehealth: Payer: Self-pay

## 2021-12-11 NOTE — Telephone Encounter (Signed)
Pt was called and is aware of results, DOB was confirmed.  ?

## 2021-12-11 NOTE — Telephone Encounter (Signed)
-----   Message from [name], MD sent at 12/10/2021  8:16 AM EDT ----- Let pt know psa was NEG  no prostate cancer

## 2021-12-13 NOTE — Telephone Encounter (Signed)
Pt was referred for anxiety and depression, [facility] contacted pt today via phone. Pt shared what resources he has tried and did not have luck. [facility] contacted a couple of resources to make sure sure they accept his insurance. [facility] provided pt with the resources listed below. Pt stated he would call on Monday to schedule an appointment.   [employer], [address],  [place], [place] [address] [phone]  [facility] [address] [place], [place] [address] [phone]

## 2021-12-19 ENCOUNTER — Ambulatory Visit: Payer: Self-pay | Admitting: *Deleted

## 2021-12-19 NOTE — Telephone Encounter (Signed)
Pharm calling for clarification if pt to be on Metoprolol and Carvedilol. Informed pharm that Carvedilol was d/c'd 12/09/21 by Dr. [name] per his OV note and current med list.  Reason for Disposition  Health Information question, no triage required and triager able to answer question  Answer Assessment - Initial Assessment Questions 1. REASON FOR CALL or QUESTION: "What is your reason for calling today?" or "How can I best help you?" or "What question do you have that I can help answer?"     Pharm calling for clarification on if Carvedilol was to be continued.  Protocols used: Information Only Call - No Triage-A-AH

## 2021-12-19 NOTE — Telephone Encounter (Signed)
Summary: medication clarification   Pharmacy states pt has a rx for metprolol prescribed on 8-7 and carvedilol prescribed on 7-11   Pharmacy needs clarification on which medication patient should be taking   Please advise      [facility] called and notified that the Metoprolol is correct and the Carvedilol was discontinued on 12/09/21 per Dr. Ileana Roup OV note, and according to current med list.

## 2022-01-09 ENCOUNTER — Ambulatory Visit (HOSPITAL_COMMUNITY)
Admission: RE | Admit: 2022-01-09 | Discharge: 2022-01-09 | Disposition: A | Discharge status: Home / Self Care | Payer: Medicare Other | Source: Ambulatory Visit | Attending: Critical Care Medicine | Admitting: Critical Care Medicine

## 2022-01-09 ENCOUNTER — Other Ambulatory Visit (HOSPITAL_COMMUNITY): Payer: Self-pay

## 2022-01-09 DIAGNOSIS — I420 Dilated cardiomyopathy: Secondary | ICD-10-CM | POA: Insufficient documentation

## 2022-01-09 DIAGNOSIS — I509 Heart failure, unspecified: Secondary | ICD-10-CM | POA: Insufficient documentation

## 2022-01-09 NOTE — Progress Notes (Signed)
Echocardiogram 2D Echocardiogram has been performed.  Cody Guzman 01/09/2022, 4:26 PM

## 2022-01-10 ENCOUNTER — Other Ambulatory Visit (HOSPITAL_COMMUNITY): Payer: Self-pay

## 2022-01-10 LAB — ECHOCARDIOGRAM COMPLETE
Area-P 1/2: 3.65 cm2
Calc EF: 24.4 %
Radius: 0.3 cm
S' Lateral: 5 cm
Single Plane A2C EF: 26.9 %
Single Plane A4C EF: 18.4 %

## 2022-01-10 NOTE — Progress Notes (Signed)
Jane and carly pls let pt know his heart function is much worse. I made a referral to chf clinic one month ago. Pls call clinic to get him in asap and let pt know very important he go to heart doctor

## 2022-01-13 ENCOUNTER — Telehealth: Payer: Self-pay

## 2022-01-13 ENCOUNTER — Other Ambulatory Visit (HOSPITAL_COMMUNITY): Payer: Self-pay

## 2022-01-13 NOTE — Telephone Encounter (Signed)
Pt was called and is aware of results, DOB was confirmed.  ?

## 2022-01-13 NOTE — Telephone Encounter (Signed)
-----   Message from [name], MD sent at 01/10/2022  2:30 PM EDT ----- Cody Guzman and Cody Guzman pls let pt know his heart function is much worse. I made a referral to chf clinic one month ago. Pls call clinic to get him in asap and let pt know very important he go to heart doctor

## 2022-01-14 ENCOUNTER — Other Ambulatory Visit (HOSPITAL_COMMUNITY): Payer: Self-pay

## 2022-01-14 NOTE — Telephone Encounter (Signed)
Called patient and he is aware of appointment

## 2022-01-15 ENCOUNTER — Other Ambulatory Visit (HOSPITAL_COMMUNITY): Payer: Self-pay

## 2022-01-16 ENCOUNTER — Encounter (HOSPITAL_COMMUNITY): Payer: Self-pay | Admitting: Internal Medicine

## 2022-01-16 ENCOUNTER — Other Ambulatory Visit (HOSPITAL_COMMUNITY): Payer: Self-pay

## 2022-01-16 ENCOUNTER — Telehealth: Payer: Self-pay

## 2022-01-16 ENCOUNTER — Ambulatory Visit (HOSPITAL_COMMUNITY)
Admission: RE | Admit: 2022-01-16 | Discharge: 2022-01-16 | Disposition: A | Discharge status: Home / Self Care | Payer: Medicare Other | Source: Ambulatory Visit | Attending: Internal Medicine | Admitting: Internal Medicine

## 2022-01-16 ENCOUNTER — Other Ambulatory Visit: Payer: Self-pay

## 2022-01-16 VITALS — BP 104/86 | HR 83 | Wt 223.4 lb

## 2022-01-16 DIAGNOSIS — Z8679 Personal history of other diseases of the circulatory system: Secondary | ICD-10-CM | POA: Insufficient documentation

## 2022-01-16 DIAGNOSIS — I252 Old myocardial infarction: Secondary | ICD-10-CM | POA: Diagnosis not present

## 2022-01-16 DIAGNOSIS — I4892 Unspecified atrial flutter: Secondary | ICD-10-CM | POA: Diagnosis not present

## 2022-01-16 DIAGNOSIS — I5022 Chronic systolic (congestive) heart failure: Secondary | ICD-10-CM | POA: Diagnosis not present

## 2022-01-16 DIAGNOSIS — Z7984 Long term (current) use of oral hypoglycemic drugs: Secondary | ICD-10-CM | POA: Diagnosis not present

## 2022-01-16 DIAGNOSIS — G4733 Obstructive sleep apnea (adult) (pediatric): Secondary | ICD-10-CM | POA: Insufficient documentation

## 2022-01-16 DIAGNOSIS — I5082 Biventricular heart failure: Secondary | ICD-10-CM | POA: Diagnosis not present

## 2022-01-16 DIAGNOSIS — Z8249 Family history of ischemic heart disease and other diseases of the circulatory system: Secondary | ICD-10-CM | POA: Diagnosis not present

## 2022-01-16 DIAGNOSIS — Z28310 Unvaccinated for covid-19: Secondary | ICD-10-CM | POA: Diagnosis not present

## 2022-01-16 DIAGNOSIS — Z79899 Other long term (current) drug therapy: Secondary | ICD-10-CM | POA: Diagnosis not present

## 2022-01-16 DIAGNOSIS — I25119 Atherosclerotic heart disease of native coronary artery with unspecified angina pectoris: Secondary | ICD-10-CM | POA: Diagnosis not present

## 2022-01-16 DIAGNOSIS — I340 Nonrheumatic mitral (valve) insufficiency: Secondary | ICD-10-CM | POA: Insufficient documentation

## 2022-01-16 DIAGNOSIS — I2510 Atherosclerotic heart disease of native coronary artery without angina pectoris: Secondary | ICD-10-CM

## 2022-01-16 DIAGNOSIS — Z7901 Long term (current) use of anticoagulants: Secondary | ICD-10-CM | POA: Diagnosis not present

## 2022-01-16 DIAGNOSIS — F17210 Nicotine dependence, cigarettes, uncomplicated: Secondary | ICD-10-CM | POA: Diagnosis not present

## 2022-01-16 MED ORDER — POTASSIUM CHLORIDE CRYS ER 20 MEQ PO TBCR
40.0000 meq | EXTENDED_RELEASE_TABLET | ORAL | 3 refills | Status: DC | PRN
  Filled 2022-01-16: qty 30, fill #0
  Filled 2022-02-09: qty 30, 15d supply, fill #0

## 2022-01-16 MED ORDER — EMPAGLIFLOZIN 10 MG PO TABS
10.0000 mg | ORAL_TABLET | Freq: Every day | ORAL | 3 refills | Status: DC
  Filled 2022-01-16 – 2022-02-09 (×2): qty 90, 90d supply, fill #0
  Filled 2022-08-11: qty 90, 90d supply, fill #1
  Filled 2022-11-27 – 2022-12-03 (×2): qty 90, 90d supply, fill #2

## 2022-01-16 MED ORDER — TORSEMIDE 20 MG PO TABS
20.0000 mg | ORAL_TABLET | ORAL | 3 refills | Status: DC | PRN
  Filled 2022-01-16 – 2022-02-09 (×2): qty 30, 30d supply, fill #0
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
## 2022-01-16 NOTE — Patient Instructions (Signed)
Medication Changes:  START Jardiance 10 mg Daily  Change Torsemide to 20 mg only AS NEEDED  Change Potassium to 40 meq (2 tabs) only AS NEEDED  Lab Work:  Labs done Monday 01/20/22  Testing/Procedures:  none  Referrals:  You have been referred to [facility] to discuss getting a defibrillator, they will call you for an appointment  Special Instructions // Education:  Do the following things EVERYDAY: Weigh yourself in the morning before breakfast. Write it down and keep it in a log. Take your medicines as prescribed Eat low salt foods--Limit salt (sodium) to 2000 mg per day.  Stay as active as you can everyday Limit all fluids for the day to less than 2 liters   Follow-Up in: 3 months  At the [facility], you and your health needs are our priority. We have a designated team specialized in the treatment of Heart Failure. This Care Team includes your primary Heart Failure Specialized Cardiologist (physician), Advanced Practice Providers (APPs- Physician Assistants and Nurse Practitioners), and Pharmacist who all work together to provide you with the care you need, when you need it.   You may see any of the following providers on your designated Care Team at your next follow up:  Dr. Glori Bickers Dr. Loralie Champagne Dr. Roxana Hires, NP Lyda Jester, [place] [facility], [place] Forestine Na, NP Audry Riles, PharmD   Please be sure to bring in all your medications bottles to every appointment.   Need to Contact us:  If you have any questions or concerns before your next appointment please send [place] a message through [place] or call our office at [phone].    TO LEAVE A MESSAGE FOR THE NURSE SELECT OPTION 2, PLEASE LEAVE A MESSAGE INCLUDING: YOUR NAME DATE OF BIRTH CALL BACK NUMBER REASON FOR CALL**this is important as we prioritize the call backs  YOU WILL RECEIVE A CALL BACK THE SAME DAY AS LONG AS YOU CALL  BEFORE 4:00 PM

## 2022-01-16 NOTE — Progress Notes (Signed)
Pt in a hurry to leave due to transportation and could not wait for labs or paperwork. Lab appt sch for Mon 9/18, pt will get AVS at that time, Dr Haroldine Laws is aware.  Kevan Rosebush, RN, BSN, [facility] Specialty Coordinator Advanced [facility] Failure Clinic

## 2022-01-16 NOTE — Telephone Encounter (Signed)
I called [facility] to check on status of CPAP order. Initially I spoke to Diane who said she has no record of order being re-instated,  She then transferred me to Francoise Ceo, who I spoke to on 12/09/2021. Voicemail message left with call back requested.

## 2022-01-20 ENCOUNTER — Ambulatory Visit (HOSPITAL_COMMUNITY)
Admission: RE | Admit: 2022-01-20 | Discharge: 2022-01-20 | Disposition: A | Discharge status: Home / Self Care | Payer: Medicare Other | Source: Ambulatory Visit | Attending: Cardiology | Admitting: Cardiology

## 2022-01-20 DIAGNOSIS — I5022 Chronic systolic (congestive) heart failure: Secondary | ICD-10-CM | POA: Insufficient documentation

## 2022-01-20 LAB — COMPREHENSIVE METABOLIC PANEL
ALT: 12 U/L (ref 0–44)
AST: 21 U/L (ref 15–41)
Albumin: 3.9 g/dL (ref 3.5–5.0)
Alkaline Phosphatase: 86 U/L (ref 38–126)
Anion gap: 13 (ref 5–15)
BUN: 12 mg/dL (ref 6–20)
CO2: 21 mmol/L — ABNORMAL LOW (ref 22–32)
Calcium: 10 mg/dL (ref 8.9–10.3)
Chloride: 106 mmol/L (ref 98–111)
Creatinine, Ser: 1.43 mg/dL — ABNORMAL HIGH (ref 0.61–1.24)
GFR, Estimated: 57 mL/min — ABNORMAL LOW (ref >60)
Glucose, Bld: 99 mg/dL (ref 70–99)
Potassium: 4.1 mmol/L (ref 3.5–5.1)
Sodium: 140 mmol/L (ref 135–145)
Total Bilirubin: 1.4 mg/dL — ABNORMAL HIGH (ref 0.3–1.2)
Total Protein: 7.1 g/dL (ref 6.5–8.1)

## 2022-01-20 LAB — BRAIN NATRIURETIC PEPTIDE: B Natriuretic Peptide: 585.8 pg/mL — ABNORMAL HIGH (ref 0.0–100.0)

## 2022-01-23 ENCOUNTER — Other Ambulatory Visit: Payer: Self-pay

## 2022-02-07 ENCOUNTER — Other Ambulatory Visit: Payer: Self-pay | Admitting: Critical Care Medicine

## 2022-02-07 DIAGNOSIS — J4520 Mild intermittent asthma, uncomplicated: Secondary | ICD-10-CM

## 2022-02-07 DIAGNOSIS — G8929 Other chronic pain: Secondary | ICD-10-CM

## 2022-02-10 ENCOUNTER — Other Ambulatory Visit (HOSPITAL_COMMUNITY): Payer: Self-pay

## 2022-02-10 NOTE — Telephone Encounter (Signed)
Requested medication (s) are due for refill today:   Yes and No, provider to review non delegated  Requested medication (s) are on the active medication list:   Yes for all 4  Future visit scheduled:   Yes 03/12/2022 with [place]     LOV 12/09/2021 with [name]   Last ordered: Seroquel discontinued 06/10/2021 - non delegated;  Flomax 06/10/2021 #30, 10 refills;  allopurinol 06/10/2021 #30, 10 refills;  albuterol aerosol 09/07/2020 150 ml, 10 refills - old rx   Requested Prescriptions  Pending Prescriptions Disp Refills   tamsulosin (FLOMAX) 0.4 MG CAPS capsule [Pharmacy Med Name: TAMSULOSIN 0.4 MG CAPS 0.4 Capsule] 30 capsule 10    Sig: TAKE 1 CAPSULE BY MOUTH DAILY     Urology: Alpha-Adrenergic Blocker Passed - 02/07/2022  6:25 PM      Passed - PSA in normal range and within 360 days    Prostate Specific Ag, Serum  Date Value Ref Range Status  12/09/2021 0.8 0.0 - 4.0 ng/mL Final    Comment:    Roche ECLIA methodology. According to the American Urological Association, Serum PSA should decrease and remain at undetectable levels after radical prostatectomy. The AUA defines biochemical recurrence as an initial PSA value 0.2 ng/mL or greater followed by a subsequent confirmatory PSA value 0.2 ng/mL or greater. Values obtained with different assay methods or kits cannot be used interchangeably. Results cannot be interpreted as absolute evidence of the presence or absence of malignant disease.          Passed - Last BP in normal range    BP Readings from Last 1 Encounters:  01/16/22 104/86         Passed - Valid encounter within last 12 months    Recent Outpatient Visits           2 months ago Essential hypertension   [place], MD   5 months ago [place] [name], MD   8 months ago OSA (obstructive sleep apnea)   [place] [name], MD   11  months ago Ischemic cardiomyopathy   [place], Patrick E, MD   1 year ago Cerebrovascular accident (CVA) due to embolism of precerebral artery [facility])   [place] [name], MD       Future Appointments             In 1 month Thereasa Solo, Dionne Bucy, PA-C [place]             allopurinol (ZYLOPRIM) 100 MG tablet [Pharmacy Med Name: ALLOPURINOL 100 MG TABS 100 Tablet] 30 tablet 10    Sig: TAKE 1 TABLET BY MOUTH DAILY     Endocrinology:  Gout Agents - allopurinol Failed - 02/07/2022  6:25 PM      Failed - Uric Acid in normal range and within 360 days    Uric Acid  Date Value Ref Range Status  09/26/2019 8.7 (H) 3.8 - 8.4 mg/dL Final    Comment:               Therapeutic target for gout patients: <6.0         Failed - Cr in normal range and within 360 days    Creatinine, Ser  Date Value Ref Range Status  01/20/2022 1.43 (H) 0.61 - 1.24  mg/dL Final         Passed - Valid encounter within last 12 months    Recent Outpatient Visits           2 months ago Essential hypertension   [place] [name], MD   5 months ago [place] [name], MD   8 months ago OSA (obstructive sleep apnea)   [place] [name], MD   11 months ago Ischemic cardiomyopathy   [place] [name], MD   1 year ago Cerebrovascular accident (CVA) due to embolism of precerebral artery [facility]   [place] [name], MD       Future Appointments             In 1 month Thereasa Solo Dionne Bucy, PA-C [place] - CBC within normal limits and completed in the last 12 months    WBC  Date Value Ref Range Status  08/20/2021 5.2 3.4 - 10.8 x10E3/uL  Final  05/16/2021 4.9 4.0 - 10.5 K/uL Final   RBC  Date Value Ref Range Status  08/20/2021 4.66 4.14 - 5.80 x10E6/uL Final  05/16/2021 4.77 4.22 - 5.81 MIL/uL Final   Hemoglobin  Date Value Ref Range Status  08/20/2021 13.9 13.0 - 17.7 g/dL Final   Hematocrit  Date Value Ref Range Status  08/20/2021 40.9 37.5 - 51.0 % Final   MCHC  Date Value Ref Range Status  08/20/2021 34.0 31.5 - 35.7 g/dL Final  05/16/2021 34.1 30.0 - 36.0 g/dL Final   [facility]  Date Value Ref Range Status  08/20/2021 29.8 26.6 - 33.0 pg Final  05/16/2021 30.0 26.0 - 34.0 pg Final   MCV  Date Value Ref Range Status  08/20/2021 88 79 - 97 fL Final   No results found for: "PLTCOUNTKUC", "LABPLAT", "POCPLA" RDW  Date Value Ref Range Status  08/20/2021 14.5 11.6 - 15.4 % Final          albuterol (VENTOLIN HFA) 108 (90 Base) MCG/ACT inhaler [Pharmacy Med Name: ALBUTEROL HFA *PROV* 90MC 108 (90 BAS Aerosol] 6.7 g 10    Sig: INHALE TWO (2) PUFFS BY MOUTH EVERY 6 HOURS AS NEEDED FOR SHORTNESS OF BREATH OR WHEEZING *PATIENT NEEDS APPOINTMENT*     Pulmonology:  Beta Agonists 2 Passed - 02/07/2022  6:25 PM      Passed - Last BP in normal range    BP Readings from Last 1 Encounters:  01/16/22 104/86         Passed - Last Heart Rate in normal range    Pulse Readings from Last 1 Encounters:  01/16/22 83         Passed - Valid encounter within last 12 months    Recent Outpatient Visits           2 months ago Essential hypertension   [place], MD   5 months ago [place], MD   8 months ago OSA (obstructive sleep apnea)   [place] [name], MD   11 months ago Ischemic cardiomyopathy   [place] [name], MD  1 year ago Cerebrovascular accident (CVA) due to embolism of precerebral artery [facility])   [place], MD       Future Appointments             In 1 month Thereasa Solo, Dionne Bucy, PA-C [place]             QUEtiapine (SEROQUEL) 100 MG tablet [Pharmacy Med Name: QUETIAPINE 100MG TAB 100 Tablet] 30 tablet 10    Sig: TAKE 1 TABLET BY MOUTH AT BEDTIME     Not Delegated - Psychiatry:  Antipsychotics - Second Generation (Atypical) - quetiapine Failed - 02/07/2022  6:25 PM      Failed - This refill cannot be delegated      Failed - Lipid Panel in normal range within the last 12 months    Cholesterol, Total  Date Value Ref Range Status  08/24/2018 133 100 - 199 mg/dL Final   Cholesterol  Date Value Ref Range Status  08/27/2020 168 0 - 200 mg/dL Final   LDL Calculated  Date Value Ref Range Status  08/24/2018 76 0 - 99 mg/dL Final   LDL Cholesterol  Date Value Ref Range Status  08/27/2020 120 (H) 0 - 99 mg/dL Final    Comment:           Total Cholesterol/HDL:CHD Risk Coronary Heart Disease Risk Table                     Men   Women  1/2 Average Risk   3.4   3.3  Average Risk       5.0   4.4  2 X Average Risk   9.6   7.1  3 X Average Risk  23.4   11.0        Use the calculated Patient Ratio above and the CHD Risk Table to determine the patient's CHD Risk.        ATP III CLASSIFICATION (LDL):  <100     mg/dL   Optimal  100-129  mg/dL   Near or Above                    Optimal  130-159  mg/dL   Borderline  160-189  mg/dL   High  >190     mg/dL   Very High Performed at [facility] [address] [place], [place] [address]    HDL  Date Value Ref Range Status  08/27/2020 36 (L) >40 mg/dL Final  08/24/2018 45 >39 mg/dL Final   Triglycerides  Date Value Ref Range Status  08/27/2020 59 <150 mg/dL Final         Passed - TSH in normal range and within 360 days    TSH  Date Value Ref Range Status  05/16/2021 3.279 0.350 - 4.500 uIU/mL Final    Comment:    Performed by a 3rd  Generation assay with a functional sensitivity of <=0.01 uIU/mL. Performed at [facility], [place] [address], [place], [place] [address]          Passed - Completed PHQ-2 or PHQ-9 in the last 360 days      Passed - Last BP in normal range    BP Readings from Last 1 Encounters:  01/16/22 104/86         Passed - Last Heart Rate in normal range    Pulse Readings from Last 1  Encounters:  01/16/22 83         Passed - Valid encounter within last 6 months    Recent Outpatient Visits           2 months ago Essential hypertension   [place], MD   5 months ago [place] [name], MD   8 months ago OSA (obstructive sleep apnea)   [place] [name], MD   11 months ago Ischemic cardiomyopathy   [place] [name], MD   1 year ago Cerebrovascular accident (CVA) due to embolism of precerebral artery [facility])   [place] [name], MD       Future Appointments             In 1 month Thereasa Solo Dionne Bucy, PA-C [place] - CBC within normal limits and completed in the last 12 months    WBC  Date Value Ref Range Status  08/20/2021 5.2 3.4 - 10.8 x10E3/uL Final  05/16/2021 4.9 4.0 - 10.5 K/uL Final   RBC  Date Value Ref Range Status  08/20/2021 4.66 4.14 - 5.80 x10E6/uL Final  05/16/2021 4.77 4.22 - 5.81 MIL/uL Final   Hemoglobin  Date Value Ref Range Status  08/20/2021 13.9 13.0 - 17.7 g/dL Final   Hematocrit  Date Value Ref Range Status  08/20/2021 40.9 37.5 - 51.0 % Final   MCHC  Date Value Ref Range Status  08/20/2021 34.0 31.5 - 35.7 g/dL Final  05/16/2021 34.1 30.0 - 36.0 g/dL Final   [facility]  Date Value Ref Range Status  08/20/2021 29.8 26.6 - 33.0 pg Final  05/16/2021 30.0 26.0 - 34.0 pg Final    MCV  Date Value Ref Range Status  08/20/2021 88 79 - 97 fL Final   No results found for: "PLTCOUNTKUC", "LABPLAT", "POCPLA" RDW  Date Value Ref Range Status  08/20/2021 14.5 11.6 - 15.4 % Final         Passed - CMP within normal limits and completed in the last 12 months    Albumin  Date Value Ref Range Status  01/20/2022 3.9 3.5 - 5.0 g/dL Final  08/20/2021 4.7 3.8 - 4.9 g/dL Final   Alkaline Phosphatase  Date Value Ref Range Status  01/20/2022 86 38 - 126 U/L Final   ALT  Date Value Ref Range Status  01/20/2022 12 0 - 44 U/L Final   AST  Date Value Ref Range Status  01/20/2022 21 15 - 41 U/L Final   BUN  Date Value Ref Range Status  01/20/2022 12 6 - 20 mg/dL Final  08/20/2021 12 6 - 24 mg/dL Final   Calcium  Date Value Ref Range Status  01/20/2022 10.0 8.9 - 10.3 mg/dL Final   Calcium, Ion  Date Value Ref Range Status  06/28/2021 1.09 (L) 1.15 - 1.40 mmol/L Final   CO2  Date Value Ref Range Status  01/20/2022 21 (L) 22 - 32 mmol/L Final   TCO2  Date Value Ref Range Status  06/28/2021 24 22 - 32 mmol/L Final   Creatinine, Ser  Date Value Ref Range Status  01/20/2022 1.43 (H) 0.61 - 1.24 mg/dL Final   Glucose, Bld  Date Value Ref Range Status  01/20/2022 99 70 -  99 mg/dL Final    Comment:    Glucose reference range applies only to samples taken after fasting for at least 8 hours.   Potassium  Date Value Ref Range Status  01/20/2022 4.1 3.5 - 5.1 mmol/L Final   Sodium  Date Value Ref Range Status  01/20/2022 140 135 - 145 mmol/L Final  08/20/2021 140 134 - 144 mmol/L Final   Total Bilirubin  Date Value Ref Range Status  01/20/2022 1.4 (H) 0.3 - 1.2 mg/dL Final   Bilirubin Total  Date Value Ref Range Status  08/20/2021 3.0 (H) 0.0 - 1.2 mg/dL Final    Comment:    Note: Specimen is icteric.   Bilirubin, Direct  Date Value Ref Range Status  07/10/2018 0.2 0.0 - 0.2 mg/dL Final   Indirect Bilirubin  Date Value Ref Range Status   07/10/2018 0.8 0.3 - 0.9 mg/dL Final    Comment:    Performed at [facility] [name], [place], [place] [address]   Protein, ur  Date Value Ref Range Status  06/17/2020 100 (A) NEGATIVE mg/dL Final   Protein,UA  Date Value Ref Range Status  08/20/2021 Negative Negative/Trace Final   Total Protein  Date Value Ref Range Status  01/20/2022 7.1 6.5 - 8.1 g/dL Final  08/20/2021 7.5 6.0 - 8.5 g/dL Final   GFR calc Af Amer  Date Value Ref Range Status  09/26/2019 79 >59 mL/min/1.73 Final    Comment:    **[facility] currently reports eGFR in compliance with the current**   recommendations of the [employer]. [facility] will   update reporting as new guidelines are published from the NKF-ASN   Task force.    eGFR  Date Value Ref Range Status  08/20/2021 57 (L) >59 mL/min/1.73 Final   GFR, Estimated  Date Value Ref Range Status  01/20/2022 57 (L) >60 mL/min Final    Comment:    (NOTE) Calculated using the CKD-EPI Creatinine Equation (2021)

## 2022-02-11 ENCOUNTER — Other Ambulatory Visit (HOSPITAL_COMMUNITY): Payer: Self-pay

## 2022-02-13 ENCOUNTER — Institutional Professional Consult (permissible substitution): Payer: Medicare Other | Admitting: Internal Medicine
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
## 2022-02-17 ENCOUNTER — Telehealth (HOSPITAL_COMMUNITY): Payer: Self-pay | Admitting: Licensed Clinical Social Worker

## 2022-02-17 ENCOUNTER — Ambulatory Visit (HOSPITAL_COMMUNITY)
Admission: RE | Admit: 2022-02-17 | Discharge: 2022-02-17 | Disposition: A | Discharge status: Home / Self Care | Payer: Medicare Other | Source: Ambulatory Visit | Attending: Internal Medicine | Admitting: Internal Medicine

## 2022-02-17 VITALS — BP 110/80 | HR 81 | Wt 230.4 lb

## 2022-02-17 DIAGNOSIS — R0902 Hypoxemia: Secondary | ICD-10-CM | POA: Insufficient documentation

## 2022-02-17 DIAGNOSIS — Z7901 Long term (current) use of anticoagulants: Secondary | ICD-10-CM | POA: Insufficient documentation

## 2022-02-17 DIAGNOSIS — G4733 Obstructive sleep apnea (adult) (pediatric): Secondary | ICD-10-CM

## 2022-02-17 DIAGNOSIS — I4892 Unspecified atrial flutter: Secondary | ICD-10-CM | POA: Diagnosis not present

## 2022-02-17 DIAGNOSIS — N183 Chronic kidney disease, stage 3 unspecified: Secondary | ICD-10-CM | POA: Insufficient documentation

## 2022-02-17 DIAGNOSIS — I2510 Atherosclerotic heart disease of native coronary artery without angina pectoris: Secondary | ICD-10-CM | POA: Insufficient documentation

## 2022-02-17 DIAGNOSIS — I5082 Biventricular heart failure: Secondary | ICD-10-CM | POA: Diagnosis not present

## 2022-02-17 DIAGNOSIS — I340 Nonrheumatic mitral (valve) insufficiency: Secondary | ICD-10-CM | POA: Insufficient documentation

## 2022-02-17 DIAGNOSIS — I240 Acute coronary thrombosis not resulting in myocardial infarction: Secondary | ICD-10-CM | POA: Diagnosis not present

## 2022-02-17 DIAGNOSIS — I5022 Chronic systolic (congestive) heart failure: Secondary | ICD-10-CM | POA: Diagnosis not present

## 2022-02-17 DIAGNOSIS — Z9221 Personal history of antineoplastic chemotherapy: Secondary | ICD-10-CM | POA: Insufficient documentation

## 2022-02-17 LAB — COMPREHENSIVE METABOLIC PANEL
ALT: 12 U/L (ref 0–44)
AST: 17 U/L (ref 15–41)
Albumin: 3.6 g/dL (ref 3.5–5.0)
Alkaline Phosphatase: 77 U/L (ref 38–126)
Anion gap: 7 (ref 5–15)
BUN: 9 mg/dL (ref 6–20)
CO2: 22 mmol/L (ref 22–32)
Calcium: 8.4 mg/dL — ABNORMAL LOW (ref 8.9–10.3)
Chloride: 109 mmol/L (ref 98–111)
Creatinine, Ser: 1.64 mg/dL — ABNORMAL HIGH (ref 0.61–1.24)
GFR, Estimated: 48 mL/min — ABNORMAL LOW (ref >60)
Glucose, Bld: 81 mg/dL (ref 70–99)
Potassium: 3.6 mmol/L (ref 3.5–5.1)
Sodium: 138 mmol/L (ref 135–145)
Total Bilirubin: 0.9 mg/dL (ref 0.3–1.2)
Total Protein: 6.3 g/dL — ABNORMAL LOW (ref 6.5–8.1)

## 2022-02-17 LAB — BRAIN NATRIURETIC PEPTIDE: B Natriuretic Peptide: 496.1 pg/mL — ABNORMAL HIGH (ref 0.0–100.0)

## 2022-02-17 MED ORDER — TORSEMIDE 20 MG PO TABS: 20.0000 mg | ORAL_TABLET | Freq: Every day | ORAL | 3 refills | Status: DC

## 2022-02-17 MED ORDER — POTASSIUM CHLORIDE CRYS ER 20 MEQ PO TBCR: 20.0000 meq | EXTENDED_RELEASE_TABLET | Freq: Every day | ORAL | 3 refills | Status: DC

## 2022-02-17 NOTE — Telephone Encounter (Signed)
H&V Care Navigation CSW Progress Note  Clinical Social Worker informed by clinic staff that pt has no ride to appt today.  CSW called pt to discuss assistance to get him to appt.  Normally schedules rides through his Medicaid but states he was made aware this morning they don't have this appt on their books.   Due to appt being today unable to reschedule rides through other avenues so CSW assisted with arranging an uber to bring pt to appt- pick up at 11am- pt will be able to text to get return ride.   SDOH Screenings   Food Insecurity: No Food Insecurity (03/05/2021)  Housing: Low Risk  (03/05/2021)  Transportation Needs: Unmet Transportation Needs (02/17/2022)  Alcohol Screen: Low Risk  (03/05/2021)  Depression (PHQ2-9): High Risk (12/09/2021)  Financial Resource Strain: Low Risk  (03/05/2021)  Physical Activity: Insufficiently Active (03/05/2021)  Social Connections: Moderately Isolated (03/05/2021)  Stress: Stress Concern Present (03/05/2021)  Tobacco Use: High Risk (01/16/2022)    Jorge Ny, LCSW Clinical Social Worker [facility] Desk#: [phone] Cell#: [phone]

## 2022-02-17 NOTE — Addendum Note (Signed)
Encounter addended by: Jolaine Artist, MD on: 02/17/2022 1:50 PM  Actions taken: Clinical Note Signed, Level of Service modified, Visit diagnoses modified
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
## 2022-02-17 NOTE — Patient Instructions (Signed)
Medication Changes:  Increase Torsemide to 20 mg Daily  Restart Potassium 20 meq Daily  Lab Work:  Labs done today, your results will be available in MyChart, we will contact you for abnormal readings.  Your physician recommends that you return for lab work in: 1-2 weeks  Testing/Procedures:  Your physician has recommended that you have a cardiopulmonary stress test (CPX). CPX testing is a non-invasive measurement of heart and lung function. It replaces a traditional treadmill stress test. This type of test provides a tremendous amount of information that relates not only to your present condition but also for future outcomes. This test combines measurements of you ventilation, respiratory gas exchange in the lungs, electrocardiogram (EKG), blood pressure and physical response before, during, and following an exercise protocol. SEE ATTACHED INSTRUCTION SHEET  Referrals:  none  Special Instructions // Education:  Do the following things EVERYDAY: Weigh yourself in the morning before breakfast. Write it down and keep it in a log. Take your medicines as prescribed Eat low salt foods--Limit salt (sodium) to 2000 mg per day.  Stay as active as you can everyday Limit all fluids for the day to less than 2 liters   Follow-Up in: 3 months (Jan 2024), **PLEASE CALL OUR OFFICE IN NOVEMBER TO SCHEDULE THIS APPOINTMENT   At the [facility], you and your health needs are our priority. We have a designated team specialized in the treatment of Heart Failure. This Care Team includes your primary Heart Failure Specialized Cardiologist (physician), Advanced Practice Providers (APPs- Physician Assistants and Nurse Practitioners), and Pharmacist who all work together to provide you with the care you need, when you need it.   You may see any of the following providers on your designated Care Team at your next follow up:  Dr. Glori Bickers Dr. Loralie Champagne Dr. Roxana Hires, NP Lyda Jester, [facility] [place], [place] Forestine Na, NP Audry Riles, PharmD   Please be sure to bring in all your medications bottles to every appointment.   Need to Contact us:  If you have any questions or concerns before your next appointment please send [place] a message through [place] or call our office at [phone].    TO LEAVE A MESSAGE FOR THE NURSE SELECT OPTION 2, PLEASE LEAVE A MESSAGE INCLUDING: YOUR NAME DATE OF BIRTH CALL BACK NUMBER REASON FOR CALL**this is important as we prioritize the call backs  YOU WILL RECEIVE A CALL BACK THE SAME DAY AS LONG AS YOU CALL BEFORE 4:00 PM

## 2022-02-17 NOTE — Progress Notes (Signed)
ReDS Vest / Clip - 02/17/22 1200       ReDS Vest / Clip   Station Marker D    Ruler Value 36    ReDS Value Range Moderate volume overload    ReDS Actual Value 39

## 2022-02-20 ENCOUNTER — Telehealth: Payer: Self-pay

## 2022-02-20 NOTE — Telephone Encounter (Addendum)
I spoke to Amy/[facility] to inquire if the patient ever received his CPAP.  She stated that they are no longer in network with his [place] and as per 02/14/2022 in his clinical record, the order was sent to [place].   I spoke to [place] / [facility].  She said that their computer system is down and she would need to call me back to confirm that they have received the order from [place]

## 2022-02-25 NOTE — Telephone Encounter (Signed)
I spoke to [employer] who confirmed that they have the order and are working on it.

## 2022-02-26 ENCOUNTER — Ambulatory Visit (HOSPITAL_COMMUNITY)
Admission: RE | Admit: 2022-02-26 | Discharge: 2022-02-26 | Disposition: A | Discharge status: Home / Self Care | Payer: Medicare Other | Source: Ambulatory Visit | Attending: Cardiology | Admitting: Cardiology

## 2022-02-26 DIAGNOSIS — I5022 Chronic systolic (congestive) heart failure: Secondary | ICD-10-CM | POA: Diagnosis not present

## 2022-02-26 LAB — BASIC METABOLIC PANEL
Anion gap: 8 (ref 5–15)
BUN: 15 mg/dL (ref 6–20)
CO2: 23 mmol/L (ref 22–32)
Calcium: 9.7 mg/dL (ref 8.9–10.3)
Chloride: 107 mmol/L (ref 98–111)
Creatinine, Ser: 1.81 mg/dL — ABNORMAL HIGH (ref 0.61–1.24)
GFR, Estimated: 43 mL/min — ABNORMAL LOW (ref >60)
Glucose, Bld: 91 mg/dL (ref 70–99)
Potassium: 4.2 mmol/L (ref 3.5–5.1)
Sodium: 138 mmol/L (ref 135–145)

## 2022-03-10 ENCOUNTER — Other Ambulatory Visit: Payer: Self-pay | Admitting: Critical Care Medicine

## 2022-03-10 DIAGNOSIS — G8929 Other chronic pain: Secondary | ICD-10-CM

## 2022-03-10 DIAGNOSIS — J4520 Mild intermittent asthma, uncomplicated: Secondary | ICD-10-CM

## 2022-03-10 DIAGNOSIS — R200 Anesthesia of skin: Secondary | ICD-10-CM

## 2022-03-10 DIAGNOSIS — M792 Neuralgia and neuritis, unspecified: Secondary | ICD-10-CM

## 2022-03-11 ENCOUNTER — Ambulatory Visit: Payer: Medicare Other | Admitting: Critical Care Medicine

## 2022-03-12 ENCOUNTER — Ambulatory Visit: Payer: Medicare Other | Attending: Critical Care Medicine | Admitting: Physician Assistant

## 2022-03-12 ENCOUNTER — Telehealth: Payer: Self-pay

## 2022-03-12 ENCOUNTER — Encounter: Payer: Self-pay | Admitting: Physician Assistant

## 2022-03-12 VITALS — BP 98/78 | HR 88 | Temp 98.9°F | Ht 68.0 in | Wt 220.0 lb

## 2022-03-12 DIAGNOSIS — I509 Heart failure, unspecified: Secondary | ICD-10-CM

## 2022-03-12 NOTE — Telephone Encounter (Signed)
Message received from [facility] stating that the patient does not have transportation to his appointment at [facility] this afternoon.  I called the patient and he said he scheduled a ride with DSS but that fell through.  He is not sure why because he called DSS about the ride last week and was told he was all set. He was not aware that his [facility] Medicare also provides transportation to medical appointments but he now has that for future reference.  I told him that I would arrange a cab ride for him this afternoon. He confirmed his pick up address.  I spoke to [employer] and arranged pick up for 1420 at patient's home for his appointment at [facility] at 1450. Voucher faxed to [facility].  I spoke to patient and informed him of the pick up time and he said he would be ready   I also inquired if he received his CPAP machine and he said no. I explained to him that the order was sent to [facility] because [facility] was out of network with his insurance.  I provided him with the phone  number for [facility] [phone] , and encouraged him to call to check on the status of the order and he said he would call.
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
## 2022-03-17 ENCOUNTER — Encounter (HOSPITAL_COMMUNITY): Payer: Medicare Other

## 2022-03-20 ENCOUNTER — Other Ambulatory Visit: Payer: Self-pay | Admitting: Critical Care Medicine

## 2022-03-21 ENCOUNTER — Other Ambulatory Visit: Payer: Self-pay | Admitting: Critical Care Medicine

## 2022-03-24 NOTE — Telephone Encounter (Signed)
Unable to refill per protocol, medication was refilled 03/20/22 for 90, will refuse duplicate request.  Requested Prescriptions  Pending Prescriptions Disp Refills   isosorbide-hydrALAZINE (BIDIL) 20-37.5 MG tablet [Pharmacy Med Name: ISOSORB DN-HYDRAL 20-37.5 T 20-37.5 Tablet] 90 tablet 10    Sig: TAKE 1 TABLET BY MOUTH 3 TIMES DAILY *MUST FOLLOW-UP WITH CARDIOLOGY FOR MORE REFILLS*     Cardiovascular:  Vasodilators Failed - 03/21/2022  6:17 PM      Failed - ANA Screen, Ifa, Serum in normal range and within 360 days    No results found for: "ANA", "ANATITER", "LABANTI"       Passed - HCT in normal range and within 360 days    Hematocrit  Date Value Ref Range Status  08/20/2021 40.9 37.5 - 51.0 % Final         Passed - HGB in normal range and within 360 days    Hemoglobin  Date Value Ref Range Status  08/20/2021 13.9 13.0 - 17.7 g/dL Final         Passed - RBC in normal range and within 360 days    RBC  Date Value Ref Range Status  08/20/2021 4.66 4.14 - 5.80 x10E6/uL Final  05/16/2021 4.77 4.22 - 5.81 MIL/uL Final         Passed - WBC in normal range and within 360 days    WBC  Date Value Ref Range Status  08/20/2021 5.2 3.4 - 10.8 x10E3/uL Final  05/16/2021 4.9 4.0 - 10.5 K/uL Final         Passed - PLT in normal range and within 360 days    Platelets  Date Value Ref Range Status  08/20/2021 227 150 - 450 x10E3/uL Final         Passed - Last BP in normal range    BP Readings from Last 1 Encounters:  03/12/22 98/78         Passed - Valid encounter within last 12 months    Recent Outpatient Visits           1 week ago Chronic congestive heart failure, unspecified heart failure type [facility])   [place], [place], [place]   3 months ago Essential hypertension   [place], Patrick E, MD   7 months ago [place] [name], MD   9 months ago  OSA (obstructive sleep apnea)   [place] [name], MD   1 year ago Ischemic cardiomyopathy   [place], MD       Future Appointments             In 3 months [name], MD [place]            Signed Prescriptions Disp Refills   allopurinol (ZYLOPRIM) 100 MG tablet 90 tablet 0    Sig: TAKE 1 TABLET BY MOUTH DAILY     Endocrinology:  Gout Agents - allopurinol Failed - 03/21/2022  6:17 PM      Failed - Uric Acid in normal range and within 360 days    Uric Acid  Date Value Ref Range Status  09/26/2019 8.7 (H) 3.8 - 8.4 mg/dL Final    Comment:               Therapeutic target for gout patients: <  6.0         Failed - Cr in normal range and within 360 days    Creatinine, Ser  Date Value Ref Range Status  02/26/2022 1.81 (H) 0.61 - 1.24 mg/dL Final         Passed - Valid encounter within last 12 months    Recent Outpatient Visits           1 week ago Chronic congestive heart failure, unspecified heart failure type [facility])   [place], [place], [place]   3 months ago Essential hypertension   [place] [name], MD   7 months ago [place] [name], MD   9 months ago OSA (obstructive sleep apnea)   [place] [name], MD   1 year ago Ischemic cardiomyopathy   [place] [name], MD       Future Appointments             In 3 months [name], MD [place] - CBC within normal limits and completed in the last 12 months    WBC  Date Value Ref Range Status  08/20/2021 5.2 3.4 - 10.8 x10E3/uL Final  05/16/2021 4.9 4.0 - 10.5 K/uL Final   RBC  Date Value Ref Range Status   08/20/2021 4.66 4.14 - 5.80 x10E6/uL Final  05/16/2021 4.77 4.22 - 5.81 MIL/uL Final   Hemoglobin  Date Value Ref Range Status  08/20/2021 13.9 13.0 - 17.7 g/dL Final   Hematocrit  Date Value Ref Range Status  08/20/2021 40.9 37.5 - 51.0 % Final   MCHC  Date Value Ref Range Status  08/20/2021 34.0 31.5 - 35.7 g/dL Final  05/16/2021 34.1 30.0 - 36.0 g/dL Final   [facility]  Date Value Ref Range Status  08/20/2021 29.8 26.6 - 33.0 pg Final  05/16/2021 30.0 26.0 - 34.0 pg Final   MCV  Date Value Ref Range Status  08/20/2021 88 79 - 97 fL Final   No results found for: "PLTCOUNTKUC", "LABPLAT", "POCPLA" RDW  Date Value Ref Range Status  08/20/2021 14.5 11.6 - 15.4 % Final

## 2022-03-24 NOTE — Telephone Encounter (Signed)
Requested Prescriptions  Pending Prescriptions Disp Refills   allopurinol (ZYLOPRIM) 100 MG tablet [Pharmacy Med Name: ALLOPURINOL 100 MG TABS 100 Tablet] 30 tablet 10    Sig: TAKE 1 TABLET BY MOUTH DAILY     Endocrinology:  Gout Agents - allopurinol Failed - 03/21/2022  6:17 PM      Failed - Uric Acid in normal range and within 360 days    Uric Acid  Date Value Ref Range Status  09/26/2019 8.7 (H) 3.8 - 8.4 mg/dL Final    Comment:               Therapeutic target for gout patients: <6.0         Failed - Cr in normal range and within 360 days    Creatinine, Ser  Date Value Ref Range Status  02/26/2022 1.81 (H) 0.61 - 1.24 mg/dL Final         Passed - Valid encounter within last 12 months    Recent Outpatient Visits           1 week ago Chronic congestive heart failure, unspecified heart failure type [facility])   [place], [place], [place]   3 months ago Essential hypertension   [place] [name], MD   7 months ago [place] [name], MD   9 months ago OSA (obstructive sleep apnea)   [place] [name], MD   1 year ago Ischemic cardiomyopathy   [place] [name], MD       Future Appointments             In 3 months [name], MD [place] - CBC within normal limits and completed in the last 12 months    WBC  Date Value Ref Range Status  08/20/2021 5.2 3.4 - 10.8 x10E3/uL Final  05/16/2021 4.9 4.0 - 10.5 K/uL Final   RBC  Date Value Ref Range Status  08/20/2021 4.66 4.14 - 5.80 x10E6/uL Final  05/16/2021 4.77 4.22 - 5.81 MIL/uL Final   Hemoglobin  Date Value Ref Range Status  08/20/2021 13.9 13.0 - 17.7 g/dL Final   Hematocrit  Date Value Ref Range Status  08/20/2021 40.9 37.5 - 51.0 % Final    MCHC  Date Value Ref Range Status  08/20/2021 34.0 31.5 - 35.7 g/dL Final  05/16/2021 34.1 30.0 - 36.0 g/dL Final   [facility]  Date Value Ref Range Status  08/20/2021 29.8 26.6 - 33.0 pg Final  05/16/2021 30.0 26.0 - 34.0 pg Final   MCV  Date Value Ref Range Status  08/20/2021 88 79 - 97 fL Final   No results found for: "PLTCOUNTKUC", "LABPLAT", "POCPLA" RDW  Date Value Ref Range Status  08/20/2021 14.5 11.6 - 15.4 % Final          isosorbide-hydrALAZINE (BIDIL) 20-37.5 MG tablet [Pharmacy Med Name: ISOSORB DN-HYDRAL 20-37.5 T 20-37.5 Tablet] 90 tablet 10    Sig: TAKE 1 TABLET BY MOUTH 3 TIMES DAILY *MUST FOLLOW-UP WITH CARDIOLOGY FOR MORE REFILLS*     Cardiovascular:  Vasodilators Failed - 03/21/2022  6:17 PM      Failed - ANA Screen, Ifa, Serum in normal range and within 360 days    No results found for: "  ANA", "ANATITER", "LABANTI"       Passed - HCT in normal range and within 360 days    Hematocrit  Date Value Ref Range Status  08/20/2021 40.9 37.5 - 51.0 % Final         Passed - HGB in normal range and within 360 days    Hemoglobin  Date Value Ref Range Status  08/20/2021 13.9 13.0 - 17.7 g/dL Final         Passed - RBC in normal range and within 360 days    RBC  Date Value Ref Range Status  08/20/2021 4.66 4.14 - 5.80 x10E6/uL Final  05/16/2021 4.77 4.22 - 5.81 MIL/uL Final         Passed - WBC in normal range and within 360 days    WBC  Date Value Ref Range Status  08/20/2021 5.2 3.4 - 10.8 x10E3/uL Final  05/16/2021 4.9 4.0 - 10.5 K/uL Final         Passed - PLT in normal range and within 360 days    Platelets  Date Value Ref Range Status  08/20/2021 227 150 - 450 x10E3/uL Final         Passed - Last BP in normal range    BP Readings from Last 1 Encounters:  03/12/22 98/78         Passed - Valid encounter within last 12 months    Recent Outpatient Visits           1 week ago Chronic congestive heart failure, unspecified heart failure type  [facility])   [place], [place], [place]   3 months ago Essential hypertension   [place], [place]   7 months ago [place], MD   9 months ago OSA (obstructive sleep apnea)   [place], Patrick E, MD   1 year ago Ischemic cardiomyopathy   [place], MD       Future Appointments             In 3 months [name] [place] Harry, MD [place]

## 2022-03-31 ENCOUNTER — Encounter: Payer: Self-pay | Admitting: Critical Care Medicine

## 2022-03-31 NOTE — Telephone Encounter (Signed)
DJ and Opal Sidles see this pts request for a list of bills dating to 2022  pls see if can be addressed

## 2022-04-01 ENCOUNTER — Telehealth: Payer: Self-pay | Admitting: Critical Care Medicine

## 2022-04-01 NOTE — Telephone Encounter (Signed)
Copied from [place] [phone]. Topic: General - Other >> Apr 01, 2022  2:19 PM Chapman Fitch wrote: Reason for CRM: Pt called to see if the [facility] form he dropped of last Tuesday was completed and ready for pick up / pt was advised of Dr. [name] just returning for leave / he stated this was urgent ly needed and he advised office of this when dropping off / pt states he has less than 72 hrs to get this information submitted before losing benefits / please advise asap

## 2022-04-01 NOTE — Telephone Encounter (Signed)
I called patient and he explained that the form is needed by [facility] so he can continue with his special care needs at home.  He said that the completed form can be faxed to [facility] but he was not sure of the recommended level of care that needs to be documented. I explained to him that I will call [facility] in the morning and confirm what level of care is to be documented.

## 2022-04-02 NOTE — Telephone Encounter (Signed)
I spoke to Cody Guzman and she explained that the [facility] is for DSS and his special assistance in home services.  She stated that the recommended level of care is domiciliary and the medication list can be attached to the form. The provider's NPI must be on the form.  She confirmed that the completed FL2 can be faxed to her office- fax # [phone].   I called patient to inform him that I spoke to Lea. Message left for him with call back requested.

## 2022-04-02 NOTE — Telephone Encounter (Signed)
[name] calling to follow up on the FL2.  Please call her at [phone].  This is direct number.

## 2022-04-02 NOTE — Telephone Encounter (Signed)
Completed FL2 faxed to [employer]- attn: Lea.   I called patient to inform him that the [facility] has been sent to [facility] and I needed to leave a message requesting a call back.

## 2022-04-03 ENCOUNTER — Ambulatory Visit: Payer: Medicare Other | Attending: Internal Medicine | Admitting: Internal Medicine

## 2022-04-03 ENCOUNTER — Encounter: Payer: Self-pay | Admitting: Internal Medicine

## 2022-04-03 VITALS — BP 114/76 | HR 74 | Ht 68.0 in | Wt 220.2 lb

## 2022-04-03 DIAGNOSIS — I5022 Chronic systolic (congestive) heart failure: Secondary | ICD-10-CM

## 2022-04-03 DIAGNOSIS — I4892 Unspecified atrial flutter: Secondary | ICD-10-CM

## 2022-04-03 DIAGNOSIS — I472 Ventricular tachycardia, unspecified: Secondary | ICD-10-CM | POA: Diagnosis not present

## 2022-04-03 DIAGNOSIS — Z01812 Encounter for preprocedural laboratory examination: Secondary | ICD-10-CM

## 2022-04-03 DIAGNOSIS — I425 Other restrictive cardiomyopathy: Secondary | ICD-10-CM

## 2022-04-03 NOTE — Patient Instructions (Signed)
Medication Instructions:  Your physician recommends that you continue on your current medications as directed. Please refer to the Current Medication list given to you today.  *If you need a refill on your cardiac medications before your next appointment, please call your pharmacy*   Lab Work: None today If you have labs (blood work) drawn today and your tests are completely normal, you will receive your results only by: [place] (if you have MyChart) OR A paper copy in the mail If you have any lab test that is abnormal or we need to change your treatment, we will call you to review the results.   Testing/Procedures: Your physician has recommended that you have a defibrillator inserted. An implantable cardioverter defibrillator (ICD) is a small device that is placed in your chest or, in rare cases, your abdomen. This device uses electrical pulses or shocks to help control life-threatening, irregular heartbeats that could lead the heart to suddenly stop beating (sudden cardiac arrest). Leads are attached to the ICD that goes into your heart. This is done in the hospital and usually requires an overnight stay. Please see the instruction sheet given to you today for more information.  CRT or cardiac resynchronization therapy is a treatment used to correct heart failure. When you have heart failure your heart is weakened and doesn't pump as well as it should. This therapy may help reduce symptoms and improve the quality of life.  Please see the handout/brochure given to you today to get more information of the different options of therapy.  Your physician has recommended that you have a Cardioversion (DCCV). Electrical Cardioversion uses a jolt of electricity to your heart either through paddles or wired patches attached to your chest. This is a controlled, usually prescheduled, procedure. Defibrillation is done under light anesthesia in the hospital, and you usually go home the day of the  procedure. This is done to get your heart back into a normal rhythm. You are not awake for the procedure. Please see the instruction sheet given to you today.     Follow-Up: At [facility], you and your health needs are our priority.  As part of our continuing mission to provide you with exceptional heart care, we have created designated Provider Care Teams.  These Care Teams include your primary Cardiologist (physician) and Advanced Practice Providers (APPs -  Physician Assistants and Nurse Practitioners) who all work together to provide you with the care you need, when you need it.  We recommend signing up for the patient portal called "MyChart".  Sign up information is provided on this After Visit Summary.  MyChart is used to connect with patients for Virtual Visits (Telemedicine).  Patients are able to view lab/test results, encounter notes, upcoming appointments, etc.  Non-urgent messages can be sent to your provider as well.   To learn more about what you can do with MyChart, go to [email or web address].    Your next appointment:   To be scheduled  Important Information About Sugar
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
## 2022-04-03 NOTE — Telephone Encounter (Signed)
I spoke to Cody Guzman/[employer] and she confirmed receipt of the FL2

## 2022-04-08 ENCOUNTER — Telehealth: Payer: Self-pay | Admitting: *Deleted

## 2022-04-08 NOTE — Telephone Encounter (Signed)
Recieved a call from [place] with [employer] requesting a APAP order for this patient. Karna Christmas stated that she has "everything but a order."  Looks like the order was sent in February. I asked why they are just now getting to it and she replied that she is the only one working on the orders and is still trying to "catch up" from the recall.

## 2022-04-10 ENCOUNTER — Other Ambulatory Visit: Payer: Self-pay | Admitting: Critical Care Medicine

## 2022-04-10 DIAGNOSIS — I10 Essential (primary) hypertension: Secondary | ICD-10-CM

## 2022-04-10 DIAGNOSIS — I509 Heart failure, unspecified: Secondary | ICD-10-CM

## 2022-04-11 NOTE — Telephone Encounter (Signed)
Requested medication (s) are due for refill today: yes  Requested medication (s) are on the active medication list: yes  Last refill:  06/10/21  Future visit scheduled: yes  Notes to clinic:  Unable to refill per protocol, cannot delegate.      Requested Prescriptions  Pending Prescriptions Disp Refills   amiodarone (PACERONE) 200 MG tablet [Pharmacy Med Name: AMIODARONE 200MG TAB 200 Tablet] 30 tablet 10    Sig: TAKE 1 TABLET BY MOUTH DAILY     Not Delegated - Cardiovascular: Antiarrhythmic Agents - amiodarone Failed - 04/10/2022  6:37 PM      Failed - This refill cannot be delegated      Failed - Manual Review: Eye exam recommended every 12 months      Failed - Mg Level in normal range and within 360 days    Magnesium  Date Value Ref Range Status  08/30/2020 1.7 1.7 - 2.4 mg/dL Final    Comment:    Performed at [facility], [place] [address] [place], [place] [address]         Failed - Patient had chest x-ray within the last 6 months      Passed - TSH in normal range and within 360 days    TSH  Date Value Ref Range Status  05/16/2021 3.279 0.350 - 4.500 uIU/mL Final    Comment:    Performed by a 3rd Generation assay with a functional sensitivity of <=0.01 uIU/mL. Performed at [facility], [place] [address] [place], [place] [address]          Passed - K in normal range and within 180 days    Potassium  Date Value Ref Range Status  02/26/2022 4.2 3.5 - 5.1 mmol/L Final         Passed - AST in normal range and within 180 days    AST  Date Value Ref Range Status  02/17/2022 17 15 - 41 U/L Final         Passed - ALT in normal range and within 180 days    ALT  Date Value Ref Range Status  02/17/2022 12 0 - 44 U/L Final         Passed - Patient had ECG in the last 180 days      Passed - Patient is not pregnant      Passed - Last BP in normal range    BP Readings from Last 1 Encounters:  04/03/22 114/76         Passed - Last Heart Rate in normal  range    Pulse Readings from Last 1 Encounters:  04/03/22 74         Passed - Valid encounter within last 6 months    Recent Outpatient Visits           1 month ago Chronic congestive heart failure, unspecified heart failure type [facility])   [place], [place], [place]   4 months ago Essential hypertension   [place], MD   7 months ago [place], MD   10 months ago OSA (obstructive sleep apnea)   [place] [name], MD   1 year ago Ischemic cardiomyopathy   [place] [name], MD       Future Appointments  In 3 months [name], MD [place]            Signed Prescriptions Disp Refills   atorvastatin (LIPITOR) 80 MG tablet 90 tablet 0    Sig: TAKE 1 TABLET BY MOUTH DAILY     Cardiovascular:  Antilipid - Statins Failed - 04/10/2022  6:37 PM      Failed - Lipid Panel in normal range within the last 12 months    Cholesterol, Total  Date Value Ref Range Status  08/24/2018 133 100 - 199 mg/dL Final   Cholesterol  Date Value Ref Range Status  08/27/2020 168 0 - 200 mg/dL Final   LDL Calculated  Date Value Ref Range Status  08/24/2018 76 0 - 99 mg/dL Final   LDL Cholesterol  Date Value Ref Range Status  08/27/2020 120 (H) 0 - 99 mg/dL Final    Comment:           Total Cholesterol/HDL:CHD Risk Coronary Heart Disease Risk Table                     Men   Women  1/2 Average Risk   3.4   3.3  Average Risk       5.0   4.4  2 X Average Risk   9.6   7.1  3 X Average Risk  23.4   11.0        Use the calculated Patient Ratio above and the CHD Risk Table to determine the patient's CHD Risk.        ATP III CLASSIFICATION (LDL):  <100     mg/dL   Optimal  100-129  mg/dL   Near or Above                     Optimal  130-159  mg/dL   Borderline  160-189  mg/dL   High  >190     mg/dL   Very High Performed at [facility] [address], [place], [place] [address]    HDL  Date Value Ref Range Status  08/27/2020 36 (L) >40 mg/dL Final  08/24/2018 45 >39 mg/dL Final   Triglycerides  Date Value Ref Range Status  08/27/2020 59 <150 mg/dL Final         Passed - Patient is not pregnant      Passed - Valid encounter within last 12 months    Recent Outpatient Visits           1 month ago Chronic congestive heart failure, unspecified heart failure type [facility])   [place], [place], [place]   4 months ago Essential hypertension   [place], MD   7 months ago [place] [name], MD   10 months ago OSA (obstructive sleep apnea)   [place] [name], MD   1 year ago Ischemic cardiomyopathy   [place], MD       Future Appointments             In 3 months [name], MD [place]             spironolactone (ALDACTONE) 25 MG tablet 45 tablet 0    Sig: TAKE 1/2 TABLET BY MOUTH  DAILY     Cardiovascular: Diuretics - Aldosterone Antagonist Failed - 04/10/2022  6:37 PM      Failed - Cr in normal range and within 180 days    Creatinine, Ser  Date Value Ref Range Status  02/26/2022 1.81 (H) 0.61 - 1.24 mg/dL Final         Passed - K in normal range and within 180 days    Potassium  Date Value Ref Range Status  02/26/2022 4.2 3.5 - 5.1 mmol/L Final         Passed - Na in normal range and within 180 days    Sodium  Date Value Ref Range Status  02/26/2022 138 135 - 145 mmol/L Final  08/20/2021 140 134 - 144 mmol/L Final         Passed - eGFR is 30 or above and within 180 days    GFR calc Af Amer   Date Value Ref Range Status  09/26/2019 79 >59 mL/min/1.73 Final    Comment:    **[facility] currently reports eGFR in compliance with the current**   recommendations of the [employer]. [facility] will   update reporting as new guidelines are published from the NKF-ASN   Task force.    GFR, Estimated  Date Value Ref Range Status  02/26/2022 43 (L) >60 mL/min Final    Comment:    (NOTE) Calculated using the CKD-EPI Creatinine Equation (2021)    eGFR  Date Value Ref Range Status  08/20/2021 57 (L) >59 mL/min/1.73 Final         Passed - Last BP in normal range    BP Readings from Last 1 Encounters:  04/03/22 114/76         Passed - Valid encounter within last 6 months    Recent Outpatient Visits           1 month ago Chronic congestive heart failure, unspecified heart failure type [facility])   [place], [place], [place]   4 months ago Essential hypertension   [place], MD   7 months ago [place] [name], MD   10 months ago OSA (obstructive sleep apnea)   [place], Patrick E, MD   1 year ago Ischemic cardiomyopathy   [place], MD       Future Appointments             In 3 months [name] [place], MD [place]

## 2022-04-11 NOTE — Telephone Encounter (Signed)
Requested Prescriptions  Pending Prescriptions Disp Refills   atorvastatin (LIPITOR) 80 MG tablet [Pharmacy Med Name: ATORVASTATIN 80MG TAB 80 Tablet] 90 tablet 0    Sig: TAKE 1 TABLET BY MOUTH DAILY     Cardiovascular:  Antilipid - Statins Failed - 04/10/2022  6:37 PM      Failed - Lipid Panel in normal range within the last 12 months    Cholesterol, Total  Date Value Ref Range Status  08/24/2018 133 100 - 199 mg/dL Final   Cholesterol  Date Value Ref Range Status  08/27/2020 168 0 - 200 mg/dL Final   LDL Calculated  Date Value Ref Range Status  08/24/2018 76 0 - 99 mg/dL Final   LDL Cholesterol  Date Value Ref Range Status  08/27/2020 120 (H) 0 - 99 mg/dL Final    Comment:           Total Cholesterol/HDL:CHD Risk Coronary Heart Disease Risk Table                     Men   Women  1/2 Average Risk   3.4   3.3  Average Risk       5.0   4.4  2 X Average Risk   9.6   7.1  3 X Average Risk  23.4   11.0        Use the calculated Patient Ratio above and the CHD Risk Table to determine the patient's CHD Risk.        ATP III CLASSIFICATION (LDL):  <100     mg/dL   Optimal  100-129  mg/dL   Near or Above                    Optimal  130-159  mg/dL   Borderline  160-189  mg/dL   High  >190     mg/dL   Very High Performed at [facility] [address] [place], [place] [address]    HDL  Date Value Ref Range Status  08/27/2020 36 (L) >40 mg/dL Final  08/24/2018 45 >39 mg/dL Final   Triglycerides  Date Value Ref Range Status  08/27/2020 59 <150 mg/dL Final         Passed - Patient is not pregnant      Passed - Valid encounter within last 12 months    Recent Outpatient Visits           1 month ago Chronic congestive heart failure, unspecified heart failure type [facility])   [place], [place], [place]   4 months ago Essential hypertension   [place], MD   7 months ago  [place], MD   10 months ago OSA (obstructive sleep apnea)   [place], MD   1 year ago Ischemic cardiomyopathy   [place], MD       Future Appointments             In 3 months [name], MD [place]             spironolactone (ALDACTONE) 25 MG tablet [Pharmacy Med Name: SPIRONOLACTONE 25 MG TABS 25 Tablet] 45 tablet 0    Sig: TAKE 1/2 TABLET BY MOUTH DAILY  Cardiovascular: Diuretics - Aldosterone Antagonist Failed - 04/10/2022  6:37 PM      Failed - Cr in normal range and within 180 days    Creatinine, Ser  Date Value Ref Range Status  02/26/2022 1.81 (H) 0.61 - 1.24 mg/dL Final         Passed - K in normal range and within 180 days    Potassium  Date Value Ref Range Status  02/26/2022 4.2 3.5 - 5.1 mmol/L Final         Passed - Na in normal range and within 180 days    Sodium  Date Value Ref Range Status  02/26/2022 138 135 - 145 mmol/L Final  08/20/2021 140 134 - 144 mmol/L Final         Passed - eGFR is 30 or above and within 180 days    GFR calc Af Amer  Date Value Ref Range Status  09/26/2019 79 >59 mL/min/1.73 Final    Comment:    **[facility] currently reports eGFR in compliance with the current**   recommendations of the [employer]. [facility] will   update reporting as new guidelines are published from the NKF-ASN   Task force.    GFR, Estimated  Date Value Ref Range Status  02/26/2022 43 (L) >60 mL/min Final    Comment:    (NOTE) Calculated using the CKD-EPI Creatinine Equation (2021)    eGFR  Date Value Ref Range Status  08/20/2021 57 (L) >59 mL/min/1.73 Final         Passed - Last BP in normal range    BP Readings from Last 1 Encounters:  04/03/22 114/76         Passed - Valid encounter within last 6 months    Recent  Outpatient Visits           1 month ago Chronic congestive heart failure, unspecified heart failure type [facility])   [place], [place], [place]   4 months ago Essential hypertension   [place], MD   7 months ago [place] [name], MD   10 months ago OSA (obstructive sleep apnea)   [place], Patrick E, MD   1 year ago Ischemic cardiomyopathy   [place], MD       Future Appointments             In 3 months [name], MD [place]             amiodarone (PACERONE) 200 MG tablet [Pharmacy Med Name: AMIODARONE 200MG TAB 200 Tablet] 30 tablet 10    Sig: TAKE 1 TABLET BY MOUTH DAILY     Not Delegated - Cardiovascular: Antiarrhythmic Agents - amiodarone Failed - 04/10/2022  6:37 PM      Failed - This refill cannot be delegated      Failed - Manual Review: Eye exam recommended every 12 months      Failed - Mg Level in normal range and within 360 days    Magnesium  Date Value Ref Range Status  08/30/2020 1.7 1.7 - 2.4 mg/dL Final    Comment:    Performed at [facility], [place] [address] [place], [place] [address]         Failed - Patient had chest  x-ray within the last 6 months      Passed - TSH in normal range and within 360 days    TSH  Date Value Ref Range Status  05/16/2021 3.279 0.350 - 4.500 uIU/mL Final    Comment:    Performed by a 3rd Generation assay with a functional sensitivity of <=0.01 uIU/mL. Performed at [facility], [place] [address], [place], [place] [address]          Passed - K in normal range and within 180 days    Potassium  Date Value Ref Range Status  02/26/2022 4.2 3.5 - 5.1 mmol/L Final         Passed - AST in normal range and within 180 days    AST  Date Value Ref  Range Status  02/17/2022 17 15 - 41 U/L Final         Passed - ALT in normal range and within 180 days    ALT  Date Value Ref Range Status  02/17/2022 12 0 - 44 U/L Final         Passed - Patient had ECG in the last 180 days      Passed - Patient is not pregnant      Passed - Last BP in normal range    BP Readings from Last 1 Encounters:  04/03/22 114/76         Passed - Last Heart Rate in normal range    Pulse Readings from Last 1 Encounters:  04/03/22 74         Passed - Valid encounter within last 6 months    Recent Outpatient Visits           1 month ago Chronic congestive heart failure, unspecified heart failure type [facility])   [place], [place], [place]   4 months ago Essential hypertension   [place], MD   7 months ago [place], MD   10 months ago OSA (obstructive sleep apnea)   [place], Patrick E, MD   1 year ago Ischemic cardiomyopathy   [place], MD       Future Appointments             In 3 months [name] Burn[place] Harry, MD [place]

## 2022-04-17 ENCOUNTER — Telehealth: Payer: Self-pay

## 2022-04-17 NOTE — Telephone Encounter (Signed)
I spoke to Cody Guzman/ [facility] who said that the order for the CPAP machine has been processed and they have been trying to reach the patient to schedule set up.   I called the patient and explained that [facility] is trying to reach him to set up his CPAP machine. I provided him the phone number for [facility] [phone] and instructed him to call them.  He repeated the number back correctly and said he would call them today.

## 2022-04-21 ENCOUNTER — Telehealth: Payer: Self-pay | Admitting: Emergency Medicine
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
## 2022-04-21 NOTE — Telephone Encounter (Signed)
Copied from [place] [phone]. Topic: General - Inquiry >> Apr 21, 2022  1:34 PM Marcellus Scott wrote: Reason for CRM: Morene Antu NP with [facility] She stated she does house calls and would like to know if pt A1c was checked.  Please advise.

## 2022-04-23 ENCOUNTER — Encounter (HOSPITAL_COMMUNITY): Payer: Self-pay

## 2022-04-23 ENCOUNTER — Ambulatory Visit (HOSPITAL_COMMUNITY)
Admission: RE | Admit: 2022-04-23 | Discharge: 2022-04-23 | Disposition: A | Discharge status: Home / Self Care | Payer: Medicare Other | Source: Ambulatory Visit | Attending: Family Medicine | Admitting: Family Medicine

## 2022-04-23 VITALS — BP 102/88 | HR 85 | Ht 68.0 in | Wt 228.0 lb

## 2022-04-23 DIAGNOSIS — I340 Nonrheumatic mitral (valve) insufficiency: Secondary | ICD-10-CM | POA: Insufficient documentation

## 2022-04-23 DIAGNOSIS — N183 Chronic kidney disease, stage 3 unspecified: Secondary | ICD-10-CM | POA: Diagnosis not present

## 2022-04-23 DIAGNOSIS — I2510 Atherosclerotic heart disease of native coronary artery without angina pectoris: Secondary | ICD-10-CM | POA: Diagnosis not present

## 2022-04-23 DIAGNOSIS — I5082 Biventricular heart failure: Secondary | ICD-10-CM | POA: Insufficient documentation

## 2022-04-23 DIAGNOSIS — I4892 Unspecified atrial flutter: Secondary | ICD-10-CM | POA: Insufficient documentation

## 2022-04-23 DIAGNOSIS — I5022 Chronic systolic (congestive) heart failure: Secondary | ICD-10-CM

## 2022-04-23 DIAGNOSIS — N182 Chronic kidney disease, stage 2 (mild): Secondary | ICD-10-CM | POA: Diagnosis not present

## 2022-04-23 DIAGNOSIS — I25119 Atherosclerotic heart disease of native coronary artery with unspecified angina pectoris: Secondary | ICD-10-CM | POA: Diagnosis not present

## 2022-04-23 DIAGNOSIS — Z9221 Personal history of antineoplastic chemotherapy: Secondary | ICD-10-CM | POA: Insufficient documentation

## 2022-04-23 DIAGNOSIS — Z8572 Personal history of non-Hodgkin lymphomas: Secondary | ICD-10-CM | POA: Diagnosis not present

## 2022-04-23 DIAGNOSIS — G4733 Obstructive sleep apnea (adult) (pediatric): Secondary | ICD-10-CM | POA: Diagnosis not present

## 2022-04-23 DIAGNOSIS — Z7901 Long term (current) use of anticoagulants: Secondary | ICD-10-CM | POA: Insufficient documentation

## 2022-04-23 LAB — BASIC METABOLIC PANEL
Anion gap: 10 (ref 5–15)
BUN: 8 mg/dL (ref 6–20)
CO2: 23 mmol/L (ref 22–32)
Calcium: 9.1 mg/dL (ref 8.9–10.3)
Chloride: 106 mmol/L (ref 98–111)
Creatinine, Ser: 1.56 mg/dL — ABNORMAL HIGH (ref 0.61–1.24)
GFR, Estimated: 51 mL/min — ABNORMAL LOW (ref >60)
Glucose, Bld: 115 mg/dL — ABNORMAL HIGH (ref 70–99)
Potassium: 3.2 mmol/L — ABNORMAL LOW (ref 3.5–5.1)
Sodium: 139 mmol/L (ref 135–145)

## 2022-04-23 LAB — BRAIN NATRIURETIC PEPTIDE: B Natriuretic Peptide: 468.5 pg/mL — ABNORMAL HIGH (ref 0.0–100.0)

## 2022-04-23 NOTE — Patient Instructions (Signed)
Thank you for coming in today  Labs were done today, if any labs are abnormal the clinic will call you No news is good news  STOP [place]  Your physician recommends that you schedule a follow-up appointment in: 3 months with Dr. Haroldine Laws    Do the following things EVERYDAY: Weigh yourself in the morning before breakfast. Write it down and keep it in a log. Take your medicines as prescribed Eat low salt foods--Limit salt (sodium) to 2000 mg per day.  Stay as active as you can everyday Limit all fluids for the day to less than 2 liters  At the [facility], you and your health needs are our priority. As part of our continuing mission to provide you with exceptional heart care, we have created designated Provider Care Teams. These Care Teams include your primary Cardiologist (physician) and Advanced Practice Providers (APPs- Physician Assistants and Nurse Practitioners) who all work together to provide you with the care you need, when you need it.   You may see any of the following providers on your designated Care Team at your next follow up: Dr Glori Bickers Dr Loralie Champagne Dr. Roxana Hires, NP Lyda Jester, [place] [facility] [place], [place] Forestine Na, NP Audry Riles, PharmD   Please be sure to bring in all your medications bottles to every appointment.   If you have any questions or concerns before your next appointment please send [place] a message through [place] or call our office at [phone].    TO LEAVE A MESSAGE FOR THE NURSE SELECT OPTION 2, PLEASE LEAVE A MESSAGE INCLUDING: YOUR NAME DATE OF BIRTH CALL BACK NUMBER REASON FOR CALL**this is important as we prioritize the call backs  YOU WILL RECEIVE A CALL BACK THE SAME DAY AS LONG AS YOU CALL BEFORE 4:00 PM

## 2022-04-24 ENCOUNTER — Other Ambulatory Visit (HOSPITAL_COMMUNITY): Payer: Self-pay

## 2022-04-24 ENCOUNTER — Telehealth (HOSPITAL_COMMUNITY): Payer: Self-pay

## 2022-04-24 DIAGNOSIS — I10 Essential (primary) hypertension: Secondary | ICD-10-CM

## 2022-04-24 DIAGNOSIS — I509 Heart failure, unspecified: Secondary | ICD-10-CM

## 2022-04-24 MED ORDER — SPIRONOLACTONE 25 MG PO TABS
25.0000 mg | ORAL_TABLET | Freq: Every day | ORAL | 0 refills | Status: DC
  Filled 2022-04-24 – 2022-08-11 (×2): qty 45, 45d supply, fill #0

## 2022-04-24 NOTE — Telephone Encounter (Signed)
Patient aware and agreeable. He is having labs done on 1/12 and cannot do sooner.

## 2022-04-25 NOTE — Telephone Encounter (Signed)
Called and left vm  °

## 2022-04-30 ENCOUNTER — Ambulatory Visit: Payer: Medicare Other | Attending: Family Medicine | Admitting: Critical Care Medicine

## 2022-04-30 DIAGNOSIS — Z0000 Encounter for general adult medical examination without abnormal findings: Secondary | ICD-10-CM
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
## 2022-05-02 ENCOUNTER — Telehealth: Payer: Self-pay

## 2022-05-02 NOTE — Telephone Encounter (Signed)
LM for pt to call back to get his procedure moved due to Dr Caryl Comes having surgery

## 2022-05-09 ENCOUNTER — Other Ambulatory Visit (HOSPITAL_COMMUNITY): Payer: Self-pay

## 2022-05-16 ENCOUNTER — Other Ambulatory Visit: Payer: Medicaid Other

## 2022-05-19 ENCOUNTER — Encounter: Payer: Self-pay | Admitting: Internal Medicine

## 2022-05-29 ENCOUNTER — Ambulatory Visit: Payer: Medicare HMO | Attending: Internal Medicine

## 2022-05-29 DIAGNOSIS — I5022 Chronic systolic (congestive) heart failure: Secondary | ICD-10-CM

## 2022-05-29 DIAGNOSIS — I472 Ventricular tachycardia, unspecified: Secondary | ICD-10-CM

## 2022-05-30 LAB — BASIC METABOLIC PANEL
BUN/Creatinine Ratio: 6 — ABNORMAL LOW (ref 9–20)
BUN: 9 mg/dL (ref 6–24)
CO2: 21 mmol/L (ref 20–29)
Calcium: 10.3 mg/dL — ABNORMAL HIGH (ref 8.7–10.2)
Chloride: 104 mmol/L (ref 96–106)
Creatinine, Ser: 1.39 mg/dL — ABNORMAL HIGH (ref 0.76–1.27)
Glucose: 95 mg/dL (ref 70–99)
Potassium: 4.3 mmol/L (ref 3.5–5.2)
Sodium: 141 mmol/L (ref 134–144)
eGFR: 59 mL/min/{1.73_m2} — ABNORMAL LOW (ref >59)

## 2022-05-30 LAB — CBC
Hematocrit: 47.2 % (ref 37.5–51.0)
Hemoglobin: 15.6 g/dL (ref 13.0–17.7)
MCH: 30.1 pg (ref 26.6–33.0)
MCHC: 33.1 g/dL (ref 31.5–35.7)
MCV: 91 fL (ref 79–97)
Platelets: 256 10*3/uL (ref 150–450)
RBC: 5.19 x10E6/uL (ref 4.14–5.80)
RDW: 13.5 % (ref 11.6–15.4)
WBC: 4.7 10*3/uL (ref 3.4–10.8)

## 2022-06-04 NOTE — Pre-Procedure Instructions (Signed)
Instructed patient on the following items: Arrival time 0530 Nothing to eat or drink after midnight No meds AM of procedure Responsible person to drive you home and stay with you for 24 hrs Wash with special soap night before and morning of procedure If on anti-coagulant drug instructions Eliquis- last dose 1/30.

## 2022-06-05 ENCOUNTER — Ambulatory Visit (HOSPITAL_COMMUNITY): Payer: Medicare HMO

## 2022-06-05 ENCOUNTER — Encounter (HOSPITAL_COMMUNITY)
Admission: RE | Disposition: A | Discharge status: Home / Self Care | Payer: Self-pay | Source: Home / Self Care | Attending: Internal Medicine

## 2022-06-05 ENCOUNTER — Ambulatory Visit (HOSPITAL_COMMUNITY)
Admission: RE | Admit: 2022-06-05 | Discharge: 2022-06-05 | Disposition: A | Discharge status: Home / Self Care | Payer: Medicare HMO | Attending: Internal Medicine | Admitting: Internal Medicine

## 2022-06-05 ENCOUNTER — Ambulatory Visit: Payer: Medicare Other

## 2022-06-05 ENCOUNTER — Other Ambulatory Visit: Payer: Self-pay

## 2022-06-05 DIAGNOSIS — Z9221 Personal history of antineoplastic chemotherapy: Secondary | ICD-10-CM | POA: Insufficient documentation

## 2022-06-05 DIAGNOSIS — I255 Ischemic cardiomyopathy: Secondary | ICD-10-CM

## 2022-06-05 DIAGNOSIS — I4892 Unspecified atrial flutter: Secondary | ICD-10-CM | POA: Diagnosis not present

## 2022-06-05 DIAGNOSIS — I452 Bifascicular block: Secondary | ICD-10-CM | POA: Insufficient documentation

## 2022-06-05 DIAGNOSIS — Z79899 Other long term (current) drug therapy: Secondary | ICD-10-CM | POA: Insufficient documentation

## 2022-06-05 DIAGNOSIS — F17210 Nicotine dependence, cigarettes, uncomplicated: Secondary | ICD-10-CM | POA: Insufficient documentation

## 2022-06-05 DIAGNOSIS — I252 Old myocardial infarction: Secondary | ICD-10-CM | POA: Diagnosis not present

## 2022-06-05 DIAGNOSIS — I428 Other cardiomyopathies: Secondary | ICD-10-CM | POA: Insufficient documentation

## 2022-06-05 DIAGNOSIS — I4510 Unspecified right bundle-branch block: Secondary | ICD-10-CM | POA: Diagnosis present

## 2022-06-05 DIAGNOSIS — I5022 Chronic systolic (congestive) heart failure: Secondary | ICD-10-CM | POA: Diagnosis not present

## 2022-06-05 DIAGNOSIS — I2510 Atherosclerotic heart disease of native coronary artery without angina pectoris: Secondary | ICD-10-CM | POA: Diagnosis not present

## 2022-06-05 DIAGNOSIS — N182 Chronic kidney disease, stage 2 (mild): Secondary | ICD-10-CM | POA: Diagnosis not present

## 2022-06-05 DIAGNOSIS — I429 Cardiomyopathy, unspecified: Secondary | ICD-10-CM | POA: Diagnosis not present

## 2022-06-05 DIAGNOSIS — G4733 Obstructive sleep apnea (adult) (pediatric): Secondary | ICD-10-CM | POA: Diagnosis not present

## 2022-06-05 DIAGNOSIS — Z8673 Personal history of transient ischemic attack (TIA), and cerebral infarction without residual deficits: Secondary | ICD-10-CM | POA: Insufficient documentation

## 2022-06-05 DIAGNOSIS — Z955 Presence of coronary angioplasty implant and graft: Secondary | ICD-10-CM | POA: Diagnosis not present

## 2022-06-05 HISTORY — PX: BIV ICD INSERTION CRT-D: EP1195

## 2022-06-05 SURGERY — BIV ICD INSERTION CRT-D

## 2022-06-05 MED ORDER — SODIUM CHLORIDE 0.9 % IV SOLN
80.0000 mg | INTRAVENOUS | Status: AC
  Administered 2022-06-05 (×2): 80 mg

## 2022-06-05 MED ORDER — FENTANYL CITRATE (PF) 100 MCG/2ML IJ SOLN
INTRAMUSCULAR | Status: AC
  Filled 2022-06-05: qty 2

## 2022-06-05 MED ORDER — MIDAZOLAM HCL 5 MG/5ML IJ SOLN
INTRAMUSCULAR | Status: DC | PRN
  Administered 2022-06-05: 1 mg via INTRAVENOUS
  Administered 2022-06-05: 2 mg via INTRAVENOUS

## 2022-06-05 MED ORDER — FENTANYL CITRATE (PF) 100 MCG/2ML IJ SOLN
INTRAMUSCULAR | Status: DC | PRN
  Administered 2022-06-05 (×7): 25 ug via INTRAVENOUS

## 2022-06-05 MED ORDER — SODIUM CHLORIDE 0.9 % IV SOLN: INTRAVENOUS | Status: DC

## 2022-06-05 MED ORDER — LIDOCAINE HCL (PF) 1 % IJ SOLN
INTRAMUSCULAR | Status: DC | PRN
  Administered 2022-06-05: 20 mL
  Administered 2022-06-05: 60 mL

## 2022-06-05 MED ORDER — HEPARIN (PORCINE) IN NACL 1000-0.9 UT/500ML-% IV SOLN
INTRAVENOUS | Status: DC | PRN
  Administered 2022-06-05 (×2): 500 mL

## 2022-06-05 MED ORDER — SODIUM CHLORIDE 0.9 % IV SOLN
INTRAVENOUS | Status: AC
  Filled 2022-06-05: qty 2

## 2022-06-05 MED ORDER — POVIDONE-IODINE 10 % EX SWAB
2.0000 | Freq: Once | CUTANEOUS | Status: AC
  Administered 2022-06-05: 2 via TOPICAL

## 2022-06-05 MED ORDER — CEFAZOLIN SODIUM-DEXTROSE 2-4 GM/100ML-% IV SOLN
2.0000 g | INTRAVENOUS | Status: AC
  Administered 2022-06-05: 2 g via INTRAVENOUS

## 2022-06-05 MED ORDER — ONDANSETRON HCL 4 MG/2ML IJ SOLN: 4.0000 mg | Freq: Four times a day (QID) | INTRAMUSCULAR | Status: DC | PRN

## 2022-06-05 MED ORDER — MIDAZOLAM HCL 5 MG/5ML IJ SOLN
INTRAMUSCULAR | Status: AC
  Filled 2022-06-05: qty 5

## 2022-06-05 MED ORDER — CHLORHEXIDINE GLUCONATE 4 % EX LIQD
4.0000 | Freq: Once | CUTANEOUS | Status: DC
  Filled 2022-06-05: qty 60

## 2022-06-05 MED ORDER — IODIXANOL 320 MG/ML IV SOLN
INTRAVENOUS | Status: DC | PRN
  Administered 2022-06-05: 10 mL

## 2022-06-05 MED ORDER — ACETAMINOPHEN 325 MG PO TABS
325.0000 mg | ORAL_TABLET | ORAL | Status: DC | PRN
  Administered 2022-06-05: 650 mg via ORAL
  Filled 2022-06-05: qty 2

## 2022-06-05 MED ORDER — LIDOCAINE HCL (PF) 1 % IJ SOLN
INTRAMUSCULAR | Status: AC
  Filled 2022-06-05: qty 30

## 2022-06-05 MED ORDER — LIDOCAINE HCL (PF) 1 % IJ SOLN
INTRAMUSCULAR | Status: AC
  Filled 2022-06-05: qty 60

## 2022-06-05 MED ORDER — CEFAZOLIN SODIUM-DEXTROSE 2-4 GM/100ML-% IV SOLN
INTRAVENOUS | Status: AC
  Filled 2022-06-05: qty 100

## 2022-06-05 SURGICAL SUPPLY — 27 items
CABLE SURGICAL S-101-97-12 (CABLE) ×1 IMPLANT
CATH ATTAIN SEL SURV 6248V-90 (CATHETERS) IMPLANT
CATH CPS DIRECT 135 DS2C020 (CATHETERS) IMPLANT
CATH CPS DIRECT WD DS2C028 (CATHETERS) IMPLANT
CATH RIGHTSITE C315HIS02 (CATHETERS) IMPLANT
CUTTER LV DELIVERY CATHETER 7 (MISCELLANEOUS) IMPLANT
HEMOSTAT SURGICEL 2X4 FIBR (HEMOSTASIS) IMPLANT
ICD UNIFY ASURA CRT CD3357-40C (ICD Generator) IMPLANT
LEAD DURATA 7122-65CM (Lead) IMPLANT
LEAD QUARTET 1458Q-86CM (Lead) IMPLANT
LEAD SELECT SECURE 3830 383069 (Lead) IMPLANT
LEAD SELECT SECURE 3830 383074 (Lead) IMPLANT
LEAD ULTIPACE 52 LPA1231/52 (Lead) IMPLANT
MAT PREVALON FULL STRYKER (MISCELLANEOUS) IMPLANT
PAD DEFIB RADIO PHYSIO CONN (PAD) ×1 IMPLANT
POUCH AIGIS-R ANTIBACT ICD (Mesh General) ×1 IMPLANT
POUCH AIGIS-R ANTIBACT ICD LRG (Mesh General) IMPLANT
SELECT SECURE 3830 383069 (Lead) ×1 IMPLANT
SELECT SECURE 3830 383074 (Lead) ×1 IMPLANT
SHEATH 7FR PRELUDE SNAP 13 (SHEATH) IMPLANT
SHEATH 9.5FR PRELUDE SNAP 13 (SHEATH) IMPLANT
SHEATH 9FR PRELUDE SNAP 13 (SHEATH) IMPLANT
SLITTER 6230UNI (MISCELLANEOUS) IMPLANT
SLITTER AGILIS HISPRO (INSTRUMENTS) IMPLANT
TRAY PACEMAKER INSERTION (PACKS) ×1 IMPLANT
WIRE ACUITY WHISPER EDS 4648 (WIRE) IMPLANT
WIRE HI TORQ VERSACORE-J 145CM (WIRE) IMPLANT

## 2022-06-05 NOTE — Discharge Instructions (Signed)
After Your ICD (Implantable Cardiac Defibrillator)   You have a [employer] ICD  ACTIVITY Do not lift your arm above shoulder height for 1 week after your procedure. After 7 days, you may progress as below.  You should remove your sling 24 hours after your procedure, unless otherwise instructed by your provider.     Thursday June 12, 2022  Friday June 13, 2022 Saturday June 14, 2022 Sunday June 15, 2022   Do not lift, push, pull, or carry anything over 10 pounds with the affected arm until 6 weeks (Thursday July 17, 2022 ) after your procedure.   You may drive AFTER your wound check, unless you have been told otherwise by your provider.   Ask your healthcare provider when you can go back to work   INCISION/Dressing If you are on a blood thinner such as Coumadin, Xarelto, Eliquis, Plavix, or Pradaxa please confirm with your provider when this should be resumed.   If large square, outer bandage is left in place, this can be removed after 24 hours from your procedure. Do not remove steri-strips or glue as below.   Monitor your defibrillator site for redness, swelling, and drainage. Call the device clinic at [phone] if you experience these symptoms or fever/chills.  If your incision is closed with Dermabond/Surgical glue. You may shower 1 day after your pacemaker implant and wash around the site with soap and water.    If you were discharged in a sling, please do not wear this during the day more than 48 hours after your surgery unless otherwise instructed. This may increase the risk of stiffness and soreness in your shoulder.   Avoid lotions, ointments, or perfumes over your incision until it is well-healed.  You may use a hot tub or a pool AFTER your wound check appointment if the incision is completely closed.  Your ICD is designed to protect you from life threatening heart rhythms. Because of this, you may receive a shock.   1 shock with no symptoms:  Call the  office during business hours. 1 shock with symptoms (chest pain, chest pressure, dizziness, lightheadedness, shortness of breath, overall feeling unwell):  Call 911. If you experience 2 or more shocks in 24 hours:  Call 911. If you receive a shock, you should not drive for 6 months per the [place] DMV IF you receive appropriate therapy from your ICD.   ICD Alerts:  Some alerts are vibratory and others beep. These are NOT emergencies. Please call our office to let us know. If this occurs at night or on weekends, it can wait until the next business day. Send a remote transmission.  If your device is capable of reading fluid status (for heart failure), you will be offered monthly monitoring to review this with you.   DEVICE MANAGEMENT Remote monitoring is used to monitor your ICD from home. This monitoring is scheduled every 91 days by our office. It allows [place] to keep an eye on the functioning of your device to ensure it is working properly. You will routinely see your Electrophysiologist annually (more often if necessary).   You should receive your ID card for your new device in 4-8 weeks. Keep this card with you at all times once received. Consider wearing a medical alert bracelet or necklace.  Your ICD  may be MRI compatible. This will be discussed at your next office visit/wound check.  You should avoid contact with strong electric or magnetic fields.   Do not use amateur (ham)  radio equipment or electric (arc) Clinical cytogeneticist. MP3 player headphones with magnets should not be used. Some devices are safe to use if held at least 12 inches (30 cm) from your defibrillator. These include power tools, lawn mowers, and speakers. If you are unsure if something is safe to use, ask your health care provider.  When using your cell phone, hold it to the ear that is on the opposite side from the defibrillator. Do not leave your cell phone in a pocket over the defibrillator.  You may safely use electric blankets,  heating pads, computers, and microwave ovens.  Call the office right away if: You have chest pain. You feel more than one shock. You feel more short of breath than you have felt before. You feel more light-headed than you have felt before. Your incision starts to open up.  This information is not intended to replace advice given to you by your health care provider. Make sure you discuss any questions you have with your health care provider.
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
## 2022-06-06 ENCOUNTER — Telehealth: Payer: Self-pay

## 2022-06-06 ENCOUNTER — Encounter (HOSPITAL_COMMUNITY): Payer: Self-pay | Admitting: Internal Medicine

## 2022-06-06 DIAGNOSIS — G4733 Obstructive sleep apnea (adult) (pediatric): Secondary | ICD-10-CM

## 2022-06-06 NOTE — Telephone Encounter (Signed)
I spoke to Anita/ [facility] to check on the order for the CPAP machine.  When I spoke [facility] 04/17/2022, I was told that they just needed the patient to call  to schedule an appointment to set up the machine.  They had been trying to reach him with no success.  I spoke to the patient that same day and he said he would call them.  He never called.  [facility] called him while I was on the phone with another agent today and they were ready to schedule the set up and he told them his insurance just changed and he now has [employer] HMO and [facility] is not in network with them, only [place].  The [facility] agent informed the patient that we would be sending the order to [facility]  Dr [name] - A new order will need to be sent to [facility] because the current order is over 3 months old  FYI the order for this CPAP was initially placed 06/2021.  He  has not followed up with the DME suppliers and has changed insurance multiple times. The initial order was sent to [facility] and then because of the insurance policy changes it went to [facility] , then [facility] and now needs to go back to [facility].

## 2022-06-07 NOTE — Telephone Encounter (Signed)
Wow no wonder he has no cpap.  Order for new cpap placed

## 2022-06-09 NOTE — Telephone Encounter (Signed)
I called the patient and explained that I am sending the CPAP order to [facility] because his insurance company is in network with [facility] for DME. He said he understood.  I also explained that if he changes his insurance, that could affect insurance coverage going forward.  Again, he said he understood.  Order then faxed to [facility]

## 2022-06-12 ENCOUNTER — Encounter (INDEPENDENT_AMBULATORY_CARE_PROVIDER_SITE_OTHER): Payer: Self-pay

## 2022-06-12 ENCOUNTER — Encounter: Payer: Self-pay | Admitting: Critical Care Medicine

## 2022-06-12 ENCOUNTER — Telehealth: Payer: Self-pay | Admitting: Internal Medicine

## 2022-06-12 NOTE — Telephone Encounter (Signed)
Correction:  *call was disconnected shortly after patient made medical records request.  Update: I called the # on file to let patient know that his request was submitted, but did not get an answer and mail box was full.
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
## 2022-06-13 NOTE — Telephone Encounter (Signed)
Patient wants all records copied from 08/25/20 to present , I told him there would be a fee

## 2022-06-18 ENCOUNTER — Ambulatory Visit: Payer: Medicare HMO | Attending: Cardiology

## 2022-06-18 DIAGNOSIS — I429 Cardiomyopathy, unspecified: Secondary | ICD-10-CM | POA: Diagnosis not present

## 2022-06-18 NOTE — Patient Instructions (Addendum)
   After Your ICD (Implantable Cardiac Defibrillator)    Monitor your defibrillator site for redness, swelling, and drainage. Call the device clinic at [phone] if you experience these symptoms or fever/chills.  Your incision was closed with Steri-strips or staples:  You may shower 7 days after your procedure and wash your incision with soap and water. Avoid lotions, ointments, or perfumes over your incision until it is well-healed.  You may use a hot tub or a pool after your wound check appointment if the incision is completely closed.  Do not lift, push or pull greater than 10 pounds with the affected arm until 6 weeks after your procedure. UNTIL AFTER MARCH 14TH.  There are no other restrictions in arm movement after your wound check appointment.   Your ICD is designed to protect you from life threatening heart rhythms. Because of this, you may receive a shock.   1 shock with no symptoms:  Call the office during business hours. 1 shock with symptoms (chest pain, chest pressure, dizziness, lightheadedness, shortness of breath, overall feeling unwell):  Call 911. If you experience 2 or more shocks in 24 hours:  Call 911. If you receive a shock, you should not drive.  [place] DMV - no driving for 6 months if you receive appropriate therapy from your ICD.   ICD Alerts:  Some alerts are vibratory and others beep. These are NOT emergencies. Please call our office to let us know. If this occurs at night or on weekends, it can wait until the next business day. Send a remote transmission.  If your device is capable of reading fluid status (for heart failure), you will be offered monthly monitoring to review this with you.   Remote monitoring is used to monitor your ICD from home. This monitoring is scheduled every 91 days by our office. It allows [place] to keep an eye on the functioning of your device to ensure it is working properly. You will routinely see your Electrophysiologist annually (more  often if necessary).

## 2022-06-19 LAB — CUP PACEART INCLINIC DEVICE CHECK
Brady Statistic RA Percent Paced: 99 %
Brady Statistic RV Percent Paced: 99 %
Date Time Interrogation Session: 20240214211205
HighPow Impedance: 56 Ohm
Implantable Lead Connection Status: 753985
Implantable Lead Connection Status: 753985
Implantable Lead Connection Status: 753985
Implantable Lead Implant Date: 20240201
Implantable Lead Implant Date: 20240201
Implantable Lead Implant Date: 20240201
Implantable Lead Location: 753858
Implantable Lead Location: 753859
Implantable Lead Location: 753860
Implantable Lead Model: 3830
Implantable Lead Model: 7122
Implantable Pulse Generator Implant Date: 20240201
Lead Channel Impedance Value: 510 Ohm
Lead Channel Impedance Value: 510 Ohm
Lead Channel Impedance Value: 560 Ohm
Lead Channel Pacing Threshold Amplitude: 0.75 V
Lead Channel Pacing Threshold Amplitude: 0.75 V
Lead Channel Pacing Threshold Amplitude: 0.75 V
Lead Channel Pacing Threshold Pulse Width: 0.5 ms
Lead Channel Pacing Threshold Pulse Width: 0.5 ms
Lead Channel Pacing Threshold Pulse Width: 0.5 ms
Lead Channel Sensing Intrinsic Amplitude: 12 mV
Lead Channel Sensing Intrinsic Amplitude: 2.5 mV
Lead Channel Sensing Intrinsic Amplitude: 2.8 mV
Pulse Gen Serial Number: 5559393

## 2022-06-19 NOTE — Progress Notes (Signed)
Device checked by industry rep, Jordi today../ full report exported to [facility] Wound check appointment. Steri-strips removed. Wound without redness or edema. Incision edges approximated with small open area at distal end of incision line.  1 small stitch exposed at surface.  Clipped stitch, cleansed area and re-applied strips to open area.  Wound care instructions given to patient and he is to return in 1 week for site re-eval.   Normal device function. Thresholds, sensing, and impedances consistent with implant measurements. Device programmed at 3.5V for extra safety margin until 3 month visit. Histogram distribution appropriate for patient and level of activity. No mode switches or ventricular arrhythmias noted. Patient educated about wound care, arm mobility, lifting restrictions, shock plan. ROV in 3 months with implanting physician.

## 2022-06-25 ENCOUNTER — Ambulatory Visit: Payer: Medicare HMO | Attending: Internal Medicine

## 2022-06-25 ENCOUNTER — Telehealth: Payer: Self-pay

## 2022-06-25 DIAGNOSIS — Z95810 Presence of automatic (implantable) cardiac defibrillator: Secondary | ICD-10-CM

## 2022-06-25 NOTE — Progress Notes (Signed)
Patient in today for 1 week follow up on device site wound.  Steri strips removed and wound edges are well approximated. No s/s of infection and wound has healed nicely.   We reviewed again lift/push/pull restrictions and free to shower now.   He is to continue monitoring wound site for infection.  Patient has our device clinic number should he have any questions or concerns moving forward.   We will see him back in 3 months at his implant follow up with Dr. Caryl Comes.

## 2022-06-25 NOTE — Telephone Encounter (Signed)
Called patient stated that he needs to com into office and sign a release for all records to be sent

## 2022-06-30 ENCOUNTER — Telehealth: Payer: Self-pay

## 2022-06-30 NOTE — Telephone Encounter (Signed)
I spoke to [place] to inquire if the patient received his CPAP machine.  She said he was scheduled to pick it up on 2.22.204 but did not show up.  She said that all he needs to do is reschedule the pick up time.  I called the patient and he said he had a problem with transportation because he was not going to a medical appointment. I explained to him the importance of getting this machine and he said he understood and he now has transportation with [facility] and his insurance company. I provided him with the phone number for [place] and encouraged him to call.
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
## 2022-07-08 ENCOUNTER — Other Ambulatory Visit: Payer: Self-pay

## 2022-07-08 ENCOUNTER — Encounter: Payer: Self-pay | Admitting: Critical Care Medicine

## 2022-07-08 ENCOUNTER — Ambulatory Visit: Payer: Medicare HMO | Attending: Critical Care Medicine | Admitting: Critical Care Medicine

## 2022-07-08 VITALS — BP 94/61 | HR 67 | Wt 221.8 lb

## 2022-07-08 DIAGNOSIS — Z8673 Personal history of transient ischemic attack (TIA), and cerebral infarction without residual deficits: Secondary | ICD-10-CM | POA: Insufficient documentation

## 2022-07-08 DIAGNOSIS — I130 Hypertensive heart and chronic kidney disease with heart failure and stage 1 through stage 4 chronic kidney disease, or unspecified chronic kidney disease: Secondary | ICD-10-CM | POA: Insufficient documentation

## 2022-07-08 DIAGNOSIS — G4733 Obstructive sleep apnea (adult) (pediatric): Secondary | ICD-10-CM | POA: Insufficient documentation

## 2022-07-08 DIAGNOSIS — Z95810 Presence of automatic (implantable) cardiac defibrillator: Secondary | ICD-10-CM | POA: Diagnosis not present

## 2022-07-08 DIAGNOSIS — I10 Essential (primary) hypertension: Secondary | ICD-10-CM | POA: Diagnosis not present

## 2022-07-08 DIAGNOSIS — M25552 Pain in left hip: Secondary | ICD-10-CM | POA: Insufficient documentation

## 2022-07-08 DIAGNOSIS — R4182 Altered mental status, unspecified: Secondary | ICD-10-CM | POA: Insufficient documentation

## 2022-07-08 DIAGNOSIS — N1831 Chronic kidney disease, stage 3a: Secondary | ICD-10-CM | POA: Diagnosis not present

## 2022-07-08 DIAGNOSIS — F319 Bipolar disorder, unspecified: Secondary | ICD-10-CM | POA: Insufficient documentation

## 2022-07-08 DIAGNOSIS — I25119 Atherosclerotic heart disease of native coronary artery with unspecified angina pectoris: Secondary | ICD-10-CM | POA: Insufficient documentation

## 2022-07-08 DIAGNOSIS — Z7901 Long term (current) use of anticoagulants: Secondary | ICD-10-CM | POA: Diagnosis not present

## 2022-07-08 DIAGNOSIS — I509 Heart failure, unspecified: Secondary | ICD-10-CM | POA: Insufficient documentation

## 2022-07-08 DIAGNOSIS — C8590 Non-Hodgkin lymphoma, unspecified, unspecified site: Secondary | ICD-10-CM | POA: Diagnosis not present

## 2022-07-08 DIAGNOSIS — I5022 Chronic systolic (congestive) heart failure: Secondary | ICD-10-CM | POA: Diagnosis not present

## 2022-07-08 DIAGNOSIS — M542 Cervicalgia: Secondary | ICD-10-CM | POA: Diagnosis not present

## 2022-07-08 DIAGNOSIS — M25551 Pain in right hip: Secondary | ICD-10-CM | POA: Insufficient documentation

## 2022-07-08 DIAGNOSIS — I472 Ventricular tachycardia, unspecified: Secondary | ICD-10-CM | POA: Diagnosis not present

## 2022-07-08 NOTE — Assessment & Plan Note (Signed)
Has yet to pick up his CPAP machine encouraged to do so

## 2022-07-08 NOTE — Telephone Encounter (Signed)
Per Dr [name], as of today, the patient has still not picked up his CPAP machine but he understands what he needs to do to obtain it and has the contact information for [facility]

## 2022-07-08 NOTE — Assessment & Plan Note (Signed)
Hypertension under good control no change in medications

## 2022-07-08 NOTE — Assessment & Plan Note (Signed)
Compensated heart failure with cardiomyopathy

## 2022-07-08 NOTE — Patient Instructions (Signed)
No change in medications , will make sure you have refills on all medications  Pick up your cpap machine  Return Dr [name] 4 months

## 2022-07-08 NOTE — Assessment & Plan Note (Signed)
ICD in place also being paced

## 2022-07-15 ENCOUNTER — Ambulatory Visit: Payer: Medicare Other | Admitting: Critical Care Medicine

## 2022-07-29 ENCOUNTER — Encounter (HOSPITAL_COMMUNITY): Payer: Self-pay | Admitting: Internal Medicine

## 2022-07-29 ENCOUNTER — Ambulatory Visit (HOSPITAL_COMMUNITY)
Admission: RE | Admit: 2022-07-29 | Discharge: 2022-07-29 | Disposition: A | Discharge status: Home / Self Care | Payer: Medicare HMO | Source: Ambulatory Visit | Attending: Internal Medicine | Admitting: Internal Medicine

## 2022-07-29 VITALS — BP 90/50 | HR 62 | Wt 215.6 lb

## 2022-07-29 DIAGNOSIS — N183 Chronic kidney disease, stage 3 unspecified: Secondary | ICD-10-CM | POA: Diagnosis not present

## 2022-07-29 DIAGNOSIS — G4733 Obstructive sleep apnea (adult) (pediatric): Secondary | ICD-10-CM | POA: Diagnosis not present

## 2022-07-29 DIAGNOSIS — I480 Paroxysmal atrial fibrillation: Secondary | ICD-10-CM | POA: Diagnosis not present

## 2022-07-29 DIAGNOSIS — I340 Nonrheumatic mitral (valve) insufficiency: Secondary | ICD-10-CM | POA: Insufficient documentation

## 2022-07-29 DIAGNOSIS — I5022 Chronic systolic (congestive) heart failure: Secondary | ICD-10-CM

## 2022-07-29 DIAGNOSIS — I25119 Atherosclerotic heart disease of native coronary artery with unspecified angina pectoris: Secondary | ICD-10-CM

## 2022-07-29 DIAGNOSIS — I2510 Atherosclerotic heart disease of native coronary artery without angina pectoris: Secondary | ICD-10-CM | POA: Diagnosis not present

## 2022-07-29 DIAGNOSIS — Z7901 Long term (current) use of anticoagulants: Secondary | ICD-10-CM | POA: Diagnosis not present

## 2022-07-29 DIAGNOSIS — N182 Chronic kidney disease, stage 2 (mild): Secondary | ICD-10-CM | POA: Diagnosis not present

## 2022-07-29 DIAGNOSIS — I4892 Unspecified atrial flutter: Secondary | ICD-10-CM | POA: Diagnosis not present

## 2022-07-29 DIAGNOSIS — I5082 Biventricular heart failure: Secondary | ICD-10-CM | POA: Diagnosis not present

## 2022-07-29 LAB — BASIC METABOLIC PANEL
Anion gap: 12 (ref 5–15)
BUN: 16 mg/dL (ref 6–20)
CO2: 23 mmol/L (ref 22–32)
Calcium: 9.6 mg/dL (ref 8.9–10.3)
Chloride: 103 mmol/L (ref 98–111)
Creatinine, Ser: 1.42 mg/dL — ABNORMAL HIGH (ref 0.61–1.24)
GFR, Estimated: 57 mL/min — ABNORMAL LOW (ref >60)
Glucose, Bld: 73 mg/dL (ref 70–99)
Potassium: 4.2 mmol/L (ref 3.5–5.1)
Sodium: 138 mmol/L (ref 135–145)

## 2022-07-29 LAB — BRAIN NATRIURETIC PEPTIDE: B Natriuretic Peptide: 232.2 pg/mL — ABNORMAL HIGH (ref 0.0–100.0)

## 2022-07-29 NOTE — Patient Instructions (Signed)
STOP Bidil    Labs done today, your results will be available in MyChart, we will contact you for abnormal readings.  Your physician has requested that you have an echocardiogram. Echocardiography is a painless test that uses sound waves to create images of your heart. It provides your doctor with information about the size and shape of your heart and how well your heart's chambers and valves are working. This procedure takes approximately one hour. There are no restrictions for this procedure. Please do NOT wear cologne, perfume, aftershave, or lotions (deodorant is allowed). Please arrive 15 minutes prior to your appointment time.  Your physician recommends that you schedule a follow-up appointment in: 4 months with echocardiogram(July) Call office in May for an appointment  If you have any questions or concerns before your next appointment please send [place] a message through [place] or call our office at [phone].    TO LEAVE A MESSAGE FOR THE NURSE SELECT OPTION 2, PLEASE LEAVE A MESSAGE INCLUDING: YOUR NAME DATE OF BIRTH CALL BACK NUMBER REASON FOR CALL**this is important as we prioritize the call backs  YOU WILL RECEIVE A CALL BACK THE SAME DAY AS LONG AS YOU CALL BEFORE 4:00 PM  At the [facility], you and your health needs are our priority. As part of our continuing mission to provide you with exceptional heart care, we have created designated Provider Care Teams. These Care Teams include your primary Cardiologist (physician) and Advanced Practice Providers (APPs- Physician Assistants and Nurse Practitioners) who all work together to provide you with the care you need, when you need it.   You may see any of the following providers on your designated Care Team at your next follow up: Dr Glori Bickers Dr Loralie Champagne Dr. Roxana Hires, NP Lyda Jester, [place] [facility] [place], [place] Forestine Na, NP Audry Riles, PharmD   Please be  sure to bring in all your medications bottles to every appointment.    Thank you for choosing [facility]
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
## 2022-08-11 ENCOUNTER — Other Ambulatory Visit (HOSPITAL_COMMUNITY): Payer: Self-pay

## 2022-08-11 ENCOUNTER — Other Ambulatory Visit: Payer: Self-pay | Admitting: Critical Care Medicine

## 2022-08-11 ENCOUNTER — Other Ambulatory Visit: Payer: Self-pay

## 2022-08-11 MED ORDER — CARVEDILOL 6.25 MG PO TABS
6.2500 mg | ORAL_TABLET | Freq: Two times a day (BID) | ORAL | 1 refills | Status: DC
  Filled 2022-08-11: qty 120, 60d supply, fill #0
  Filled 2022-10-16 – 2022-11-27 (×2): qty 120, 60d supply, fill #1

## 2022-08-11 MED ORDER — ATORVASTATIN CALCIUM 80 MG PO TABS
80.0000 mg | ORAL_TABLET | Freq: Every day | ORAL | 0 refills | Status: DC
  Filled 2022-08-11: qty 90, 90d supply, fill #0

## 2022-08-11 MED ORDER — SENNOSIDES 8.6 MG PO TABS
1.0000 | ORAL_TABLET | Freq: Every day | ORAL | 4 refills | Status: DC
  Filled 2022-08-11 – 2022-10-16 (×3): qty 30, 30d supply, fill #0

## 2022-08-11 MED ORDER — TAMSULOSIN HCL 0.4 MG PO CAPS
0.4000 mg | ORAL_CAPSULE | Freq: Every day | ORAL | 0 refills | Status: DC
  Filled 2022-08-11: qty 30, 30d supply, fill #0

## 2022-08-11 MED ORDER — ALLOPURINOL 100 MG PO TABS
100.0000 mg | ORAL_TABLET | Freq: Every day | ORAL | 0 refills | Status: DC
  Filled 2022-08-11: qty 90, 90d supply, fill #0

## 2022-08-12 ENCOUNTER — Other Ambulatory Visit (HOSPITAL_COMMUNITY): Payer: Self-pay

## 2022-08-12 ENCOUNTER — Other Ambulatory Visit: Payer: Self-pay

## 2022-08-12 MED ORDER — LITHIUM CARBONATE ER 300 MG PO TBCR
300.0000 mg | EXTENDED_RELEASE_TABLET | Freq: Two times a day (BID) | ORAL | 3 refills | Status: DC
  Filled 2022-08-12 – 2022-08-27 (×2): qty 60, 30d supply, fill #0
  Filled 2022-10-16: qty 60, 30d supply, fill #1

## 2022-08-12 NOTE — Telephone Encounter (Signed)
Requested medication (s) are due for refill today- yes  Requested medication (s) are on the active medication list -yes  Future visit scheduled -yes  Last refill: 08/20/21 #60 3RF  Notes to clinic: non delegated Rx  Requested Prescriptions  Pending Prescriptions Disp Refills   lithium carbonate (LITHOBID) 300 MG ER tablet 60 tablet 3    Sig: Take 1 tablet (300 mg total) by mouth 2 (two) times daily.     Not Delegated - Psychiatry:  Antimanics/Bipolar Agents Failed - 08/11/2022  9:31 AM      Failed - This refill cannot be delegated      Failed - TSH in normal range and within 180 days    TSH  Date Value Ref Range Status  05/16/2021 3.279 0.350 - 4.500 uIU/mL Final    Comment:    Performed by a 3rd Generation assay with a functional sensitivity of <=0.01 uIU/mL. Performed at [facility], [address] [place], [place] [address]          Failed - Lithium Level in normal range and within 180 days    No results found for: "POCLITH", "LITHIUM"       Failed - CBC within normal limits and completed in the last 6 months    WBC  Date Value Ref Range Status  05/29/2022 4.7 3.4 - 10.8 x10E3/uL Final  05/16/2021 4.9 4.0 - 10.5 K/uL Final   RBC  Date Value Ref Range Status  05/29/2022 5.19 4.14 - 5.80 x10E6/uL Final  05/16/2021 4.77 4.22 - 5.81 MIL/uL Final   Hemoglobin  Date Value Ref Range Status  05/29/2022 15.6 13.0 - 17.7 g/dL Final   Hematocrit  Date Value Ref Range Status  05/29/2022 47.2 37.5 - 51.0 % Final   MCHC  Date Value Ref Range Status  05/29/2022 33.1 31.5 - 35.7 g/dL Final  62/13/086501/04/2022 78.434.1 30.0 - 36.0 g/dL Final   [facility]  Date Value Ref Range Status  05/29/2022 30.1 26.6 - 33.0 pg Final  05/16/2021 30.0 26.0 - 34.0 pg Final   MCV  Date Value Ref Range Status  05/29/2022 91 79 - 97 fL Final   No results found for: "PLTCOUNTKUC", "LABPLAT", "POCPLA" RDW  Date Value Ref Range Status  05/29/2022 13.5 11.6 - 15.4 % Final         Failed - BMP  within normal limits in the last 6 months    Glucose, Bld  Date Value Ref Range Status  07/29/2022 73 70 - 99 mg/dL Final    Comment:    Glucose reference range applies only to samples taken after fasting for at least 8 hours.   Calcium  Date Value Ref Range Status  07/29/2022 9.6 8.9 - 10.3 mg/dL Final   Calcium, Ion  Date Value Ref Range Status  06/28/2021 1.09 (L) 1.15 - 1.40 mmol/L Final   Sodium  Date Value Ref Range Status  07/29/2022 138 135 - 145 mmol/L Final  05/29/2022 141 134 - 144 mmol/L Final   Potassium  Date Value Ref Range Status  07/29/2022 4.2 3.5 - 5.1 mmol/L Final   Chloride  Date Value Ref Range Status  07/29/2022 103 98 - 111 mmol/L Final   BUN  Date Value Ref Range Status  07/29/2022 16 6 - 20 mg/dL Final  69/62/952801/25/2024 9 6 - 24 mg/dL Final   Creatinine, Ser  Date Value Ref Range Status  07/29/2022 1.42 (H) 0.61 - 1.24 mg/dL Final   CO2  Date Value Ref  Range Status  07/29/2022 23 22 - 32 mmol/L Final   TCO2  Date Value Ref Range Status  06/28/2021 24 22 - 32 mmol/L Final   GFR calc Af Amer  Date Value Ref Range Status  09/26/2019 79 >59 mL/min/1.73 Final    Comment:    **[facility] currently reports eGFR in compliance with the current**   recommendations of the [employer]. [facility] will   update reporting as new guidelines are published from the NKF-ASN   Task force.    eGFR  Date Value Ref Range Status  05/29/2022 59 (L) >59 mL/min/1.73 Final   GFR, Estimated  Date Value Ref Range Status  07/29/2022 57 (L) >60 mL/min Final    Comment:    (NOTE) Calculated using the CKD-EPI Creatinine Equation (2021)          Passed - eGFR is 30 or above and within 180 days    GFR calc Af Amer  Date Value Ref Range Status  09/26/2019 79 >59 mL/min/1.73 Final    Comment:    **[facility] currently reports eGFR in compliance with the current**   recommendations of the [employer]. [facility] will   update reporting as  new guidelines are published from the NKF-ASN   Task force.    GFR, Estimated  Date Value Ref Range Status  07/29/2022 57 (L) >60 mL/min Final    Comment:    (NOTE) Calculated using the CKD-EPI Creatinine Equation (2021)    eGFR  Date Value Ref Range Status  05/29/2022 59 (L) >59 mL/min/1.73 Final         Passed - Patient is not pregnant      Passed - Valid encounter within last 6 months    Recent Outpatient Visits           1 month ago Essential hypertension   [place] [facility] Storm Frisk, MD   3 months ago Encounter for [employer] annual wellness exam   [facility] Storm Frisk, MD   5 months ago Chronic congestive heart failure, unspecified heart failure type [facility])   [place] [facility] [place], [place], [place]   8 months ago Essential hypertension   [place] [facility] Storm Frisk, MD   11 months ago Dysuria   [facility] Storm Frisk, MD       Future Appointments             In 2 months Storm Frisk, MD [place] [facility]               Requested Prescriptions  Pending Prescriptions Disp Refills   lithium carbonate (LITHOBID) 300 MG ER tablet 60 tablet 3    Sig: Take 1 tablet (300 mg total) by mouth 2 (two) times daily.     Not Delegated - Psychiatry:  Antimanics/Bipolar Agents Failed - 08/11/2022  9:31 AM      Failed - This refill cannot be delegated      Failed - TSH in normal range and within 180 days    TSH  Date Value Ref Range Status  05/16/2021 3.279 0.350 - 4.500 uIU/mL Final    Comment:    Performed by a 3rd Generation assay with a functional sensitivity of <=0.01 uIU/mL. Performed at [facility], [address], [place], [place] [address]  Failed - Lithium Level in normal range and within 180 days    No results found  for: "POCLITH", "LITHIUM"       Failed - CBC within normal limits and completed in the last 6 months    WBC  Date Value Ref Range Status  05/29/2022 4.7 3.4 - 10.8 x10E3/uL Final  05/16/2021 4.9 4.0 - 10.5 K/uL Final   RBC  Date Value Ref Range Status  05/29/2022 5.19 4.14 - 5.80 x10E6/uL Final  05/16/2021 4.77 4.22 - 5.81 MIL/uL Final   Hemoglobin  Date Value Ref Range Status  05/29/2022 15.6 13.0 - 17.7 g/dL Final   Hematocrit  Date Value Ref Range Status  05/29/2022 47.2 37.5 - 51.0 % Final   MCHC  Date Value Ref Range Status  05/29/2022 33.1 31.5 - 35.7 g/dL Final  43/32/9518 84.1 30.0 - 36.0 g/dL Final   [facility]  Date Value Ref Range Status  05/29/2022 30.1 26.6 - 33.0 pg Final  05/16/2021 30.0 26.0 - 34.0 pg Final   MCV  Date Value Ref Range Status  05/29/2022 91 79 - 97 fL Final   No results found for: "PLTCOUNTKUC", "LABPLAT", "POCPLA" RDW  Date Value Ref Range Status  05/29/2022 13.5 11.6 - 15.4 % Final         Failed - BMP within normal limits in the last 6 months    Glucose, Bld  Date Value Ref Range Status  07/29/2022 73 70 - 99 mg/dL Final    Comment:    Glucose reference range applies only to samples taken after fasting for at least 8 hours.   Calcium  Date Value Ref Range Status  07/29/2022 9.6 8.9 - 10.3 mg/dL Final   Calcium, Ion  Date Value Ref Range Status  06/28/2021 1.09 (L) 1.15 - 1.40 mmol/L Final   Sodium  Date Value Ref Range Status  07/29/2022 138 135 - 145 mmol/L Final  05/29/2022 141 134 - 144 mmol/L Final   Potassium  Date Value Ref Range Status  07/29/2022 4.2 3.5 - 5.1 mmol/L Final   Chloride  Date Value Ref Range Status  07/29/2022 103 98 - 111 mmol/L Final   BUN  Date Value Ref Range Status  07/29/2022 16 6 - 20 mg/dL Final  66/10/3014 9 6 - 24 mg/dL Final   Creatinine, Ser  Date Value Ref Range Status  07/29/2022 1.42 (H) 0.61 - 1.24 mg/dL Final   CO2  Date Value Ref Range Status  07/29/2022 23 22 - 32  mmol/L Final   TCO2  Date Value Ref Range Status  06/28/2021 24 22 - 32 mmol/L Final   GFR calc Af Amer  Date Value Ref Range Status  09/26/2019 79 >59 mL/min/1.73 Final    Comment:    **[facility] currently reports eGFR in compliance with the current**   recommendations of the [employer]. [facility] will   update reporting as new guidelines are published from the NKF-ASN   Task force.    eGFR  Date Value Ref Range Status  05/29/2022 59 (L) >59 mL/min/1.73 Final   GFR, Estimated  Date Value Ref Range Status  07/29/2022 57 (L) >60 mL/min Final    Comment:    (NOTE) Calculated using the CKD-EPI Creatinine Equation (2021)          Passed - eGFR is 30 or above and within 180 days    GFR calc Af Amer  Date Value Ref Range Status  09/26/2019 79 >59 mL/min/1.73 Final  Comment:    **[facility] currently reports eGFR in compliance with the current**   recommendations of the [employer]. [facility] will   update reporting as new guidelines are published from the NKF-ASN   Task force.    GFR, Estimated  Date Value Ref Range Status  07/29/2022 57 (L) >60 mL/min Final    Comment:    (NOTE) Calculated using the CKD-EPI Creatinine Equation (2021)    eGFR  Date Value Ref Range Status  05/29/2022 59 (L) >59 mL/min/1.73 Final         Passed - Patient is not pregnant      Passed - Valid encounter within last 6 months    Recent Outpatient Visits           1 month ago Essential hypertension   [place] [facility] Storm Frisk, MD   3 months ago Encounter for [employer] annual wellness exam   [facility] Storm Frisk, MD   5 months ago Chronic congestive heart failure, unspecified heart failure type [facility])   [place] [facility] [place], [place], [place]   8 months ago Essential hypertension   [place] [facility] Storm Frisk, MD    11 months ago Dysuria   [facility] Storm Frisk, MD       Future Appointments             In 2 months Storm Frisk, MD [facility]

## 2022-08-13 ENCOUNTER — Other Ambulatory Visit (HOSPITAL_COMMUNITY): Payer: Self-pay

## 2022-08-13 ENCOUNTER — Other Ambulatory Visit: Payer: Self-pay

## 2022-08-22 ENCOUNTER — Other Ambulatory Visit (HOSPITAL_COMMUNITY): Payer: Self-pay

## 2022-08-27 ENCOUNTER — Other Ambulatory Visit: Payer: Self-pay

## 2022-08-27 ENCOUNTER — Other Ambulatory Visit (HOSPITAL_COMMUNITY): Payer: Self-pay

## 2022-09-08 DIAGNOSIS — I4510 Unspecified right bundle-branch block: Secondary | ICD-10-CM | POA: Insufficient documentation

## 2022-09-11 ENCOUNTER — Encounter: Payer: Medicare Other | Admitting: Internal Medicine

## 2022-09-11 ENCOUNTER — Ambulatory Visit: Payer: Medicare HMO | Admitting: Internal Medicine

## 2022-09-11 DIAGNOSIS — I4892 Unspecified atrial flutter: Secondary | ICD-10-CM

## 2022-09-11 DIAGNOSIS — I509 Heart failure, unspecified: Secondary | ICD-10-CM

## 2022-09-11 DIAGNOSIS — I428 Other cardiomyopathies: Secondary | ICD-10-CM

## 2022-09-11 DIAGNOSIS — I4510 Unspecified right bundle-branch block: Secondary | ICD-10-CM

## 2022-09-17 ENCOUNTER — Ambulatory Visit (INDEPENDENT_AMBULATORY_CARE_PROVIDER_SITE_OTHER): Payer: Medicare HMO

## 2022-09-17 DIAGNOSIS — I429 Cardiomyopathy, unspecified: Secondary | ICD-10-CM | POA: Diagnosis not present

## 2022-09-17 LAB — CUP PACEART REMOTE DEVICE CHECK
Battery Remaining Longevity: 46 mo
Battery Remaining Percentage: 91 %
Battery Voltage: 3.1 V
Brady Statistic AP VP Percent: 86 %
Brady Statistic AP VS Percent: 1 %
Brady Statistic AS VP Percent: 13 %
Brady Statistic AS VS Percent: 1 %
Brady Statistic RA Percent Paced: 86 %
Date Time Interrogation Session: 20240515030520
HighPow Impedance: 64 Ohm
HighPow Impedance: 64 Ohm
Implantable Lead Connection Status: 753985
Implantable Lead Connection Status: 753985
Implantable Lead Connection Status: 753985
Implantable Lead Implant Date: 20240201
Implantable Lead Implant Date: 20240201
Implantable Lead Implant Date: 20240201
Implantable Lead Location: 753858
Implantable Lead Location: 753859
Implantable Lead Location: 753860
Implantable Lead Model: 3830
Implantable Lead Model: 7122
Implantable Pulse Generator Implant Date: 20240201
Lead Channel Impedance Value: 410 Ohm
Lead Channel Impedance Value: 540 Ohm
Lead Channel Impedance Value: 610 Ohm
Lead Channel Pacing Threshold Amplitude: 0.75 V
Lead Channel Pacing Threshold Amplitude: 0.75 V
Lead Channel Pacing Threshold Amplitude: 0.75 V
Lead Channel Pacing Threshold Pulse Width: 0.5 ms
Lead Channel Pacing Threshold Pulse Width: 0.5 ms
Lead Channel Pacing Threshold Pulse Width: 0.5 ms
Lead Channel Sensing Intrinsic Amplitude: 1.1 mV
Lead Channel Sensing Intrinsic Amplitude: 12 mV
Lead Channel Setting Pacing Amplitude: 3.5 V
Lead Channel Setting Pacing Amplitude: 3.5 V
Lead Channel Setting Pacing Amplitude: 3.5 V
Lead Channel Setting Pacing Pulse Width: 0.5 ms
Lead Channel Setting Pacing Pulse Width: 0.5 ms
Lead Channel Setting Sensing Sensitivity: 1 mV
Pulse Gen Serial Number: 5559393
Zone Setting Status: 755011

## 2022-10-02 NOTE — Progress Notes (Signed)
Remote ICD transmission.   

## 2022-10-15 DIAGNOSIS — G4733 Obstructive sleep apnea (adult) (pediatric): Secondary | ICD-10-CM | POA: Diagnosis not present

## 2022-10-16 ENCOUNTER — Other Ambulatory Visit: Payer: Self-pay

## 2022-10-16 ENCOUNTER — Other Ambulatory Visit (HOSPITAL_COMMUNITY): Payer: Self-pay | Admitting: Family Medicine

## 2022-10-16 ENCOUNTER — Encounter: Payer: Self-pay | Admitting: Internal Medicine

## 2022-10-16 ENCOUNTER — Other Ambulatory Visit: Payer: Self-pay | Admitting: Critical Care Medicine

## 2022-10-16 ENCOUNTER — Ambulatory Visit: Payer: 59 | Attending: Internal Medicine | Admitting: Internal Medicine

## 2022-10-16 VITALS — BP 112/72 | HR 80 | Ht 68.0 in | Wt 202.0 lb

## 2022-10-16 DIAGNOSIS — I4892 Unspecified atrial flutter: Secondary | ICD-10-CM | POA: Diagnosis not present

## 2022-10-16 DIAGNOSIS — I4510 Unspecified right bundle-branch block: Secondary | ICD-10-CM

## 2022-10-16 DIAGNOSIS — I509 Heart failure, unspecified: Secondary | ICD-10-CM

## 2022-10-16 DIAGNOSIS — I5082 Biventricular heart failure: Secondary | ICD-10-CM

## 2022-10-16 DIAGNOSIS — I10 Essential (primary) hypertension: Secondary | ICD-10-CM

## 2022-10-16 DIAGNOSIS — I428 Other cardiomyopathies: Secondary | ICD-10-CM | POA: Diagnosis not present

## 2022-10-16 MED ORDER — IRBESARTAN 75 MG PO TABS
37.5000 mg | ORAL_TABLET | Freq: Every day | ORAL | 0 refills | Status: DC
  Filled 2022-10-16: qty 15, 30d supply, fill #0

## 2022-10-16 MED ORDER — VALPROIC ACID 250 MG PO CAPS
250.0000 mg | ORAL_CAPSULE | Freq: Three times a day (TID) | ORAL | 0 refills | Status: DC
  Filled 2022-10-16 – 2022-11-27 (×3): qty 90, 30d supply, fill #0

## 2022-10-16 MED ORDER — SPIRONOLACTONE 25 MG PO TABS
25.0000 mg | ORAL_TABLET | Freq: Every day | ORAL | 0 refills | Status: DC
Start: 2022-10-16 — End: 2023-02-11
  Filled 2022-10-16 – 2022-11-27 (×3): qty 45, 45d supply, fill #0

## 2022-10-16 MED ORDER — AMIODARONE HCL 100 MG PO TABS
100.0000 mg | ORAL_TABLET | Freq: Every day | ORAL | 3 refills | Status: DC
  Filled 2022-10-16: qty 30, 30d supply, fill #0

## 2022-10-16 MED ORDER — QUETIAPINE FUMARATE ER 300 MG PO TB24
300.0000 mg | ORAL_TABLET | Freq: Every day | ORAL | 0 refills | Status: DC
  Filled 2022-10-16 – 2022-11-27 (×2): qty 30, 30d supply, fill #0

## 2022-10-16 NOTE — Patient Instructions (Signed)
Medication Instructions:   ** Stop Amiodarone 200mg   ** Begin Amiodarone 100mg  - 1 tablet by mouth daily  ** Per Dr Graciela Husbands please send a MyChart message to clarify if you are taking Metoprolol and Carvedilol as well as Irbesartan.  *If you need a refill on your cardiac medications before your next appointment, please call your pharmacy*   Lab Work: None ordered.  If you have labs (blood work) drawn today and your tests are completely normal, you will receive your results only by: MyChart Message (if you have MyChart) OR A paper copy in the mail If you have any lab test that is abnormal or we need to change your treatment, we will call you to review the results.   Testing/Procedures: None ordered.    Follow-Up: At [facility], you and your health needs are our priority.  As part of our continuing mission to provide you with exceptional heart care, we have created designated Provider Care Teams.  These Care Teams include your primary Cardiologist (physician) and Advanced Practice Providers (APPs -  Physician Assistants and Nurse Practitioners) who all work together to provide you with the care you need, when you need it.  We recommend signing up for the patient portal called "MyChart".  Sign up information is provided on this After Visit Summary.  MyChart is used to connect with patients for Virtual Visits (Telemedicine).  Patients are able to view lab/test results, encounter notes, upcoming appointments, etc.  Non-urgent messages can be sent to your provider as well.   To learn more about what you can do with MyChart, go to [email or web address].    Your next appointment:   9 months with Dr Graciela Husbands
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
## 2022-10-17 ENCOUNTER — Other Ambulatory Visit: Payer: Self-pay

## 2022-10-20 ENCOUNTER — Other Ambulatory Visit: Payer: Self-pay

## 2022-10-22 ENCOUNTER — Other Ambulatory Visit: Payer: Self-pay

## 2022-10-27 ENCOUNTER — Other Ambulatory Visit: Payer: Self-pay

## 2022-10-30 ENCOUNTER — Other Ambulatory Visit: Payer: Self-pay | Admitting: Critical Care Medicine

## 2022-10-30 NOTE — Telephone Encounter (Signed)
Medication Refill - Medication: isosorbide-hydrALAZINE (BIDIL) 20-37.5 MG tablet  metoprolol succinate (TOPROL-XL) 50 MG 24 hr tablet  allopurinol (ZYLOPRIM) 100 MG tablet  amiodarone (PACERONE) 100 MG tablet  atorvastatin (LIPITOR) 80 MG tablet  colchicine 0.6 MG tablet  ELIQUIS 5 MG TABS tablet  irbesartan (AVAPRO) 75 MG tablet  Has the patient contacted their pharmacy? Yes.   (Agent: If no, request that the patient contact the pharmacy for the refill. If patient does not wish to contact the pharmacy document the reason why and proceed with request.) (Agent: If yes, when and what did the pharmacy advise?)  Preferred Pharmacy (with phone number or street name):  [facility] - [place], [place] - [address]  [address] [place] [address]  Phone: [phone] Fax: [phone]   Has the patient been seen for an appointment in the last year OR does the patient have an upcoming appointment? Yes.    Agent: Please be advised that RX refills may take up to 3 business days. We ask that you follow-up with your pharmacy.

## 2022-10-31 MED ORDER — AMIODARONE HCL 100 MG PO TABS: 100.0000 mg | ORAL_TABLET | Freq: Every day | ORAL | 3 refills | Status: DC

## 2022-10-31 MED ORDER — APIXABAN 5 MG PO TABS: 5.0000 mg | ORAL_TABLET | Freq: Two times a day (BID) | ORAL | 0 refills | Status: DC

## 2022-10-31 MED ORDER — IRBESARTAN 75 MG PO TABS: 37.5000 mg | ORAL_TABLET | Freq: Every day | ORAL | 0 refills | Status: DC

## 2022-10-31 MED ORDER — LITHIUM CARBONATE ER 300 MG PO TBCR: 300.0000 mg | EXTENDED_RELEASE_TABLET | Freq: Two times a day (BID) | ORAL | 1 refills | Status: DC

## 2022-10-31 MED ORDER — ATORVASTATIN CALCIUM 80 MG PO TABS: 80.0000 mg | ORAL_TABLET | Freq: Every day | ORAL | 0 refills | Status: DC

## 2022-10-31 MED ORDER — ALLOPURINOL 100 MG PO TABS: 100.0000 mg | ORAL_TABLET | Freq: Every day | ORAL | 0 refills | Status: DC

## 2022-10-31 MED ORDER — COLCHICINE 0.6 MG PO TABS: 0.6000 mg | ORAL_TABLET | ORAL | 2 refills | Status: DC

## 2022-10-31 MED ORDER — METOPROLOL SUCCINATE ER 50 MG PO TB24: 50.0000 mg | ORAL_TABLET | Freq: Every day | ORAL | 0 refills | Status: DC

## 2022-10-31 NOTE — Telephone Encounter (Signed)
Requested Prescriptions  Pending Prescriptions Disp Refills   allopurinol (ZYLOPRIM) 100 MG tablet 90 tablet 0    Sig: Take 1 tablet (100 mg total) by mouth daily.     Endocrinology:  Gout Agents - allopurinol Failed - 10/30/2022  9:34 AM      Failed - Uric Acid in normal range and within 360 days    Uric Acid  Date Value Ref Range Status  09/26/2019 8.7 (H) 3.8 - 8.4 mg/dL Final    Comment:               Therapeutic target for gout patients: <6.0         Failed - Cr in normal range and within 360 days    Creatinine, Ser  Date Value Ref Range Status  07/29/2022 1.42 (H) 0.61 - 1.24 mg/dL Final         Failed - CBC within normal limits and completed in the last 12 months    WBC  Date Value Ref Range Status  05/29/2022 4.7 3.4 - 10.8 x10E3/uL Final  05/16/2021 4.9 4.0 - 10.5 K/uL Final   RBC  Date Value Ref Range Status  05/29/2022 5.19 4.14 - 5.80 x10E6/uL Final  05/16/2021 4.77 4.22 - 5.81 MIL/uL Final   Hemoglobin  Date Value Ref Range Status  05/29/2022 15.6 13.0 - 17.7 g/dL Final   Hematocrit  Date Value Ref Range Status  05/29/2022 47.2 37.5 - 51.0 % Final   MCHC  Date Value Ref Range Status  05/29/2022 33.1 31.5 - 35.7 g/dL Final  54/01/8118 14.7 30.0 - 36.0 g/dL Final   [facility]  Date Value Ref Range Status  05/29/2022 30.1 26.6 - 33.0 pg Final  05/16/2021 30.0 26.0 - 34.0 pg Final   MCV  Date Value Ref Range Status  05/29/2022 91 79 - 97 fL Final   No results found for: "PLTCOUNTKUC", "LABPLAT", "POCPLA" RDW  Date Value Ref Range Status  05/29/2022 13.5 11.6 - 15.4 % Final         Passed - Valid encounter within last 12 months    Recent Outpatient Visits           3 months ago Essential hypertension   [facility] Storm Frisk, MD   6 months ago Encounter for [employer] annual wellness exam   [place] [facility] Storm Frisk, MD   7 months ago Chronic congestive heart failure,  unspecified heart failure type [facility])   [place] [place], Marylene Land M, [place]   10 months ago Essential hypertension   [place] [facility] Storm Frisk, MD   1 year ago Dysuria   [facility] Storm Frisk, MD       Future Appointments             In 4 days Storm Frisk, MD [place] [facility]             amiodarone (PACERONE) 100 MG tablet 90 tablet 3    Sig: Take 1 tablet (100 mg total) by mouth daily.     Not Delegated - Cardiovascular: Antiarrhythmic Agents - amiodarone Failed - 10/30/2022  9:34 AM      Failed - This refill cannot be delegated      Failed - Manual Review: Eye exam recommended every 12 months      Failed -  TSH in normal range and within 360 days    TSH  Date Value Ref Range Status  05/16/2021 3.279 0.350 - 4.500 uIU/mL Final    Comment:    Performed by a 3rd Generation assay with a functional sensitivity of <=0.01 uIU/mL. Performed at [facility], [address], [place], [place] [address]          Failed - Mg Level in normal range and within 360 days    Magnesium  Date Value Ref Range Status  08/30/2020 1.7 1.7 - 2.4 mg/dL Final    Comment:    Performed at [facility], [address] [place], [place] [address]         Failed - AST in normal range and within 180 days    AST  Date Value Ref Range Status  02/17/2022 17 15 - 41 U/L Final         Failed - ALT in normal range and within 180 days    ALT  Date Value Ref Range Status  02/17/2022 12 0 - 44 U/L Final         Passed - K in normal range and within 180 days    Potassium  Date Value Ref Range Status  07/29/2022 4.2 3.5 - 5.1 mmol/L Final         Passed - Patient had ECG in the last 180 days      Passed - Patient is not pregnant      Passed - Last BP in normal range    BP Readings from Last 1 Encounters:  10/16/22 112/72          Passed - Last Heart Rate in normal range    Pulse Readings from Last 1 Encounters:  10/16/22 80         Passed - Valid encounter within last 6 months    Recent Outpatient Visits           3 months ago Essential hypertension   [place] [facility] Storm Frisk, MD   6 months ago Encounter for [employer] annual wellness exam   [facility] Storm Frisk, MD   7 months ago Chronic congestive heart failure, unspecified heart failure type [facility])   [place] [facility] [place], Marylene Land M, [place]   10 months ago Essential hypertension   [place] [facility] Storm Frisk, MD   1 year ago Dysuria   [facility] Storm Frisk, MD       Future Appointments             In 4 days Storm Frisk, MD  [facility]            Passed - Patient had chest x-ray within the last 6 months       atorvastatin (LIPITOR) 80 MG tablet 90 tablet 0    Sig: Take 1 tablet (80 mg total) by mouth daily.     Cardiovascular:  Antilipid - Statins Failed - 10/30/2022  9:34 AM      Failed - Lipid Panel in normal range within the last 12 months    Cholesterol, Total  Date Value Ref Range Status  08/24/2018 133 100 - 199 mg/dL Final   Cholesterol  Date Value Ref Range Status  08/27/2020 168 0 - 200 mg/dL Final  LDL Calculated  Date Value Ref Range Status  08/24/2018 76 0 - 99 mg/dL Final   LDL Cholesterol  Date Value Ref Range Status  08/27/2020 120 (H) 0 - 99 mg/dL Final    Comment:           Total Cholesterol/HDL:CHD Risk Coronary Heart Disease Risk Table                     Men   Women  1/2 Average Risk   3.4   3.3  Average Risk       5.0   4.4  2 X Average Risk   9.6   7.1  3 X Average Risk  23.4   11.0        Use the calculated Patient Ratio above and the CHD Risk Table to determine the  patient's CHD Risk.        ATP III CLASSIFICATION (LDL):  <100     mg/dL   Optimal  161-096  mg/dL   Near or Above                    Optimal  130-159  mg/dL   Borderline  045-409  mg/dL   High  >811     mg/dL   Very High Performed at [facility], [address] [place], [place] [address]    HDL  Date Value Ref Range Status  08/27/2020 36 (L) >40 mg/dL Final  29/56/2130 45 >86 mg/dL Final   Triglycerides  Date Value Ref Range Status  08/27/2020 59 <150 mg/dL Final         Passed - Patient is not pregnant      Passed - Valid encounter within last 12 months    Recent Outpatient Visits           3 months ago Essential hypertension   [place] [facility] Storm Frisk, MD   6 months ago Encounter for [employer] annual wellness exam   [place] [facility] Storm Frisk, MD   7 months ago Chronic congestive heart failure, unspecified heart failure type [facility])   [place] [facility] [place], Marylene Land M, [place]   10 months ago Essential hypertension   [facility] Storm Frisk, MD   1 year ago Dysuria   [facility] Storm Frisk, MD       Future Appointments             In 4 days Storm Frisk, MD [place] [facility]             colchicine 0.6 MG tablet 15 tablet 2    Sig: Take 1 tablet (0.6 mg total) by mouth every other day.     Endocrinology:  Gout Agents - colchicine Failed - 10/30/2022  9:34 AM      Failed - Cr in normal range and within 360 days    Creatinine, Ser  Date Value Ref Range Status  07/29/2022 1.42 (H) 0.61 - 1.24 mg/dL Final         Failed - CBC within normal limits and completed in the last 12 months    WBC  Date Value Ref Range Status  05/29/2022 4.7 3.4 - 10.8 x10E3/uL Final  05/16/2021 4.9 4.0 - 10.5 K/uL Final   RBC  Date Value Ref  Range  Status  05/29/2022 5.19 4.14 - 5.80 x10E6/uL Final  05/16/2021 4.77 4.22 - 5.81 MIL/uL Final   Hemoglobin  Date Value Ref Range Status  05/29/2022 15.6 13.0 - 17.7 g/dL Final   Hematocrit  Date Value Ref Range Status  05/29/2022 47.2 37.5 - 51.0 % Final   MCHC  Date Value Ref Range Status  05/29/2022 33.1 31.5 - 35.7 g/dL Final  16/02/9603 54.0 30.0 - 36.0 g/dL Final   [facility]  Date Value Ref Range Status  05/29/2022 30.1 26.6 - 33.0 pg Final  05/16/2021 30.0 26.0 - 34.0 pg Final   MCV  Date Value Ref Range Status  05/29/2022 91 79 - 97 fL Final   No results found for: "PLTCOUNTKUC", "LABPLAT", "POCPLA" RDW  Date Value Ref Range Status  05/29/2022 13.5 11.6 - 15.4 % Final         Passed - ALT in normal range and within 360 days    ALT  Date Value Ref Range Status  02/17/2022 12 0 - 44 U/L Final         Passed - AST in normal range and within 360 days    AST  Date Value Ref Range Status  02/17/2022 17 15 - 41 U/L Final         Passed - Valid encounter within last 12 months    Recent Outpatient Visits           3 months ago Essential hypertension   [place] [facility] Storm Frisk, MD   6 months ago Encounter for [employer] annual wellness exam   [facility] Storm Frisk, MD   7 months ago Chronic congestive heart failure, unspecified heart failure type [facility])   [place] [facility] [place], Marylene Land M, [place]   10 months ago Essential hypertension   [place] [facility] Storm Frisk, MD   1 year ago Dysuria   [facility] Storm Frisk, MD       Future Appointments             In 4 days Storm Frisk, MD [facility]             apixaban (ELIQUIS) 5 MG TABS tablet 180 tablet 0    Sig: Take 1 tablet (5 mg total) by mouth 2 (two) times daily.      Hematology:  Anticoagulants - apixaban Failed - 10/30/2022  9:34 AM      Failed - Cr in normal range and within 360 days    Creatinine, Ser  Date Value Ref Range Status  07/29/2022 1.42 (H) 0.61 - 1.24 mg/dL Final         Passed - PLT in normal range and within 360 days    Platelets  Date Value Ref Range Status  05/29/2022 256 150 - 450 x10E3/uL Final         Passed - HGB in normal range and within 360 days    Hemoglobin  Date Value Ref Range Status  05/29/2022 15.6 13.0 - 17.7 g/dL Final         Passed - HCT in normal range and within 360 days    Hematocrit  Date Value Ref Range Status  05/29/2022 47.2 37.5 - 51.0 % Final         Passed - AST in normal range  and within 360 days    AST  Date Value Ref Range Status  02/17/2022 17 15 - 41 U/L Final         Passed - ALT in normal range and within 360 days    ALT  Date Value Ref Range Status  02/17/2022 12 0 - 44 U/L Final         Passed - Valid encounter within last 12 months    Recent Outpatient Visits           3 months ago Essential hypertension   [place] [facility] Storm Frisk, MD   6 months ago Encounter for [employer] annual wellness exam   [facility] Storm Frisk, MD   7 months ago Chronic congestive heart failure, unspecified heart failure type [facility])   [place] [place], Marylene Land M, [place]   10 months ago Essential hypertension   [place] [facility] Storm Frisk, MD   1 year ago Dysuria   [place] Storm Frisk, MD       Future Appointments             In 4 days Storm Frisk, MD [facility]             irbesartan (AVAPRO) 75 MG tablet 45 tablet 0    Sig: Take 0.5 tablets (37.5 mg total) by mouth daily.     Cardiovascular:  Angiotensin Receptor Blockers Failed - 10/30/2022  9:34 AM       Failed - Cr in normal range and within 180 days    Creatinine, Ser  Date Value Ref Range Status  07/29/2022 1.42 (H) 0.61 - 1.24 mg/dL Final         Passed - K in normal range and within 180 days    Potassium  Date Value Ref Range Status  07/29/2022 4.2 3.5 - 5.1 mmol/L Final         Passed - Patient is not pregnant      Passed - Last BP in normal range    BP Readings from Last 1 Encounters:  10/16/22 112/72         Passed - Valid encounter within last 6 months    Recent Outpatient Visits           3 months ago Essential hypertension   [place] Storm Frisk, MD   6 months ago Encounter for [employer] annual wellness exam   [facility] Storm Frisk, MD   7 months ago Chronic congestive heart failure, unspecified heart failure type [facility])   [place] [facility] [place], Marylene Land M, [place]   10 months ago Essential hypertension   [place] [facility] Storm Frisk, MD   1 year ago Dysuria   [facility] Storm Frisk, MD       Future Appointments             In 4 days Storm Frisk, MD  [facility]             metoprolol succinate (TOPROL-XL) 50 MG 24 hr tablet 90 tablet 0    Sig: Take 1 tablet (50 mg total) by mouth daily.  Take with or immediately following a meal.     Cardiovascular:  Beta Blockers Passed - 10/30/2022  9:34 AM      Passed - Last BP in normal range    BP Readings from Last 1 Encounters:  10/16/22 112/72         Passed - Last Heart Rate in normal range    Pulse Readings from Last 1 Encounters:  10/16/22 80         Passed - Valid encounter within last 6 months    Recent Outpatient Visits           3 months ago Essential hypertension   [place] [facility] Storm Frisk, MD   6 months ago Encounter  for [employer] annual wellness exam   [facility] Storm Frisk, MD   7 months ago Chronic congestive heart failure, unspecified heart failure type [facility])   [place] [facility], Marylene Land M, [place]   10 months ago Essential hypertension   [place] [facility] Storm Frisk, MD   1 year ago Dysuria   [facility] Storm Frisk, MD       Future Appointments             In 4 days Storm Frisk, MD [facility]             lithium carbonate (LITHOBID) 300 MG ER tablet 60 tablet 3    Sig: Take 1 tablet (300 mg total) by mouth 2 (two) times daily.     Not Delegated - Psychiatry:  Antimanics/Bipolar Agents Failed - 10/30/2022  9:34 AM      Failed - This refill cannot be delegated      Failed - TSH in normal range and within 180 days    TSH  Date Value Ref Range Status  05/16/2021 3.279 0.350 - 4.500 uIU/mL Final    Comment:    Performed by a 3rd Generation assay with a functional sensitivity of <=0.01 uIU/mL. Performed at [facility], [address], [place], [place] [address]          Failed - Lithium Level in normal range and within 180 days    No results found for: "POCLITH", "LITHIUM"       Failed - CBC within normal limits and completed in the last 6 months    WBC  Date Value Ref Range Status  05/29/2022 4.7 3.4 - 10.8 x10E3/uL Final  05/16/2021 4.9 4.0 - 10.5 K/uL Final   RBC  Date Value Ref Range Status  05/29/2022 5.19 4.14 - 5.80 x10E6/uL Final  05/16/2021 4.77 4.22 - 5.81 MIL/uL Final   Hemoglobin  Date Value Ref Range Status  05/29/2022 15.6 13.0 - 17.7 g/dL Final   Hematocrit  Date Value Ref Range Status  05/29/2022 47.2 37.5 - 51.0 % Final   MCHC  Date Value Ref Range Status  05/29/2022 33.1 31.5 - 35.7 g/dL Final  60/45/4098 11.9 30.0 - 36.0 g/dL Final   [facility]  Date Value  Ref Range Status  05/29/2022 30.1 26.6 - 33.0 pg Final  05/16/2021 30.0 26.0 - 34.0 pg Final   MCV  Date Value Ref Range Status  05/29/2022 91 79 - 97 fL Final   No results found for: "PLTCOUNTKUC", "LABPLAT", "POCPLA" RDW  Date Value Ref Range Status  05/29/2022  13.5 11.6 - 15.4 % Final         Passed - eGFR is 30 or above and within 180 days    GFR calc Af Amer  Date Value Ref Range Status  09/26/2019 79 >59 mL/min/1.73 Final    Comment:    **[facility] currently reports eGFR in compliance with the current**   recommendations of the [employer]. [facility] will   update reporting as new guidelines are published from the NKF-ASN   Task force.    GFR, Estimated  Date Value Ref Range Status  07/29/2022 57 (L) >60 mL/min Final    Comment:    (NOTE) Calculated using the CKD-EPI Creatinine Equation (2021)    eGFR  Date Value Ref Range Status  05/29/2022 59 (L) >59 mL/min/1.73 Final         Passed - Patient is not pregnant      Passed - Valid encounter within last 6 months    Recent Outpatient Visits           3 months ago Essential hypertension   [facility] Storm Frisk, MD   6 months ago Encounter for [employer] annual wellness exam   [facility] Storm Frisk, MD   7 months ago Chronic congestive heart failure, unspecified heart failure type [facility])   [place] [facility] [place], Marylene Land M, [place]   10 months ago Essential hypertension   [place] Storm Frisk, MD   1 year ago Dysuria   [facility] Storm Frisk, MD       Future Appointments             In 4 days Storm Frisk, MD [facility]            Passed - BMP within normal limits in the last 6 months    Glucose, Bld  Date Value Ref Range Status  07/29/2022 73 70 - 99  mg/dL Final    Comment:    Glucose reference range applies only to samples taken after fasting for at least 8 hours.   Calcium  Date Value Ref Range Status  07/29/2022 9.6 8.9 - 10.3 mg/dL Final   Calcium, Ion  Date Value Ref Range Status  06/28/2021 1.09 (L) 1.15 - 1.40 mmol/L Final   Sodium  Date Value Ref Range Status  07/29/2022 138 135 - 145 mmol/L Final  05/29/2022 141 134 - 144 mmol/L Final   Potassium  Date Value Ref Range Status  07/29/2022 4.2 3.5 - 5.1 mmol/L Final   Chloride  Date Value Ref Range Status  07/29/2022 103 98 - 111 mmol/L Final   BUN  Date Value Ref Range Status  07/29/2022 16 6 - 20 mg/dL Final  78/29/5621 9 6 - 24 mg/dL Final   Creatinine, Ser  Date Value Ref Range Status  07/29/2022 1.42 (H) 0.61 - 1.24 mg/dL Final   CO2  Date Value Ref Range Status  07/29/2022 23 22 - 32 mmol/L Final   TCO2  Date Value Ref Range Status  06/28/2021 24 22 - 32 mmol/L Final   GFR calc Af Amer  Date Value Ref Range Status  09/26/2019 79 >59 mL/min/1.73 Final    Comment:    **[facility] currently reports eGFR in compliance with the current**   recommendations of the [employer].  [facility] will   update reporting as new guidelines are published from the NKF-ASN   Task force.    eGFR  Date Value Ref Range Status  05/29/2022 59 (L) >59 mL/min/1.73 Final   GFR, Estimated  Date Value Ref Range Status  07/29/2022 57 (L) >60 mL/min Final    Comment:    (NOTE) Calculated using the CKD-EPI Creatinine Equation (2021)

## 2022-10-31 NOTE — Telephone Encounter (Signed)
Requested medication (s) are due for refill today: yes  Requested medication (s) are on the active medication list: yes  Last refill:  08/12/22  Future visit scheduled: yes  Notes to clinic:  Unable to refill per protocol, cannot delegate.      Requested Prescriptions  Pending Prescriptions Disp Refills   amiodarone (PACERONE) 100 MG tablet 90 tablet 3    Sig: Take 1 tablet (100 mg total) by mouth daily.     Not Delegated - Cardiovascular: Antiarrhythmic Agents - amiodarone Failed - 10/30/2022  9:34 AM      Failed - This refill cannot be delegated      Failed - Manual Review: Eye exam recommended every 12 months      Failed - TSH in normal range and within 360 days    TSH  Date Value Ref Range Status  05/16/2021 3.279 0.350 - 4.500 uIU/mL Final    Comment:    Performed by a 3rd Generation assay with a functional sensitivity of <=0.01 uIU/mL. Performed at [facility], [address] [place], [place] [address]          Failed - Mg Level in normal range and within 360 days    Magnesium  Date Value Ref Range Status  08/30/2020 1.7 1.7 - 2.4 mg/dL Final    Comment:    Performed at [facility], [address], [place], [place] [address]         Failed - AST in normal range and within 180 days    AST  Date Value Ref Range Status  02/17/2022 17 15 - 41 U/L Final         Failed - ALT in normal range and within 180 days    ALT  Date Value Ref Range Status  02/17/2022 12 0 - 44 U/L Final         Passed - K in normal range and within 180 days    Potassium  Date Value Ref Range Status  07/29/2022 4.2 3.5 - 5.1 mmol/L Final         Passed - Patient had ECG in the last 180 days      Passed - Patient is not pregnant      Passed - Last BP in normal range    BP Readings from Last 1 Encounters:  10/16/22 112/72         Passed - Last Heart Rate in normal range    Pulse Readings from Last 1 Encounters:  10/16/22 80         Passed - Valid encounter  within last 6 months    Recent Outpatient Visits           3 months ago Essential hypertension   [place] [facility] Storm Frisk, MD   6 months ago Encounter for [employer] annual wellness exam   [facility] Storm Frisk, MD   7 months ago Chronic congestive heart failure, unspecified heart failure type [facility])   [facility] [place], Marylene Land M, [place]   10 months ago Essential hypertension   [place] [facility] Storm Frisk, MD   1 year ago Dysuria   [facility] Storm Frisk, MD       Future Appointments             In 4 days Delford Field,  Charlcie Cradle, MD [place] [facility]            Passed - Patient had chest x-ray within the last 6 months       lithium carbonate (LITHOBID) 300 MG ER tablet 60 tablet 3    Sig: Take 1 tablet (300 mg total) by mouth 2 (two) times daily.     Not Delegated - Psychiatry:  Antimanics/Bipolar Agents Failed - 10/30/2022  9:34 AM      Failed - This refill cannot be delegated      Failed - TSH in normal range and within 180 days    TSH  Date Value Ref Range Status  05/16/2021 3.279 0.350 - 4.500 uIU/mL Final    Comment:    Performed by a 3rd Generation assay with a functional sensitivity of <=0.01 uIU/mL. Performed at [facility], [address] [place], [place] [address]          Failed - Lithium Level in normal range and within 180 days    No results found for: "POCLITH", "LITHIUM"       Failed - CBC within normal limits and completed in the last 6 months    WBC  Date Value Ref Range Status  05/29/2022 4.7 3.4 - 10.8 x10E3/uL Final  05/16/2021 4.9 4.0 - 10.5 K/uL Final   RBC  Date Value Ref Range Status  05/29/2022 5.19 4.14 - 5.80 x10E6/uL Final  05/16/2021 4.77 4.22 - 5.81 MIL/uL Final   Hemoglobin  Date Value Ref Range Status   05/29/2022 15.6 13.0 - 17.7 g/dL Final   Hematocrit  Date Value Ref Range Status  05/29/2022 47.2 37.5 - 51.0 % Final   MCHC  Date Value Ref Range Status  05/29/2022 33.1 31.5 - 35.7 g/dL Final  19/14/7829 56.2 30.0 - 36.0 g/dL Final   [facility]  Date Value Ref Range Status  05/29/2022 30.1 26.6 - 33.0 pg Final  05/16/2021 30.0 26.0 - 34.0 pg Final   MCV  Date Value Ref Range Status  05/29/2022 91 79 - 97 fL Final   No results found for: "PLTCOUNTKUC", "LABPLAT", "POCPLA" RDW  Date Value Ref Range Status  05/29/2022 13.5 11.6 - 15.4 % Final         Passed - eGFR is 30 or above and within 180 days    GFR calc Af Amer  Date Value Ref Range Status  09/26/2019 79 >59 mL/min/1.73 Final    Comment:    **[facility] currently reports eGFR in compliance with the current**   recommendations of the [employer]. [facility] will   update reporting as new guidelines are published from the NKF-ASN   Task force.    GFR, Estimated  Date Value Ref Range Status  07/29/2022 57 (L) >60 mL/min Final    Comment:    (NOTE) Calculated using the CKD-EPI Creatinine Equation (2021)    eGFR  Date Value Ref Range Status  05/29/2022 59 (L) >59 mL/min/1.73 Final         Passed - Patient is not pregnant      Passed - Valid encounter within last 6 months    Recent Outpatient Visits           3 months ago Essential hypertension   [place] [facility] Storm Frisk, MD   6 months ago Encounter for [employer] annual wellness exam   [facility] Storm Frisk, MD  7 months ago Chronic congestive heart failure, unspecified heart failure type [facility])   [place] [facility] [place], Marylene Land M, [place]   10 months ago Essential hypertension    [facility] Storm Frisk, MD   1 year ago Dysuria   [facility] Storm Frisk, MD        Future Appointments             In 4 days Storm Frisk, MD [facility]            Passed - BMP within normal limits in the last 6 months    Glucose, Bld  Date Value Ref Range Status  07/29/2022 73 70 - 99 mg/dL Final    Comment:    Glucose reference range applies only to samples taken after fasting for at least 8 hours.   Calcium  Date Value Ref Range Status  07/29/2022 9.6 8.9 - 10.3 mg/dL Final   Calcium, Ion  Date Value Ref Range Status  06/28/2021 1.09 (L) 1.15 - 1.40 mmol/L Final   Sodium  Date Value Ref Range Status  07/29/2022 138 135 - 145 mmol/L Final  05/29/2022 141 134 - 144 mmol/L Final   Potassium  Date Value Ref Range Status  07/29/2022 4.2 3.5 - 5.1 mmol/L Final   Chloride  Date Value Ref Range Status  07/29/2022 103 98 - 111 mmol/L Final   BUN  Date Value Ref Range Status  07/29/2022 16 6 - 20 mg/dL Final  82/95/6213 9 6 - 24 mg/dL Final   Creatinine, Ser  Date Value Ref Range Status  07/29/2022 1.42 (H) 0.61 - 1.24 mg/dL Final   CO2  Date Value Ref Range Status  07/29/2022 23 22 - 32 mmol/L Final   TCO2  Date Value Ref Range Status  06/28/2021 24 22 - 32 mmol/L Final   GFR calc Af Amer  Date Value Ref Range Status  09/26/2019 79 >59 mL/min/1.73 Final    Comment:    **[facility] currently reports eGFR in compliance with the current**   recommendations of the [employer]. [facility] will   update reporting as new guidelines are published from the NKF-ASN   Task force.    eGFR  Date Value Ref Range Status  05/29/2022 59 (L) >59 mL/min/1.73 Final   GFR, Estimated  Date Value Ref Range Status  07/29/2022 57 (L) >60 mL/min Final    Comment:    (NOTE) Calculated using the CKD-EPI Creatinine Equation (2021)          Signed Prescriptions Disp Refills   allopurinol (ZYLOPRIM) 100 MG tablet 90 tablet 0    Sig: Take 1 tablet (100 mg total) by mouth daily.      Endocrinology:  Gout Agents - allopurinol Failed - 10/30/2022  9:34 AM      Failed - Uric Acid in normal range and within 360 days    Uric Acid  Date Value Ref Range Status  09/26/2019 8.7 (H) 3.8 - 8.4 mg/dL Final    Comment:               Therapeutic target for gout patients: <6.0         Failed - Cr in normal range and within 360 days    Creatinine, Ser  Date Value Ref Range Status  07/29/2022 1.42 (H) 0.61 - 1.24 mg/dL Final  Failed - CBC within normal limits and completed in the last 12 months    WBC  Date Value Ref Range Status  05/29/2022 4.7 3.4 - 10.8 x10E3/uL Final  05/16/2021 4.9 4.0 - 10.5 K/uL Final   RBC  Date Value Ref Range Status  05/29/2022 5.19 4.14 - 5.80 x10E6/uL Final  05/16/2021 4.77 4.22 - 5.81 MIL/uL Final   Hemoglobin  Date Value Ref Range Status  05/29/2022 15.6 13.0 - 17.7 g/dL Final   Hematocrit  Date Value Ref Range Status  05/29/2022 47.2 37.5 - 51.0 % Final   MCHC  Date Value Ref Range Status  05/29/2022 33.1 31.5 - 35.7 g/dL Final  45/40/9811 91.4 30.0 - 36.0 g/dL Final   [facility]  Date Value Ref Range Status  05/29/2022 30.1 26.6 - 33.0 pg Final  05/16/2021 30.0 26.0 - 34.0 pg Final   MCV  Date Value Ref Range Status  05/29/2022 91 79 - 97 fL Final   No results found for: "PLTCOUNTKUC", "LABPLAT", "POCPLA" RDW  Date Value Ref Range Status  05/29/2022 13.5 11.6 - 15.4 % Final         Passed - Valid encounter within last 12 months    Recent Outpatient Visits           3 months ago Essential hypertension   [place] [facility] Storm Frisk, MD   6 months ago Encounter for [employer] annual wellness exam   [facility] Storm Frisk, MD   7 months ago Chronic congestive heart failure, unspecified heart failure type [facility])   [place] [facility] [place], Marylene Land M, [place]   10 months ago Essential hypertension   [place] [facility] Storm Frisk, MD   1 year ago Dysuria   [facility] Storm Frisk, MD       Future Appointments             In 4 days Storm Frisk, MD [place] [facility]             atorvastatin (LIPITOR) 80 MG tablet 90 tablet 0    Sig: Take 1 tablet (80 mg total) by mouth daily.     Cardiovascular:  Antilipid - Statins Failed - 10/30/2022  9:34 AM      Failed - Lipid Panel in normal range within the last 12 months    Cholesterol, Total  Date Value Ref Range Status  08/24/2018 133 100 - 199 mg/dL Final   Cholesterol  Date Value Ref Range Status  08/27/2020 168 0 - 200 mg/dL Final   LDL Calculated  Date Value Ref Range Status  08/24/2018 76 0 - 99 mg/dL Final   LDL Cholesterol  Date Value Ref Range Status  08/27/2020 120 (H) 0 - 99 mg/dL Final    Comment:           Total Cholesterol/HDL:CHD Risk Coronary Heart Disease Risk Table                     Men   Women  1/2 Average Risk   3.4   3.3  Average Risk       5.0   4.4  2 X Average Risk   9.6   7.1  3 X Average Risk  23.4   11.0        Use  the calculated Patient Ratio above and the CHD Risk Table to determine the patient's CHD Risk.        ATP III CLASSIFICATION (LDL):  <100     mg/dL   Optimal  284-132  mg/dL   Near or Above                    Optimal  130-159  mg/dL   Borderline  440-102  mg/dL   High  >725     mg/dL   Very High Performed at [facility], [address], [place], [place] [address]    HDL  Date Value Ref Range Status  08/27/2020 36 (L) >40 mg/dL Final  03/47/4259 45 >56 mg/dL Final   Triglycerides  Date Value Ref Range Status  08/27/2020 59 <150 mg/dL Final         Passed - Patient is not pregnant      Passed - Valid encounter within last 12 months    Recent Outpatient Visits           3 months ago Essential hypertension   [place] [facility] Storm Frisk, MD   6 months ago Encounter for [employer] annual wellness exam   [facility] Storm Frisk, MD   7 months ago Chronic congestive heart failure, unspecified heart failure type [facility])   [place] [facility] [place], Marylene Land M, [place]   10 months ago Essential hypertension   [place] [facility] Storm Frisk, MD   1 year ago Dysuria   [facility] Storm Frisk, MD       Future Appointments             In 4 days Storm Frisk, MD  [facility]             colchicine 0.6 MG tablet 15 tablet 2    Sig: Take 1 tablet (0.6 mg total) by mouth every other day.     Endocrinology:  Gout Agents - colchicine Failed - 10/30/2022  9:34 AM      Failed - Cr in normal range and within 360 days    Creatinine, Ser  Date Value Ref Range Status  07/29/2022 1.42 (H) 0.61 - 1.24 mg/dL Final         Failed - CBC within normal limits and completed in the last 12 months    WBC  Date Value Ref Range Status  05/29/2022 4.7 3.4 - 10.8 x10E3/uL Final  05/16/2021 4.9 4.0 - 10.5 K/uL Final   RBC  Date Value Ref Range Status  05/29/2022 5.19 4.14 - 5.80 x10E6/uL Final  05/16/2021 4.77 4.22 - 5.81 MIL/uL Final   Hemoglobin  Date Value Ref Range Status  05/29/2022 15.6 13.0 - 17.7 g/dL Final   Hematocrit  Date Value Ref Range Status  05/29/2022 47.2 37.5 - 51.0 % Final   MCHC  Date Value Ref Range Status  05/29/2022 33.1 31.5 - 35.7 g/dL Final  38/75/6433 29.5 30.0 - 36.0 g/dL Final   [facility]  Date Value Ref Range Status  05/29/2022 30.1 26.6 - 33.0 pg Final  05/16/2021 30.0 26.0 - 34.0 pg Final   MCV  Date Value Ref Range Status  05/29/2022 91 79 - 97 fL Final   No results found for: "PLTCOUNTKUC", "LABPLAT", "POCPLA" RDW  Date Value Ref  Range Status  05/29/2022 13.5 11.6 - 15.4 % Final         Passed - ALT in  normal range and within 360 days    ALT  Date Value Ref Range Status  02/17/2022 12 0 - 44 U/L Final         Passed - AST in normal range and within 360 days    AST  Date Value Ref Range Status  02/17/2022 17 15 - 41 U/L Final         Passed - Valid encounter within last 12 months    Recent Outpatient Visits           3 months ago Essential hypertension   [facility] Storm Frisk, MD   6 months ago Encounter for [employer] annual wellness exam   [place] [facility] Storm Frisk, MD   7 months ago Chronic congestive heart failure, unspecified heart failure type [facility])   [place] [facility] [place], Marylene Land M, [place]   10 months ago Essential hypertension   [place] [facility] Storm Frisk, MD   1 year ago Dysuria   [facility] Storm Frisk, MD       Future Appointments             In 4 days Storm Frisk, MD [facility]             apixaban (ELIQUIS) 5 MG TABS tablet 180 tablet 0    Sig: Take 1 tablet (5 mg total) by mouth 2 (two) times daily.     Hematology:  Anticoagulants - apixaban Failed - 10/30/2022  9:34 AM      Failed - Cr in normal range and within 360 days    Creatinine, Ser  Date Value Ref Range Status  07/29/2022 1.42 (H) 0.61 - 1.24 mg/dL Final         Passed - PLT in normal range and within 360 days    Platelets  Date Value Ref Range Status  05/29/2022 256 150 - 450 x10E3/uL Final         Passed - HGB in normal range and within 360 days    Hemoglobin  Date Value Ref Range Status  05/29/2022 15.6 13.0 - 17.7 g/dL Final         Passed - HCT in normal range and within 360 days    Hematocrit  Date Value Ref Range Status  05/29/2022 47.2 37.5 - 51.0 % Final         Passed - AST in normal range and within 360 days    AST  Date Value Ref  Range Status  02/17/2022 17 15 - 41 U/L Final         Passed - ALT in normal range and within 360 days    ALT  Date Value Ref Range Status  02/17/2022 12 0 - 44 U/L Final         Passed - Valid encounter within last 12 months    Recent Outpatient Visits           3 months ago Essential hypertension   [place] [facility] Storm [facility]isk, MD   6 months ago Encounter for [employer] annual wellness exam   [place] [facility] Storm Frisk, MD  7 months ago Chronic congestive heart failure, unspecified heart failure type [facility])   [place] [facility] [place], Marylene Land M, [place]   10 months ago Essential hypertension   [place] Storm Frisk, MD   1 year ago Dysuria   [facility] Storm Frisk, MD       Future Appointments             In 4 days Storm Frisk, MD [facility]             irbesartan (AVAPRO) 75 MG tablet 45 tablet 0    Sig: Take 0.5 tablets (37.5 mg total) by mouth daily.     Cardiovascular:  Angiotensin Receptor Blockers Failed - 10/30/2022  9:34 AM      Failed - Cr in normal range and within 180 days    Creatinine, Ser  Date Value Ref Range Status  07/29/2022 1.42 (H) 0.61 - 1.24 mg/dL Final         Passed - K in normal range and within 180 days    Potassium  Date Value Ref Range Status  07/29/2022 4.2 3.5 - 5.1 mmol/L Final         Passed - Patient is not pregnant      Passed - Last BP in normal range    BP Readings from Last 1 Encounters:  10/16/22 112/72         Passed - Valid encounter within last 6 months    Recent Outpatient Visits           3 months ago Essential hypertension   [place] [facility] Storm Frisk, MD   6 months ago Encounter for [employer] annual wellness exam   [facility] Storm Frisk, MD   7 months ago Chronic congestive heart failure, unspecified heart failure type [facility])   [place] [facility] [place], Marylene Land M, [place]   10 months ago Essential hypertension   [place] [facility] Storm Frisk, MD   1 year ago Dysuria   [facility] Storm Frisk, MD       Future Appointments             In 4 days Storm Frisk, MD [place] [facility]             metoprolol succinate (TOPROL-XL) 50 MG 24 hr tablet 90 tablet 0    Sig: Take 1 tablet (50 mg total) by mouth daily. Take with or immediately following a meal.     Cardiovascular:  Beta Blockers Passed - 10/30/2022  9:34 AM      Passed - Last BP in normal range    BP Readings from Last 1 Encounters:  10/16/22 112/72         Passed - Last Heart Rate in normal range    Pulse Readings from Last 1 Encounters:  10/16/22 80         Passed - Valid encounter within last 6 months    Recent Outpatient Visits           3 months ago Essential hypertension   [place] [facility] Storm Frisk, MD   6 months ago Encounter for Medicare annual wellness exam  [facility] Storm Frisk, MD   7 months ago Chronic congestive heart failure, unspecified heart failure type [facility])   [place] [facility] [place], Marylene Land M, [place]   10 months ago Essential hypertension   [place] [facility] Storm Frisk, MD   1 year ago Dysuria   [facility] Storm Frisk, MD       Future Appointments             In 4 days Storm Frisk, MD [facility]

## 2022-11-04 ENCOUNTER — Ambulatory Visit: Payer: 59 | Admitting: Critical Care Medicine

## 2022-11-11 DIAGNOSIS — Z8679 Personal history of other diseases of the circulatory system: Secondary | ICD-10-CM | POA: Diagnosis not present

## 2022-11-11 DIAGNOSIS — R079 Chest pain, unspecified: Secondary | ICD-10-CM | POA: Diagnosis not present

## 2022-11-11 DIAGNOSIS — I498 Other specified cardiac arrhythmias: Secondary | ICD-10-CM | POA: Diagnosis not present

## 2022-11-11 DIAGNOSIS — J441 Chronic obstructive pulmonary disease with (acute) exacerbation: Secondary | ICD-10-CM | POA: Diagnosis not present

## 2022-11-11 DIAGNOSIS — R1084 Generalized abdominal pain: Secondary | ICD-10-CM | POA: Diagnosis not present

## 2022-11-11 DIAGNOSIS — Z950 Presence of cardiac pacemaker: Secondary | ICD-10-CM | POA: Diagnosis not present

## 2022-11-11 DIAGNOSIS — Z8572 Personal history of non-Hodgkin lymphomas: Secondary | ICD-10-CM | POA: Diagnosis not present

## 2022-11-11 DIAGNOSIS — I2510 Atherosclerotic heart disease of native coronary artery without angina pectoris: Secondary | ICD-10-CM | POA: Diagnosis not present

## 2022-11-11 DIAGNOSIS — I214 Non-ST elevation (NSTEMI) myocardial infarction: Secondary | ICD-10-CM | POA: Diagnosis not present

## 2022-11-11 DIAGNOSIS — I517 Cardiomegaly: Secondary | ICD-10-CM | POA: Diagnosis not present

## 2022-11-11 DIAGNOSIS — U071 COVID-19: Secondary | ICD-10-CM | POA: Diagnosis not present

## 2022-11-11 DIAGNOSIS — R0681 Apnea, not elsewhere classified: Secondary | ICD-10-CM | POA: Diagnosis not present

## 2022-11-11 DIAGNOSIS — I130 Hypertensive heart and chronic kidney disease with heart failure and stage 1 through stage 4 chronic kidney disease, or unspecified chronic kidney disease: Secondary | ICD-10-CM | POA: Diagnosis not present

## 2022-11-11 DIAGNOSIS — Z7901 Long term (current) use of anticoagulants: Secondary | ICD-10-CM | POA: Diagnosis not present

## 2022-11-11 DIAGNOSIS — E876 Hypokalemia: Secondary | ICD-10-CM | POA: Diagnosis not present

## 2022-11-11 DIAGNOSIS — K5909 Other constipation: Secondary | ICD-10-CM | POA: Diagnosis not present

## 2022-11-11 DIAGNOSIS — R0602 Shortness of breath: Secondary | ICD-10-CM | POA: Diagnosis not present

## 2022-11-11 DIAGNOSIS — Z978 Presence of other specified devices: Secondary | ICD-10-CM | POA: Diagnosis not present

## 2022-11-11 DIAGNOSIS — I509 Heart failure, unspecified: Secondary | ICD-10-CM | POA: Diagnosis not present

## 2022-11-11 DIAGNOSIS — E1122 Type 2 diabetes mellitus with diabetic chronic kidney disease: Secondary | ICD-10-CM | POA: Diagnosis not present

## 2022-11-11 DIAGNOSIS — I21A1 Myocardial infarction type 2: Secondary | ICD-10-CM | POA: Diagnosis not present

## 2022-11-11 DIAGNOSIS — R918 Other nonspecific abnormal finding of lung field: Secondary | ICD-10-CM | POA: Diagnosis not present

## 2022-11-11 DIAGNOSIS — C8590 Non-Hodgkin lymphoma, unspecified, unspecified site: Secondary | ICD-10-CM | POA: Diagnosis not present

## 2022-11-11 DIAGNOSIS — J9601 Acute respiratory failure with hypoxia: Secondary | ICD-10-CM | POA: Diagnosis not present

## 2022-11-11 DIAGNOSIS — Z8673 Personal history of transient ischemic attack (TIA), and cerebral infarction without residual deficits: Secondary | ICD-10-CM | POA: Diagnosis not present

## 2022-11-11 DIAGNOSIS — F331 Major depressive disorder, recurrent, moderate: Secondary | ICD-10-CM | POA: Diagnosis not present

## 2022-11-11 DIAGNOSIS — I5023 Acute on chronic systolic (congestive) heart failure: Secondary | ICD-10-CM | POA: Diagnosis not present

## 2022-11-11 DIAGNOSIS — J90 Pleural effusion, not elsewhere classified: Secondary | ICD-10-CM | POA: Diagnosis not present

## 2022-11-11 DIAGNOSIS — N189 Chronic kidney disease, unspecified: Secondary | ICD-10-CM | POA: Diagnosis not present

## 2022-11-11 DIAGNOSIS — R7989 Other specified abnormal findings of blood chemistry: Secondary | ICD-10-CM | POA: Diagnosis not present

## 2022-11-11 DIAGNOSIS — R0682 Tachypnea, not elsewhere classified: Secondary | ICD-10-CM | POA: Diagnosis not present

## 2022-11-11 DIAGNOSIS — T50916A Underdosing of multiple unspecified drugs, medicaments and biological substances, initial encounter: Secondary | ICD-10-CM | POA: Diagnosis not present

## 2022-11-12 ENCOUNTER — Other Ambulatory Visit: Payer: Self-pay

## 2022-11-12 ENCOUNTER — Other Ambulatory Visit (HOSPITAL_COMMUNITY): Payer: Self-pay

## 2022-11-16 DIAGNOSIS — I498 Other specified cardiac arrhythmias: Secondary | ICD-10-CM | POA: Diagnosis not present

## 2022-11-16 DIAGNOSIS — I493 Ventricular premature depolarization: Secondary | ICD-10-CM | POA: Diagnosis not present

## 2022-11-16 DIAGNOSIS — Z950 Presence of cardiac pacemaker: Secondary | ICD-10-CM | POA: Diagnosis not present

## 2022-11-27 ENCOUNTER — Other Ambulatory Visit: Payer: Self-pay

## 2022-11-27 ENCOUNTER — Other Ambulatory Visit: Payer: Self-pay | Admitting: Critical Care Medicine

## 2022-11-27 ENCOUNTER — Other Ambulatory Visit (HOSPITAL_COMMUNITY): Payer: Self-pay

## 2022-11-27 ENCOUNTER — Other Ambulatory Visit (HOSPITAL_COMMUNITY): Payer: Self-pay | Admitting: Internal Medicine

## 2022-11-27 NOTE — Telephone Encounter (Signed)
Requested Prescriptions  Refused Prescriptions Disp Refills   allopurinol (ZYLOPRIM) 100 MG tablet 90 tablet 0    Sig: Take 1 tablet (100 mg total) by mouth daily.     Endocrinology:  Gout Agents - allopurinol Failed - 11/27/2022 10:15 AM      Failed - Uric Acid in normal range and within 360 days    Uric Acid  Date Value Ref Range Status  09/26/2019 8.7 (H) 3.8 - 8.4 mg/dL Final    Comment:               Therapeutic target for gout patients: <6.0         Failed - Cr in normal range and within 360 days    Creatinine, Ser  Date Value Ref Range Status  07/29/2022 1.42 (H) 0.61 - 1.24 mg/dL Final         Failed - CBC within normal limits and completed in the last 12 months    WBC  Date Value Ref Range Status  05/29/2022 4.7 3.4 - 10.8 x10E3/uL Final  05/16/2021 4.9 4.0 - 10.5 K/uL Final   RBC  Date Value Ref Range Status  05/29/2022 5.19 4.14 - 5.80 x10E6/uL Final  05/16/2021 4.77 4.22 - 5.81 MIL/uL Final   Hemoglobin  Date Value Ref Range Status  05/29/2022 15.6 13.0 - 17.7 g/dL Final   Hematocrit  Date Value Ref Range Status  05/29/2022 47.2 37.5 - 51.0 % Final   MCHC  Date Value Ref Range Status  05/29/2022 33.1 31.5 - 35.7 g/dL Final  78/29/5621 30.8 30.0 - 36.0 g/dL Final   [facility]  Date Value Ref Range Status  05/29/2022 30.1 26.6 - 33.0 pg Final  05/16/2021 30.0 26.0 - 34.0 pg Final   MCV  Date Value Ref Range Status  05/29/2022 91 79 - 97 fL Final   No results found for: "PLTCOUNTKUC", "LABPLAT", "POCPLA" RDW  Date Value Ref Range Status  05/29/2022 13.5 11.6 - 15.4 % Final         Passed - Valid encounter within last 12 months    Recent Outpatient Visits           4 months ago Essential hypertension   [place] [facility] Storm Frisk, MD   7 months ago Encounter for [employer] annual wellness exam   [place] [facility] Storm Frisk, MD   8 months ago Chronic congestive heart failure,  unspecified heart failure type [facility])   [place] [facility] [place], Marylene Land [place], [place]   11 months ago Essential hypertension    [facility] Storm Frisk, MD   1 year ago Dysuria   [facility] Storm Frisk, MD

## 2022-11-27 NOTE — Telephone Encounter (Signed)
Requested medications are due for refill today.  unsure  Requested medications are on the active medications list.  yes  Last refill. 02/17/2022 #30 3 rf  Future visit scheduled.   no  Notes to clinic.  Pt has 2 diuretics on med list. Please review for refill.    Requested Prescriptions  Pending Prescriptions Disp Refills   torsemide (DEMADEX) 20 MG tablet 30 tablet 3    Sig: Take 1 tablet (20 mg total) by mouth as needed.     Cardiovascular:  Diuretics - Loop Failed - 11/27/2022 11:20 AM      Failed - Cr in normal range and within 180 days    Creatinine, Ser  Date Value Ref Range Status  07/29/2022 1.42 (H) 0.61 - 1.24 mg/dL Final         Failed - Mg Level in normal range and within 180 days    Magnesium  Date Value Ref Range Status  08/30/2020 1.7 1.7 - 2.4 mg/dL Final    Comment:    Performed at [facility], [address] [place], [place] [address]         Passed - K in normal range and within 180 days    Potassium  Date Value Ref Range Status  07/29/2022 4.2 3.5 - 5.1 mmol/L Final         Passed - Ca in normal range and within 180 days    Calcium  Date Value Ref Range Status  07/29/2022 9.6 8.9 - 10.3 mg/dL Final   Calcium, Ion  Date Value Ref Range Status  06/28/2021 1.09 (L) 1.15 - 1.40 mmol/L Final         Passed - Na in normal range and within 180 days    Sodium  Date Value Ref Range Status  07/29/2022 138 135 - 145 mmol/L Final  05/29/2022 141 134 - 144 mmol/L Final         Passed - Cl in normal range and within 180 days    Chloride  Date Value Ref Range Status  07/29/2022 103 98 - 111 mmol/L Final         Passed - Last BP in normal range    BP Readings from Last 1 Encounters:  10/16/22 112/72         Passed - Valid encounter within last 6 months    Recent Outpatient Visits           4 months ago Essential hypertension   [facility] Storm Frisk, MD   7 months ago Encounter for [employer]  annual wellness exam   [facility] Storm Frisk, MD   8 months ago Chronic congestive heart failure, unspecified heart failure type [facility])   [facility] [place], [place], [place]   11 months ago Essential hypertension   [facility] Storm Frisk, MD   1 year ago Dysuria   [facility] Storm Frisk, MD

## 2022-11-28 ENCOUNTER — Other Ambulatory Visit (HOSPITAL_COMMUNITY): Payer: Self-pay

## 2022-11-28 ENCOUNTER — Other Ambulatory Visit: Payer: Self-pay | Admitting: Internal Medicine

## 2022-11-28 ENCOUNTER — Other Ambulatory Visit: Payer: Self-pay

## 2022-12-02 ENCOUNTER — Telehealth (HOSPITAL_COMMUNITY): Payer: Self-pay

## 2022-12-02 NOTE — Telephone Encounter (Signed)
Advanced Heart Failure Patient Advocate Encounter  Prior authorization has been submitted for Jardiance. Additional information requested by [facility] Documentation faxed on 7/30.  Key [id number]  Burnell Blanks, CPhT Rx Patient Advocate Phone: [phone]

## 2022-12-03 ENCOUNTER — Other Ambulatory Visit (HOSPITAL_COMMUNITY): Payer: Self-pay

## 2022-12-03 ENCOUNTER — Other Ambulatory Visit: Payer: Self-pay

## 2022-12-03 NOTE — Telephone Encounter (Signed)
Prior authorization has been approved for Jardiance with [facility], effective 12/03/2022 to 12/03/2023. Test billing shows $569.94 for 30 day supply.  Rx has been updated in [facility] to bill to Rule, as the copay is $0.

## 2022-12-08 ENCOUNTER — Other Ambulatory Visit: Payer: Self-pay

## 2022-12-08 MED ORDER — TORSEMIDE 20 MG PO TABS
20.0000 mg | ORAL_TABLET | Freq: Every day | ORAL | 6 refills | Status: DC
  Filled 2022-12-08 – 2023-04-21 (×2): qty 30, 30d supply, fill #0

## 2022-12-09 ENCOUNTER — Other Ambulatory Visit: Payer: Self-pay

## 2022-12-10 ENCOUNTER — Ambulatory Visit: Payer: Medicare HMO | Attending: Critical Care Medicine
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
## 2022-12-15 ENCOUNTER — Other Ambulatory Visit: Payer: Self-pay

## 2022-12-15 ENCOUNTER — Telehealth: Payer: Self-pay

## 2022-12-15 NOTE — Telephone Encounter (Signed)
Following alert received from [employer] received for NSVT x7, VT-1 x8, all from 8/10, longest duration 12sec EGM's atrial driven, hx of PAF, Eliquis, Amiodrone, Coreg, Metoprolol per EPIC.  Corvue with downward trend.  Attempted to contact patient for symptoms/medication compliance. No answer, LMTCB.

## 2022-12-17 ENCOUNTER — Ambulatory Visit (INDEPENDENT_AMBULATORY_CARE_PROVIDER_SITE_OTHER): Payer: Medicare HMO

## 2022-12-17 DIAGNOSIS — I4510 Unspecified right bundle-branch block: Secondary | ICD-10-CM | POA: Diagnosis not present

## 2022-12-17 LAB — CUP PACEART REMOTE DEVICE CHECK
Battery Remaining Longevity: 68 mo
Battery Remaining Percentage: 87 %
Battery Voltage: 3.07 V
Brady Statistic AP VP Percent: 30 %
Brady Statistic AP VS Percent: 1 %
Brady Statistic AS VP Percent: 67 %
Brady Statistic AS VS Percent: 1 %
Brady Statistic RA Percent Paced: 29 %
Date Time Interrogation Session: 20240814033932
HighPow Impedance: 70 Ohm
HighPow Impedance: 70 Ohm
Implantable Lead Connection Status: 753985
Implantable Lead Connection Status: 753985
Implantable Lead Connection Status: 753985
Implantable Lead Implant Date: 20240201
Implantable Lead Implant Date: 20240201
Implantable Lead Implant Date: 20240201
Implantable Lead Location: 753858
Implantable Lead Location: 753859
Implantable Lead Location: 753860
Implantable Lead Model: 3830
Implantable Lead Model: 7122
Implantable Pulse Generator Implant Date: 20240201
Lead Channel Impedance Value: 430 Ohm
Lead Channel Impedance Value: 560 Ohm
Lead Channel Impedance Value: 610 Ohm
Lead Channel Pacing Threshold Amplitude: 0.75 V
Lead Channel Pacing Threshold Amplitude: 0.75 V
Lead Channel Pacing Threshold Amplitude: 1 V
Lead Channel Pacing Threshold Pulse Width: 0.5 ms
Lead Channel Pacing Threshold Pulse Width: 0.5 ms
Lead Channel Pacing Threshold Pulse Width: 1 ms
Lead Channel Sensing Intrinsic Amplitude: 1.1 mV
Lead Channel Sensing Intrinsic Amplitude: 12 mV
Lead Channel Setting Pacing Amplitude: 2 V
Lead Channel Setting Pacing Amplitude: 2 V
Lead Channel Setting Pacing Amplitude: 2.5 V
Lead Channel Setting Pacing Pulse Width: 0.5 ms
Lead Channel Setting Pacing Pulse Width: 0.5 ms
Lead Channel Setting Sensing Sensitivity: 1 mV
Pulse Gen Serial Number: 5559393
Zone Setting Status: 755011

## 2022-12-17 NOTE — Telephone Encounter (Signed)
Called all numbers listed. Unable to reach pt or LMTCB.

## 2022-12-18 NOTE — Telephone Encounter (Signed)
Contacted all numbers listed.  Unable to leave message on any number.  Sent mychart message requesting Pt call device clinic.

## 2022-12-19 NOTE — Telephone Encounter (Signed)
3 telephone calls have been attempted MyChart msg sent closing encounter

## 2022-12-31 NOTE — Progress Notes (Signed)
Remote ICD transmission.   

## 2023-01-13 ENCOUNTER — Ambulatory Visit: Payer: Medicare HMO | Attending: Critical Care Medicine

## 2023-01-13 VITALS — Ht 68.0 in | Wt 202.0 lb

## 2023-01-13 DIAGNOSIS — Z0000 Encounter for general adult medical examination without abnormal findings: Secondary | ICD-10-CM

## 2023-01-13 NOTE — Patient Instructions (Addendum)
Mr. Cody Guzman , Thank you for taking time to come for your Medicare Wellness Visit. I appreciate your ongoing commitment to your health goals. Please review the following plan we discussed and let me know if I can assist you in the future.   Referrals/Orders/Follow-Ups/Clinician Recommendations: Aim for 30 minutes of exercise or brisk walking, 6-8 glasses of water, and 5 servings of fruits and vegetables each day.  This is a list of the screening recommended for you and due dates:  Health Maintenance  Topic Date Due   Flu Shot  08/03/2023*   Medicare Annual Wellness Visit  01/13/2024   DTaP/Tdap/Td vaccine (3 - Td or Tdap) 06/29/2031   Hepatitis C Screening  Completed   HIV Screening  Completed   HPV Vaccine  Aged Out   Colon Cancer Screening  Discontinued   COVID-19 Vaccine  Discontinued   Cologuard (Stool DNA test)  Discontinued   Zoster (Shingles) Vaccine  Discontinued  *Topic was postponed. The date shown is not the original due date.    Advanced directives: (ACP Link)Information on Advanced Care Planning can be found at [facility] Advance Health Care Directives Advance Health Care Directives ([email or web address]   Next Medicare Annual Wellness Visit scheduled for next year: Yes
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
## 2023-02-11 ENCOUNTER — Ambulatory Visit: Payer: Medicare HMO | Attending: Nurse Practitioner | Admitting: Nurse Practitioner

## 2023-02-11 ENCOUNTER — Encounter: Payer: Self-pay | Admitting: Nurse Practitioner

## 2023-02-11 VITALS — BP 120/80 | HR 82 | Ht 68.0 in | Wt 191.8 lb

## 2023-02-11 DIAGNOSIS — E785 Hyperlipidemia, unspecified: Secondary | ICD-10-CM

## 2023-02-11 DIAGNOSIS — I4892 Unspecified atrial flutter: Secondary | ICD-10-CM

## 2023-02-11 DIAGNOSIS — G894 Chronic pain syndrome: Secondary | ICD-10-CM | POA: Diagnosis not present

## 2023-02-11 DIAGNOSIS — I10 Essential (primary) hypertension: Secondary | ICD-10-CM | POA: Diagnosis not present

## 2023-02-11 DIAGNOSIS — M1A09X0 Idiopathic chronic gout, multiple sites, without tophus (tophi): Secondary | ICD-10-CM | POA: Diagnosis not present

## 2023-02-11 DIAGNOSIS — G4733 Obstructive sleep apnea (adult) (pediatric): Secondary | ICD-10-CM | POA: Diagnosis not present

## 2023-02-11 DIAGNOSIS — F4310 Post-traumatic stress disorder, unspecified: Secondary | ICD-10-CM | POA: Diagnosis not present

## 2023-02-11 DIAGNOSIS — R350 Frequency of micturition: Secondary | ICD-10-CM | POA: Diagnosis not present

## 2023-02-11 DIAGNOSIS — I5022 Chronic systolic (congestive) heart failure: Secondary | ICD-10-CM

## 2023-02-11 DIAGNOSIS — I509 Heart failure, unspecified: Secondary | ICD-10-CM

## 2023-02-11 DIAGNOSIS — N1831 Chronic kidney disease, stage 3a: Secondary | ICD-10-CM

## 2023-02-11 MED ORDER — TAMSULOSIN HCL 0.4 MG PO CAPS
0.4000 mg | ORAL_CAPSULE | Freq: Every day | ORAL | 1 refills | Status: DC
Start: 2023-02-11 — End: 2023-05-15

## 2023-02-11 MED ORDER — METOPROLOL SUCCINATE ER 50 MG PO TB24: 50.0000 mg | ORAL_TABLET | Freq: Every day | ORAL | 1 refills | Status: DC

## 2023-02-11 MED ORDER — SPIRONOLACTONE 25 MG PO TABS: 25.0000 mg | ORAL_TABLET | Freq: Every day | ORAL | 1 refills | Status: DC

## 2023-02-11 MED ORDER — QUETIAPINE FUMARATE ER 300 MG PO TB24: 300.0000 mg | ORAL_TABLET | Freq: Every day | ORAL | 1 refills | Status: DC

## 2023-02-11 MED ORDER — APIXABAN 5 MG PO TABS
5.0000 mg | ORAL_TABLET | Freq: Two times a day (BID) | ORAL | 1 refills | Status: DC
Start: 2023-02-11 — End: 2023-05-15

## 2023-02-11 MED ORDER — ALLOPURINOL 100 MG PO TABS: 100.0000 mg | ORAL_TABLET | Freq: Every day | ORAL | 1 refills | Status: DC

## 2023-02-11 MED ORDER — IRBESARTAN 75 MG PO TABS: 37.5000 mg | ORAL_TABLET | Freq: Every day | ORAL | 0 refills | Status: DC

## 2023-02-11 MED ORDER — SENNOSIDES 8.6 MG PO TABS
1.0000 | ORAL_TABLET | Freq: Every day | ORAL | 1 refills | Status: DC
Start: 2023-02-11 — End: 2024-01-21

## 2023-02-11 MED ORDER — ATORVASTATIN CALCIUM 80 MG PO TABS
80.0000 mg | ORAL_TABLET | Freq: Every day | ORAL | 1 refills | Status: DC
Start: 2023-02-11 — End: 2023-05-15
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
## 2023-02-12 LAB — BRAIN NATRIURETIC PEPTIDE: BNP: 208.6 pg/mL — ABNORMAL HIGH (ref 0.0–100.0)

## 2023-02-12 LAB — CMP14+EGFR
ALT: 14 [IU]/L (ref 0–44)
AST: 20 [IU]/L (ref 0–40)
Albumin: 4.3 g/dL (ref 3.8–4.9)
Alkaline Phosphatase: 70 [IU]/L (ref 44–121)
BUN/Creatinine Ratio: 9 (ref 9–20)
BUN: 11 mg/dL (ref 6–24)
Bilirubin Total: 0.7 mg/dL (ref 0.0–1.2)
CO2: 25 mmol/L (ref 20–29)
Calcium: 9.9 mg/dL (ref 8.7–10.2)
Chloride: 101 mmol/L (ref 96–106)
Creatinine, Ser: 1.17 mg/dL (ref 0.76–1.27)
Globulin, Total: 2.7 g/dL (ref 1.5–4.5)
Glucose: 87 mg/dL (ref 70–99)
Potassium: 3.4 mmol/L — ABNORMAL LOW (ref 3.5–5.2)
Sodium: 142 mmol/L (ref 134–144)
Total Protein: 7 g/dL (ref 6.0–8.5)
eGFR: 72 mL/min/{1.73_m2} (ref >59)

## 2023-02-12 LAB — LIPID PANEL
Chol/HDL Ratio: 2.9 {ratio} (ref 0.0–5.0)
Cholesterol, Total: 196 mg/dL (ref 100–199)
HDL: 67 mg/dL (ref >39)
LDL Chol Calc (NIH): 115 mg/dL — ABNORMAL HIGH (ref 0–99)
Triglycerides: 79 mg/dL (ref 0–149)
VLDL Cholesterol Cal: 14 mg/dL (ref 5–40)

## 2023-02-12 LAB — URIC ACID: Uric Acid: 6.1 mg/dL (ref 3.8–8.4)

## 2023-02-25 ENCOUNTER — Other Ambulatory Visit: Payer: Self-pay

## 2023-03-02 ENCOUNTER — Encounter: Payer: Self-pay | Admitting: Nurse Practitioner

## 2023-03-18 ENCOUNTER — Ambulatory Visit (INDEPENDENT_AMBULATORY_CARE_PROVIDER_SITE_OTHER): Payer: Medicare HMO

## 2023-03-18 DIAGNOSIS — I428 Other cardiomyopathies: Secondary | ICD-10-CM

## 2023-03-18 LAB — CUP PACEART REMOTE DEVICE CHECK
Battery Remaining Longevity: 65 mo
Battery Remaining Percentage: 83 %
Battery Voltage: 3.04 V
Brady Statistic AP VP Percent: 47 %
Brady Statistic AP VS Percent: 1 %
Brady Statistic AS VP Percent: 50 %
Brady Statistic AS VS Percent: 1 %
Brady Statistic RA Percent Paced: 46 %
Date Time Interrogation Session: 20241113031302
HighPow Impedance: 68 Ohm
HighPow Impedance: 68 Ohm
Implantable Lead Connection Status: 753985
Implantable Lead Connection Status: 753985
Implantable Lead Connection Status: 753985
Implantable Lead Implant Date: 20240201
Implantable Lead Implant Date: 20240201
Implantable Lead Implant Date: 20240201
Implantable Lead Location: 753858
Implantable Lead Location: 753859
Implantable Lead Location: 753860
Implantable Lead Model: 3830
Implantable Lead Model: 7122
Implantable Pulse Generator Implant Date: 20240201
Lead Channel Impedance Value: 430 Ohm
Lead Channel Impedance Value: 580 Ohm
Lead Channel Impedance Value: 610 Ohm
Lead Channel Pacing Threshold Amplitude: 0.75 V
Lead Channel Pacing Threshold Amplitude: 0.75 V
Lead Channel Pacing Threshold Amplitude: 1 V
Lead Channel Pacing Threshold Pulse Width: 0.5 ms
Lead Channel Pacing Threshold Pulse Width: 0.5 ms
Lead Channel Pacing Threshold Pulse Width: 1 ms
Lead Channel Sensing Intrinsic Amplitude: 1.3 mV
Lead Channel Sensing Intrinsic Amplitude: 12 mV
Lead Channel Setting Pacing Amplitude: 2 V
Lead Channel Setting Pacing Amplitude: 2 V
Lead Channel Setting Pacing Amplitude: 2.5 V
Lead Channel Setting Pacing Pulse Width: 0.5 ms
Lead Channel Setting Pacing Pulse Width: 0.5 ms
Lead Channel Setting Sensing Sensitivity: 1 mV
Pulse Gen Serial Number: 5559393
Zone Setting Status: 755011

## 2023-04-07 NOTE — Progress Notes (Signed)
Remote ICD transmission.   

## 2023-04-21 ENCOUNTER — Other Ambulatory Visit: Payer: Self-pay | Admitting: Nurse Practitioner

## 2023-04-21 ENCOUNTER — Other Ambulatory Visit: Payer: Self-pay

## 2023-04-21 ENCOUNTER — Other Ambulatory Visit: Payer: Self-pay | Admitting: Critical Care Medicine

## 2023-04-21 DIAGNOSIS — G8929 Other chronic pain: Secondary | ICD-10-CM

## 2023-04-21 DIAGNOSIS — J4520 Mild intermittent asthma, uncomplicated: Secondary | ICD-10-CM

## 2023-04-21 DIAGNOSIS — I10 Essential (primary) hypertension: Secondary | ICD-10-CM

## 2023-04-21 MED ORDER — ALBUTEROL SULFATE HFA 108 (90 BASE) MCG/ACT IN AERS: 1.0000 | INHALATION_SPRAY | Freq: Four times a day (QID) | RESPIRATORY_TRACT | 1 refills | Status: DC | PRN

## 2023-04-23 MED ORDER — IRBESARTAN 75 MG PO TABS: 37.5000 mg | ORAL_TABLET | Freq: Every day | ORAL | 0 refills | Status: DC

## 2023-04-30 ENCOUNTER — Other Ambulatory Visit: Payer: Self-pay

## 2023-05-15 ENCOUNTER — Other Ambulatory Visit: Payer: Self-pay

## 2023-05-15 ENCOUNTER — Encounter: Payer: Self-pay | Admitting: Nurse Practitioner

## 2023-05-15 ENCOUNTER — Ambulatory Visit: Payer: No Typology Code available for payment source | Attending: Nurse Practitioner | Admitting: Nurse Practitioner

## 2023-05-15 DIAGNOSIS — J3089 Other allergic rhinitis: Secondary | ICD-10-CM

## 2023-05-15 DIAGNOSIS — N401 Enlarged prostate with lower urinary tract symptoms: Secondary | ICD-10-CM | POA: Diagnosis not present

## 2023-05-15 DIAGNOSIS — I5022 Chronic systolic (congestive) heart failure: Secondary | ICD-10-CM | POA: Diagnosis not present

## 2023-05-15 DIAGNOSIS — I10 Essential (primary) hypertension: Secondary | ICD-10-CM | POA: Diagnosis not present

## 2023-05-15 DIAGNOSIS — F4310 Post-traumatic stress disorder, unspecified: Secondary | ICD-10-CM

## 2023-05-15 DIAGNOSIS — E785 Hyperlipidemia, unspecified: Secondary | ICD-10-CM | POA: Diagnosis not present

## 2023-05-15 DIAGNOSIS — R350 Frequency of micturition: Secondary | ICD-10-CM | POA: Diagnosis not present

## 2023-05-15 DIAGNOSIS — F17210 Nicotine dependence, cigarettes, uncomplicated: Secondary | ICD-10-CM

## 2023-05-15 DIAGNOSIS — M1A09X0 Idiopathic chronic gout, multiple sites, without tophus (tophi): Secondary | ICD-10-CM

## 2023-05-15 DIAGNOSIS — Z8673 Personal history of transient ischemic attack (TIA), and cerebral infarction without residual deficits: Secondary | ICD-10-CM | POA: Diagnosis not present

## 2023-05-15 DIAGNOSIS — I4892 Unspecified atrial flutter: Secondary | ICD-10-CM | POA: Diagnosis not present

## 2023-05-15 DIAGNOSIS — I25119 Atherosclerotic heart disease of native coronary artery with unspecified angina pectoris: Secondary | ICD-10-CM

## 2023-05-15 MED ORDER — SPIRONOLACTONE 25 MG PO TABS: 25.0000 mg | ORAL_TABLET | Freq: Every day | ORAL | 1 refills | Status: DC

## 2023-05-15 MED ORDER — AMIODARONE HCL 100 MG PO TABS: 100.0000 mg | ORAL_TABLET | Freq: Every day | ORAL | 1 refills | Status: DC

## 2023-05-15 MED ORDER — EMPAGLIFLOZIN 10 MG PO TABS: 10.0000 mg | ORAL_TABLET | Freq: Every day | ORAL | 3 refills | Status: DC

## 2023-05-15 MED ORDER — ALBUTEROL SULFATE (2.5 MG/3ML) 0.083% IN NEBU: 2.5000 mg | INHALATION_SOLUTION | RESPIRATORY_TRACT | 10 refills | Status: DC | PRN

## 2023-05-15 MED ORDER — IRBESARTAN 75 MG PO TABS: 37.5000 mg | ORAL_TABLET | Freq: Every day | ORAL | 0 refills | Status: DC

## 2023-05-15 MED ORDER — SUMATRIPTAN SUCCINATE 50 MG PO TABS: 50.0000 mg | ORAL_TABLET | Freq: Once | ORAL | 0 refills | Status: DC

## 2023-05-15 MED ORDER — TAMSULOSIN HCL 0.4 MG PO CAPS: 0.4000 mg | ORAL_CAPSULE | Freq: Every day | ORAL | 1 refills | Status: DC

## 2023-05-15 MED ORDER — LITHIUM CARBONATE ER 300 MG PO TBCR: 300.0000 mg | EXTENDED_RELEASE_TABLET | Freq: Two times a day (BID) | ORAL | 1 refills | Status: DC

## 2023-05-15 MED ORDER — METOPROLOL SUCCINATE ER 50 MG PO TB24: 50.0000 mg | ORAL_TABLET | Freq: Every day | ORAL | 1 refills | Status: DC

## 2023-05-15 MED ORDER — NITROGLYCERIN 0.4 MG SL SUBL: 0.4000 mg | SUBLINGUAL_TABLET | SUBLINGUAL | 3 refills | Status: DC | PRN

## 2023-05-15 MED ORDER — ATORVASTATIN CALCIUM 80 MG PO TABS: 80.0000 mg | ORAL_TABLET | Freq: Every day | ORAL | 1 refills | Status: DC

## 2023-05-15 MED ORDER — ALLOPURINOL 100 MG PO TABS: 100.0000 mg | ORAL_TABLET | Freq: Every day | ORAL | 1 refills | Status: DC

## 2023-05-15 MED ORDER — APIXABAN 5 MG PO TABS: 5.0000 mg | ORAL_TABLET | Freq: Two times a day (BID) | ORAL | 1 refills | Status: DC

## 2023-05-15 MED ORDER — LORATADINE 10 MG PO TABS
10.0000 mg | ORAL_TABLET | Freq: Every day | ORAL | 3 refills | Status: DC
Start: 2023-05-15 — End: 2024-01-21
  Filled 2023-05-15: qty 90, 90d supply, fill #0
  Filled 2023-09-15: qty 90, 90d supply, fill #1

## 2023-05-15 MED ORDER — TORSEMIDE 20 MG PO TABS: 20.0000 mg | ORAL_TABLET | Freq: Every day | ORAL | 6 refills | Status: DC

## 2023-05-15 MED ORDER — POTASSIUM CHLORIDE CRYS ER 20 MEQ PO TBCR: 20.0000 meq | EXTENDED_RELEASE_TABLET | Freq: Every day | ORAL | 3 refills | Status: DC

## 2023-05-15 MED ORDER — QUETIAPINE FUMARATE ER 300 MG PO TB24: 300.0000 mg | ORAL_TABLET | Freq: Every day | ORAL | 1 refills | Status: DC

## 2023-05-15 MED ORDER — FLUTICASONE PROPIONATE 50 MCG/ACT NA SUSP
2.0000 | Freq: Every day | NASAL | 6 refills | Status: DC
Start: 2023-05-15 — End: 2024-01-21
  Filled 2023-05-15: qty 16, 30d supply, fill #0
  Filled 2023-07-13: qty 16, 30d supply, fill #1
  Filled 2023-09-15: qty 16, 30d supply, fill #2

## 2023-05-15 MED ORDER — TOPIRAMATE 25 MG PO CPSP: 25.0000 mg | ORAL_CAPSULE | Freq: Every day | ORAL | 1 refills | Status: DC

## 2023-05-15 NOTE — Patient Instructions (Addendum)
 [facility] [place], [place]  [phone] Open 24 hours
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
## 2023-05-18 ENCOUNTER — Other Ambulatory Visit: Payer: Self-pay

## 2023-05-18 ENCOUNTER — Other Ambulatory Visit (HOSPITAL_COMMUNITY): Payer: Self-pay

## 2023-06-08 ENCOUNTER — Telehealth: Payer: Self-pay

## 2023-06-08 NOTE — Telephone Encounter (Signed)
Alert received from [employer] for 24 VT/VF events, available EGM's c/w atrial driven 1:1, HR's 170's.  HR trends have spiked - route to triage 30 NSVT. HF diagnostics currently abnormal.   Attempted to contact patient to assess for any symptoms. No answer, LMTCB.

## 2023-06-10 NOTE — Telephone Encounter (Signed)
 Attempted to contact Pt.  Phone rang and rang with no answer/no voicemail.  Will send mychart message.  Pt is due for follow up.

## 2023-06-10 NOTE — Telephone Encounter (Signed)
 Appears to be Atrial flutter-- recurrent although rhythm has been stable for the last year, atrial flutter with rapid i.e. 1: 1 conduction and inappropriate therapy

## 2023-06-10 NOTE — Telephone Encounter (Signed)
 Kem can we plz arrange a remote and if still out of rhythm, can we arrange AFib clinic for DCC[facility] Thanks SK

## 2023-06-11 NOTE — Telephone Encounter (Signed)
I called the pt no answer\no voicemail.

## 2023-06-12 NOTE — Telephone Encounter (Signed)
 Called all numbers listed. Unable to leave VM. Number on DPR not in service. Certified letter sent.

## 2023-06-15 ENCOUNTER — Telehealth: Payer: Self-pay

## 2023-06-15 NOTE — Telephone Encounter (Signed)
 Alert remote transmission: BiV Percent Pacing Less Than Limit and VT/VF episodes.  BiV Pacing 94%, trends show significant decrease in V Pacing with increased Night HR. (Patient notes increased SOB during nighttime hours, previously noted in separate phone enc).   Presenting EGMs c/w an atrial arrhythmia with atrial undersensing, HR 90-130's, hx of AFL and on Eliquis  per Epic; routed to clinic for review of atrial undersensing with ongoing atrial arrhythmia (2 alert transmissions received on 2/08 and 06/14/23, both PDFs attached).  2.  8 VT-1 / NSVT classified episodes recorded, longest 42 sec on 06/13/23 at 8:06 pm, available EGMs c/w 1:1 atrial driven tachycardia at 180-187 bpm.  3.  HF diagnostics currently abnormal.    (See Dr. Doyle Generous note from 06/10/23:  "Appears to be Atrial flutter-- recurrent although rhythm has been stable for the last year, atrial flutter with rapid i.e. 1: 1 conduction and inappropriate therapy.    if still out of rhythm, can we arrange AFib clinic for DCCV Thanks SK    2/10- still in aflutter with ongoing fast 1:1 conduction events. Need to set up AF clinic for DCCV. Also need to address his atrial undersensing and competitive pacing.   NOTE: Multiple attempts made to reach patient (both of his numbers are invalid), Attempt X 2 to reach [facility]) and left a message.  Certified letter has also been sent.

## 2023-06-16 ENCOUNTER — Emergency Department (HOSPITAL_COMMUNITY): Payer: Medicare HMO

## 2023-06-16 ENCOUNTER — Other Ambulatory Visit: Payer: Self-pay

## 2023-06-16 ENCOUNTER — Emergency Department (HOSPITAL_COMMUNITY)
Admission: EM | Admit: 2023-06-16 | Discharge: 2023-06-16 | Disposition: A | Discharge status: Home / Self Care | Payer: Medicare HMO | Attending: Emergency Medicine | Admitting: Emergency Medicine

## 2023-06-16 ENCOUNTER — Encounter (HOSPITAL_COMMUNITY): Payer: Self-pay

## 2023-06-16 DIAGNOSIS — Z7901 Long term (current) use of anticoagulants: Secondary | ICD-10-CM | POA: Insufficient documentation

## 2023-06-16 DIAGNOSIS — K5900 Constipation, unspecified: Secondary | ICD-10-CM | POA: Insufficient documentation

## 2023-06-16 DIAGNOSIS — Z8572 Personal history of non-Hodgkin lymphomas: Secondary | ICD-10-CM | POA: Diagnosis not present

## 2023-06-16 DIAGNOSIS — I2510 Atherosclerotic heart disease of native coronary artery without angina pectoris: Secondary | ICD-10-CM | POA: Diagnosis not present

## 2023-06-16 DIAGNOSIS — Z79899 Other long term (current) drug therapy: Secondary | ICD-10-CM | POA: Diagnosis not present

## 2023-06-16 DIAGNOSIS — I130 Hypertensive heart and chronic kidney disease with heart failure and stage 1 through stage 4 chronic kidney disease, or unspecified chronic kidney disease: Secondary | ICD-10-CM | POA: Diagnosis not present

## 2023-06-16 DIAGNOSIS — R109 Unspecified abdominal pain: Secondary | ICD-10-CM

## 2023-06-16 DIAGNOSIS — N189 Chronic kidney disease, unspecified: Secondary | ICD-10-CM | POA: Diagnosis not present

## 2023-06-16 DIAGNOSIS — I509 Heart failure, unspecified: Secondary | ICD-10-CM | POA: Insufficient documentation

## 2023-06-16 DIAGNOSIS — J4520 Mild intermittent asthma, uncomplicated: Secondary | ICD-10-CM | POA: Diagnosis not present

## 2023-06-16 DIAGNOSIS — N289 Disorder of kidney and ureter, unspecified: Secondary | ICD-10-CM | POA: Diagnosis not present

## 2023-06-16 DIAGNOSIS — Z7951 Long term (current) use of inhaled steroids: Secondary | ICD-10-CM | POA: Insufficient documentation

## 2023-06-16 DIAGNOSIS — K429 Umbilical hernia without obstruction or gangrene: Secondary | ICD-10-CM | POA: Diagnosis not present

## 2023-06-16 DIAGNOSIS — E876 Hypokalemia: Secondary | ICD-10-CM | POA: Insufficient documentation

## 2023-06-16 DIAGNOSIS — K760 Fatty (change of) liver, not elsewhere classified: Secondary | ICD-10-CM | POA: Diagnosis not present

## 2023-06-16 LAB — COMPREHENSIVE METABOLIC PANEL
ALT: 57 U/L — ABNORMAL HIGH (ref 0–44)
AST: 33 U/L (ref 15–41)
Albumin: 3.9 g/dL (ref 3.5–5.0)
Alkaline Phosphatase: 89 U/L (ref 38–126)
Anion gap: 11 (ref 5–15)
BUN: 13 mg/dL (ref 6–20)
CO2: 25 mmol/L (ref 22–32)
Calcium: 9.5 mg/dL (ref 8.9–10.3)
Chloride: 103 mmol/L (ref 98–111)
Creatinine, Ser: 1.32 mg/dL — ABNORMAL HIGH (ref 0.61–1.24)
GFR, Estimated: >60 mL/min (ref >60)
Glucose, Bld: 101 mg/dL — ABNORMAL HIGH (ref 70–99)
Potassium: 3.1 mmol/L — ABNORMAL LOW (ref 3.5–5.1)
Sodium: 139 mmol/L (ref 135–145)
Total Bilirubin: 1.6 mg/dL — ABNORMAL HIGH (ref 0.0–1.2)
Total Protein: 7.5 g/dL (ref 6.5–8.1)

## 2023-06-16 LAB — CBC
HCT: 41 % (ref 39.0–52.0)
Hemoglobin: 13.1 g/dL (ref 13.0–17.0)
MCH: 28.7 pg (ref 26.0–34.0)
MCHC: 32 g/dL (ref 30.0–36.0)
MCV: 89.9 fL (ref 80.0–100.0)
Platelets: 307 10*3/uL (ref 150–400)
RBC: 4.56 MIL/uL (ref 4.22–5.81)
RDW: 15.5 % (ref 11.5–15.5)
WBC: 6.8 10*3/uL (ref 4.0–10.5)
nRBC: 0 % (ref 0.0–0.2)

## 2023-06-16 LAB — URINALYSIS, ROUTINE W REFLEX MICROSCOPIC
Bacteria, UA: NONE SEEN
Bilirubin Urine: NEGATIVE
Glucose, UA: NEGATIVE mg/dL
Hgb urine dipstick: NEGATIVE
Ketones, ur: NEGATIVE mg/dL
Leukocytes,Ua: NEGATIVE
Nitrite: NEGATIVE
Protein, ur: 100 mg/dL — AB
Specific Gravity, Urine: 1.017 (ref 1.005–1.030)
pH: 6 (ref 5.0–8.0)

## 2023-06-16 LAB — LIPASE, BLOOD: Lipase: 36 U/L (ref 11–51)

## 2023-06-16 MED ORDER — IOHEXOL 350 MG/ML SOLN
75.0000 mL | Freq: Once | INTRAVENOUS | Status: AC | PRN
  Administered 2023-06-16: 75 mL via INTRAVENOUS

## 2023-06-16 MED ORDER — POTASSIUM CHLORIDE CRYS ER 20 MEQ PO TBCR
40.0000 meq | EXTENDED_RELEASE_TABLET | Freq: Once | ORAL | Status: AC
  Administered 2023-06-16: 40 meq via ORAL
  Filled 2023-06-16: qty 2

## 2023-06-16 MED ORDER — POLYETHYLENE GLYCOL 3350 17 G PO PACK: 17.0000 g | PACK | Freq: Every day | ORAL | 0 refills | Status: AC

## 2023-06-16 MED ORDER — GLYCERIN (LAXATIVE) 2 G RE SUPP
1.0000 | Freq: Once | RECTAL | Status: AC
  Administered 2023-06-16: 1 via RECTAL
  Filled 2023-06-16: qty 1

## 2023-06-16 NOTE — Discharge Instructions (Addendum)
Thank you for coming to [facility] Emergency Department. You were seen for abdominal pain, constipation. We did an exam, labs, and imaging, and these showed: -thickening of the colon. Please follow up with a gastroenterologist for a colonoscopy. -a lesion on the kidney which has gotten slightly larger since 2022. Please follow up with your primary care provider for dedicated kidney imaging.   -Mildly low potassium, likely from drinking alcohol and not eating well  For your constipation, please take colace twice per day and use 1-2 packets of miralax per day. Please stay well hydrated with water.   Please follow up with your primary care provider within 1 week.   Do not hesitate to return to the ED or call 911 if you experience: -Worsening symptoms -Worsening abdominal pain -Blood in your stool -Lightheadedness, passing out -Fevers/chills -Anything else that concerns you

## 2023-06-16 NOTE — ED Triage Notes (Signed)
Reports no bm in a week.  Reports he has been drinking water and taking natural laxatives with no results.  Reports its causing him to get dizzy and have abd pain and nausea.
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
## 2023-06-17 ENCOUNTER — Telehealth: Payer: Self-pay

## 2023-06-17 ENCOUNTER — Ambulatory Visit (INDEPENDENT_AMBULATORY_CARE_PROVIDER_SITE_OTHER): Payer: Medicaid Other

## 2023-06-17 DIAGNOSIS — I4510 Unspecified right bundle-branch block: Secondary | ICD-10-CM

## 2023-06-17 LAB — CUP PACEART REMOTE DEVICE CHECK
Battery Remaining Longevity: 61 mo
Battery Remaining Percentage: 80 %
Battery Voltage: 3.01 V
Brady Statistic AP VP Percent: 47 %
Brady Statistic AP VS Percent: 1.3 %
Brady Statistic AS VP Percent: 46 %
Brady Statistic AS VS Percent: 1 %
Brady Statistic RA Percent Paced: 46 %
Date Time Interrogation Session: 20250212034722
HighPow Impedance: 56 Ohm
HighPow Impedance: 65 Ohm
Implantable Lead Connection Status: 753985
Implantable Lead Connection Status: 753985
Implantable Lead Connection Status: 753985
Implantable Lead Implant Date: 20240201
Implantable Lead Implant Date: 20240201
Implantable Lead Implant Date: 20240201
Implantable Lead Location: 753858
Implantable Lead Location: 753859
Implantable Lead Location: 753860
Implantable Lead Model: 3830
Implantable Lead Model: 7122
Implantable Pulse Generator Implant Date: 20240201
Lead Channel Impedance Value: 390 Ohm
Lead Channel Impedance Value: 430 Ohm
Lead Channel Impedance Value: 560 Ohm
Lead Channel Pacing Threshold Amplitude: 0.75 V
Lead Channel Pacing Threshold Amplitude: 0.75 V
Lead Channel Pacing Threshold Amplitude: 1 V
Lead Channel Pacing Threshold Pulse Width: 0.5 ms
Lead Channel Pacing Threshold Pulse Width: 0.5 ms
Lead Channel Pacing Threshold Pulse Width: 1 ms
Lead Channel Sensing Intrinsic Amplitude: 1 mV
Lead Channel Sensing Intrinsic Amplitude: 9.7 mV
Lead Channel Setting Pacing Amplitude: 2 V
Lead Channel Setting Pacing Amplitude: 2 V
Lead Channel Setting Pacing Amplitude: 2.5 V
Lead Channel Setting Pacing Pulse Width: 0.5 ms
Lead Channel Setting Pacing Pulse Width: 0.5 ms
Lead Channel Setting Sensing Sensitivity: 1 mV
Pulse Gen Serial Number: 5559393
Zone Setting Status: 755011

## 2023-06-17 NOTE — Telephone Encounter (Signed)
Presenting EGM with P wave undersensing.  Pacing mode DDDR.  AT on AEGM with VS (not BIV pacing)  Known suboptimal BIV pacing with recent large decrease with BIV pacing ~20%.  Recent increase in heart rates via trenidng.   Since 2/9, 11 total new VT-1 or NSVT events, longest x 54 sec, V rates 170-180s.   Few EGMs to view with atrial undersensing and appears to have 1:1 AV and atrial driven tachy.  (SEE 06/15/23 PHONE ENC).   Multiple failed attempts to reach patient.  Needs clinic appt.  Currently in AT per EGM on 2/11.  In ER yesterday for constipation/abdominal pain.

## 2023-06-18 NOTE — Telephone Encounter (Signed)
Certified letter sent after multiple attempts to reach Pt by phone and mychart messages.  Mychart messages were read.  Await response from patient.

## 2023-06-18 NOTE — Telephone Encounter (Signed)
Certified letter sent after multiple attempts to reach Pt by phone and mychart messages.  Mychart messages were read.

## 2023-06-22 ENCOUNTER — Telehealth: Payer: Self-pay

## 2023-06-22 ENCOUNTER — Telehealth: Payer: Self-pay | Admitting: Nurse Practitioner

## 2023-06-22 NOTE — Telephone Encounter (Signed)
 Schedule VV

## 2023-06-22 NOTE — Telephone Encounter (Signed)
Multiple Alerts received from [employer] for  Alert remote transmission: VT/VF classified episodes.   Presenting EGM c/w AS/VS at 166 bpm on 06/20/23 at 03:48, routed to clinic for review. 30 VT-NS and 54 VT-1 classified episodes between 06/19/23 at 3:40 pm and 06/20/23 at 3:38 am, longest 4 min 40 sec, available EGMs c/w 1:1 atrial driven tachycardia with occasional non-conducted PAC, V rates 171-179 bpm; routed to clinic for review of increased V rates.  Follow up as scheduled. [facility], [facility]  Alert remote transmission: VT/VF classified episodes.   Presenting EGM c/w AFL/VS, HR 80's; hx of AFL and on Eliquis per Epic, routed to clinic for review of ongoing AFL. 122 VT-NS / VT-1 classified episodes between 06/20/23 at 2:52 am and 06/21/23 at 10:49 am, longest 7 min 24 sec on 06/20/23 at 7:29 am, available EGMs c/w AFL with 1:1 conduction and occasional non-conducted flutter wave, V rates 171-179 bpm; routed to clinic for review of increased V rates.  Follow up as scheduled. [facility], [facility] .  Unable to leave message on any number listed. All numbers are out of service. Mychart message sent again. Another letter sent.

## 2023-06-22 NOTE — Telephone Encounter (Signed)
Patient has already had visit.

## 2023-06-23 DIAGNOSIS — R32 Unspecified urinary incontinence: Secondary | ICD-10-CM | POA: Diagnosis not present

## 2023-07-06 ENCOUNTER — Encounter: Payer: Self-pay | Admitting: Internal Medicine

## 2023-07-08 ENCOUNTER — Ambulatory Visit: Payer: Self-pay | Admitting: Nurse Practitioner

## 2023-07-13 ENCOUNTER — Ambulatory Visit: Payer: Self-pay | Admitting: Nurse Practitioner

## 2023-07-13 ENCOUNTER — Ambulatory Visit: Admitting: Family Medicine

## 2023-07-13 NOTE — Telephone Encounter (Signed)
 Advised of [facility] location and hours of operation today and tomorrow.   Verbalized understanding.

## 2023-07-13 NOTE — Telephone Encounter (Signed)
  Chief Complaint: Medication refills, rectal bleeding.  Symptoms: edema, foot pain, rectal bleed Frequency: ongoing Pertinent Negatives: Patient denies chest pain, dizziness Disposition: [] ED /[] Urgent Care (no appt availability in office) / [x] Appointment(In office/virtual)/ []  [place] [facility]/ [] Home Care/ [] Refused Recommended Disposition /[]  Mobile Bus/ []  Follow-up with PCP Additional Notes:  ER three weeks ago for shortness of breath. Noted to have dehydration. Awaiting script for Miralax and routine medications.  Needs all medications refills, has not taken torsemide or any meds in 3-4 days. Calling today for medication refill, leg swelling and pain, new rectal bleeding. Has noted increased leg swelling with foot pain, mild shortness of breath. Reports he is awaiting Miralax for constipation, BM every other day, 3 days ago noted bright red blood in the toilet water and each time he has had a BM since, reports it is a small amount. Mild diffuse abdominal pain. Afebrile. No other symptoms. No PCP availability, schedule with another provider at another location, address provided.   Copied from CRM [id number]. Topic: Clinical - Red Word Triage >> Jul 13, 2023 11:15 AM Emylou G wrote: [facility] that prompted transfer to Nurse Triage: unable to walk.. retaining water.. feet hurt when he walks. Was discharged 2 weeks from the er he thought he was going to get meds 3 weeks ago and still hasnt rcvd blood in his stool. Reason for Disposition  MODERATE rectal bleeding (small blood clots, passing blood without stool, or toilet water turns red)  Answer Assessment - Initial Assessment Questions 1. APPEARANCE of BLOOD: "What color is it?" "Is it passed separately, on the surface of the stool, or mixed in with the stool?"      Bright Red 2. AMOUNT: "How much blood was passed?"      In water not just on paper, small amount 3. FREQUENCY: "How many times has blood been passed with the  stools?"      One every other day 4. ONSET: "When was the blood first seen in the stools?" (Days or weeks)      3 days ago 5. CONSTIPATION: "Do you have constipation?" If Yes, ask: "How bad is it?"     Yes, awaiting Miralax. BM Going everyother day 6. RECURRENT SYMPTOMS: "Have you had blood in your stools before?" If Yes, ask: "When was the last time?" and "What happened that time?"      When he was younger, no diagnosis, cleared on its own.  8. BLOOD THINNERS: "Do you take any blood thinners?" (e.g., Coumadin/warfarin, Pradaxa/dabigatran, aspirin)     No 9. OTHER SYMPTOMS: "Do you have any other symptoms?"  (e.g., abdomen pain, vomiting, dizziness, fever)     Foot pain, nausea, retaining water.  Protocols used: Rectal Bleeding-A-AH

## 2023-07-16 ENCOUNTER — Other Ambulatory Visit: Payer: Self-pay

## 2023-07-16 ENCOUNTER — Other Ambulatory Visit (HOSPITAL_COMMUNITY): Payer: Self-pay

## 2023-07-17 ENCOUNTER — Other Ambulatory Visit: Payer: Self-pay

## 2023-07-22 NOTE — Addendum Note (Signed)
 Addended by: Elease Etienne A on: 07/22/2023 04:57 PM   Modules accepted: Orders

## 2023-07-22 NOTE — Progress Notes (Signed)
 Remote ICD transmission.

## 2023-07-23 ENCOUNTER — Other Ambulatory Visit: Payer: Self-pay | Admitting: Critical Care Medicine

## 2023-07-23 DIAGNOSIS — I5022 Chronic systolic (congestive) heart failure: Secondary | ICD-10-CM

## 2023-07-23 DIAGNOSIS — M1A09X0 Idiopathic chronic gout, multiple sites, without tophus (tophi): Secondary | ICD-10-CM

## 2023-07-23 DIAGNOSIS — E785 Hyperlipidemia, unspecified: Secondary | ICD-10-CM

## 2023-07-23 DIAGNOSIS — I4892 Unspecified atrial flutter: Secondary | ICD-10-CM

## 2023-07-23 DIAGNOSIS — I10 Essential (primary) hypertension: Secondary | ICD-10-CM

## 2023-07-28 ENCOUNTER — Telehealth: Payer: Self-pay | Admitting: Internal Medicine

## 2023-07-28 ENCOUNTER — Telehealth: Payer: Self-pay

## 2023-07-28 DIAGNOSIS — I5022 Chronic systolic (congestive) heart failure: Secondary | ICD-10-CM

## 2023-07-28 DIAGNOSIS — I10 Essential (primary) hypertension: Secondary | ICD-10-CM

## 2023-07-28 DIAGNOSIS — I4892 Unspecified atrial flutter: Secondary | ICD-10-CM

## 2023-07-28 MED ORDER — TORSEMIDE 20 MG PO TABS: 20.0000 mg | ORAL_TABLET | Freq: Every day | ORAL | 0 refills | Status: DC

## 2023-07-28 MED ORDER — POTASSIUM CHLORIDE CRYS ER 20 MEQ PO TBCR: 20.0000 meq | EXTENDED_RELEASE_TABLET | Freq: Every day | ORAL | 0 refills | Status: DC

## 2023-07-28 MED ORDER — METOPROLOL SUCCINATE ER 50 MG PO TB24: 50.0000 mg | ORAL_TABLET | Freq: Every day | ORAL | 0 refills | Status: DC

## 2023-07-28 MED ORDER — SPIRONOLACTONE 25 MG PO TABS: 25.0000 mg | ORAL_TABLET | Freq: Every day | ORAL | 0 refills | Status: DC

## 2023-07-28 MED ORDER — AMIODARONE HCL 100 MG PO TABS: 100.0000 mg | ORAL_TABLET | Freq: Every day | ORAL | 0 refills | Status: DC

## 2023-07-28 MED ORDER — APIXABAN 5 MG PO TABS: 5.0000 mg | ORAL_TABLET | Freq: Two times a day (BID) | ORAL | 0 refills | Status: DC

## 2023-07-28 NOTE — Telephone Encounter (Signed)
 07/28/2023 Pt is to be picked up at [address] at 2:50 for appt with Brandi.  Pt will need to sign wavier at the office and will need to call [employer] at [phone] for return ride.  Estimated cost round trip is $46.00    Cody Guzman DOB: [birth year] MRN: [id number]   RIDER WAIVER AND RELEASE OF LIABILITY  For the purposes of helping with transportation needs, [facility] partners with outside transportation providers (taxi companies, [employer], Catering manager.) to give [name] patients or other approved people the choice of on-demand rides [name]") to our buildings for non-emergency visits.  By using [employer], I, the person signing this document, on behalf of myself and/or any legal minors (in my care using the [employer]), agree:  Science writer given to me are supplied by independent, outside transportation providers who do not work for, or have any affiliation with, [name] is not a transportation company. [name] has no control over the quality or safety of the rides I get using [employer]. [name] has no control over whether any outside ride will happen on time or not. [name] gives no guarantee on the reliability, quality, safety, or availability on any rides, or that no mistakes will happen. I know and accept that traveling by vehicle (car, truck, SVU, Zenaida Niece, bus, taxi, etc.) has risks of serious injuries such as disability, being paralyzed, and death. I know and agree the risk of using [employer] is mine alone, and not [name]. Transport Services are provided "as is" and as are available. The transportation providers are in charge for all inspections and care of the vehicles used to provide these rides. I agree not to take legal action against [name], its agents, employees, officers, directors, representatives, insurers, attorneys, assigns, successors, subsidiaries, and affiliates at any  time for any reasons related directly or indirectly to using [employer]. I also agree not to take legal action against [name] or its affiliates for any injury, death, or damage to property caused by or related to using [employer]. I have read this Waiver and Release of Liability, and I understand the terms used in it and their legal meaning. This Waiver is freely and voluntarily given with the understanding that my right (or any legal minors) to legal action against [name] relating to [employer] is knowingly given up to use these services.   I attest that I read the Ride Waiver and Release of Liability to [name], gave [name] the opportunity to ask questions and answered the questions asked (if any). I affirm that [name] then provided consent for assistance with transportation.     [name]

## 2023-07-28 NOTE — Telephone Encounter (Signed)
 I called and spoke with the patient regarding his remote alert transmission that was received today.   The patient was made aware that we need to see him in the office to follow up on his atrial fibrillation.   The patient voices understanding and is agreeable.  I have also confirmed with the patient that he has been out of all of his medications since ~ 06/19/23.  He states he is waiting on these from his mail order pharmacy and called them to verify what is causing the delay, per the patient's report, the pharmacy is "looking in to it."  I advised most of his medications were sent in to [facility] in January 2025 by his PCP. The patient states this is the correct pharmacy.  I confirmed the patient's cardiac medications with him and advised I will be glad to send these in to his local pharmacy for him to restart.  The patient is requesting these be sent to [facility] on [facility].   The patient is aware we are looking for an urgent work in appointment for him and will confirm with EP scheduling and call him back today. He voices understanding and is agreeable.

## 2023-07-28 NOTE — Progress Notes (Signed)
 This encounter was created in error - please disregard.

## 2023-07-28 NOTE — Telephone Encounter (Signed)
 Scheduling has reached out to the patient and scheduled him to see Canary Brim, NP tomorrow - 07/29/23 at 3:35 pm.

## 2023-07-28 NOTE — Telephone Encounter (Addendum)
 Alert received from [employer]:  Alert remote transmission:  VT/VF episodes, BiV pacing < limit 13 VT-1 classified events, longest duration 38sec, HR's 171-176, EGM's c/w atrial driven 1:1, 26 NSVT, available EGM's showing the same.  Increase in HR's per trends.  All events from 3/24 BiV pacing trend ~60%, HF diagnostics currently abnormal Presenting undersensed AF, Hx of PAF, Eliquis per PA report  Will contact Pt.

## 2023-07-29 ENCOUNTER — Ambulatory Visit: Attending: Pulmonary Disease | Admitting: Pulmonary Disease

## 2023-07-29 DIAGNOSIS — I5022 Chronic systolic (congestive) heart failure: Secondary | ICD-10-CM

## 2023-07-29 DIAGNOSIS — I5082 Biventricular heart failure: Secondary | ICD-10-CM

## 2023-07-29 DIAGNOSIS — I480 Paroxysmal atrial fibrillation: Secondary | ICD-10-CM

## 2023-07-30 ENCOUNTER — Encounter: Payer: Self-pay | Admitting: Pulmonary Disease

## 2023-07-30 ENCOUNTER — Telehealth: Payer: Self-pay

## 2023-07-30 NOTE — Telephone Encounter (Signed)
 Patient was called and he states that he has already completed a form in the office and he states that his lawyers office has received information and he was not able to access the information. Patient was informed to reach out to medical records he states that he has the number.     Copied from CRM [id number]. Topic: Medical Record Request - Payor/Billing Request >> Jul 30, 2023  8:40 AM [name] D wrote: Patient needs his records from 2022 for legal purposes. Tried to give the patient the number to medical records he didn't want it. Requesting to speak with his doctor's office

## 2023-08-06 ENCOUNTER — Ambulatory Visit: Payer: Self-pay

## 2023-08-06 NOTE — Telephone Encounter (Signed)
 Chief Complaint: SOB Symptoms: SOB, weak urinary stream, groin swelling, ICD malfunction Frequency: 5 wks Pertinent Negatives: Patient denies CP, N/V, syncope, palpitations Disposition: [x] ED /[] Urgent Care (no appt availability in office) / [] Appointment(In office/virtual)/ []  [facility]/ [] Home Care/ [] Refused Recommended Disposition /[] [facility]/ []  Follow-up with PCP Additional Notes: Pt reports SOB on exertion after walking 10 steps and states he is retaining fluid. Pt has a hx of CHF and cardiomyopathy with an ICD. Pt states he has worsened since his last doctor Dr. Delford Field retired. States he requested a refill of torsemide but it was never filled and he has not taken that medication in 5 wks. SOB on exertion now for 5 wks with groin swelling. States he has pain in his scrotum and weak urinary streams. Pt states he can't even see urine when he urinates. Pt also states his ICD is not functioning. RN advised pt he needs to go to the ED. Initially pt said he had to meet with his college advisor and go to a class, RN advised pt this is more important and he needs to go to the ED now. Pt said he will email his professor and go to the ED. RN advised pt if he develops CP, SOB at rest, palpitations or any worsening he needs to call 911. Pt verbalized understanding.     Copied from CRM [id number]. Topic: Clinical - Red Word Triage >> Aug 06, 2023 11:40 AM Cody Guzman wrote: Red Word that prompted transfer to Nurse Triage: The patient called in stating he has been in pain, had some shortness of breath and been retaining a lot of water. He has a defibrillator but he says he needs a new one or adjustments. He is really in a bad way. I will transfer him to [facility] Reason for Disposition  Patient sounds very sick or weak to the triager  Answer Assessment - Initial Assessment Questions 1. RESPIRATORY STATUS: "Describe your breathing?" (e.g., wheezing, shortness of breath, unable to speak,  severe coughing)       SOB on exertion after walking 10 steps. States after Dr. Delford Field retired breathing has gotten worse and now he is using his inhaler more 2. ONSET: "When did this breathing problem begin?"      5 wks ago when he stopped torsemide 3. PATTERN "Does the difficult breathing come and go, or has it been constant since it started?"      On exertion 4. SEVERITY: "How bad is your breathing?" (e.g., mild, moderate, severe)    - MILD: No SOB at rest, mild SOB with walking, speaks normally in sentences, can lie down, no retractions, pulse < 100.    - MODERATE: SOB at rest, SOB with minimal exertion and prefers to sit, cannot lie down flat, speaks in phrases, mild retractions, audible wheezing, pulse 100-120.    - SEVERE: Very SOB at rest, speaks in single words, struggling to breathe, sitting hunched forward, retractions, pulse > 120      No SOB at rest, with 10 steps he becomes SOB 5. RECURRENT SYMPTOM: "Have you had difficulty breathing before?" If Yes, ask: "When was the last time?" and "What happened that time?"      Not this bad  6. CARDIAC HISTORY: "Do you have any history of heart disease?" (e.g., heart attack, angina, bypass surgery, angioplasty)      ICD, CHF, cardiomyopathy, a flutter 7. LUNG HISTORY: "Do you have any history of lung disease?"  (e.g., pulmonary embolus, asthma,  emphysema)     Pulmonary HTN, CHF 8. CAUSE: "What do you think is causing the breathing problem?"      States he is out of torsemide and is retaining fluid, no torsemide in 5 wks 9. OTHER SYMPTOMS: "Do you have any other symptoms? (e.g., dizziness, runny nose, cough, chest pain, fever)     Endorses that he is retaining fluid and states that is why he is becoming more SOB. States needs to see a urologist and has "weak streams." Endorses ejaculatory problem. States his swelling in his groin. Pain to his scrotum. States he can't feel urine coming out. Denies CP. Denies N/V. States he has dizziness when he  walks too far. Endorses chronic pain after a stroke in 2022 10. O2 SATURATION MONITOR:  "Do you use an oxygen saturation monitor (pulse oximeter) at home?" If Yes, ask: "What is your reading (oxygen level) today?" "What is your usual oxygen saturation reading?" (e.g., 95%)       Last time he did it was 3 wks ago, can't remember what it was.  Has an ICD. States it is not working and needs to get back on track. Ran out of torsemide - requested a refill, never got it. Used to take furosemide. States he switched from [place] to [place].  Protocols used: Breathing Difficulty-A-AH

## 2023-08-14 ENCOUNTER — Telehealth: Payer: Self-pay | Admitting: *Deleted

## 2023-08-14 NOTE — Telephone Encounter (Signed)
 Spoke with pt and advised due device alerts it is important for pt to be seen in clinic to be further evaluated.  Pt verbalizes understanding and appointment scheduled for Monday 08/17/2023 at 130pm.

## 2023-08-14 NOTE — Telephone Encounter (Signed)
 Alert received from [employer]   Repeat alert remote transmission:  VT/VF episodes 44 VT-1 classified events, longest duration 40sec,  EGM's c/w atrial driven 1:1, HR's 170's , 30 NSVT BiV pacing trend ~20% Presenting undersensed AF, Hx of PAF, Eliquis per PA report Previous  documents and events have been routed, clinic unsuccessful in contacting pt. Re-route for awareness of ongoing numerous events  Patient overdue for follow up.  No showed for 07/29/23 appointment with Merry Proud, NP Multiple attempts at contacting patient without success. Multiple letters sent to patient with no response. Will send to scheduler to try and reschedule.  Will send to MD for awareness.

## 2023-08-16 NOTE — Progress Notes (Unsigned)
 Electrophysiology Office Note:   Date:  08/17/2023  ID:  Cody Guzman, DOB 11/16/1965, MRN [id number]  Primary Cardiologist: Euell Herrlich, MD Primary Heart Failure: None Electrophysiologist: Richardo Chandler, MD      History of Present Illness:   Cody Guzman is a 58 y.o. male with h/o HFrEF / NICM s/p CRT-D, AF/AFL, RBBB/LBBB, THC abuse, CKD IIIb  seen today for acute visit due to device alerts.    Phone notes from 08/06/23 show that the patient was advised to go to the ER for reports of shortness of breath and fluid retention. He has been out of his torsemide for 5+ weeks.   Device Clinic received an alert on 08/14/23 for VT/VF episodes, longest duration 6 min 40 sec, EGM c/w 1:1 atrial driven rhythm, rate 170's, 30 NSVT, BiV pacing trend ~20%.  Presenting undersensed AF.  Multiple attempts at contacting the patient & he was a no show for appt 07/29/23.  He was reached on 08/14/23 and encouraged to keep visit on 08/17/23.   Patient reports he has run out of his medications.  He follows at the [facility]. He gets his medications there and fills them downstairs at the pharmacy. He states he has been out of his medications for at least 6 weeks except his eliquis and colchicine. He does report he has missed doses of eliquis - mostly the evening doses.   He denies chest pain, palpitations, dyspnea, PND, orthopnea, nausea, vomiting, dizziness, syncope, edema, weight gain, or early satiety.   Review of systems complete and found to be negative unless listed in HPI.   EP Information / Studies Reviewed:    EKG is ordered today. Personal review as below.  EKG Interpretation Date/Time:  Monday August 17 2023 13:24:54 EDT Ventricular Rate:  85 PR Interval:  132 QRS Duration:  150 QT Interval:  446 QTC Calculation: 530 R Axis:   -70  Text Interpretation: Atrial Flutter / Ventricular Sensing Left axis deviation Right bundle branch block Confirmed by Creighton Doffing ([id number]) on  08/17/2023 6:32:58 PM   ICD Interrogation-  reviewed in detail today,  See PACEART report.  Device History: Abbott BiV ICD implanted 06/05/22 for ICM History of appropriate therapy: No History of AAD therapy: Yes; currently on amiodarone    Studies:  ECHO 01/2022 > LVEF 20-25%, RV dysfunction     Arrhythmia / AAD AF / AFL   Risk Assessment/Calculations:    CHA2DS2-VASc Score = 2   This indicates a 2.2% annual risk of stroke. The patient's score is based upon: CHF History: 1 HTN History: 1 Diabetes History: 0 Stroke History: 0 Vascular Disease History: 0 Age Score: 0 Gender Score: 0             Physical Exam:   VS:  BP 100/64   Pulse 85   Ht 5\' 8"  (1.727 m)   Wt 193 lb (87.5 kg)   SpO2 90%   BMI 29.35 kg/m    Wt Readings from Last 3 Encounters:  08/17/23 192 lb (87.1 kg)  08/17/23 193 lb (87.5 kg)  06/16/23 191 lb (86.6 kg)     GEN: Well nourished, well developed in no acute distress NECK: No JVD; No carotid bruits CARDIAC: regular rate and rhythm, no murmurs, rubs, gallops RESPIRATORY:  Clear to auscultation without rales, wheezing or rhonchi  ABDOMEN: Soft, non-tender, non-distended EXTREMITIES:  No edema; No deformity   ASSESSMENT AND PLAN:    Chronic systolic dysfunction s/p Abbott CRT-D  NSVT  -euvolemic on  exam / and by thoracic impedence  -81% BiV pacing -Stable on an appropriate medical regimen -Normal ICD function -See Pace Art report -long discussion with patient about importance of medications and adherence  -A waves small with atrial undersensing on device > adjusted sensitivity to 0.2 mV (measures A waves 0.3 mV), changed mode switch rate to 160 bpm -multiple episodes of binned as "VT" which are SVT 1:1 then blocks, AF vs AFL  Paroxysmal Atrial Fibrillation  AFL  CHA2DS2-VASc 2 -OAC for stroke prophylaxis  -encouraged compliance with anticoagulation  -he is to get new scripts from his PCP today / has an appt after this visit  -will  follow up in one month, if consistent with meds and OAC / remains in AFL will consider TEE DCCV given med non-compliance   Secondary Hypercoagulable State  -continue Eliquis 5mg  BID, dose reviewed and appropriate by age / wt    Disposition:   Follow up with EP APP in 4 weeks   Signed, Creighton Doffing, NP-C, AGACNP-BC [facility] - Electrophysiology  08/17/2023, 6:34 PM

## 2023-08-17 ENCOUNTER — Ambulatory Visit: Attending: Pulmonary Disease | Admitting: Pulmonary Disease

## 2023-08-17 ENCOUNTER — Other Ambulatory Visit (HOSPITAL_COMMUNITY): Payer: Self-pay

## 2023-08-17 ENCOUNTER — Ambulatory Visit: Payer: Self-pay | Attending: Nurse Practitioner | Admitting: Nurse Practitioner

## 2023-08-17 ENCOUNTER — Encounter: Payer: Self-pay | Admitting: Pulmonary Disease

## 2023-08-17 ENCOUNTER — Encounter: Payer: Self-pay | Admitting: Nurse Practitioner

## 2023-08-17 ENCOUNTER — Other Ambulatory Visit: Payer: Self-pay

## 2023-08-17 VITALS — BP 100/64 | HR 85 | Ht 68.0 in | Wt 193.0 lb

## 2023-08-17 VITALS — BP 111/76 | HR 83 | Ht 68.0 in | Wt 192.0 lb

## 2023-08-17 DIAGNOSIS — M1A09X0 Idiopathic chronic gout, multiple sites, without tophus (tophi): Secondary | ICD-10-CM

## 2023-08-17 DIAGNOSIS — F4310 Post-traumatic stress disorder, unspecified: Secondary | ICD-10-CM

## 2023-08-17 DIAGNOSIS — N3941 Urge incontinence: Secondary | ICD-10-CM

## 2023-08-17 DIAGNOSIS — F17210 Nicotine dependence, cigarettes, uncomplicated: Secondary | ICD-10-CM | POA: Diagnosis not present

## 2023-08-17 DIAGNOSIS — E876 Hypokalemia: Secondary | ICD-10-CM

## 2023-08-17 DIAGNOSIS — I4510 Unspecified right bundle-branch block: Secondary | ICD-10-CM

## 2023-08-17 DIAGNOSIS — I4892 Unspecified atrial flutter: Secondary | ICD-10-CM

## 2023-08-17 DIAGNOSIS — I5022 Chronic systolic (congestive) heart failure: Secondary | ICD-10-CM

## 2023-08-17 DIAGNOSIS — I10 Essential (primary) hypertension: Secondary | ICD-10-CM

## 2023-08-17 DIAGNOSIS — I5082 Biventricular heart failure: Secondary | ICD-10-CM

## 2023-08-17 DIAGNOSIS — D6869 Other thrombophilia: Secondary | ICD-10-CM

## 2023-08-17 DIAGNOSIS — I480 Paroxysmal atrial fibrillation: Secondary | ICD-10-CM | POA: Diagnosis not present

## 2023-08-17 DIAGNOSIS — E785 Hyperlipidemia, unspecified: Secondary | ICD-10-CM

## 2023-08-17 LAB — CUP PACEART INCLINIC DEVICE CHECK
Date Time Interrogation Session: 20250414183918
Implantable Lead Connection Status: 753985
Implantable Lead Connection Status: 753985
Implantable Lead Connection Status: 753985
Implantable Lead Implant Date: 20240201
Implantable Lead Implant Date: 20240201
Implantable Lead Implant Date: 20240201
Implantable Lead Location: 753858
Implantable Lead Location: 753859
Implantable Lead Location: 753860
Implantable Lead Model: 3830
Implantable Lead Model: 7122
Implantable Pulse Generator Implant Date: 20240201
Pulse Gen Serial Number: 5559393

## 2023-08-17 MED ORDER — APIXABAN 5 MG PO TABS
5.0000 mg | ORAL_TABLET | Freq: Two times a day (BID) | ORAL | 1 refills | Status: DC
  Filled 2023-08-17 (×3): qty 180, 90d supply, fill #0

## 2023-08-17 MED ORDER — IRBESARTAN 75 MG PO TABS
37.5000 mg | ORAL_TABLET | Freq: Every day | ORAL | 1 refills | Status: DC
  Filled 2023-08-17 (×3): qty 45, 90d supply, fill #0

## 2023-08-17 MED ORDER — POTASSIUM CHLORIDE CRYS ER 20 MEQ PO TBCR
20.0000 meq | EXTENDED_RELEASE_TABLET | Freq: Every day | ORAL | 1 refills | Status: DC
  Filled 2023-08-17 (×3): qty 90, 90d supply, fill #0

## 2023-08-17 MED ORDER — EMPAGLIFLOZIN 10 MG PO TABS
10.0000 mg | ORAL_TABLET | Freq: Every day | ORAL | 1 refills | Status: DC
  Filled 2023-08-17 (×3): qty 90, 90d supply, fill #0

## 2023-08-17 MED ORDER — ATORVASTATIN CALCIUM 80 MG PO TABS
80.0000 mg | ORAL_TABLET | Freq: Every day | ORAL | 1 refills | Status: DC
  Filled 2023-08-17 (×3): qty 90, 90d supply, fill #0

## 2023-08-17 MED ORDER — ALBUTEROL SULFATE (2.5 MG/3ML) 0.083% IN NEBU
2.5000 mg | INHALATION_SOLUTION | RESPIRATORY_TRACT | 10 refills | Status: DC | PRN
  Filled 2023-08-17 (×3): qty 150, 9d supply, fill #0

## 2023-08-17 MED ORDER — SPIRONOLACTONE 25 MG PO TABS
25.0000 mg | ORAL_TABLET | Freq: Every day | ORAL | 1 refills | Status: DC
  Filled 2023-08-17 (×3): qty 90, 90d supply, fill #0

## 2023-08-17 MED ORDER — COLCHICINE 0.6 MG PO TABS
0.6000 mg | ORAL_TABLET | ORAL | 1 refills | Status: DC
  Filled 2023-08-17 – 2023-09-15 (×2): qty 45, 90d supply, fill #0

## 2023-08-17 MED ORDER — ALLOPURINOL 100 MG PO TABS
100.0000 mg | ORAL_TABLET | Freq: Every day | ORAL | 1 refills | Status: DC
  Filled 2023-08-17 (×3): qty 90, 90d supply, fill #0

## 2023-08-17 MED ORDER — TORSEMIDE 20 MG PO TABS
20.0000 mg | ORAL_TABLET | Freq: Every day | ORAL | 0 refills | Status: DC
  Filled 2023-08-17: qty 90, 90d supply, fill #0

## 2023-08-17 MED ORDER — TOPIRAMATE 25 MG PO CPSP
25.0000 mg | ORAL_CAPSULE | Freq: Every day | ORAL | 1 refills | Status: DC
  Filled 2023-08-17 (×3): qty 90, 45d supply, fill #0

## 2023-08-17 MED ORDER — QUETIAPINE FUMARATE ER 300 MG PO TB24
300.0000 mg | ORAL_TABLET | Freq: Every day | ORAL | 1 refills | Status: DC
  Filled 2023-08-17 (×3): qty 90, 90d supply, fill #0

## 2023-08-17 MED ORDER — AMIODARONE HCL 200 MG PO TABS
100.0000 mg | ORAL_TABLET | Freq: Every day | ORAL | 0 refills | Status: DC
  Filled 2023-08-17 (×3): qty 45, 90d supply, fill #0

## 2023-08-17 NOTE — Patient Instructions (Signed)
 Medication Instructions:  Your physician recommends that you continue on your current medications as directed. Please refer to the Current Medication list given to you today.  *If you need a refill on your cardiac medications before your next appointment, please call your pharmacy*  Lab Work: None ordered If you have labs (blood work) drawn today and your tests are completely normal, you will receive your results only by: MyChart Message (if you have MyChart) OR A paper copy in the mail If you have any lab test that is abnormal or we need to change your treatment, we will call you to review the results.  Follow-Up: At [facility], you and your health needs are our priority.  As part of our continuing mission to provide you with exceptional heart care, our providers are all part of one team.  This team includes your primary Cardiologist (physician) and Advanced Practice Providers or APPs (Physician Assistants and Nurse Practitioners) who all work together to provide you with the care you need, when you need it.  Your next appointment:   1 month(s)  Provider:   Canary Brim, NP       1st Floor: - Lobby - Registration  - Pharmacy  - Lab - Cafe  2nd Floor: - PV Lab - Diagnostic Testing (echo, CT, nuclear med)  3rd Floor: - Vacant  4th Floor: - TCTS (cardiothoracic surgery) - AFib Clinic - Structural Heart Clinic - Vascular Surgery  - Vascular Ultrasound  5th Floor: - HeartCare Cardiology (general and EP) - Clinical Pharmacy for coumadin, hypertension, lipid, weight-loss medications, and med management appointments    Valet parking services will be available as well.

## 2023-08-17 NOTE — Telephone Encounter (Signed)
 thx
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
## 2023-08-18 ENCOUNTER — Encounter: Payer: Self-pay | Admitting: Nurse Practitioner

## 2023-08-18 ENCOUNTER — Other Ambulatory Visit: Payer: Self-pay

## 2023-08-18 ENCOUNTER — Other Ambulatory Visit (HOSPITAL_COMMUNITY): Payer: Self-pay

## 2023-08-18 ENCOUNTER — Telehealth: Admitting: Physician Assistant

## 2023-08-18 ENCOUNTER — Ambulatory Visit: Payer: Self-pay

## 2023-08-18 LAB — PSA: Prostate Specific Ag, Serum: 1.7 ng/mL (ref 0.0–4.0)

## 2023-08-18 LAB — POTASSIUM: Potassium: 4 mmol/L (ref 3.5–5.2)

## 2023-08-18 NOTE — Telephone Encounter (Signed)
 Chief Complaint: Hands "locking up", pain Symptoms: 8-9/10 pain left hand, 6/10 pain right hand, pain shoots up arms Frequency: Onset 1.5 months, gotten worse Pertinent Negatives: Patient denies other symptoms Disposition: [] ED /[] Urgent Care (no appt availability in office) / [] Appointment(In office/virtual)/ [x]  [place] [facility]/ [] Home Care/ [] Refused Recommended Disposition /[] [place] [facility]/ []  Follow-up with PCP Additional Notes: Patient says for the past 1.5 months his hands have been "locking up" in a closed fist position w/shooting pains up his arms. He says he has to pry his hands open so they don't get stuck like that. He says in 2022 when he had a stroke, his hands were like that and finally got straight again. He denies numbness, tingling to the hands/arms. Advised no availability with PCP until May, no availability at [facility] for a couple weeks. I offered to scheduled outside of the practice, he denies. I offered virtual UC, he agreed, scheduled for today.   Copied from CRM [id number]. Topic: Clinical - Red Word Triage >> Aug 18, 2023 12:47 PM Baldomero Bone wrote: Red Word that prompted transfer to Nurse Triage: Patient had a stoke in 2022. Left hand locked. right hand is locking and stopping. Shooting pain when locked up. Callback number is [phone] Reason for Disposition  [1] MODERATE pain (e.g., interferes with normal activities) AND [2] present > 3 days  Answer Assessment - Initial Assessment Questions 1. ONSET: "When did the pain start?"     1.5 months shoot through and not last 2. LOCATION: "Where is the pain located?"     Left hand 3. PAIN: "How bad is the pain?" (Scale 1-10; or mild, moderate, severe)   - MILD (1-3): doesn't interfere with normal activities   - MODERATE (4-7): interferes with normal activities (e.g., work or school) or awakens from sleep   - SEVERE (8-10): excruciating pain, unable to use hand at all     8-9 left hand, 6 right hand 4. WORK OR  EXERCISE: "Has there been any recent work or exercise that involved this part (i.e., hand or wrist) of the body?"     No 5. CAUSE: "What do you think is causing the pain?"      No idea 6. OTHER SYMPTOMS: "Do you have any other symptoms?" (e.g., neck pain, swelling, rash, numbness, fever)     Hands get "locked up" in a fist and have to force it open  Protocols used: Hand and Wrist Pain-A-AH

## 2023-08-18 NOTE — Progress Notes (Signed)
 The patient no-showed for appointment despite this provider sending direct link with no response and waiting for at least 10 minutes from appointment time for patient to join. They will be marked as a NS for this appointment/time.   Piedad Climes, PA-C

## 2023-08-18 NOTE — Telephone Encounter (Signed)
 Spoke with patient . Patient was a not show for VV. Patient voiced that he has been waiting for someone to call him back as we continues to speak , patient began to yelling out in pain. Advised patient to call 911, patient declined voiced that he was going to have someone take him to the ED now.

## 2023-08-18 NOTE — Telephone Encounter (Signed)
Will forward to nurse 

## 2023-09-07 ENCOUNTER — Encounter: Payer: Self-pay | Admitting: Nurse Practitioner

## 2023-09-15 ENCOUNTER — Other Ambulatory Visit (HOSPITAL_COMMUNITY): Payer: Self-pay

## 2023-09-15 ENCOUNTER — Other Ambulatory Visit: Payer: Self-pay

## 2023-09-15 ENCOUNTER — Other Ambulatory Visit: Payer: Self-pay | Admitting: Internal Medicine

## 2023-09-15 DIAGNOSIS — I5022 Chronic systolic (congestive) heart failure: Secondary | ICD-10-CM

## 2023-09-15 MED ORDER — TORSEMIDE 20 MG PO TABS
20.0000 mg | ORAL_TABLET | Freq: Every day | ORAL | 0 refills | Status: DC
Start: 2023-09-15 — End: 2024-01-21
  Filled 2023-09-15: qty 90, 90d supply, fill #0

## 2023-09-15 NOTE — Progress Notes (Deleted)
 Electrophysiology Office Note:   Date:  09/15/2023  ID:  Cody Guzman, DOB 1965/06/10, MRN [id number]  Primary Cardiologist: Euell Herrlich, MD Primary Heart Failure: None Electrophysiologist: Richardo Chandler, MD   {Click to update primary MD,subspecialty MD or APP then REFRESH:1}    History of Present Illness:   Cody Guzman is a 58 y.o. male with h/o  HFrEF / NICM s/p CRT-D, AF/AFL, RBBB/LBBB, THC abuse, CKD IIIb seen today for routine electrophysiology followup.   Phone notes from 08/06/23 show that the patient was advised to go to the ER for reports of shortness of breath and fluid retention. He has been out of his torsemide  for 5+ weeks.    Device Clinic received an alert on 08/14/23 for VT/VF episodes, longest duration 6 min 40 sec, EGM c/w 1:1 atrial driven rhythm, rate 170's, 30 NSVT, BiV pacing trend ~20%.  Presenting undersensed AF.  Multiple attempts at contacting the patient & he was a no show for appt 07/29/23.  He was reached on 08/14/23 and encouraged to keep visit on 08/17/23.   He was seen in EP Clinic 08/17/23 & reported he had been out of his medications for at least 6 weeks except for eliquis  and colchicine .  He reported he missed doses of eliquis  in the evenings.   Since last being seen in our clinic the patient reports doing ***.  he denies chest pain, palpitations, dyspnea, PND, orthopnea, nausea, vomiting, dizziness, syncope, edema, weight gain, or early satiety.   Review of systems complete and found to be negative unless listed in HPI.   EP Information / Studies Reviewed:    {EKGtoday:28818}      ICD Interrogation-  reviewed in detail today,  See PACEART report.  Device History: Abbott BiV ICD implanted 06/05/22 for ICM History of appropriate therapy: No History of AAD therapy: Yes; currently on amiodarone     Studies:  ECHO 01/2022 > LVEF 20-25%, RV dysfunction     Arrhythmia / AAD AF / AFL  Risk Assessment/Calculations:    CHA2DS2-VASc Score = 2   {Confirm score is correct.  If not, click here to update score.  REFRESH note.  :1} This indicates a 2.2% annual risk of stroke. The patient's score is based upon: CHF History: 1 HTN History: 1 Diabetes History: 0 Stroke History: 0 Vascular Disease History: 0 Age Score: 0 Gender Score: 0   {This patient has a significant risk of stroke if diagnosed with atrial fibrillation.  Please consider VKA or DOAC agent for anticoagulation if the bleeding risk is acceptable.   You can also use the SmartPhrase .[email or web address] for documentation.   :[id number]} No BP recorded.  {Refresh Note OR Click here to enter BP  :1}***        Physical Exam:   VS:  There were no vitals taken for this visit.   Wt Readings from Last 3 Encounters:  08/17/23 192 lb (87.1 kg)  08/17/23 193 lb (87.5 kg)  06/16/23 191 lb (86.6 kg)     GEN: Well nourished, well developed in no acute distress NECK: No JVD; No carotid bruits CARDIAC: {EPRHYTHM:28826}, no murmurs, rubs, gallops RESPIRATORY:  Clear to auscultation without rales, wheezing or rhonchi  ABDOMEN: Soft, non-tender, non-distended EXTREMITIES:  No edema; No deformity   ASSESSMENT AND PLAN:    Chronic Systolic Dysfunction s/p Abbott CRT-D  -euvolemic on exam & by thoracic impedence -***% BiV pacing  -Stable on an appropriate medical regimen -Normal ICD function -See Pace Art report -No changes today ***   ***  Delete *** A waves small with atrial undersensing on device > adjusted sensitivity to 0.2 mV (measures A waves 0.3 mV), changed mode switch rate to 160 bpm -multiple episodes of binned as "VT" which are SVT 1:1 then blocks, AF vs AFL  Paroxysmal Atrial Fibrillation  AFL  CHA2DS2-VASc 2  -OAC for stroke prophylaxis  -***% burden on device  -consistent with meds?? > consider TEE DCCV if remains in AFL  Secondary Hypercoagulable State  -continue Eliquis  5mg  BID, dose reviewed and appropriate by age / wt  -medication non-compliance     Disposition:   Follow up with {EPPROVIDERS:28135} {EPFOLLOW WU:98119}   Signed, Creighton Doffing, NP-C, AGACNP-BC [facility] - Electrophysiology  09/15/2023, 8:39 PM

## 2023-09-16 ENCOUNTER — Ambulatory Visit (INDEPENDENT_AMBULATORY_CARE_PROVIDER_SITE_OTHER): Payer: Medicaid Other

## 2023-09-16 DIAGNOSIS — I4510 Unspecified right bundle-branch block: Secondary | ICD-10-CM | POA: Diagnosis not present

## 2023-09-16 LAB — CUP PACEART REMOTE DEVICE CHECK
Battery Remaining Longevity: 68 mo
Battery Remaining Percentage: 77 %
Battery Voltage: 3.01 V
Brady Statistic AP VP Percent: 2.3 %
Brady Statistic AP VS Percent: 1 %
Brady Statistic AS VP Percent: 92 %
Brady Statistic AS VS Percent: 5 %
Brady Statistic RA Percent Paced: 1 %
Date Time Interrogation Session: 20250514032445
HighPow Impedance: 55 Ohm
HighPow Impedance: 55 Ohm
Implantable Lead Connection Status: 753985
Implantable Lead Connection Status: 753985
Implantable Lead Connection Status: 753985
Implantable Lead Implant Date: 20240201
Implantable Lead Implant Date: 20240201
Implantable Lead Implant Date: 20240201
Implantable Lead Location: 753858
Implantable Lead Location: 753859
Implantable Lead Location: 753860
Implantable Lead Model: 3830
Implantable Lead Model: 7122
Implantable Pulse Generator Implant Date: 20240201
Lead Channel Impedance Value: 340 Ohm
Lead Channel Impedance Value: 410 Ohm
Lead Channel Impedance Value: 510 Ohm
Lead Channel Pacing Threshold Amplitude: 0.75 V
Lead Channel Pacing Threshold Amplitude: 0.75 V
Lead Channel Pacing Threshold Amplitude: 1 V
Lead Channel Pacing Threshold Pulse Width: 0.5 ms
Lead Channel Pacing Threshold Pulse Width: 0.5 ms
Lead Channel Pacing Threshold Pulse Width: 1 ms
Lead Channel Sensing Intrinsic Amplitude: 0.3 mV
Lead Channel Sensing Intrinsic Amplitude: 6.8 mV
Lead Channel Setting Pacing Amplitude: 1.5 V
Lead Channel Setting Pacing Amplitude: 2 V
Lead Channel Setting Pacing Amplitude: 2.5 V
Lead Channel Setting Pacing Pulse Width: 0.5 ms
Lead Channel Setting Pacing Pulse Width: 0.5 ms
Lead Channel Setting Sensing Sensitivity: 1 mV
Pulse Gen Serial Number: 5559393
Zone Setting Status: 755011

## 2023-09-17 ENCOUNTER — Ambulatory Visit: Attending: Pulmonary Disease | Admitting: Pulmonary Disease

## 2023-09-17 DIAGNOSIS — I5082 Biventricular heart failure: Secondary | ICD-10-CM

## 2023-09-17 DIAGNOSIS — I480 Paroxysmal atrial fibrillation: Secondary | ICD-10-CM

## 2023-09-17 DIAGNOSIS — D6869 Other thrombophilia: Secondary | ICD-10-CM

## 2023-09-17 DIAGNOSIS — Z95810 Presence of automatic (implantable) cardiac defibrillator: Secondary | ICD-10-CM

## 2023-09-17 DIAGNOSIS — I4892 Unspecified atrial flutter: Secondary | ICD-10-CM

## 2023-09-17 DIAGNOSIS — I5022 Chronic systolic (congestive) heart failure: Secondary | ICD-10-CM

## 2023-09-18 ENCOUNTER — Encounter: Payer: Self-pay | Admitting: Pulmonary Disease

## 2023-09-18 ENCOUNTER — Other Ambulatory Visit: Payer: Self-pay

## 2023-09-20 ENCOUNTER — Ambulatory Visit: Payer: Self-pay | Admitting: Cardiology

## 2023-10-06 ENCOUNTER — Telehealth: Payer: Self-pay | Admitting: Pulmonary Disease

## 2023-10-06 NOTE — Telephone Encounter (Signed)
 Patient no show'd his 5/15 appointment with Creighton Doffing, NP that was for a 1 month follow up . Call x1; attempted to contact patient via cell; voicemail box not set up, attempted to contact patient via home/work (same number); not available, attempted to contact patient via emergency contact, Reeves Canter; call could not be completed. In past efforts to reach patient, it has been very hard to reach patient. I will attempt to reach patient via MyChart messaging. However, although patient was last active on MyChart on 09/29/23, patient is not typically responsive to messages.

## 2023-10-13 NOTE — Telephone Encounter (Signed)
 Spoke w/ patient - he has been rescheduled for 7/1 with Creighton Doffing, NP.

## 2023-10-27 NOTE — Progress Notes (Signed)
 Remote ICD transmission.

## 2023-11-02 NOTE — Progress Notes (Deleted)
 Electrophysiology Office Note:   Date:  11/02/2023  ID:  Cody Guzman, DOB [birth year], MRN [id number]  Primary Cardiologist: Soyla DELENA Merck, MD Primary Heart Failure: None Electrophysiologist: Elspeth Sage, MD   {Click to update primary MD,subspecialty MD or APP then REFRESH:1}    History of Present Illness:   Cody Guzman is a 58 y.o. male with h/o HFrEF / NICM s/p CRT-D, AF / AFL, RBBB / LBBB, THC abuse, CKD IIIb seen today for routine electrophysiology followup.   Per notes, pt was advised to go to the ER on 08/06/23 for SOB and fluid retention. He had been out of his torsemide  for 5+ weeks. The Device Clinic received an alert on 08/14/23 for VT/VF episodes, longest duration 6 min 40 sec.  EGM c/w 1:1 atrial driven rhythm, rate 170's, BiV pacing trend 20%.  Presenting undersensed AF.  Multiple attempts made to contact patient > he was a no show for 07/29/23 appt.  He was seen in EP Clinic 08/17/23 and reported he had been out of his medications for at least 6 weeks except his eliquis  and colchicine .  He reported he misses does of eliquis  in the evenings.   Since last being seen in our clinic the patient reports doing ***.    He denies chest pain, palpitations, dyspnea, PND, orthopnea, nausea, vomiting, dizziness, syncope, edema, weight gain, or early satiety.   Review of systems complete and found to be negative unless listed in HPI.    EP Information / Studies Reviewed:    EKG is not ordered today. EKG from 08/17/23 reviewed which showed AFL, VS 85 bpm      ICD Interrogation-  reviewed in detail today,  See PACEART report.  Device History: Abbott BiV ICD implanted 06/05/22 for ICM History of appropriate therapy: No History of AAD therapy: Yes; currently on amiodarone    Studies:  ECHO 01/2022 > LVEF 20-25%, RV dysfunction    Arrhythmia / AAD AF / AFL     Risk Assessment/Calculations:    CHA2DS2-VASc Score = 2  {Confirm score is correct.  If not, click here to  update score.  REFRESH note.  :1} This indicates a 2.2% annual risk of stroke. The patient's score is based upon: CHF History: 1 HTN History: 1 Diabetes History: 0 Stroke History: 0 Vascular Disease History: 0 Age Score: 0 Gender Score: 0   {This patient has a significant risk of stroke if diagnosed with atrial fibrillation.  Please consider VKA or DOAC agent for anticoagulation if the bleeding risk is acceptable.   You can also use the SmartPhrase .[email or web address] for documentation.   :[id number]} No BP recorded.  {Refresh Note OR Click here to enter BP  :1}***        Physical Exam:   VS:  There were no vitals taken for this visit.   Wt Readings from Last 3 Encounters:  08/17/23 192 lb (87.1 kg)  08/17/23 193 lb (87.5 kg)  06/16/23 191 lb (86.6 kg)     GEN: Well nourished, well developed in no acute distress NECK: No JVD; No carotid bruits CARDIAC: {EPRHYTHM:28826}, no murmurs, rubs, gallops RESPIRATORY:  Clear to auscultation without rales, wheezing or rhonchi  ABDOMEN: Soft, non-tender, non-distended EXTREMITIES:  No edema; No deformity   ASSESSMENT AND PLAN:    Chronic Systolic Dysfunction s/p Abbott CRT-D  -euvolemic on exam ***  -***% BiV pacing  -Stable on an appropriate medical regimen -Normal ICD function -See Pace Art report -No changes today  ***Delete*** A waves small with  atrial undersensing on device, > adjusted sensitivity to 0.2 mV (measured A waves 0.3 mV), changed mode switch rate to 160 bpm, multiple episodes binned as VT which are SVT 1:1 then blocks, AF vs AFL  Paroxysmal Atrial Fibrillation  AFL  CHA2DS2-VASc 2 -OAC for stroke prophylaxis  -***% burden AF on device  -consider TEE DCCV if remains in AFL > ??consistent w meds  Secondary Hypercoagulable State  -continue Eliquis  5mg  BID, dose reviewed and appropriate by age / wt     Disposition:   Follow up with EP APP {EPFOLLOW LE:71826}   Signed, Daphne Barrack, NP-C, AGACNP-BC [facility] - Electrophysiology  11/02/2023, 7:57 AM

## 2023-11-03 ENCOUNTER — Ambulatory Visit: Attending: Cardiology | Admitting: Pulmonary Disease

## 2023-11-03 DIAGNOSIS — I4892 Unspecified atrial flutter: Secondary | ICD-10-CM

## 2023-11-03 DIAGNOSIS — I5082 Biventricular heart failure: Secondary | ICD-10-CM

## 2023-11-03 DIAGNOSIS — D6869 Other thrombophilia: Secondary | ICD-10-CM

## 2023-11-03 DIAGNOSIS — I5022 Chronic systolic (congestive) heart failure: Secondary | ICD-10-CM

## 2023-11-03 DIAGNOSIS — I4510 Unspecified right bundle-branch block: Secondary | ICD-10-CM

## 2023-11-03 DIAGNOSIS — Z95810 Presence of automatic (implantable) cardiac defibrillator: Secondary | ICD-10-CM

## 2023-11-03 DIAGNOSIS — I480 Paroxysmal atrial fibrillation: Secondary | ICD-10-CM

## 2023-11-04 ENCOUNTER — Encounter: Payer: Self-pay | Admitting: Pulmonary Disease

## 2023-11-19 ENCOUNTER — Telehealth: Payer: Self-pay | Admitting: Nurse Practitioner

## 2023-11-19 ENCOUNTER — Telehealth: Payer: Self-pay | Admitting: Family Medicine

## 2023-11-19 NOTE — Telephone Encounter (Signed)
 Called pt but phone is unavailable. Please advise if they call back to remind them about upcoming appt.

## 2023-11-19 NOTE — Telephone Encounter (Signed)
 Called pt to confirm appt. Pt's phone was unavailable at this time.

## 2023-11-20 ENCOUNTER — Ambulatory Visit: Admitting: Nurse Practitioner

## 2023-12-16 ENCOUNTER — Ambulatory Visit (INDEPENDENT_AMBULATORY_CARE_PROVIDER_SITE_OTHER): Payer: Medicaid Other

## 2023-12-16 DIAGNOSIS — I4510 Unspecified right bundle-branch block: Secondary | ICD-10-CM | POA: Diagnosis not present

## 2023-12-17 ENCOUNTER — Ambulatory Visit: Payer: Self-pay | Admitting: Cardiology

## 2023-12-17 LAB — CUP PACEART REMOTE DEVICE CHECK
Battery Remaining Longevity: 68 mo
Battery Remaining Percentage: 74 %
Battery Voltage: 3.01 V
Brady Statistic AP VP Percent: 3.3 %
Brady Statistic AP VS Percent: 1 %
Brady Statistic AS VP Percent: 88 %
Brady Statistic AS VS Percent: 6.7 %
Brady Statistic RA Percent Paced: 1 %
Date Time Interrogation Session: 20250813035752
HighPow Impedance: 65 Ohm
HighPow Impedance: 65 Ohm
Implantable Lead Connection Status: 753985
Implantable Lead Connection Status: 753985
Implantable Lead Connection Status: 753985
Implantable Lead Implant Date: 20240201
Implantable Lead Implant Date: 20240201
Implantable Lead Implant Date: 20240201
Implantable Lead Location: 753858
Implantable Lead Location: 753859
Implantable Lead Location: 753860
Implantable Lead Model: 3830
Implantable Lead Model: 7122
Implantable Pulse Generator Implant Date: 20240201
Lead Channel Impedance Value: 410 Ohm
Lead Channel Impedance Value: 430 Ohm
Lead Channel Impedance Value: 700 Ohm
Lead Channel Pacing Threshold Amplitude: 0.75 V
Lead Channel Pacing Threshold Amplitude: 0.75 V
Lead Channel Pacing Threshold Amplitude: 1 V
Lead Channel Pacing Threshold Pulse Width: 0.5 ms
Lead Channel Pacing Threshold Pulse Width: 0.5 ms
Lead Channel Pacing Threshold Pulse Width: 1 ms
Lead Channel Sensing Intrinsic Amplitude: 0.3 mV
Lead Channel Sensing Intrinsic Amplitude: 8.5 mV
Lead Channel Setting Pacing Amplitude: 1.5 V
Lead Channel Setting Pacing Amplitude: 2 V
Lead Channel Setting Pacing Amplitude: 2.5 V
Lead Channel Setting Pacing Pulse Width: 0.5 ms
Lead Channel Setting Pacing Pulse Width: 0.5 ms
Lead Channel Setting Sensing Sensitivity: 1 mV
Pulse Gen Serial Number: 5559393
Zone Setting Status: 755011

## 2024-01-11 ENCOUNTER — Emergency Department (HOSPITAL_COMMUNITY)

## 2024-01-11 ENCOUNTER — Encounter (HOSPITAL_COMMUNITY): Payer: Self-pay

## 2024-01-11 ENCOUNTER — Inpatient Hospital Stay (HOSPITAL_COMMUNITY)
Admission: EM | Admit: 2024-01-11 | Discharge: 2024-01-21 | DRG: 393 | Disposition: A | Discharge status: Home / Self Care | Attending: Internal Medicine | Admitting: Internal Medicine

## 2024-01-11 ENCOUNTER — Other Ambulatory Visit: Payer: Self-pay

## 2024-01-11 DIAGNOSIS — M25561 Pain in right knee: Secondary | ICD-10-CM | POA: Diagnosis present

## 2024-01-11 DIAGNOSIS — K760 Fatty (change of) liver, not elsewhere classified: Secondary | ICD-10-CM | POA: Diagnosis present

## 2024-01-11 DIAGNOSIS — I4892 Unspecified atrial flutter: Secondary | ICD-10-CM | POA: Diagnosis present

## 2024-01-11 DIAGNOSIS — Z91148 Patient's other noncompliance with medication regimen for other reason: Secondary | ICD-10-CM

## 2024-01-11 DIAGNOSIS — I4510 Unspecified right bundle-branch block: Secondary | ICD-10-CM | POA: Diagnosis not present

## 2024-01-11 DIAGNOSIS — K2950 Unspecified chronic gastritis without bleeding: Secondary | ICD-10-CM | POA: Diagnosis not present

## 2024-01-11 DIAGNOSIS — Z818 Family history of other mental and behavioral disorders: Secondary | ICD-10-CM

## 2024-01-11 DIAGNOSIS — I252 Old myocardial infarction: Secondary | ICD-10-CM

## 2024-01-11 DIAGNOSIS — I5023 Acute on chronic systolic (congestive) heart failure: Secondary | ICD-10-CM | POA: Diagnosis present

## 2024-01-11 DIAGNOSIS — I10 Essential (primary) hypertension: Secondary | ICD-10-CM | POA: Diagnosis not present

## 2024-01-11 DIAGNOSIS — I2510 Atherosclerotic heart disease of native coronary artery without angina pectoris: Secondary | ICD-10-CM | POA: Diagnosis not present

## 2024-01-11 DIAGNOSIS — I081 Rheumatic disorders of both mitral and tricuspid valves: Secondary | ICD-10-CM | POA: Diagnosis present

## 2024-01-11 DIAGNOSIS — N179 Acute kidney failure, unspecified: Secondary | ICD-10-CM | POA: Diagnosis present

## 2024-01-11 DIAGNOSIS — N1832 Chronic kidney disease, stage 3b: Secondary | ICD-10-CM | POA: Diagnosis present

## 2024-01-11 DIAGNOSIS — D649 Anemia, unspecified: Secondary | ICD-10-CM | POA: Diagnosis not present

## 2024-01-11 DIAGNOSIS — Z7901 Long term (current) use of anticoagulants: Secondary | ICD-10-CM

## 2024-01-11 DIAGNOSIS — E871 Hypo-osmolality and hyponatremia: Secondary | ICD-10-CM | POA: Diagnosis present

## 2024-01-11 DIAGNOSIS — D122 Benign neoplasm of ascending colon: Secondary | ICD-10-CM | POA: Diagnosis present

## 2024-01-11 DIAGNOSIS — G928 Other toxic encephalopathy: Secondary | ICD-10-CM | POA: Diagnosis not present

## 2024-01-11 DIAGNOSIS — I361 Nonrheumatic tricuspid (valve) insufficiency: Secondary | ICD-10-CM

## 2024-01-11 DIAGNOSIS — I480 Paroxysmal atrial fibrillation: Secondary | ICD-10-CM

## 2024-01-11 DIAGNOSIS — I4891 Unspecified atrial fibrillation: Secondary | ICD-10-CM | POA: Insufficient documentation

## 2024-01-11 DIAGNOSIS — I4819 Other persistent atrial fibrillation: Secondary | ICD-10-CM | POA: Diagnosis not present

## 2024-01-11 DIAGNOSIS — Z7189 Other specified counseling: Secondary | ICD-10-CM | POA: Diagnosis not present

## 2024-01-11 DIAGNOSIS — I509 Heart failure, unspecified: Secondary | ICD-10-CM

## 2024-01-11 DIAGNOSIS — Z7984 Long term (current) use of oral hypoglycemic drugs: Secondary | ICD-10-CM

## 2024-01-11 DIAGNOSIS — K921 Melena: Secondary | ICD-10-CM

## 2024-01-11 DIAGNOSIS — N289 Disorder of kidney and ureter, unspecified: Secondary | ICD-10-CM

## 2024-01-11 DIAGNOSIS — I8500 Esophageal varices without bleeding: Secondary | ICD-10-CM | POA: Diagnosis not present

## 2024-01-11 DIAGNOSIS — I452 Bifascicular block: Secondary | ICD-10-CM | POA: Diagnosis present

## 2024-01-11 DIAGNOSIS — Z79899 Other long term (current) drug therapy: Secondary | ICD-10-CM

## 2024-01-11 DIAGNOSIS — D61818 Other pancytopenia: Secondary | ICD-10-CM | POA: Diagnosis not present

## 2024-01-11 DIAGNOSIS — K7460 Unspecified cirrhosis of liver: Secondary | ICD-10-CM | POA: Diagnosis present

## 2024-01-11 DIAGNOSIS — I8510 Secondary esophageal varices without bleeding: Secondary | ICD-10-CM | POA: Diagnosis present

## 2024-01-11 DIAGNOSIS — E785 Hyperlipidemia, unspecified: Secondary | ICD-10-CM | POA: Diagnosis present

## 2024-01-11 DIAGNOSIS — R42 Dizziness and giddiness: Secondary | ICD-10-CM | POA: Diagnosis not present

## 2024-01-11 DIAGNOSIS — F1210 Cannabis abuse, uncomplicated: Secondary | ICD-10-CM | POA: Diagnosis present

## 2024-01-11 DIAGNOSIS — I059 Rheumatic mitral valve disease, unspecified: Secondary | ICD-10-CM

## 2024-01-11 DIAGNOSIS — E874 Mixed disorder of acid-base balance: Secondary | ICD-10-CM | POA: Diagnosis present

## 2024-01-11 DIAGNOSIS — N401 Enlarged prostate with lower urinary tract symptoms: Secondary | ICD-10-CM

## 2024-01-11 DIAGNOSIS — Z8572 Personal history of non-Hodgkin lymphomas: Secondary | ICD-10-CM | POA: Diagnosis not present

## 2024-01-11 DIAGNOSIS — F319 Bipolar disorder, unspecified: Secondary | ICD-10-CM | POA: Diagnosis present

## 2024-01-11 DIAGNOSIS — M109 Gout, unspecified: Secondary | ICD-10-CM | POA: Diagnosis present

## 2024-01-11 DIAGNOSIS — I428 Other cardiomyopathies: Secondary | ICD-10-CM | POA: Diagnosis present

## 2024-01-11 DIAGNOSIS — K3189 Other diseases of stomach and duodenum: Secondary | ICD-10-CM | POA: Diagnosis not present

## 2024-01-11 DIAGNOSIS — R0602 Shortness of breath: Secondary | ICD-10-CM | POA: Diagnosis not present

## 2024-01-11 DIAGNOSIS — G4733 Obstructive sleep apnea (adult) (pediatric): Secondary | ICD-10-CM | POA: Diagnosis not present

## 2024-01-11 DIAGNOSIS — Z95810 Presence of automatic (implantable) cardiac defibrillator: Secondary | ICD-10-CM

## 2024-01-11 DIAGNOSIS — J811 Chronic pulmonary edema: Secondary | ICD-10-CM | POA: Diagnosis not present

## 2024-01-11 DIAGNOSIS — K922 Gastrointestinal hemorrhage, unspecified: Principal | ICD-10-CM | POA: Diagnosis present

## 2024-01-11 DIAGNOSIS — I517 Cardiomegaly: Secondary | ICD-10-CM | POA: Diagnosis not present

## 2024-01-11 DIAGNOSIS — K317 Polyp of stomach and duodenum: Principal | ICD-10-CM | POA: Diagnosis present

## 2024-01-11 DIAGNOSIS — G8929 Other chronic pain: Secondary | ICD-10-CM | POA: Diagnosis present

## 2024-01-11 DIAGNOSIS — I5022 Chronic systolic (congestive) heart failure: Secondary | ICD-10-CM

## 2024-01-11 DIAGNOSIS — D62 Acute posthemorrhagic anemia: Secondary | ICD-10-CM | POA: Diagnosis present

## 2024-01-11 DIAGNOSIS — K640 First degree hemorrhoids: Secondary | ICD-10-CM | POA: Diagnosis present

## 2024-01-11 DIAGNOSIS — I5082 Biventricular heart failure: Secondary | ICD-10-CM | POA: Diagnosis not present

## 2024-01-11 DIAGNOSIS — F17210 Nicotine dependence, cigarettes, uncomplicated: Secondary | ICD-10-CM | POA: Diagnosis present

## 2024-01-11 DIAGNOSIS — I130 Hypertensive heart and chronic kidney disease with heart failure and stage 1 through stage 4 chronic kidney disease, or unspecified chronic kidney disease: Secondary | ICD-10-CM | POA: Diagnosis present

## 2024-01-11 DIAGNOSIS — R109 Unspecified abdominal pain: Secondary | ICD-10-CM | POA: Diagnosis not present

## 2024-01-11 DIAGNOSIS — Z955 Presence of coronary angioplasty implant and graft: Secondary | ICD-10-CM

## 2024-01-11 DIAGNOSIS — K635 Polyp of colon: Secondary | ICD-10-CM | POA: Diagnosis not present

## 2024-01-11 DIAGNOSIS — N1831 Chronic kidney disease, stage 3a: Secondary | ICD-10-CM | POA: Diagnosis not present

## 2024-01-11 DIAGNOSIS — M5441 Lumbago with sciatica, right side: Secondary | ICD-10-CM | POA: Diagnosis present

## 2024-01-11 DIAGNOSIS — D123 Benign neoplasm of transverse colon: Secondary | ICD-10-CM | POA: Diagnosis present

## 2024-01-11 DIAGNOSIS — D509 Iron deficiency anemia, unspecified: Secondary | ICD-10-CM | POA: Diagnosis not present

## 2024-01-11 DIAGNOSIS — F4310 Post-traumatic stress disorder, unspecified: Secondary | ICD-10-CM | POA: Diagnosis present

## 2024-01-11 DIAGNOSIS — Z841 Family history of disorders of kidney and ureter: Secondary | ICD-10-CM

## 2024-01-11 DIAGNOSIS — C859A Non-Hodgkin lymphoma, unspecified, in remission: Secondary | ICD-10-CM | POA: Diagnosis present

## 2024-01-11 DIAGNOSIS — R55 Syncope and collapse: Secondary | ICD-10-CM | POA: Diagnosis not present

## 2024-01-11 DIAGNOSIS — D125 Benign neoplasm of sigmoid colon: Secondary | ICD-10-CM | POA: Diagnosis present

## 2024-01-11 DIAGNOSIS — I5020 Unspecified systolic (congestive) heart failure: Secondary | ICD-10-CM | POA: Diagnosis not present

## 2024-01-11 DIAGNOSIS — J4520 Mild intermittent asthma, uncomplicated: Secondary | ICD-10-CM | POA: Diagnosis present

## 2024-01-11 DIAGNOSIS — Z515 Encounter for palliative care: Secondary | ICD-10-CM | POA: Diagnosis not present

## 2024-01-11 DIAGNOSIS — K2980 Duodenitis without bleeding: Secondary | ICD-10-CM | POA: Diagnosis not present

## 2024-01-11 DIAGNOSIS — M1A09X0 Idiopathic chronic gout, multiple sites, without tophus (tophi): Secondary | ICD-10-CM

## 2024-01-11 DIAGNOSIS — I9589 Other hypotension: Secondary | ICD-10-CM | POA: Diagnosis present

## 2024-01-11 DIAGNOSIS — D500 Iron deficiency anemia secondary to blood loss (chronic): Secondary | ICD-10-CM

## 2024-01-11 DIAGNOSIS — E782 Mixed hyperlipidemia: Secondary | ICD-10-CM | POA: Diagnosis not present

## 2024-01-11 DIAGNOSIS — Z8249 Family history of ischemic heart disease and other diseases of the circulatory system: Secondary | ICD-10-CM

## 2024-01-11 DIAGNOSIS — R570 Cardiogenic shock: Secondary | ICD-10-CM | POA: Diagnosis not present

## 2024-01-11 DIAGNOSIS — K2990 Gastroduodenitis, unspecified, without bleeding: Secondary | ICD-10-CM | POA: Diagnosis not present

## 2024-01-11 DIAGNOSIS — N400 Enlarged prostate without lower urinary tract symptoms: Secondary | ICD-10-CM | POA: Diagnosis present

## 2024-01-11 DIAGNOSIS — Z8673 Personal history of transient ischemic attack (TIA), and cerebral infarction without residual deficits: Secondary | ICD-10-CM

## 2024-01-11 DIAGNOSIS — Z9221 Personal history of antineoplastic chemotherapy: Secondary | ICD-10-CM

## 2024-01-11 DIAGNOSIS — R079 Chest pain, unspecified: Secondary | ICD-10-CM | POA: Diagnosis not present

## 2024-01-11 DIAGNOSIS — Z5982 Transportation insecurity: Secondary | ICD-10-CM

## 2024-01-11 DIAGNOSIS — F1411 Cocaine abuse, in remission: Secondary | ICD-10-CM

## 2024-01-11 DIAGNOSIS — Z452 Encounter for adjustment and management of vascular access device: Secondary | ICD-10-CM | POA: Diagnosis not present

## 2024-01-11 DIAGNOSIS — E876 Hypokalemia: Secondary | ICD-10-CM | POA: Diagnosis present

## 2024-01-11 DIAGNOSIS — I429 Cardiomyopathy, unspecified: Secondary | ICD-10-CM

## 2024-01-11 DIAGNOSIS — K449 Diaphragmatic hernia without obstruction or gangrene: Secondary | ICD-10-CM | POA: Diagnosis present

## 2024-01-11 LAB — URINALYSIS, ROUTINE W REFLEX MICROSCOPIC
Bilirubin Urine: NEGATIVE
Glucose, UA: NEGATIVE mg/dL
Hgb urine dipstick: NEGATIVE
Ketones, ur: NEGATIVE mg/dL
Leukocytes,Ua: NEGATIVE
Nitrite: NEGATIVE
Protein, ur: 100 mg/dL — AB
Specific Gravity, Urine: 1.017 (ref 1.005–1.030)
pH: 5 (ref 5.0–8.0)

## 2024-01-11 LAB — CBC
HCT: 29.4 % — ABNORMAL LOW (ref 39.0–52.0)
Hemoglobin: 8.6 g/dL — ABNORMAL LOW (ref 13.0–17.0)
MCH: 20 pg — ABNORMAL LOW (ref 26.0–34.0)
MCHC: 29.3 g/dL — ABNORMAL LOW (ref 30.0–36.0)
MCV: 68.5 fL — ABNORMAL LOW (ref 80.0–100.0)
Platelets: 186 K/uL (ref 150–400)
RBC: 4.29 MIL/uL (ref 4.22–5.81)
RDW: 21.2 % — ABNORMAL HIGH (ref 11.5–15.5)
WBC: 6.2 K/uL (ref 4.0–10.5)
nRBC: 0 % (ref 0.0–0.2)

## 2024-01-11 LAB — I-STAT CHEM 8, ED
BUN: 22 mg/dL — ABNORMAL HIGH (ref 6–20)
Calcium, Ion: 1.23 mmol/L (ref 1.15–1.40)
Chloride: 104 mmol/L (ref 98–111)
Creatinine, Ser: 1.8 mg/dL — ABNORMAL HIGH (ref 0.61–1.24)
Glucose, Bld: 80 mg/dL (ref 70–99)
HCT: 32 % — ABNORMAL LOW (ref 39.0–52.0)
Hemoglobin: 10.9 g/dL — ABNORMAL LOW (ref 13.0–17.0)
Potassium: 4.2 mmol/L (ref 3.5–5.1)
Sodium: 137 mmol/L (ref 135–145)
TCO2: 19 mmol/L — ABNORMAL LOW (ref 22–32)

## 2024-01-11 LAB — BASIC METABOLIC PANEL WITH GFR
Anion gap: 15 (ref 5–15)
BUN: 24 mg/dL — ABNORMAL HIGH (ref 6–20)
CO2: 16 mmol/L — ABNORMAL LOW (ref 22–32)
Calcium: 9.9 mg/dL (ref 8.9–10.3)
Chloride: 103 mmol/L (ref 98–111)
Creatinine, Ser: 1.76 mg/dL — ABNORMAL HIGH (ref 0.61–1.24)
GFR, Estimated: 44 mL/min — ABNORMAL LOW (ref >60)
Glucose, Bld: 84 mg/dL (ref 70–99)
Potassium: 4.2 mmol/L (ref 3.5–5.1)
Sodium: 134 mmol/L — ABNORMAL LOW (ref 135–145)

## 2024-01-11 LAB — ABO/RH: ABO/RH(D): B POS

## 2024-01-11 LAB — TROPONIN I (HIGH SENSITIVITY)
Troponin I (High Sensitivity): 55 ng/L — ABNORMAL HIGH (ref <18)
Troponin I (High Sensitivity): 42 ng/L — ABNORMAL HIGH (ref <18)

## 2024-01-11 MED ORDER — IRBESARTAN 75 MG PO TABS
37.5000 mg | ORAL_TABLET | Freq: Every day | ORAL | Status: DC
  Administered 2024-01-11: 37.5 mg via ORAL
  Filled 2024-01-11 (×2): qty 0.5

## 2024-01-11 MED ORDER — AMIODARONE HCL 100 MG PO TABS
100.0000 mg | ORAL_TABLET | Freq: Every day | ORAL | Status: DC
  Administered 2024-01-11 – 2024-01-17 (×6): 100 mg via ORAL
  Filled 2024-01-11 (×6): qty 1

## 2024-01-11 MED ORDER — PANTOPRAZOLE SODIUM 40 MG IV SOLR
40.0000 mg | INTRAVENOUS | Status: AC
  Administered 2024-01-11: 40 mg via INTRAVENOUS
  Filled 2024-01-11: qty 10

## 2024-01-11 MED ORDER — PANTOPRAZOLE SODIUM 40 MG IV SOLR
40.0000 mg | Freq: Two times a day (BID) | INTRAVENOUS | Status: DC
  Administered 2024-01-11 – 2024-01-21 (×19): 40 mg via INTRAVENOUS
  Filled 2024-01-11 (×19): qty 10

## 2024-01-11 MED ORDER — DILTIAZEM HCL 25 MG/5ML IV SOLN
15.0000 mg | Freq: Once | INTRAVENOUS | Status: AC
  Administered 2024-01-11: 15 mg via INTRAVENOUS
  Filled 2024-01-11: qty 5

## 2024-01-11 MED ORDER — SODIUM CHLORIDE 0.9 % IV SOLN
Freq: Once | INTRAVENOUS | Status: AC
Start: 2024-01-11 — End: 2024-01-12

## 2024-01-11 NOTE — ED Notes (Signed)
 [facility] called.

## 2024-01-11 NOTE — ED Triage Notes (Signed)
 Pt BIB [facility] for chest pressure that radiates down his abd that has been getting progressively worse the last 5-6 hrs. Also reports he has been having some difficulty urinating & some retention the last week as well. Endorses being out of his Torsemide  the last week, 12L for EMS showed A-Fib (no Hx of per pt) & Rt BBB. Was given 324 ASA & 1 Nitroglycerin  & his pain went from 8/10 to 4/10, A/Ox4.
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
## 2024-01-11 NOTE — ED Notes (Signed)
 Pt taken to xray

## 2024-01-11 NOTE — ED Notes (Signed)
Pt able to provide urine sample

## 2024-01-11 NOTE — ED Provider Notes (Signed)
  EMERGENCY DEPARTMENT AT [facility] Provider Note   CSN: [id number] Arrival date & time: 01/11/24  1705     Patient presents with: Chest Pain   Cody Guzman is a 58 y.o. male.    Chest Pain  This patient is a 58 year old male, he reports that he has a history of congestive heart failure and a prior stroke, he is supposed to be taking medications such as amiodarone , Eliquis , Jardiance , metoprolol , Seroquel , spironolactone  and Flomax  but states he is out of all of his medications except for Eliquis  and potassium supplements.  The patient has a pacemaker, he is followed by his family doctor who is a nurse practitioner [employer],  Based on the medical record the patient had a cardiology visit on August 17, 2023, he has a history of a nonischemic cardiomyopathy, atrial fibrillation and atrial flutter, marijuana abuse and heart failure.  It is not infrequent for him to run out of his medications and in fact at that visit he had claimed to be out of his torsemide  for about 5 weeks.  He states that today while he was in his class where he is taking a criminal justice class he actually passed out when he was feeling bad having chest pain, when he came around the people were all around him and the paramedics were called.  They found the patient be in atrial fibrillation and transport him to the hospital.  He complains notably of chest pain but also having palpitations, he complains of not being able to urinate well for the last week stating that it burns and he only gets a small amount out.  No fevers no chills, he is not short of breath, he does not have any swelling in his legs.  He denies any blood in the stools    Prior to Admission medications   Medication Sig Start Date End Date Taking? Authorizing Provider  albuterol  (PROVENTIL ) (2.5 MG/3ML) 0.083% nebulizer solution Take 3 mLs (2.5 mg total) by nebulization every 4 (four) hours as needed for wheezing or shortness of  breath. 08/17/23   Fleming, Zelda W, NP  albuterol  (VENTOLIN  HFA) 108 (90 Base) MCG/ACT inhaler Inhale 1-2 puffs into the lungs every 6 (six) hours as needed for wheezing or shortness of breath. 04/21/23   Celestia Rosaline SQUIBB, NP  allopurinol  (ZYLOPRIM ) 100 MG tablet Take 1 tablet (100 mg total) by mouth daily. 08/17/23   Fleming, Zelda W, NP  amiodarone  (PACERONE ) 200 MG tablet Take 0.5 tablets (100 mg total) by mouth daily. 08/17/23   Aniceto Daphne CROME, NP  apixaban  (ELIQUIS ) 5 MG TABS tablet Take 1 tablet (5 mg total) by mouth 2 (two) times daily. 08/17/23   Fleming, Zelda W, NP  atorvastatin  (LIPITOR ) 80 MG tablet Take 1 tablet (80 mg total) by mouth daily. 08/17/23   Servant Haze ORN, NP  colchicine  0.6 MG tablet Take 1 tablet (0.6 mg total) by mouth every other day. 08/17/23   Fleming, Zelda W, NP  empagliflozin  (JARDIANCE ) 10 MG TABS tablet Take 1 tablet (10 mg total) by mouth daily before breakfast. 08/17/23   Fleming, Zelda W, NP  fluticasone  (FLONASE ) 50 MCG/ACT nasal spray Place 2 sprays into both nostrils daily. 05/15/23   Fleming, Zelda W, NP  irbesartan  (AVAPRO ) 75 MG tablet Take 0.5 tablets (37.5 mg total) by mouth daily. 08/17/23   Fleming, Zelda W, NP  loratadine  (CLARITIN ) 10 MG tablet Take 1 tablet (10 mg total) by mouth daily. 05/15/23   Servant,  Zelda W, NP  metoprolol  succinate (TOPROL -XL) 50 MG 24 hr tablet Take 1 tablet (50 mg total) by mouth daily. Take with or immediately following a meal. 07/28/23   Fernande Elspeth BROCKS, MD  nitroGLYCERIN  (NITROSTAT ) 0.4 MG SL tablet Place 1 tablet (0.4 mg total) under the tongue every 5 (five) minutes as needed for chest pain. 05/15/23   Fleming, Zelda W, NP  potassium chloride  SA (KLOR-CON  M) 20 MEQ tablet Take 1 tablet (20 mEq total) by mouth daily. 08/17/23   Fleming, Zelda W, NP  QUEtiapine  (SEROQUEL  XR) 300 MG 24 hr tablet Take 1 tablet (300 mg total) by mouth at bedtime. 08/17/23   Fleming, Zelda W, NP  senna (SENOKOT) 8.6 MG tablet Take 1 tablet (8.6 mg  total) by mouth daily. Stool softener 02/11/23   Fleming, Zelda W, NP  spironolactone  (ALDACTONE ) 25 MG tablet Take 1 tablet (25 mg total) by mouth daily. 08/17/23   Fleming, Zelda W, NP  SUMAtriptan  (IMITREX ) 50 MG tablet Take 1 tablet (50 mg total) by mouth once for 1 dose. May repeat in 2 hours if headache persists or recurs. May not exceed 2 tablets in 24 hours Patient not taking: Reported on 08/17/2023 05/15/23 05/15/23  Fleming, Zelda W, NP  tamsulosin  (FLOMAX ) 0.4 MG CAPS capsule Take 1 capsule (0.4 mg total) by mouth daily. 05/15/23   Fleming, Zelda W, NP  topiramate  (TOPAMAX ) 25 MG capsule Take 1-2 capsules (25-50 mg total) by mouth daily for headaches 08/17/23   Fleming, Zelda W, NP  torsemide  (DEMADEX ) 20 MG tablet Take 1 tablet (20 mg total) by mouth daily. 09/15/23   Aniceto Daphne CROME, NP    Allergies: Patient has no known allergies.    Review of Systems  Cardiovascular:  Positive for chest pain.  All other systems reviewed and are negative.   Updated Vital Signs BP 104/79 (BP Location: Right Arm)   Pulse (!) 135   Temp 97.6 F (36.4 C) (Oral)   Resp (!) 24   SpO2 100%   Physical Exam Vitals and nursing note reviewed.  Constitutional:      General: He is not in acute distress.    Appearance: He is well-developed.  HENT:     Head: Normocephalic and atraumatic.     Nose: Nose normal.     Mouth/Throat:     Mouth: Mucous membranes are moist.     Pharynx: No oropharyngeal exudate.  Eyes:     General: No scleral icterus.       Right eye: No discharge.        Left eye: No discharge.     Conjunctiva/sclera: Conjunctivae normal.     Pupils: Pupils are equal, round, and reactive to light.  Neck:     Thyroid : No thyromegaly.     Vascular: No JVD.  Cardiovascular:     Rate and Rhythm: Normal rate. Rhythm irregular.     Heart sounds: Normal heart sounds. No murmur heard.    No friction rub. No gallop.  Pulmonary:     Effort: Pulmonary effort is normal. No respiratory distress.      Breath sounds: Normal breath sounds. No wheezing or rales.  Abdominal:     General: Bowel sounds are normal. There is no distension.     Palpations: Abdomen is soft. There is no mass.     Tenderness: There is no abdominal tenderness.     Comments: No abdominal tenderness to palpation  Musculoskeletal:        General:  No tenderness. Normal range of motion.     Cervical back: Normal range of motion and neck supple.     Right lower leg: No edema.     Left lower leg: No edema.  Lymphadenopathy:     Cervical: No cervical adenopathy.  Skin:    General: Skin is warm and dry.     Findings: No erythema or rash.  Neurological:     Mental Status: He is alert.     Coordination: Coordination normal.  Psychiatric:        Behavior: Behavior normal.     (all labs ordered are listed, but only abnormal results are displayed) Labs Reviewed  BASIC METABOLIC PANEL WITH GFR - Abnormal; Notable for the following components:      Result Value   Sodium 134 (*)    CO2 16 (*)    BUN 24 (*)    Creatinine, Ser 1.76 (*)    GFR, Estimated 44 (*)    All other components within normal limits  CBC - Abnormal; Notable for the following components:   Hemoglobin 8.6 (*)    HCT 29.4 (*)    MCV 68.5 (*)    MCH 20.0 (*)    MCHC 29.3 (*)    RDW 21.2 (*)    All other components within normal limits  I-STAT CHEM 8, ED - Abnormal; Notable for the following components:   BUN 22 (*)    Creatinine, Ser 1.80 (*)    TCO2 19 (*)    Hemoglobin 10.9 (*)    HCT 32.0 (*)    All other components within normal limits  TROPONIN I (HIGH SENSITIVITY) - Abnormal; Notable for the following components:   Troponin I (High Sensitivity) 55 (*)    All other components within normal limits  TROPONIN I (HIGH SENSITIVITY) - Abnormal; Notable for the following components:   Troponin I (High Sensitivity) 42 (*)    All other components within normal limits  URINALYSIS, ROUTINE W REFLEX MICROSCOPIC  POC OCCULT BLOOD, ED  TYPE  AND SCREEN  ABO/RH    EKG: EKG Interpretation Date/Time:  Monday January 11 2024 17:33:43 EDT Ventricular Rate:  102 PR Interval:    QRS Duration:  160 QT Interval:  434 QTC Calculation: 565 R Axis:   -73  Text Interpretation: Atrial fibrillation with rapid ventricular response with premature ventricular or aberrantly conducted complexes Left axis deviation Non-specific intra-ventricular conduction block Inferior infarct , age undetermined Cannot rule out Anterior infarct , age undetermined Abnormal ECG When compared with ECG of 11-Jan-2024 17:13, PREVIOUS ECG IS PRESENT Since last tracing Atrial fibrillation now present. Confirmed by Cleotilde Rogue ([id number]) on 01/11/2024 9:08:20 PM  Radiology: DG Chest 2 View Result Date: 01/11/2024 CLINICAL DATA:  Chest pain. EXAM: CHEST - 2 VIEW COMPARISON:  June 05, 2022 FINDINGS: There is stable multi lead AICD positioning. The cardiac silhouette is enlarged and unchanged in size. Both lungs are clear. The visualized skeletal structures are unremarkable. IMPRESSION: Stable cardiomegaly without active cardiopulmonary disease. Electronically Signed   By: Suzen Dials M.D.   On: 01/11/2024 19:04     .Critical Care  Performed by: Cleotilde Rogue, MD Authorized by: Cleotilde Rogue, MD   Critical care provider statement:    Critical care time (minutes):  45   Critical care time was exclusive of:  Separately billable procedures and treating other patients and teaching time   Critical care was necessary to treat or prevent imminent or life-threatening deterioration of the following conditions:  Cardiac failure (severe anemia)   Critical care was time spent personally by me on the following activities:  Development of treatment plan with patient or surrogate, discussions with consultants, evaluation of patient's response to treatment, examination of patient, obtaining history from patient or surrogate, review of old charts, re-evaluation of patient's  condition, pulse oximetry, ordering and review of radiographic studies, ordering and review of laboratory studies and ordering and performing treatments and interventions   Care discussed with: admitting provider      Medications Ordered in the ED  0.9 %  sodium chloride  infusion (has no administration in time range)  diltiazem  (CARDIZEM ) injection 15 mg (has no administration in time range)  pantoprazole  (PROTONIX ) injection 40 mg (has no administration in time range)  amiodarone  (PACERONE ) tablet 100 mg (has no administration in time range)  irbesartan  (AVAPRO ) tablet 37.5 mg (has no administration in time range)    Clinical Course as of 01/11/24 2134  Mon Jan 11, 2024  2133 I personally performed the Hemoccult which was negative, the stool was greenish-brown [BM]    Clinical Course User Index [BM] Cleotilde Rogue, MD                                 Medical Decision Making Amount and/or Complexity of Data Reviewed Labs: ordered. Radiology: ordered.  Risk Prescription drug management. Decision regarding hospitalization.    This patient presents to the ED for concern of some intermittent chest pain and syncopal episode and not being able to urinate well, this involves an extensive number of treatment options, and is a complaint that carries with it a high risk of complications and morbidity.  The differential diagnosis includes medication noncompliance, worsening heart failure, he also has other issues such as new anemia   Co morbidities / Chronic conditions that complicate the patient evaluation  Heart failure, medication noncompliance, nonischemic cardiomyopathy, drug use   Additional history obtained:  Additional history obtained from EMR External records from outside source obtained and reviewed including medical record including prior office visits to cardiology   Lab Tests:  I Ordered, and personally interpreted labs.  The pertinent results include: New anemia with  about a 5 g drop in hemoglobin, renal function is worse than it has been, troponin is low elevated consistent with multiple prior values   Imaging Studies ordered:  I ordered imaging studies including chest x-ray I independently visualized and interpreted imaging which showed no acute findings to suggest anything other than mild cardiomegaly I agree with the radiologist interpretation   Cardiac Monitoring: / EKG:  The patient was maintained on a cardiac monitor.  I personally viewed and interpreted the cardiac monitored which showed an underlying rhythm of: Atrial fibrillation, wide-complex   Problem List / ED Course / Critical interventions / Medication management  Discussed the care with Dr. Franky who will admit Hemoccult was negative, patient started on Protonix , elevated BUN makes me suspicious for upper GI bleed given his history of black stools for 4 or 5 days I ordered medication including Protonix , Cardizem  Reevaluation of the patient after these medicines showed that the patient no significant changes, patient had type and screen but does not need to be immediately transfused I have reviewed the patients home medicines and have made adjustments as needed   Consultations Obtained:  I requested consultation with the hospitalist Dr. Franky,  and discussed lab and imaging findings as well as pertinent plan - they  recommend: Admission   Social Determinants of Health:  Noncompliant with medications   Test / Admission - Considered:  Admit to higher level of care      Final diagnoses:  Upper GI bleed  Atrial fibrillation with rapid ventricular response (HCC)  Anemia, unspecified type  Renal insufficiency    ED Discharge Orders     None          Cleotilde Rogue, MD 01/11/24 2135

## 2024-01-12 ENCOUNTER — Encounter (HOSPITAL_COMMUNITY): Payer: Self-pay | Admitting: Internal Medicine

## 2024-01-12 ENCOUNTER — Observation Stay (HOSPITAL_COMMUNITY)

## 2024-01-12 DIAGNOSIS — R079 Chest pain, unspecified: Secondary | ICD-10-CM

## 2024-01-12 DIAGNOSIS — R55 Syncope and collapse: Secondary | ICD-10-CM | POA: Diagnosis not present

## 2024-01-12 DIAGNOSIS — K922 Gastrointestinal hemorrhage, unspecified: Secondary | ICD-10-CM | POA: Diagnosis not present

## 2024-01-12 DIAGNOSIS — Z7901 Long term (current) use of anticoagulants: Secondary | ICD-10-CM | POA: Diagnosis not present

## 2024-01-12 DIAGNOSIS — N179 Acute kidney failure, unspecified: Secondary | ICD-10-CM

## 2024-01-12 DIAGNOSIS — N1831 Chronic kidney disease, stage 3a: Secondary | ICD-10-CM | POA: Insufficient documentation

## 2024-01-12 DIAGNOSIS — K921 Melena: Secondary | ICD-10-CM | POA: Diagnosis not present

## 2024-01-12 DIAGNOSIS — R932 Abnormal findings on diagnostic imaging of liver and biliary tract: Secondary | ICD-10-CM | POA: Diagnosis not present

## 2024-01-12 DIAGNOSIS — R109 Unspecified abdominal pain: Secondary | ICD-10-CM | POA: Diagnosis not present

## 2024-01-12 DIAGNOSIS — K760 Fatty (change of) liver, not elsewhere classified: Secondary | ICD-10-CM | POA: Diagnosis not present

## 2024-01-12 LAB — CBC
HCT: 26.1 % — ABNORMAL LOW (ref 39.0–52.0)
HCT: 30.2 % — ABNORMAL LOW (ref 39.0–52.0)
Hemoglobin: 7.6 g/dL — ABNORMAL LOW (ref 13.0–17.0)
Hemoglobin: 8.8 g/dL — ABNORMAL LOW (ref 13.0–17.0)
MCH: 20 pg — ABNORMAL LOW (ref 26.0–34.0)
MCH: 20.5 pg — ABNORMAL LOW (ref 26.0–34.0)
MCHC: 29.1 g/dL — ABNORMAL LOW (ref 30.0–36.0)
MCHC: 29.1 g/dL — ABNORMAL LOW (ref 30.0–36.0)
MCV: 68.6 fL — ABNORMAL LOW (ref 80.0–100.0)
MCV: 70.4 fL — ABNORMAL LOW (ref 80.0–100.0)
Platelets: 158 K/uL (ref 150–400)
Platelets: 170 K/uL (ref 150–400)
RBC: 3.71 MIL/uL — ABNORMAL LOW (ref 4.22–5.81)
RBC: 4.4 MIL/uL (ref 4.22–5.81)
RDW: 21.1 % — ABNORMAL HIGH (ref 11.5–15.5)
RDW: 21.2 % — ABNORMAL HIGH (ref 11.5–15.5)
WBC: 4.7 K/uL (ref 4.0–10.5)
WBC: 4.7 K/uL (ref 4.0–10.5)
nRBC: 0 % (ref 0.0–0.2)
nRBC: 0 % (ref 0.0–0.2)

## 2024-01-12 LAB — IRON AND TIBC
Iron: 16 ug/dL — ABNORMAL LOW (ref 45–182)
Saturation Ratios: 3 % — ABNORMAL LOW (ref 17.9–39.5)
TIBC: 497 ug/dL — ABNORMAL HIGH (ref 250–450)
UIBC: 481 ug/dL

## 2024-01-12 LAB — COMPREHENSIVE METABOLIC PANEL WITH GFR
ALT: 49 U/L — ABNORMAL HIGH (ref 0–44)
AST: 61 U/L — ABNORMAL HIGH (ref 15–41)
Albumin: 2.8 g/dL — ABNORMAL LOW (ref 3.5–5.0)
Alkaline Phosphatase: 104 U/L (ref 38–126)
Anion gap: 11 (ref 5–15)
BUN: 23 mg/dL — ABNORMAL HIGH (ref 6–20)
CO2: 17 mmol/L — ABNORMAL LOW (ref 22–32)
Calcium: 9.2 mg/dL (ref 8.9–10.3)
Chloride: 105 mmol/L (ref 98–111)
Creatinine, Ser: 1.59 mg/dL — ABNORMAL HIGH (ref 0.61–1.24)
GFR, Estimated: 49 mL/min — ABNORMAL LOW (ref >60)
Glucose, Bld: 77 mg/dL (ref 70–99)
Potassium: 3.7 mmol/L (ref 3.5–5.1)
Sodium: 133 mmol/L — ABNORMAL LOW (ref 135–145)
Total Bilirubin: 4.6 mg/dL — ABNORMAL HIGH (ref 0.0–1.2)
Total Protein: 6 g/dL — ABNORMAL LOW (ref 6.5–8.1)

## 2024-01-12 LAB — I-STAT VENOUS BLOOD GAS, ED
Acid-base deficit: 5 mmol/L — ABNORMAL HIGH (ref 0.0–2.0)
Bicarbonate: 18.3 mmol/L — ABNORMAL LOW (ref 20.0–28.0)
Calcium, Ion: 1.19 mmol/L (ref 1.15–1.40)
HCT: 25 % — ABNORMAL LOW (ref 39.0–52.0)
Hemoglobin: 8.5 g/dL — ABNORMAL LOW (ref 13.0–17.0)
O2 Saturation: 100 %
Potassium: 3.7 mmol/L (ref 3.5–5.1)
Sodium: 138 mmol/L (ref 135–145)
TCO2: 19 mmol/L — ABNORMAL LOW (ref 22–32)
pCO2, Ven: 26.3 mmHg — ABNORMAL LOW (ref 44–60)
pH, Ven: 7.449 — ABNORMAL HIGH (ref 7.25–7.43)
pO2, Ven: 157 mmHg — ABNORMAL HIGH (ref 32–45)

## 2024-01-12 LAB — LIPASE, BLOOD: Lipase: 31 U/L (ref 11–51)

## 2024-01-12 LAB — HEPATIC FUNCTION PANEL
ALT: 56 U/L — ABNORMAL HIGH (ref 0–44)
AST: 77 U/L — ABNORMAL HIGH (ref 15–41)
Albumin: 3.3 g/dL — ABNORMAL LOW (ref 3.5–5.0)
Alkaline Phosphatase: 121 U/L (ref 38–126)
Bilirubin, Direct: 1.9 mg/dL — ABNORMAL HIGH (ref 0.0–0.2)
Indirect Bilirubin: 3.6 mg/dL — ABNORMAL HIGH (ref 0.3–0.9)
Total Bilirubin: 5.5 mg/dL — ABNORMAL HIGH (ref 0.0–1.2)
Total Protein: 6.8 g/dL (ref 6.5–8.1)

## 2024-01-12 LAB — RAPID URINE DRUG SCREEN, HOSP PERFORMED
Amphetamines: NOT DETECTED
Barbiturates: NOT DETECTED
Benzodiazepines: NOT DETECTED
Cocaine: NOT DETECTED
Opiates: NOT DETECTED
Tetrahydrocannabinol: POSITIVE — AB

## 2024-01-12 LAB — RETICULOCYTES
Immature Retic Fract: 40.6 % — ABNORMAL HIGH (ref 2.3–15.9)
RBC.: 4.16 MIL/uL — ABNORMAL LOW (ref 4.22–5.81)
Retic Count, Absolute: 85.3 K/uL (ref 19.0–186.0)
Retic Ct Pct: 2.1 % (ref 0.4–3.1)

## 2024-01-12 LAB — HIV ANTIBODY (ROUTINE TESTING W REFLEX): HIV Screen 4th Generation wRfx: NONREACTIVE

## 2024-01-12 LAB — GLUCOSE, CAPILLARY
Glucose-Capillary: 125 mg/dL — ABNORMAL HIGH (ref 70–99)
Glucose-Capillary: 123 mg/dL — ABNORMAL HIGH (ref 70–99)

## 2024-01-12 LAB — TYPE AND SCREEN
ABO/RH(D): B POS
Antibody Screen: NEGATIVE

## 2024-01-12 LAB — FOLATE: Folate: 10.8 ng/mL (ref >5.9)

## 2024-01-12 LAB — VITAMIN B12: Vitamin B-12: 1710 pg/mL — ABNORMAL HIGH (ref 180–914)

## 2024-01-12 LAB — FERRITIN: Ferritin: 10 ng/mL — ABNORMAL LOW (ref 24–336)

## 2024-01-12 LAB — LACTIC ACID, PLASMA
Lactic Acid, Venous: 2.8 mmol/L (ref 0.5–1.9)
Lactic Acid, Venous: 1.6 mmol/L (ref 0.5–1.9)

## 2024-01-12 MED ORDER — LACTATED RINGERS IV BOLUS
500.0000 mL | Freq: Once | INTRAVENOUS | Status: AC
  Administered 2024-01-12: 500 mL via INTRAVENOUS

## 2024-01-12 MED ORDER — ALBUTEROL SULFATE (2.5 MG/3ML) 0.083% IN NEBU
2.5000 mg | INHALATION_SOLUTION | RESPIRATORY_TRACT | Status: DC | PRN
  Administered 2024-01-14: 2.5 mg via RESPIRATORY_TRACT
  Filled 2024-01-12: qty 3

## 2024-01-12 MED ORDER — QUETIAPINE FUMARATE ER 300 MG PO TB24
300.0000 mg | ORAL_TABLET | Freq: Every day | ORAL | Status: DC
Start: 2024-01-12 — End: 2024-01-15
  Administered 2024-01-12 – 2024-01-13 (×2): 300 mg via ORAL
  Filled 2024-01-12: qty 6
  Filled 2024-01-12 (×3): qty 1

## 2024-01-12 MED ORDER — TOPIRAMATE 25 MG PO TABS
25.0000 mg | ORAL_TABLET | Freq: Every day | ORAL | Status: DC
  Administered 2024-01-13 – 2024-01-21 (×9): 25 mg via ORAL
  Filled 2024-01-12 (×9): qty 1

## 2024-01-12 MED ORDER — ALLOPURINOL 100 MG PO TABS
100.0000 mg | ORAL_TABLET | Freq: Every day | ORAL | Status: DC
  Administered 2024-01-13 – 2024-01-21 (×9): 100 mg via ORAL
  Filled 2024-01-12 (×9): qty 1

## 2024-01-12 MED ORDER — TAMSULOSIN HCL 0.4 MG PO CAPS
0.4000 mg | ORAL_CAPSULE | Freq: Every day | ORAL | Status: DC
Start: 2024-01-12 — End: 2024-01-15
  Administered 2024-01-13 – 2024-01-14 (×2): 0.4 mg via ORAL
  Filled 2024-01-12 (×2): qty 1

## 2024-01-12 MED ORDER — METOPROLOL SUCCINATE ER 50 MG PO TB24
50.0000 mg | ORAL_TABLET | Freq: Every day | ORAL | Status: DC
Start: 2024-01-12 — End: 2024-01-14
  Administered 2024-01-13 – 2024-01-14 (×2): 50 mg via ORAL
  Filled 2024-01-12 (×2): qty 1

## 2024-01-12 MED ORDER — ATORVASTATIN CALCIUM 80 MG PO TABS
80.0000 mg | ORAL_TABLET | Freq: Every day | ORAL | Status: DC
  Administered 2024-01-13 – 2024-01-21 (×9): 80 mg via ORAL
  Filled 2024-01-12 (×9): qty 1

## 2024-01-12 NOTE — Care Management Obs Status (Signed)
 MEDICARE OBSERVATION STATUS NOTIFICATION   Patient Details  Name: Cody Guzman MRN: [id number] Date of Birth: Sep 04, 1965   Medicare Observation Status Notification Given:     Obs notice signed and copy given  Claretta Deed 01/12/2024, 4:09 PM
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
## 2024-01-12 NOTE — Care Management Obs Status (Signed)
 MEDICARE OBSERVATION STATUS NOTIFICATION   Patient Details  Name: Cody Guzman MRN: [id number] Date of Birth: August 15, 1965   Medicare Observation Status Notification Given:  Yes  Obs notice signed and a copy given.  Jamilee Lafosse 01/12/2024, 4:22 PM

## 2024-01-12 NOTE — Plan of Care (Signed)
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
## 2024-01-12 NOTE — ED Notes (Signed)
 MD notified of pt LOC. Pt hard to arouse and lethargic. VBG ordered and collected.

## 2024-01-12 NOTE — Progress Notes (Signed)

## 2024-01-12 NOTE — ED Notes (Signed)
 Phleb stuck twice, no blood

## 2024-01-13 ENCOUNTER — Observation Stay (HOSPITAL_COMMUNITY)

## 2024-01-13 DIAGNOSIS — K922 Gastrointestinal hemorrhage, unspecified: Secondary | ICD-10-CM | POA: Diagnosis not present

## 2024-01-13 DIAGNOSIS — I428 Other cardiomyopathies: Secondary | ICD-10-CM | POA: Diagnosis present

## 2024-01-13 DIAGNOSIS — K3189 Other diseases of stomach and duodenum: Secondary | ICD-10-CM | POA: Diagnosis not present

## 2024-01-13 DIAGNOSIS — I10 Essential (primary) hypertension: Secondary | ICD-10-CM | POA: Diagnosis not present

## 2024-01-13 DIAGNOSIS — D649 Anemia, unspecified: Secondary | ICD-10-CM | POA: Diagnosis not present

## 2024-01-13 DIAGNOSIS — G928 Other toxic encephalopathy: Secondary | ICD-10-CM | POA: Diagnosis not present

## 2024-01-13 DIAGNOSIS — N179 Acute kidney failure, unspecified: Secondary | ICD-10-CM | POA: Diagnosis present

## 2024-01-13 DIAGNOSIS — R109 Unspecified abdominal pain: Secondary | ICD-10-CM | POA: Diagnosis present

## 2024-01-13 DIAGNOSIS — I8510 Secondary esophageal varices without bleeding: Secondary | ICD-10-CM | POA: Diagnosis present

## 2024-01-13 DIAGNOSIS — I8500 Esophageal varices without bleeding: Secondary | ICD-10-CM | POA: Diagnosis not present

## 2024-01-13 DIAGNOSIS — K2950 Unspecified chronic gastritis without bleeding: Secondary | ICD-10-CM | POA: Diagnosis not present

## 2024-01-13 DIAGNOSIS — I4819 Other persistent atrial fibrillation: Secondary | ICD-10-CM | POA: Diagnosis present

## 2024-01-13 DIAGNOSIS — I480 Paroxysmal atrial fibrillation: Secondary | ICD-10-CM | POA: Diagnosis not present

## 2024-01-13 DIAGNOSIS — K921 Melena: Secondary | ICD-10-CM | POA: Diagnosis not present

## 2024-01-13 DIAGNOSIS — I361 Nonrheumatic tricuspid (valve) insufficiency: Secondary | ICD-10-CM

## 2024-01-13 DIAGNOSIS — I5082 Biventricular heart failure: Secondary | ICD-10-CM

## 2024-01-13 DIAGNOSIS — I509 Heart failure, unspecified: Secondary | ICD-10-CM | POA: Diagnosis not present

## 2024-01-13 DIAGNOSIS — N289 Disorder of kidney and ureter, unspecified: Secondary | ICD-10-CM | POA: Diagnosis not present

## 2024-01-13 DIAGNOSIS — I2510 Atherosclerotic heart disease of native coronary artery without angina pectoris: Secondary | ICD-10-CM | POA: Diagnosis not present

## 2024-01-13 DIAGNOSIS — D500 Iron deficiency anemia secondary to blood loss (chronic): Secondary | ICD-10-CM | POA: Diagnosis not present

## 2024-01-13 DIAGNOSIS — D61818 Other pancytopenia: Secondary | ICD-10-CM | POA: Diagnosis not present

## 2024-01-13 DIAGNOSIS — F319 Bipolar disorder, unspecified: Secondary | ICD-10-CM | POA: Diagnosis present

## 2024-01-13 DIAGNOSIS — R42 Dizziness and giddiness: Secondary | ICD-10-CM | POA: Diagnosis not present

## 2024-01-13 DIAGNOSIS — F1210 Cannabis abuse, uncomplicated: Secondary | ICD-10-CM | POA: Diagnosis present

## 2024-01-13 DIAGNOSIS — Z515 Encounter for palliative care: Secondary | ICD-10-CM | POA: Diagnosis not present

## 2024-01-13 DIAGNOSIS — I5020 Unspecified systolic (congestive) heart failure: Secondary | ICD-10-CM

## 2024-01-13 DIAGNOSIS — E871 Hypo-osmolality and hyponatremia: Secondary | ICD-10-CM | POA: Diagnosis present

## 2024-01-13 DIAGNOSIS — D509 Iron deficiency anemia, unspecified: Secondary | ICD-10-CM | POA: Diagnosis not present

## 2024-01-13 DIAGNOSIS — N1832 Chronic kidney disease, stage 3b: Secondary | ICD-10-CM | POA: Diagnosis present

## 2024-01-13 DIAGNOSIS — E785 Hyperlipidemia, unspecified: Secondary | ICD-10-CM | POA: Diagnosis present

## 2024-01-13 DIAGNOSIS — C859A Non-Hodgkin lymphoma, unspecified, in remission: Secondary | ICD-10-CM | POA: Diagnosis present

## 2024-01-13 DIAGNOSIS — I081 Rheumatic disorders of both mitral and tricuspid valves: Secondary | ICD-10-CM | POA: Diagnosis present

## 2024-01-13 DIAGNOSIS — R570 Cardiogenic shock: Secondary | ICD-10-CM | POA: Diagnosis not present

## 2024-01-13 DIAGNOSIS — I4892 Unspecified atrial flutter: Secondary | ICD-10-CM | POA: Diagnosis present

## 2024-01-13 DIAGNOSIS — Z01810 Encounter for preprocedural cardiovascular examination: Secondary | ICD-10-CM | POA: Diagnosis not present

## 2024-01-13 DIAGNOSIS — Z7189 Other specified counseling: Secondary | ICD-10-CM | POA: Diagnosis not present

## 2024-01-13 DIAGNOSIS — I452 Bifascicular block: Secondary | ICD-10-CM | POA: Diagnosis present

## 2024-01-13 DIAGNOSIS — K317 Polyp of stomach and duodenum: Secondary | ICD-10-CM | POA: Diagnosis present

## 2024-01-13 DIAGNOSIS — J4520 Mild intermittent asthma, uncomplicated: Secondary | ICD-10-CM | POA: Diagnosis present

## 2024-01-13 DIAGNOSIS — R0602 Shortness of breath: Secondary | ICD-10-CM | POA: Diagnosis not present

## 2024-01-13 DIAGNOSIS — K7460 Unspecified cirrhosis of liver: Secondary | ICD-10-CM | POA: Diagnosis present

## 2024-01-13 DIAGNOSIS — I517 Cardiomegaly: Secondary | ICD-10-CM | POA: Diagnosis not present

## 2024-01-13 DIAGNOSIS — D62 Acute posthemorrhagic anemia: Secondary | ICD-10-CM | POA: Diagnosis present

## 2024-01-13 DIAGNOSIS — I130 Hypertensive heart and chronic kidney disease with heart failure and stage 1 through stage 4 chronic kidney disease, or unspecified chronic kidney disease: Secondary | ICD-10-CM | POA: Diagnosis present

## 2024-01-13 DIAGNOSIS — I4891 Unspecified atrial fibrillation: Secondary | ICD-10-CM | POA: Diagnosis not present

## 2024-01-13 DIAGNOSIS — Z7901 Long term (current) use of anticoagulants: Secondary | ICD-10-CM | POA: Diagnosis not present

## 2024-01-13 DIAGNOSIS — I5023 Acute on chronic systolic (congestive) heart failure: Secondary | ICD-10-CM | POA: Diagnosis present

## 2024-01-13 DIAGNOSIS — E874 Mixed disorder of acid-base balance: Secondary | ICD-10-CM | POA: Diagnosis present

## 2024-01-13 DIAGNOSIS — K635 Polyp of colon: Secondary | ICD-10-CM | POA: Diagnosis not present

## 2024-01-13 DIAGNOSIS — K640 First degree hemorrhoids: Secondary | ICD-10-CM | POA: Diagnosis not present

## 2024-01-13 DIAGNOSIS — D122 Benign neoplasm of ascending colon: Secondary | ICD-10-CM | POA: Diagnosis not present

## 2024-01-13 DIAGNOSIS — R0682 Tachypnea, not elsewhere classified: Secondary | ICD-10-CM | POA: Diagnosis not present

## 2024-01-13 DIAGNOSIS — Z95810 Presence of automatic (implantable) cardiac defibrillator: Secondary | ICD-10-CM | POA: Diagnosis not present

## 2024-01-13 LAB — ECHOCARDIOGRAM COMPLETE
AR max vel: 1.9 cm2
AV Area VTI: 1.9 cm2
AV Area mean vel: 1.75 cm2
AV Mean grad: 4 mmHg
AV Peak grad: 8.2 mmHg
Ao pk vel: 1.43 m/s
Calc EF: 30 %
MV VTI: 1.44 cm2
S' Lateral: 5.4 cm
Single Plane A2C EF: 31.2 %
Single Plane A4C EF: 26.4 %

## 2024-01-13 LAB — CBC
HCT: 27.7 % — ABNORMAL LOW (ref 39.0–52.0)
Hemoglobin: 8.2 g/dL — ABNORMAL LOW (ref 13.0–17.0)
MCH: 20.3 pg — ABNORMAL LOW (ref 26.0–34.0)
MCHC: 29.6 g/dL — ABNORMAL LOW (ref 30.0–36.0)
MCV: 68.6 fL — ABNORMAL LOW (ref 80.0–100.0)
Platelets: 147 K/uL — ABNORMAL LOW (ref 150–400)
RBC: 4.04 MIL/uL — ABNORMAL LOW (ref 4.22–5.81)
RDW: 21.8 % — ABNORMAL HIGH (ref 11.5–15.5)
WBC: 5.1 K/uL (ref 4.0–10.5)
nRBC: 0 % (ref 0.0–0.2)

## 2024-01-13 LAB — BASIC METABOLIC PANEL WITH GFR
Anion gap: 11 (ref 5–15)
BUN: 22 mg/dL — ABNORMAL HIGH (ref 6–20)
CO2: 18 mmol/L — ABNORMAL LOW (ref 22–32)
Calcium: 9.2 mg/dL (ref 8.9–10.3)
Chloride: 107 mmol/L (ref 98–111)
Creatinine, Ser: 1.62 mg/dL — ABNORMAL HIGH (ref 0.61–1.24)
GFR, Estimated: 48 mL/min — ABNORMAL LOW (ref >60)
Glucose, Bld: 100 mg/dL — ABNORMAL HIGH (ref 70–99)
Potassium: 4 mmol/L (ref 3.5–5.1)
Sodium: 136 mmol/L (ref 135–145)

## 2024-01-13 LAB — CBC WITH DIFFERENTIAL/PLATELET
Abs Immature Granulocytes: 0.02 K/uL (ref 0.00–0.07)
Basophils Absolute: 0 K/uL (ref 0.0–0.1)
Basophils Relative: 1 %
Eosinophils Absolute: 0.1 K/uL (ref 0.0–0.5)
Eosinophils Relative: 2 %
HCT: 26.1 % — ABNORMAL LOW (ref 39.0–52.0)
Hemoglobin: 7.9 g/dL — ABNORMAL LOW (ref 13.0–17.0)
Immature Granulocytes: 0 %
Lymphocytes Relative: 20 %
Lymphs Abs: 1 K/uL (ref 0.7–4.0)
MCH: 20.4 pg — ABNORMAL LOW (ref 26.0–34.0)
MCHC: 30.3 g/dL (ref 30.0–36.0)
MCV: 67.4 fL — ABNORMAL LOW (ref 80.0–100.0)
Monocytes Absolute: 0.6 K/uL (ref 0.1–1.0)
Monocytes Relative: 12 %
Neutro Abs: 3.3 K/uL (ref 1.7–7.7)
Neutrophils Relative %: 65 %
Platelets: 159 K/uL (ref 150–400)
RBC: 3.87 MIL/uL — ABNORMAL LOW (ref 4.22–5.81)
RDW: 21.4 % — ABNORMAL HIGH (ref 11.5–15.5)
Smear Review: NORMAL
WBC: 5.1 K/uL (ref 4.0–10.5)
nRBC: 0 % (ref 0.0–0.2)

## 2024-01-13 LAB — URINALYSIS, ROUTINE W REFLEX MICROSCOPIC
Bacteria, UA: NONE SEEN
Bilirubin Urine: NEGATIVE
Glucose, UA: NEGATIVE mg/dL
Hgb urine dipstick: NEGATIVE
Ketones, ur: NEGATIVE mg/dL
Nitrite: NEGATIVE
Protein, ur: NEGATIVE mg/dL
Specific Gravity, Urine: 1.018 (ref 1.005–1.030)
pH: 7 (ref 5.0–8.0)

## 2024-01-13 LAB — BLOOD GAS, VENOUS
Acid-base deficit: 2.6 mmol/L — ABNORMAL HIGH (ref 0.0–2.0)
Bicarbonate: 20.2 mmol/L (ref 20.0–28.0)
O2 Saturation: 90.8 %
Patient temperature: 36.5
pCO2, Ven: 28 mmHg — ABNORMAL LOW (ref 44–60)
pH, Ven: 7.46 — ABNORMAL HIGH (ref 7.25–7.43)
pO2, Ven: 57 mmHg — ABNORMAL HIGH (ref 32–45)

## 2024-01-13 LAB — LACTIC ACID, PLASMA
Lactic Acid, Venous: 1.3 mmol/L (ref 0.5–1.9)
Lactic Acid, Venous: 1.3 mmol/L (ref 0.5–1.9)

## 2024-01-13 LAB — PROCALCITONIN: Procalcitonin: 0.45 ng/mL

## 2024-01-13 LAB — BRAIN NATRIURETIC PEPTIDE: B Natriuretic Peptide: 720.3 pg/mL — ABNORMAL HIGH (ref 0.0–100.0)

## 2024-01-13 LAB — HEMOGLOBIN AND HEMATOCRIT, BLOOD
HCT: 26.6 % — ABNORMAL LOW (ref 39.0–52.0)
Hemoglobin: 7.9 g/dL — ABNORMAL LOW (ref 13.0–17.0)

## 2024-01-13 MED ORDER — SUCRALFATE 1 GM/10ML PO SUSP
1.0000 g | Freq: Three times a day (TID) | ORAL | Status: DC
  Administered 2024-01-13 – 2024-01-21 (×31): 1 g via ORAL
  Filled 2024-01-13 (×32): qty 10

## 2024-01-13 MED ORDER — OXYCODONE HCL 5 MG PO TABS
5.0000 mg | ORAL_TABLET | Freq: Four times a day (QID) | ORAL | Status: DC | PRN
  Administered 2024-01-13 – 2024-01-20 (×5): 5 mg via ORAL
  Filled 2024-01-13 (×6): qty 1

## 2024-01-13 NOTE — Plan of Care (Signed)
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
## 2024-01-13 NOTE — Plan of Care (Signed)
   Problem: Coping: Goal: Level of anxiety will decrease Outcome: Progressing   Problem: Elimination: Goal: Will not experience complications related to bowel motility Outcome: Progressing Goal: Will not experience complications related to urinary retention Outcome: Progressing

## 2024-01-13 NOTE — Progress Notes (Signed)
 PROGRESS NOTE        PATIENT DETAILS Name: Cody Guzman Age: 58 y.o. Sex: male Date of Birth: 1966-01-11 Admit Date: 01/11/2024 Admitting Physician Redia LOISE Cleaver, MD ERE:[id number], Haze ORN, NP  Brief Summary: Patient is a 58 y.o.  male with history of HFrEF-s/p ICD placement, atrial fibrillation, CVA, CKD stage IIIb-who presented to the hospital with several days history of upper abdominal discomfort/melanotic stools.  Patient was found to have upper GI bleeding with acute blood loss anemia.  Significant events: 9/8>> admit to [facility]  Significant studies: 9/9>> CT abdomen/pelvis: No acute abnormalities.  Possible early cirrhosis. 9/10>> CXR: No PNA  Significant microbiology data: None  Procedures: None  Consults: GI Cardiology  Subjective: Lying comfortably in bed-denies any chest pain or shortness of breath.  No BM since hospitalization.  Objective: Vitals: Blood pressure 94/62, pulse (!) 103, temperature 97.9 F (36.6 C), temperature source Oral, resp. rate 19, SpO2 98%.   Exam: Gen Exam:Alert awake-not in any distress HEENT:atraumatic, normocephalic Chest: B/L clear to auscultation anteriorly CVS:S1S2 regular Abdomen:soft-slightly tender in the epigastric area but no peritoneal signs.   Extremities:no edema Neurology: Non focal Skin: no rash  Pertinent Labs/Radiology:    Latest Ref Rng & Units 01/13/2024    4:50 AM 01/13/2024   12:19 AM 01/12/2024    8:19 AM  CBC  WBC 4.0 - 10.5 K/uL 5.1     Hemoglobin 13.0 - 17.0 g/dL 7.9  7.9  8.5   Hematocrit 39.0 - 52.0 % 26.1  26.6  25.0   Platelets 150 - 400 K/uL 159       Lab Results  Component Value Date   NA 136 01/13/2024   K 4.0 01/13/2024   CL 107 01/13/2024   CO2 18 (L) 01/13/2024      Assessment/Plan: UGI bleed with ABLA Bleeding seems to have resolved-no BM since admission Hb stable overnight Given upper abdominal discomfort-could have peptic ulcer Continue  PPI Await GI recommendations. Follow CBC  Upper abdominal discomfort Given GI bleeding-could be peptic ulcer disease No significant abnormality seen on CT abdomen-lipase within normal limits-exam is without any peritoneal signs. Continue PPI Add Carafate .  Minimally elevated troponins Trend is flat-doubt of any clinical significance  Chronic HFrEF/nonischemic cardiomyopathy-s/p ICD implantation Euvolemic. Continue beta-blocker Continue to hold Jardiance /ARB/Aldactone /Demadex  for now. Cardiology consulted at [facility] request-prior to proceeding with endoscopic evaluation Echo pending.  PAF Rate controlled-continue amiodarone /metoprolol . Eliquis  on hold due to GI bleed.  History of CVA No obvious focal deficits Continue statin Resume Eliquis  when GI workup is complete.  CKD stage IIIb Close to baseline Note-AKI ruled out.  Possible early cirrhosis noted on CT Incidental finding Further workup deferred to the outpatient setting  History of Hodgkin's lymphoma Reportedly in remission Outpatient oncology follow-up  Bipolar disorder Stable Seroquel /Topamax .  Gout No flare Allopurinol   BPH Flomax   Code status:   Code Status: Full Code   DVT Prophylaxis: SCDs Start: 01/12/24 0052  Family Communication: None at bedside   Disposition Plan: Status is: Observation The patient will require care spanning > 2 midnights and should be moved to inpatient because: Severity of illness   Planned Discharge Destination:Home   Diet: Diet Order             Diet Heart Room service appropriate? Yes; Fluid consistency: Thin  Diet effective now  Antimicrobial agents: Anti-infectives (From admission, onward)    None        MEDICATIONS: Scheduled Meds:  allopurinol   100 mg Oral Daily   amiodarone   100 mg Oral Daily   atorvastatin   80 mg Oral Daily   metoprolol  succinate  50 mg Oral Daily   pantoprazole  (PROTONIX ) IV  40 mg Intravenous  Q12H   QUEtiapine   300 mg Oral QHS   tamsulosin   0.4 mg Oral Daily   topiramate   25 mg Oral Daily   Continuous Infusions: PRN Meds:.albuterol , oxyCODONE    I have personally reviewed following labs and imaging studies  LABORATORY DATA: CBC: Recent Labs  Lab 01/11/24 1731 01/11/24 1755 01/12/24 0036 01/12/24 0800 01/12/24 0819 01/13/24 0019 01/13/24 0450  WBC 6.2  --  4.7 4.7  --   --  5.1  NEUTROABS  --   --   --   --   --   --  3.3  HGB 8.6*   < > 8.8* 7.6* 8.5* 7.9* 7.9*  HCT 29.4*   < > 30.2* 26.1* 25.0* 26.6* 26.1*  MCV 68.5*  --  68.6* 70.4*  --   --  67.4*  PLT 186  --  170 158  --   --  159   < > = values in this interval not displayed.    Basic Metabolic Panel: Recent Labs  Lab 01/11/24 1731 01/11/24 1755 01/12/24 0800 01/12/24 0819 01/13/24 0450  NA 134* 137 133* 138 136  K 4.2 4.2 3.7 3.7 4.0  CL 103 104 105  --  107  CO2 16*  --  17*  --  18*  GLUCOSE 84 80 77  --  100*  BUN 24* 22* 23*  --  22*  CREATININE 1.76* 1.80* 1.59*  --  1.62*  CALCIUM  9.9  --  9.2  --  9.2    GFR: CrCl cannot be calculated (Unknown ideal weight.).  Liver Function Tests: Recent Labs  Lab 01/12/24 0036 01/12/24 0800  AST 77* 61*  ALT 56* 49*  ALKPHOS 121 104  BILITOT 5.5* 4.6*  PROT 6.8 6.0*  ALBUMIN 3.3* 2.8*   Recent Labs  Lab 01/12/24 0036  LIPASE 31   No results for input(s): AMMONIA in the last 168 hours.  Coagulation Profile: No results for input(s): INR, PROTIME in the last 168 hours.  Cardiac Enzymes: No results for input(s): CKTOTAL, CKMB, CKMBINDEX, TROPONINI in the last 168 hours.  BNP (last 3 results) No results for input(s): PROBNP in the last 8760 hours.  Lipid Profile: No results for input(s): CHOL, HDL, LDLCALC, TRIG, CHOLHDL, LDLDIRECT in the last 72 hours.  Thyroid  Function Tests: No results for input(s): TSH, T4TOTAL, FREET4, T3FREE, THYROIDAB in the last 72 hours.  Anemia Panel: Recent  Labs    01/12/24 1502  VITAMINB12 1,710*  FOLATE 10.8  FERRITIN 10*  TIBC 497*  IRON 16*  RETICCTPCT 2.1    Urine analysis:    Component Value Date/Time   COLORURINE AMBER (A) 01/11/2024 2152   APPEARANCEUR CLEAR 01/11/2024 2152   APPEARANCEUR Clear 08/20/2021 1423   LABSPEC 1.017 01/11/2024 2152   PHURINE 5.0 01/11/2024 2152   GLUCOSEU NEGATIVE 01/11/2024 2152   HGBUR NEGATIVE 01/11/2024 2152   BILIRUBINUR NEGATIVE 01/11/2024 2152   BILIRUBINUR Negative 08/20/2021 1423   KETONESUR NEGATIVE 01/11/2024 2152   PROTEINUR 100 (A) 01/11/2024 2152   UROBILINOGEN negative (A) 03/15/2018 1749   NITRITE NEGATIVE 01/11/2024 2152   LEUKOCYTESUR NEGATIVE 01/11/2024 2152  Sepsis Labs: Lactic Acid, Venous    Component Value Date/Time   LATICACIDVEN 1.3 01/13/2024 0814    MICROBIOLOGY: No results found for this or any previous visit (from the past 240 hours).  RADIOLOGY STUDIES/RESULTS: DG CHEST PORT 1 VIEW Result Date: 01/13/2024 EXAM: 1 VIEW XRAY OF THE CHEST 01/13/2024 12:41:23 AM COMPARISON: 12/28/2018 CLINICAL HISTORY: Tachypnea FINDINGS: LUNGS AND PLEURA: Low lung volumes accentuate interstitial markings. No focal pulmonary opacity. No pulmonary edema. No pneumothorax. Left chest wall ICD. HEART AND MEDIASTINUM: Cardiomegaly. BONES AND SOFT TISSUES: No acute osseous abnormality. IMPRESSION: 1. No acute findings. 2. Cardiomegaly. Electronically signed by: Norman Gatlin MD 01/13/2024 12:46 AM EDT RP Workstation: [id number]   CT ABDOMEN PELVIS WO CONTRAST Result Date: 01/12/2024 CLINICAL DATA:  Abdominal pain EXAM: CT ABDOMEN AND PELVIS WITHOUT CONTRAST TECHNIQUE: Multidetector CT imaging of the abdomen and pelvis was performed following the standard protocol without IV contrast. RADIATION DOSE REDUCTION: This exam was performed according to the departmental dose-optimization program which includes automated exposure control, adjustment of the mA and/or kV according to patient size  and/or use of iterative reconstruction technique. COMPARISON:  06/16/2023 FINDINGS: Lower chest: Pacer wires in the right heart, unchanged. Cardiomegaly. No acute findings. Hepatobiliary: Diffuse low-density throughout the liver compatible with fatty infiltration. No focal abnormality. Questionable subtle nodularity to the liver surface suggesting early cirrhosis. Gallbladder unremarkable. Pancreas: No focal abnormality or ductal dilatation. Spleen: No focal abnormality.  Normal size. Adrenals/Urinary Tract: No suspicious renal or adrenal abnormality. No stones or hydronephrosis. Urinary bladder unremarkable. Stomach/Bowel: Normal appendix. Stomach, large and small bowel grossly unremarkable. Vascular/Lymphatic: No evidence of aneurysm or adenopathy. Aortic atherosclerosis. Reproductive: No visible focal abnormality. Other: Small amount of free fluid in the abdomen and pelvis, new since prior study. Musculoskeletal: No acute bony abnormality. IMPRESSION: Cardiomegaly. Hepatic steatosis. Questionable subtle nodularity to the liver surface suggesting early cirrhosis. Recommend clinical correlation. Small amount of free fluid in the abdomen and pelvis, new since prior study. Aortic atherosclerosis. Electronically Signed   By: Franky Crease M.D.   On: 01/12/2024 01:49   DG Chest 2 View Result Date: 01/11/2024 CLINICAL DATA:  Chest pain. EXAM: CHEST - 2 VIEW COMPARISON:  June 05, 2022 FINDINGS: There is stable multi lead AICD positioning. The cardiac silhouette is enlarged and unchanged in size. Both lungs are clear. The visualized skeletal structures are unremarkable. IMPRESSION: Stable cardiomegaly without active cardiopulmonary disease. Electronically Signed   By: Suzen Dials M.D.   On: 01/11/2024 19:04     LOS: 0 days   Donalda Applebaum, MD  [facility]    To contact the attending provider between 7A-7P or the covering provider during after hours 7P-7A, please log into the web site  [email or web address] and access using universal [place] password for that web site. If you do not have the password, please call the hospital operator.  01/13/2024, 12:55 PM
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
## 2024-01-14 ENCOUNTER — Other Ambulatory Visit: Payer: Self-pay

## 2024-01-14 DIAGNOSIS — I2510 Atherosclerotic heart disease of native coronary artery without angina pectoris: Secondary | ICD-10-CM

## 2024-01-14 DIAGNOSIS — D500 Iron deficiency anemia secondary to blood loss (chronic): Secondary | ICD-10-CM

## 2024-01-14 DIAGNOSIS — I509 Heart failure, unspecified: Secondary | ICD-10-CM | POA: Diagnosis not present

## 2024-01-14 DIAGNOSIS — I4891 Unspecified atrial fibrillation: Secondary | ICD-10-CM | POA: Diagnosis not present

## 2024-01-14 DIAGNOSIS — I4819 Other persistent atrial fibrillation: Secondary | ICD-10-CM

## 2024-01-14 DIAGNOSIS — Z01810 Encounter for preprocedural cardiovascular examination: Secondary | ICD-10-CM | POA: Diagnosis not present

## 2024-01-14 DIAGNOSIS — D649 Anemia, unspecified: Secondary | ICD-10-CM | POA: Diagnosis not present

## 2024-01-14 DIAGNOSIS — K922 Gastrointestinal hemorrhage, unspecified: Secondary | ICD-10-CM | POA: Diagnosis not present

## 2024-01-14 DIAGNOSIS — R42 Dizziness and giddiness: Secondary | ICD-10-CM

## 2024-01-14 DIAGNOSIS — R0602 Shortness of breath: Secondary | ICD-10-CM | POA: Diagnosis not present

## 2024-01-14 DIAGNOSIS — I5082 Biventricular heart failure: Secondary | ICD-10-CM | POA: Diagnosis not present

## 2024-01-14 DIAGNOSIS — I5020 Unspecified systolic (congestive) heart failure: Secondary | ICD-10-CM | POA: Diagnosis not present

## 2024-01-14 DIAGNOSIS — N289 Disorder of kidney and ureter, unspecified: Secondary | ICD-10-CM | POA: Diagnosis not present

## 2024-01-14 LAB — CBC
HCT: 28 % — ABNORMAL LOW (ref 39.0–52.0)
Hemoglobin: 8.4 g/dL — ABNORMAL LOW (ref 13.0–17.0)
MCH: 20.4 pg — ABNORMAL LOW (ref 26.0–34.0)
MCHC: 30 g/dL (ref 30.0–36.0)
MCV: 68.1 fL — ABNORMAL LOW (ref 80.0–100.0)
Platelets: 148 K/uL — ABNORMAL LOW (ref 150–400)
RBC: 4.11 MIL/uL — ABNORMAL LOW (ref 4.22–5.81)
RDW: 22.1 % — ABNORMAL HIGH (ref 11.5–15.5)
WBC: 4.5 K/uL (ref 4.0–10.5)
nRBC: 0 % (ref 0.0–0.2)

## 2024-01-14 LAB — BASIC METABOLIC PANEL WITH GFR
Anion gap: 10 (ref 5–15)
BUN: 21 mg/dL — ABNORMAL HIGH (ref 6–20)
CO2: 17 mmol/L — ABNORMAL LOW (ref 22–32)
Calcium: 9.3 mg/dL (ref 8.9–10.3)
Chloride: 105 mmol/L (ref 98–111)
Creatinine, Ser: 1.66 mg/dL — ABNORMAL HIGH (ref 0.61–1.24)
GFR, Estimated: 47 mL/min — ABNORMAL LOW (ref >60)
Glucose, Bld: 97 mg/dL (ref 70–99)
Potassium: 4.6 mmol/L (ref 3.5–5.1)
Sodium: 132 mmol/L — ABNORMAL LOW (ref 135–145)

## 2024-01-14 LAB — AMMONIA: Ammonia: 59 umol/L — ABNORMAL HIGH (ref 9–35)

## 2024-01-14 LAB — URINALYSIS, ROUTINE W REFLEX MICROSCOPIC
Bacteria, UA: NONE SEEN
Bilirubin Urine: NEGATIVE
Glucose, UA: NEGATIVE mg/dL
Hgb urine dipstick: NEGATIVE
Ketones, ur: NEGATIVE mg/dL
Leukocytes,Ua: NEGATIVE
Nitrite: NEGATIVE
Protein, ur: 30 mg/dL — AB
Specific Gravity, Urine: 1.017 (ref 1.005–1.030)
pH: 6 (ref 5.0–8.0)

## 2024-01-14 LAB — LACTIC ACID, PLASMA
Lactic Acid, Venous: 2.4 mmol/L (ref 0.5–1.9)
Lactic Acid, Venous: 2.1 mmol/L (ref 0.5–1.9)

## 2024-01-14 MED ORDER — METOPROLOL SUCCINATE ER 25 MG PO TB24
25.0000 mg | ORAL_TABLET | Freq: Every day | ORAL | Status: DC
Start: 2024-01-15 — End: 2024-01-16
  Administered 2024-01-15: 25 mg via ORAL
  Filled 2024-01-14 (×2): qty 1

## 2024-01-14 MED ORDER — FUROSEMIDE 10 MG/ML IJ SOLN
40.0000 mg | Freq: Once | INTRAMUSCULAR | Status: AC
Start: 2024-01-14 — End: 2024-01-14
  Administered 2024-01-14: 40 mg via INTRAVENOUS
  Filled 2024-01-14: qty 4

## 2024-01-14 MED ORDER — MIDODRINE HCL 5 MG PO TABS
5.0000 mg | ORAL_TABLET | Freq: Three times a day (TID) | ORAL | Status: DC
Start: 2024-01-14 — End: 2024-01-21
  Administered 2024-01-14 – 2024-01-21 (×20): 5 mg via ORAL
  Filled 2024-01-14 (×20): qty 1
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
## 2024-01-14 NOTE — Progress Notes (Addendum)
 Patient ID: Cody Guzman, male   DOB: 07/27/1965, 58 y.o.   MRN: [id number]    Progress Note   Subjective   Day # 3 CC;anemia, drop in hgb /heme negative stool in setting of Eliquis  in pt with severe heart failure  Eliquis  on hold  Pressure 78/67 earlier this morning, 90/50 currently pulse in the 70s intermittently elevated respirations, O2 sat 92 on room air  Patient says he feels very bad, denies any chest pain, says he is feeling short of breath like he cannot get a breath, and has been dizzy for 3 days.  He says he cannot sit up, no appetite.  2d echo-EF 25 to 30%, left ventricle with severe global hypokinesis ICD interrogation-did not reveal any ventricular arrhythmia  Labs today-WBC 4.5/hemoglobin 8.4/hematocrit 28.0/MCV 68-stable since admission Sodium 132/potassium 4.6/BUN 21/creatinine 1.66 BNP yesterday 720 Serum iron 16/TIBC 497/iron sat 3/ferritin 10   Objective   Vital signs in last 24 hours: Temp:  [97.5 F (36.4 C)-98.2 F (36.8 C)] 98.2 F (36.8 C) (09/11 1150) Pulse Rate:  [74-95] 74 (09/11 1150) Resp:  [17-33] 30 (09/11 1150) BP: (89-93)/(58-73) 89/72 (09/11 1150) SpO2:  [88 %-100 %] 99 % (09/11 1150) Last BM Date : 01/10/24 General:    Older African-American male lying in bed, somnolent, opens his eyes arouses and can have conversation a few keep stimulating him.  Says he feels very dizzy and short of breath Heart:  irRegular rate and rhythm; no murmurs Lungs: Respirations even , increased respiratory rate, no obvious Rales/crackles Abdomen:  Soft, nontender and nondistended. Normal bowel sounds. Extremities:  Without edema. Neurologic: Lethargic but arousable Psych:  Cooperative. Normal mood and affect.  Intake/Output from previous day: 09/10 0701 - 09/11 0700 In: -  Out: 375 [Urine:375] Intake/Output this shift: No intake/output data recorded.  Lab Results: Recent Labs    01/13/24 0450 01/13/24 1748 01/14/24 0558  WBC 5.1 5.1 4.5  HGB  7.9* 8.2* 8.4*  HCT 26.1* 27.7* 28.0*  PLT 159 147* 148*   BMET Recent Labs    01/12/24 0800 01/12/24 0819 01/13/24 0450 01/14/24 0558  NA 133* 138 136 132*  K 3.7 3.7 4.0 4.6  CL 105  --  107 105  CO2 17*  --  18* 17*  GLUCOSE 77  --  100* 97  BUN 23*  --  22* 21*  CREATININE 1.59*  --  1.62* 1.66*  CALCIUM  9.2  --  9.2 9.3   LFT Recent Labs    01/12/24 0036 01/12/24 0800  PROT 6.8 6.0*  ALBUMIN 3.3* 2.8*  AST 77* 61*  ALT 56* 49*  ALKPHOS 121 104  BILITOT 5.5* 4.6*  BILIDIR 1.9*  --   IBILI 3.6*  --    PT/INR No results for input(s): LABPROT, INR in the last 72 hours.  Studies/Results: ECHOCARDIOGRAM COMPLETE Result Date: 01/13/2024    ECHOCARDIOGRAM REPORT   Patient Name:   [name] Date of Exam: 01/13/2024 Medical Rec #:  [id number]           Height:       68.0 in Accession #:    [id number]          Weight:       192.0 lb Date of Birth:  November 14, 1965            BSA:          2.009 m Patient Age:    58 years  BP:           88/70 mmHg Patient Gender: M                   HR:           75 bpm. Exam Location:  Inpatient Procedure: 2D Echo, Cardiac Doppler and Color Doppler (Both Spectral and Color            Flow Doppler were utilized during procedure). Indications:    Nonischemic Cardiomyopathy I42.8  History:        Patient has prior history of Echocardiogram examinations, most                 recent 08/27/2020. CHF and Cardiomegaly, Arrythmias:RBBB, Atrial                 Fibrillation and Atrial Flutter, Signs/Symptoms:Chest Pain,                 Syncope and Shortness of Breath; Risk Factors:Hypertension.  Sonographer:    BERNARDA ROCKS Referring Phys: AMY S ESTERWOOD IMPRESSIONS  1. Left ventricular ejection fraction, by estimation, is 25 to 30%. Left ventricular ejection fraction by 2D MOD biplane is 30.0 %. The left ventricle has severely decreased function. The left ventricle demonstrates global hypokinesis. The left ventricular internal cavity size was  moderately dilated. Left ventricular diastolic function could not be evaluated.  2. Right ventricular systolic function is moderately reduced. The right ventricular size is normal. A pacemaker lead noted in the mid-intraventricular septum is visualized. Tricuspid regurgitation signal is inadequate for assessing PA pressure. The estimated right ventricular systolic pressure is 29.0 mmHg.  3. Left atrial size was severely dilated.  4. Right atrial size was severely dilated.  5. There is no evidence of cardiac tamponade.  6. The mitral valve is abnormal. Moderate mitral valve regurgitation.  7. The tricuspid valve is abnormal. Tricuspid valve regurgitation is moderate to severe.  8. The aortic valve is tricuspid. Aortic valve regurgitation is not visualized.  9. The inferior vena cava is dilated in size with <50% respiratory variability, suggesting right atrial pressure of 15 mmHg. Comparison(s): Changes from prior study are noted. 08/27/2020: LVEF <20%. FINDINGS  Left Ventricle: Left ventricular ejection fraction, by estimation, is 25 to 30%. Left ventricular ejection fraction by 2D MOD biplane is 30.0 %. The left ventricle has severely decreased function. The left ventricle demonstrates global hypokinesis. The left ventricular internal cavity size was moderately dilated. There is no left ventricular hypertrophy. Left ventricular diastolic function could not be evaluated due to paced rhythm. Left ventricular diastolic function could not be evaluated. Right Ventricle: The right ventricular size is normal. No increase in right ventricular wall thickness. Right ventricular systolic function is moderately reduced. Tricuspid regurgitation signal is inadequate for assessing PA pressure. The tricuspid regurgitant velocity is 1.87 m/s, and with an assumed right atrial pressure of 15 mmHg, the estimated right ventricular systolic pressure is 29.0 mmHg. Left Atrium: Left atrial size was severely dilated. Right Atrium: Right  atrial size was severely dilated. Pericardium: Trivial pericardial effusion is present. The pericardial effusion is circumferential. There is no evidence of cardiac tamponade. Mitral Valve: The mitral valve is abnormal. There is moderate thickening of the anterior and posterior mitral valve leaflet(s). Moderate mitral valve regurgitation, with eccentric posteriorly directed jet. MV peak gradient, 6.4 mmHg. The mean mitral valve gradient is 3.0 mmHg. Tricuspid Valve: The tricuspid valve is abnormal. Tricuspid valve regurgitation is moderate to severe. Aortic Valve: The aortic  valve is tricuspid. Aortic valve regurgitation is not visualized. Aortic valve mean gradient measures 4.0 mmHg. Aortic valve peak gradient measures 8.2 mmHg. Aortic valve area, by VTI measures 1.90 cm. Pulmonic Valve: The pulmonic valve was grossly normal. Pulmonic valve regurgitation is trivial. Aorta: The aortic root and ascending aorta are structurally normal, with no evidence of dilitation. Venous: The inferior vena cava is dilated in size with less than 50% respiratory variability, suggesting right atrial pressure of 15 mmHg. IAS/Shunts: No atrial level shunt detected by color flow Doppler. Additional Comments: A pacemaker lead noted in the mid-intraventricular septum is visualized.  LEFT VENTRICLE PLAX 2D                        Biplane EF (MOD) LVIDd:         6.20 cm         LV Biplane EF:   Left LVIDs:         5.40 cm                          ventricular LV PW:         0.70 cm                          ejection LV IVS:        0.60 cm                          fraction by LVOT diam:     1.80 cm                          2D MOD LV SV:         40                               biplane is LV SV Index:   20                               30.0 %. LVOT Area:     2.54 cm  LV Volumes (MOD) LV vol d, MOD    413.0 ml A2C: LV vol d, MOD    288.0 ml A4C: LV vol s, MOD    284.0 ml A2C: LV vol s, MOD    212.0 ml A4C: LV SV MOD A2C:   129.0 ml LV SV MOD  A4C:   288.0 ml LV SV MOD BP:    104.8 ml RIGHT VENTRICLE            IVC RV S prime:     9.25 cm/s  IVC diam: 4.10 cm RVOT diam:      3.90 cm TAPSE (M-mode): 1.5 cm LEFT ATRIUM              Index        RIGHT ATRIUM           Index LA diam:        5.30 cm  2.64 cm/m   RA Area:     37.90 cm LA Vol (A2C):   150.0 ml 74.68 ml/m  RA Volume:   160.00 ml 79.66 ml/m LA Vol (A4C):   86.0 ml  42.82 ml/m LA Biplane Vol: 116.0 ml  57.75 ml/m  AORTIC VALVE                    PULMONIC VALVE AV Area (Vmax):    1.90 cm     PV Vmax:       0.82 m/s AV Area (Vmean):   1.75 cm     PV Peak grad:  2.7 mmHg AV Area (VTI):     1.90 cm AV Vmax:           143.00 cm/s AV Vmean:          87.500 cm/s AV VTI:            0.212 m AV Peak Grad:      8.2 mmHg AV Mean Grad:      4.0 mmHg LVOT Vmax:         107.00 cm/s LVOT Vmean:        60.100 cm/s LVOT VTI:          0.158 m LVOT/AV VTI ratio: 0.75  AORTA Ao Root diam: 2.80 cm Ao Asc diam:  3.30 cm MITRAL VALVE             TRICUSPID VALVE MV Area VTI:  1.44 cm   TR Peak grad:   14.0 mmHg MV Peak grad: 6.4 mmHg   TR Vmax:        187.00 cm/s MV Mean grad: 3.0 mmHg MV Vmax:      1.26 m/s   SHUNTS MV Vmean:     71.9 cm/s  Systemic VTI:  0.16 m                          Systemic Diam: 1.80 cm                          Pulmonic Diam: 3.90 cm Vinie Maxcy MD Electronically signed by Vinie Maxcy MD Signature Date/Time: 01/13/2024/5:37:04 PM    Final    DG CHEST PORT 1 VIEW Result Date: 01/13/2024 EXAM: 1 VIEW XRAY OF THE CHEST 01/13/2024 12:41:23 AM COMPARISON: 12/28/2018 CLINICAL HISTORY: Tachypnea FINDINGS: LUNGS AND PLEURA: Low lung volumes accentuate interstitial markings. No focal pulmonary opacity. No pulmonary edema. No pneumothorax. Left chest wall ICD. HEART AND MEDIASTINUM: Cardiomegaly. BONES AND SOFT TISSUES: No acute osseous abnormality. IMPRESSION: 1. No acute findings. 2. Cardiomegaly. Electronically signed by: Norman Gatlin MD 01/13/2024 12:46 AM EDT RP Workstation: [id number]        Assessment / Plan:    #26 58 year old African male, admitted through the ER 3 days ago after he was brought in by EMS after a syncopal episode while he was sitting in a class.  Had related that he had been having chest pain off and on for 5 days prior to that time Patient does not think his ICD fired, he was found to be in A-fib by EMS had improvement in symptoms with nitroglycerin  in ER.  Patient with known history of severe nonischemic cardiomyopathy with previous EF documented at 20 to 25% 2023, status post biventricular ICD, history of atrial fibs on chronic Eliquis  and right bundle branch block, chronic kidney disease stage III and prior history of CVA.  Patient found to be anemic on admission with hemoglobin 8.6/MCV 68 Documented Hemoccult negative  Hemoglobin was down 4-1/2 g since February. Patient did have report of dark stools for a period of days within the past couple of weeks.  Hemoglobin has remained  stable since admission, he has not required transfusion.  He is iron deficient  Repeat echo this admission with EF 20 to 25% Device interrogation did not reveal any recent ventricular arrhythmias. Lactate was elevated on admit at 2.8 BNP was elevated at 720  Cardiology cleared for GI evaluation, however patient continues to feel very poorly, blood pressure right low earlier this morning and baseline very soft He complains of of ongoing dizziness and shortness of breath, nurse reports that he complained of chest pain yesterday but has not complained of chest pain today, lethargic, not eating much  Remain concerned that his severe heart failure is driving his current symptoms.  CT of the chest abdomen pelvis without contrast on admission consistent with diffuse fatty infiltration question subtle nodularity, no other significant intra-abdominal findings  LFTs mildly elevated on admit and T. bili 5.5, yesterday T. bili 4.6/AST 61/ALT 49 Denies EtOH use, question cardiac  cirrhosis  Etiology of the anemia/iron deficiency is not clear, will need eventual endoscopic evaluation with EGD and colonoscopy if he can tolerate.  #2 chronic anticoagulation-on Eliquis  on hold since admission #3  Bipolar disorder #4 prior history of non-Hodgkin's lymphoma  Plan; advance to heart healthy diet Patient is not currently stable for endoscopic evaluation and as he is not actively bleeding and has not been bleeding since admission, would not want to put him through sedation if it is possible for him to be further optimized from a cardiopulmonary standpoint. Continue twice daily PPI Reinvolve cardiology  GI will continue to follow along      Principal Problem:   Acute GI bleeding Active Problems:   Cardiomyopathy (HCC)   History of non-Hodgkin's lymphoma   HLD (hyperlipidemia)   Abdominal pain   Essential hypertension   Bipolar 1 disorder (HCC)   Gout   OSA (obstructive sleep apnea)   Atrial fibrillation with rapid ventricular response (HCC)   Acute renal failure superimposed on stage 3a chronic kidney disease (HCC)     LOS: 1 day   Amy Esterwood PA-C 01/14/2024, 1:12 PM   Attending physician's note   I personally saw the patient and performed a substantive portion of the medical decision making process for this encounter (including a complete performance of the key components : MDM, Hx and Exam), in conjunction with the APP.  I agree with the APP's note, impression, and  the management plan for the number and complexity of problems addressed at the encounter for the patient and take responsibility for that plan with its inherent risk of complications, morbidity, or mortality with additional input as follows.      Complains of worsening shortness of breath, feeling dizzy, denies chest pain but feels worse compared to last few days  Hemoglobin stable, denies any bowel movement or melena  On exam bilateral decreased breath sounds, irregular heart rate,  tachycardic, no murmur Abdomen soft no tenderness or distention  58 year old male with history of A-fib, AICD, heart failure status post syncopal episode with worsening shortness of breath concerning for acute on chronic heart failure Will defer anemia and iron deficiency workup until cardiac status is optimized  Monitor hemoglobin and transfuse if below 7 Continue PPI Continue diet as tolerated  High complex medical decision making (chart review, review of results, face-to-face time used for counseling as well as treatment plan and follow-up. The patient was provided an opportunity to ask questions and all were answered. The patient agreed with the plan and demonstrated an understanding of the instructions.   K.  Veena Barrett Holthaus , MD [phone]

## 2024-01-14 NOTE — Plan of Care (Signed)
  Problem: Education: Goal: Knowledge of General Education information will improve Description: Including pain rating scale, medication(s)/side effects and non-pharmacologic comfort measures Outcome: Progressing   Problem: Health Behavior/Discharge Planning: Goal: Ability to manage health-related needs will improve Outcome: Progressing   Problem: Clinical Measurements: Goal: Will remain free from infection Outcome: Progressing Goal: Diagnostic test results will improve Outcome: Progressing Goal: Respiratory complications will improve Outcome: Progressing Goal: Cardiovascular complication will be avoided Outcome: Progressing   Problem: Activity: Goal: Risk for activity intolerance will decrease Outcome: Progressing   Problem: Coping: Goal: Level of anxiety will decrease Outcome: Progressing   Problem: Safety: Goal: Ability to remain free from injury will improve Outcome: Progressing   Problem: Skin Integrity: Goal: Risk for impaired skin integrity will decrease Outcome: Progressing

## 2024-01-14 NOTE — Progress Notes (Signed)
 Heart Failure Navigator Progress Note  Assessed for Heart & Vascular TOC clinic readiness.  Patient does not meet criteria due to Advanced Heart Failure Team patient of Dr. Gala Romney. .   Navigator will sign off at this time.   Rhae Hammock, BSN, Scientist, clinical (histocompatibility and immunogenetics) Only

## 2024-01-14 NOTE — Progress Notes (Signed)
 RT at bedside to assess patient for breathing treatment. Breathe sounds are clear. Sats are 100% on room air. Patient states he doesn't feel like he needed neb at this time.

## 2024-01-14 NOTE — Plan of Care (Signed)

## 2024-01-14 NOTE — Progress Notes (Signed)
 PROGRESS NOTE        PATIENT DETAILS Name: Cody Guzman Age: 58 y.o. Sex: male Date of Birth: 1966/02/22 Admit Date: 01/11/2024 Admitting Physician Redia LOISE Cleaver, MD ERE:[id number], Haze ORN, NP  Brief Summary: Patient is a 58 y.o.  male with history of HFrEF-s/p ICD placement, atrial fibrillation, CVA, CKD stage IIIb-who presented to the hospital with several days history of upper abdominal discomfort/melanotic stools.  Patient was found to have upper GI bleeding with acute blood loss anemia.  Significant events: 9/8>> admit to [facility]  Significant studies: 9/9>> CT abdomen/pelvis: No acute abnormalities.  Possible early cirrhosis. 9/10>> CXR: No PNA 9/10>> echo: EF 25-30%  Significant microbiology data: None  Procedures: None  Consults: GI Cardiology  Subjective: Complains of some mild shortness of breath but lungs are clear.  No BM since admission.  Intermittently continues to complain of epigastric pain.  Objective: Vitals: Blood pressure 93/73, pulse 95, temperature 98.2 F (36.8 C), temperature source Oral, resp. rate (!) 26, SpO2 96%.   Exam: Awake/alert Chest: Clear to auscultation CVS: S1-S2 regular Abdomen: Soft nontender-nondistended Extremities: Trace edema.  Pertinent Labs/Radiology:    Latest Ref Rng & Units 01/14/2024    5:58 AM 01/13/2024    5:48 PM 01/13/2024    4:50 AM  CBC  WBC 4.0 - 10.5 K/uL 4.5  5.1  5.1   Hemoglobin 13.0 - 17.0 g/dL 8.4  8.2  7.9   Hematocrit 39.0 - 52.0 % 28.0  27.7  26.1   Platelets 150 - 400 K/uL 148  147  159     Lab Results  Component Value Date   NA 136 01/13/2024   K 4.0 01/13/2024   CL 107 01/13/2024   CO2 18 (L) 01/13/2024      Assessment/Plan: UGI bleed with ABLA Bleeding seems to have resolved-no BM since admission-Hb stable Suspicion for peptic ulcer-given upper abdominal discomfort Continue PPI Await GI recommendations. Continue to follow CBC.  Upper abdominal  discomfort Given GI bleeding-could be peptic ulcer disease No significant abnormality seen on CT abdomen-lipase within normal limits-exam is without any peritoneal signs. Continue PPI/Carafate .  Minimally elevated troponins Trend is flat-doubt of any clinical significance  Chronic HFrEF/nonischemic cardiomyopathy-s/p ICD implantation Euvolemic. Continue beta-blocker Appreciate cardiology input Some mild shortness of breath but appears stable-Will give 1 dose of IV Lasix  today Continue to hold Jardiance /ARB/Aldactone /Demadex  for now. Cardiology following.  PAF Rate controlled-continue amiodarone /metoprolol . Eliquis  on hold due to GI bleed.  History of CVA No obvious focal deficits Continue statin Resume Eliquis  when GI workup is complete.  CKD stage IIIb Close to baseline Note-AKI ruled out.  Possible early cirrhosis noted on CT Incidental finding Further workup deferred to the outpatient setting  History of Hodgkin's lymphoma Reportedly in remission Outpatient oncology follow-up  Bipolar disorder Stable Seroquel /Topamax .  Gout No flare Allopurinol   BPH Flomax   Code status:   Code Status: Full Code   DVT Prophylaxis: SCDs Start: 01/12/24 0052  Family Communication: None at bedside   Disposition Plan: Status is: Observation The patient will require care spanning > 2 midnights and should be moved to inpatient because: Severity of illness   Planned Discharge Destination:Home   Diet: Diet Order             Diet clear liquid Room service appropriate? Yes; Fluid consistency: Thin  Diet effective 0500 tomorrow  Antimicrobial agents: Anti-infectives (From admission, onward)    None        MEDICATIONS: Scheduled Meds:  allopurinol   100 mg Oral Daily   amiodarone   100 mg Oral Daily   atorvastatin   80 mg Oral Daily   furosemide   40 mg Intravenous Once   metoprolol  succinate  50 mg Oral Daily   pantoprazole  (PROTONIX )  IV  40 mg Intravenous Q12H   QUEtiapine   300 mg Oral QHS   sucralfate   1 g Oral TID WC & HS   tamsulosin   0.4 mg Oral Daily   topiramate   25 mg Oral Daily   Continuous Infusions: PRN Meds:.albuterol , oxyCODONE    I have personally reviewed following labs and imaging studies  LABORATORY DATA: CBC: Recent Labs  Lab 01/12/24 0036 01/12/24 0800 01/12/24 0819 01/13/24 0019 01/13/24 0450 01/13/24 1748 01/14/24 0558  WBC 4.7 4.7  --   --  5.1 5.1 4.5  NEUTROABS  --   --   --   --  3.3  --   --   HGB 8.8* 7.6* 8.5* 7.9* 7.9* 8.2* 8.4*  HCT 30.2* 26.1* 25.0* 26.6* 26.1* 27.7* 28.0*  MCV 68.6* 70.4*  --   --  67.4* 68.6* 68.1*  PLT 170 158  --   --  159 147* 148*    Basic Metabolic Panel: Recent Labs  Lab 01/11/24 1731 01/11/24 1755 01/12/24 0800 01/12/24 0819 01/13/24 0450  NA 134* 137 133* 138 136  K 4.2 4.2 3.7 3.7 4.0  CL 103 104 105  --  107  CO2 16*  --  17*  --  18*  GLUCOSE 84 80 77  --  100*  BUN 24* 22* 23*  --  22*  CREATININE 1.76* 1.80* 1.59*  --  1.62*  CALCIUM  9.9  --  9.2  --  9.2    GFR: CrCl cannot be calculated (Unknown ideal weight.).  Liver Function Tests: Recent Labs  Lab 01/12/24 0036 01/12/24 0800  AST 77* 61*  ALT 56* 49*  ALKPHOS 121 104  BILITOT 5.5* 4.6*  PROT 6.8 6.0*  ALBUMIN 3.3* 2.8*   Recent Labs  Lab 01/12/24 0036  LIPASE 31   No results for input(s): AMMONIA in the last 168 hours.  Coagulation Profile: No results for input(s): INR, PROTIME in the last 168 hours.  Cardiac Enzymes: No results for input(s): CKTOTAL, CKMB, CKMBINDEX, TROPONINI in the last 168 hours.  BNP (last 3 results) No results for input(s): PROBNP in the last 8760 hours.  Lipid Profile: No results for input(s): CHOL, HDL, LDLCALC, TRIG, CHOLHDL, LDLDIRECT in the last 72 hours.  Thyroid  Function Tests: No results for input(s): TSH, T4TOTAL, FREET4, T3FREE, THYROIDAB in the last 72 hours.  Anemia  Panel: Recent Labs    01/12/24 1502  VITAMINB12 1,710*  FOLATE 10.8  FERRITIN 10*  TIBC 497*  IRON 16*  RETICCTPCT 2.1    Urine analysis:    Component Value Date/Time   COLORURINE AMBER (A) 01/13/2024 1655   APPEARANCEUR CLEAR 01/13/2024 1655   APPEARANCEUR Clear 08/20/2021 1423   LABSPEC 1.018 01/13/2024 1655   PHURINE 7.0 01/13/2024 1655   GLUCOSEU NEGATIVE 01/13/2024 1655   HGBUR NEGATIVE 01/13/2024 1655   BILIRUBINUR NEGATIVE 01/13/2024 1655   BILIRUBINUR Negative 08/20/2021 1423   KETONESUR NEGATIVE 01/13/2024 1655   PROTEINUR NEGATIVE 01/13/2024 1655   UROBILINOGEN negative (A) 03/15/2018 1749   NITRITE NEGATIVE 01/13/2024 1655   LEUKOCYTESUR TRACE (A) 01/13/2024 1655    Sepsis Labs: Lactic  Acid, Venous    Component Value Date/Time   LATICACIDVEN 1.3 01/13/2024 0814    MICROBIOLOGY: No results found for this or any previous visit (from the past 240 hours).  RADIOLOGY STUDIES/RESULTS: ECHOCARDIOGRAM COMPLETE Result Date: 01/13/2024    ECHOCARDIOGRAM REPORT   Patient Name:   Mordecai Akerele-Ale Date of Exam: 01/13/2024 Medical Rec #:  [id number]           Height:       68.0 in Accession #:    [id number]          Weight:       192.0 lb Date of Birth:  1966-04-04            BSA:          2.009 m Patient Age:    58 years            BP:           88/70 mmHg Patient Gender: M                   HR:           75 bpm. Exam Location:  Inpatient Procedure: 2D Echo, Cardiac Doppler and Color Doppler (Both Spectral and Color            Flow Doppler were utilized during procedure). Indications:    Nonischemic Cardiomyopathy I42.8  History:        Patient has prior history of Echocardiogram examinations, most                 recent 08/27/2020. CHF and Cardiomegaly, Arrythmias:RBBB, Atrial                 Fibrillation and Atrial Flutter, Signs/Symptoms:Chest Pain,                 Syncope and Shortness of Breath; Risk Factors:Hypertension.  Sonographer:    BERNARDA ROCKS Referring Phys: AMY  S ESTERWOOD IMPRESSIONS  1. Left ventricular ejection fraction, by estimation, is 25 to 30%. Left ventricular ejection fraction by 2D MOD biplane is 30.0 %. The left ventricle has severely decreased function. The left ventricle demonstrates global hypokinesis. The left ventricular internal cavity size was moderately dilated. Left ventricular diastolic function could not be evaluated.  2. Right ventricular systolic function is moderately reduced. The right ventricular size is normal. A pacemaker lead noted in the mid-intraventricular septum is visualized. Tricuspid regurgitation signal is inadequate for assessing PA pressure. The estimated right ventricular systolic pressure is 29.0 mmHg.  3. Left atrial size was severely dilated.  4. Right atrial size was severely dilated.  5. There is no evidence of cardiac tamponade.  6. The mitral valve is abnormal. Moderate mitral valve regurgitation.  7. The tricuspid valve is abnormal. Tricuspid valve regurgitation is moderate to severe.  8. The aortic valve is tricuspid. Aortic valve regurgitation is not visualized.  9. The inferior vena cava is dilated in size with <50% respiratory variability, suggesting right atrial pressure of 15 mmHg. Comparison(s): Changes from prior study are noted. 08/27/2020: LVEF <20%. FINDINGS  Left Ventricle: Left ventricular ejection fraction, by estimation, is 25 to 30%. Left ventricular ejection fraction by 2D MOD biplane is 30.0 %. The left ventricle has severely decreased function. The left ventricle demonstrates global hypokinesis. The left ventricular internal cavity size was moderately dilated. There is no left ventricular hypertrophy. Left ventricular diastolic function could not be evaluated due to paced rhythm. Left ventricular diastolic function could not be evaluated.  Right Ventricle: The right ventricular size is normal. No increase in right ventricular wall thickness. Right ventricular systolic function is moderately reduced.  Tricuspid regurgitation signal is inadequate for assessing PA pressure. The tricuspid regurgitant velocity is 1.87 m/s, and with an assumed right atrial pressure of 15 mmHg, the estimated right ventricular systolic pressure is 29.0 mmHg. Left Atrium: Left atrial size was severely dilated. Right Atrium: Right atrial size was severely dilated. Pericardium: Trivial pericardial effusion is present. The pericardial effusion is circumferential. There is no evidence of cardiac tamponade. Mitral Valve: The mitral valve is abnormal. There is moderate thickening of the anterior and posterior mitral valve leaflet(s). Moderate mitral valve regurgitation, with eccentric posteriorly directed jet. MV peak gradient, 6.4 mmHg. The mean mitral valve gradient is 3.0 mmHg. Tricuspid Valve: The tricuspid valve is abnormal. Tricuspid valve regurgitation is moderate to severe. Aortic Valve: The aortic valve is tricuspid. Aortic valve regurgitation is not visualized. Aortic valve mean gradient measures 4.0 mmHg. Aortic valve peak gradient measures 8.2 mmHg. Aortic valve area, by VTI measures 1.90 cm. Pulmonic Valve: The pulmonic valve was grossly normal. Pulmonic valve regurgitation is trivial. Aorta: The aortic root and ascending aorta are structurally normal, with no evidence of dilitation. Venous: The inferior vena cava is dilated in size with less than 50% respiratory variability, suggesting right atrial pressure of 15 mmHg. IAS/Shunts: No atrial level shunt detected by color flow Doppler. Additional Comments: A pacemaker lead noted in the mid-intraventricular septum is visualized.  LEFT VENTRICLE PLAX 2D                        Biplane EF (MOD) LVIDd:         6.20 cm         LV Biplane EF:   Left LVIDs:         5.40 cm                          ventricular LV PW:         0.70 cm                          ejection LV IVS:        0.60 cm                          fraction by LVOT diam:     1.80 cm                          2D MOD LV SV:          40                               biplane is LV SV Index:   20                               30.0 %. LVOT Area:     2.54 cm  LV Volumes (MOD) LV vol d, MOD    413.0 ml A2C: LV vol d, MOD    288.0 ml A4C: LV vol s, MOD    284.0 ml A2C: LV vol s, MOD    212.0 ml A4C: LV SV MOD A2C:   129.0 ml  LV SV MOD A4C:   288.0 ml LV SV MOD BP:    104.8 ml RIGHT VENTRICLE            IVC RV S prime:     9.25 cm/s  IVC diam: 4.10 cm RVOT diam:      3.90 cm TAPSE (M-mode): 1.5 cm LEFT ATRIUM              Index        RIGHT ATRIUM           Index LA diam:        5.30 cm  2.64 cm/m   RA Area:     37.90 cm LA Vol (A2C):   150.0 ml 74.68 ml/m  RA Volume:   160.00 ml 79.66 ml/m LA Vol (A4C):   86.0 ml  42.82 ml/m LA Biplane Vol: 116.0 ml 57.75 ml/m  AORTIC VALVE                    PULMONIC VALVE AV Area (Vmax):    1.90 cm     PV Vmax:       0.82 m/s AV Area (Vmean):   1.75 cm     PV Peak grad:  2.7 mmHg AV Area (VTI):     1.90 cm AV Vmax:           143.00 cm/s AV Vmean:          87.500 cm/s AV VTI:            0.212 m AV Peak Grad:      8.2 mmHg AV Mean Grad:      4.0 mmHg LVOT Vmax:         107.00 cm/s LVOT Vmean:        60.100 cm/s LVOT VTI:          0.158 m LVOT/AV VTI ratio: 0.75  AORTA Ao Root diam: 2.80 cm Ao Asc diam:  3.30 cm MITRAL VALVE             TRICUSPID VALVE MV Area VTI:  1.44 cm   TR Peak grad:   14.0 mmHg MV Peak grad: 6.4 mmHg   TR Vmax:        187.00 cm/s MV Mean grad: 3.0 mmHg MV Vmax:      1.26 m/s   SHUNTS MV Vmean:     71.9 cm/s  Systemic VTI:  0.16 m                          Systemic Diam: 1.80 cm                          Pulmonic Diam: 3.90 cm Vinie Maxcy MD Electronically signed by Vinie Maxcy MD Signature Date/Time: 01/13/2024/5:37:04 PM    Final    DG CHEST PORT 1 VIEW Result Date: 01/13/2024 EXAM: 1 VIEW XRAY OF THE CHEST 01/13/2024 12:41:23 AM COMPARISON: 12/28/2018 CLINICAL HISTORY: Tachypnea FINDINGS: LUNGS AND PLEURA: Low lung volumes accentuate interstitial markings. No focal pulmonary  opacity. No pulmonary edema. No pneumothorax. Left chest wall ICD. HEART AND MEDIASTINUM: Cardiomegaly. BONES AND SOFT TISSUES: No acute osseous abnormality. IMPRESSION: 1. No acute findings. 2. Cardiomegaly. Electronically signed by: Norman Gatlin MD 01/13/2024 12:46 AM EDT RP Workstation: [id number]     LOS: 1 day   Donalda Applebaum, MD  [facility]    To contact the attending provider  between 7A-7P or the covering provider during after hours 7P-7A, please log into the web site [email or web address] and access using universal [place] password for that web site. If you do not have the password, please call the hospital operator.  01/14/2024, 9:42 AM

## 2024-01-15 ENCOUNTER — Inpatient Hospital Stay (HOSPITAL_COMMUNITY)

## 2024-01-15 DIAGNOSIS — K922 Gastrointestinal hemorrhage, unspecified: Secondary | ICD-10-CM | POA: Diagnosis not present

## 2024-01-15 DIAGNOSIS — N1831 Chronic kidney disease, stage 3a: Secondary | ICD-10-CM

## 2024-01-15 DIAGNOSIS — Z7189 Other specified counseling: Secondary | ICD-10-CM | POA: Diagnosis not present

## 2024-01-15 DIAGNOSIS — Z01810 Encounter for preprocedural cardiovascular examination: Secondary | ICD-10-CM | POA: Diagnosis not present

## 2024-01-15 DIAGNOSIS — Z452 Encounter for adjustment and management of vascular access device: Secondary | ICD-10-CM | POA: Diagnosis not present

## 2024-01-15 DIAGNOSIS — I517 Cardiomegaly: Secondary | ICD-10-CM | POA: Diagnosis not present

## 2024-01-15 DIAGNOSIS — D649 Anemia, unspecified: Secondary | ICD-10-CM | POA: Diagnosis not present

## 2024-01-15 DIAGNOSIS — N289 Disorder of kidney and ureter, unspecified: Secondary | ICD-10-CM | POA: Diagnosis not present

## 2024-01-15 DIAGNOSIS — Z515 Encounter for palliative care: Secondary | ICD-10-CM

## 2024-01-15 DIAGNOSIS — I5082 Biventricular heart failure: Secondary | ICD-10-CM | POA: Diagnosis not present

## 2024-01-15 DIAGNOSIS — N179 Acute kidney failure, unspecified: Secondary | ICD-10-CM

## 2024-01-15 DIAGNOSIS — J811 Chronic pulmonary edema: Secondary | ICD-10-CM | POA: Diagnosis not present

## 2024-01-15 DIAGNOSIS — I4819 Other persistent atrial fibrillation: Secondary | ICD-10-CM | POA: Diagnosis not present

## 2024-01-15 DIAGNOSIS — I2510 Atherosclerotic heart disease of native coronary artery without angina pectoris: Secondary | ICD-10-CM | POA: Diagnosis not present

## 2024-01-15 DIAGNOSIS — I4891 Unspecified atrial fibrillation: Secondary | ICD-10-CM | POA: Diagnosis not present

## 2024-01-15 LAB — COMPREHENSIVE METABOLIC PANEL WITH GFR
ALT: 34 U/L (ref 0–44)
AST: 32 U/L (ref 15–41)
Albumin: 2.7 g/dL — ABNORMAL LOW (ref 3.5–5.0)
Alkaline Phosphatase: 110 U/L (ref 38–126)
Anion gap: 11 (ref 5–15)
BUN: 18 mg/dL (ref 6–20)
CO2: 15 mmol/L — ABNORMAL LOW (ref 22–32)
Calcium: 8.1 mg/dL — ABNORMAL LOW (ref 8.9–10.3)
Chloride: 108 mmol/L (ref 98–111)
Creatinine, Ser: 1.41 mg/dL — ABNORMAL HIGH (ref 0.61–1.24)
GFR, Estimated: 57 mL/min — ABNORMAL LOW (ref >60)
Glucose, Bld: 88 mg/dL (ref 70–99)
Potassium: 3.2 mmol/L — ABNORMAL LOW (ref 3.5–5.1)
Sodium: 134 mmol/L — ABNORMAL LOW (ref 135–145)
Total Bilirubin: 2.6 mg/dL — ABNORMAL HIGH (ref 0.0–1.2)
Total Protein: 5.7 g/dL — ABNORMAL LOW (ref 6.5–8.1)

## 2024-01-15 LAB — CBC
HCT: 26.4 % — ABNORMAL LOW (ref 39.0–52.0)
Hemoglobin: 7.9 g/dL — ABNORMAL LOW (ref 13.0–17.0)
MCH: 20.1 pg — ABNORMAL LOW (ref 26.0–34.0)
MCHC: 29.9 g/dL — ABNORMAL LOW (ref 30.0–36.0)
MCV: 67.2 fL — ABNORMAL LOW (ref 80.0–100.0)
Platelets: 122 K/uL — ABNORMAL LOW (ref 150–400)
RBC: 3.93 MIL/uL — ABNORMAL LOW (ref 4.22–5.81)
RDW: 21.7 % — ABNORMAL HIGH (ref 11.5–15.5)
WBC: 3.8 K/uL — ABNORMAL LOW (ref 4.0–10.5)
nRBC: 0 % (ref 0.0–0.2)

## 2024-01-15 LAB — COOXEMETRY PANEL
Carboxyhemoglobin: 1.7 % — ABNORMAL HIGH (ref 0.5–1.5)
Carboxyhemoglobin: 1.5 % (ref 0.5–1.5)
Methemoglobin: 0.8 % (ref 0.0–1.5)
Methemoglobin: <0.7 % (ref 0.0–1.5)
O2 Saturation: 44.4 %
O2 Saturation: 56.7 %
Total hemoglobin: 8.6 g/dL — ABNORMAL LOW (ref 12.0–16.0)
Total hemoglobin: 8.4 g/dL — ABNORMAL LOW (ref 12.0–16.0)

## 2024-01-15 LAB — BASIC METABOLIC PANEL WITH GFR
Anion gap: 12 (ref 5–15)
BUN: 21 mg/dL — ABNORMAL HIGH (ref 6–20)
CO2: 15 mmol/L — ABNORMAL LOW (ref 22–32)
Calcium: 9.1 mg/dL (ref 8.9–10.3)
Chloride: 106 mmol/L (ref 98–111)
Creatinine, Ser: 1.53 mg/dL — ABNORMAL HIGH (ref 0.61–1.24)
GFR, Estimated: 52 mL/min — ABNORMAL LOW (ref >60)
Glucose, Bld: 79 mg/dL (ref 70–99)
Potassium: 3.7 mmol/L (ref 3.5–5.1)
Sodium: 133 mmol/L — ABNORMAL LOW (ref 135–145)

## 2024-01-15 MED ORDER — SODIUM CHLORIDE 0.9% FLUSH
10.0000 mL | INTRAVENOUS | Status: DC | PRN
  Administered 2024-01-19: 10 mL

## 2024-01-15 MED ORDER — LACTULOSE 10 GM/15ML PO SOLN
10.0000 g | Freq: Two times a day (BID) | ORAL | Status: DC
  Administered 2024-01-15 – 2024-01-21 (×13): 10 g via ORAL
  Filled 2024-01-15: qty 30
  Filled 2024-01-15 (×13): qty 15

## 2024-01-15 MED ORDER — TAMSULOSIN HCL 0.4 MG PO CAPS
0.4000 mg | ORAL_CAPSULE | Freq: Every day | ORAL | Status: DC
  Administered 2024-01-15 – 2024-01-21 (×7): 0.4 mg via ORAL
  Filled 2024-01-15 (×7): qty 1

## 2024-01-15 MED ORDER — CHLORHEXIDINE GLUCONATE CLOTH 2 % EX PADS
6.0000 | MEDICATED_PAD | Freq: Every day | CUTANEOUS | Status: DC
  Administered 2024-01-15 – 2024-01-21 (×7): 6 via TOPICAL

## 2024-01-15 MED ORDER — SODIUM CHLORIDE 0.9% FLUSH
10.0000 mL | Freq: Two times a day (BID) | INTRAVENOUS | Status: DC
  Administered 2024-01-15: 20 mL
  Administered 2024-01-15 – 2024-01-21 (×9): 10 mL

## 2024-01-15 MED ORDER — QUETIAPINE FUMARATE ER 200 MG PO TB24
200.0000 mg | ORAL_TABLET | Freq: Every day | ORAL | Status: DC
  Administered 2024-01-15 – 2024-01-20 (×6): 200 mg via ORAL
  Filled 2024-01-15 (×8): qty 1

## 2024-01-15 MED ORDER — FUROSEMIDE 10 MG/ML IJ SOLN
20.0000 mg/h | INTRAVENOUS | Status: DC
  Administered 2024-01-15 – 2024-01-18 (×6): 20 mg/h via INTRAVENOUS
  Filled 2024-01-15 (×5): qty 20

## 2024-01-15 MED ORDER — MILRINONE LACTATE IN DEXTROSE 20-5 MG/100ML-% IV SOLN
0.1250 ug/kg/min | INTRAVENOUS | Status: DC
  Administered 2024-01-15 – 2024-01-16 (×2): 0.25 ug/kg/min via INTRAVENOUS
  Administered 2024-01-17: 0.125 ug/kg/min via INTRAVENOUS
  Filled 2024-01-15 (×2): qty 100

## 2024-01-15 NOTE — Plan of Care (Signed)

## 2024-01-15 NOTE — Progress Notes (Signed)
 Pt blood pressure at 1800 70/47 -  cuff readjusted BP 80/55 per parameters Milrinone  stopped. MD notified. Spoke with Dr. Okey - RN was advised to also stop Lasix . Hold Milrinone  until BP (systolic) above 90. BP rechecked at 1845 67/56, cuff readjusted 88/65. RN Paged PA -Zhane. RN was advise to hold any Metoprolol  due to BP and follow instructions to hold Milrinone  until BP above 90.

## 2024-01-15 NOTE — Consult Note (Cosign Needed Addendum)
 Consultation Note Date: 01/15/2024   Patient Name: Cody Guzman  DOB: Aug 09, 1965  MRN: [id number]  Age / Sex: 58 y.o., male  PCP: Theotis Haze ORN, NP Referring Physician: Raenelle Donalda HERO, MD  Reason for Consultation: Establishing goals of care  HPI/Patient Profile: 58 y.o. male  with past medical history of cardiomyopathy, prior stroke, A-fib, non-Hodgkin's lymphoma in remission, CAD admitted on 01/11/2024 with abdominal pain radiating into his chest for a few days as well as black stools.   Patient was admitted with acute GI bleeding and acute blood loss anemia, A-fib with RVR, AKI.  GIB thought to be related to PUD.  Due to his biventricular heart failure/cardiomyopathy, GI recommended optimizing cardiac status before EGD.  EF is 25 to 30% per echo on 9/10.  Patient's serious illness further complicated by incidental finding of early cirrhosis on CT and minimally elevated ammonia. PMT has been consulted to assist with goals of care conversation.  Clinical Assessment and Goals of Care:  I have reviewed medical records including EPIC notes, labs and imaging, assessed the patient and then met at the bedside to discuss diagnosis prognosis, GOC, EOL wishes, disposition and options.  I introduced Palliative Medicine as specialized medical care for people living with serious illness. It focuses on providing relief from the symptoms and stress of a serious illness. The goal is to improve quality of life for both the patient and the family.  We discussed a brief life review of the patient and then focused on their current illness.   I attempted to elicit values and goals of care important to the patient.    Medical History Review and Understanding:  We discussed patient's acute illness in the context of their chronic comorbidities.  He understands that his heart is functioning too poorly for appropriate diagnostics for his GI bleed.  He understands that he is  requiring medications to hold up his blood pressure and that the low blood pressure is precluding him from other medications that could be helpful.  Social History: Patient reports he that he has a wife of 18 years.  He has 7 children, 7 grandchildren, and just welcomed a great granddaughter.  He is also very close to his nephew.  He has been trying to move to a better area of town for the past 3 years but having difficulty due to lack of advocacy and frequent change of case managers.  He just started school at [employer] for criminal [employer].  He previously enjoyed being very active playing sports and drag racing.  He is a man of faith.  Previously lived in [place] and was around a lot of violence in his past.  He enjoys using his scooter, the fastest in town, and pink attended to his wardrobe.  Functional and Nutritional State: Patient reports he is completely independent with ADLs and IADLs.  Palliative Symptoms: Postnasal drip, frequent stools, shortness of breath, dizziness, painful urination, abdominal pain 8 out of 10, muscle spasms of his hands and feet  Advance Directives: A detailed discussion regarding advanced directives was had.  Patient is interested in completion during this hospital stay.  He would name his wife as primary HCPOA followed by his daughter.  Code Status: Concepts specific to code status, artifical feeding and hydration, and rehospitalization were considered and discussed. At this time, he is open to all aggressive measures to prolong his life.  Discussion: Patient describes his quality of life as fair - despite symptom burden, it is currently much better than  a few years ago when he was homeless.  He shares that his wife is quite angry with how his functioning has changed over the years, feeling robbed after he had his stroke and that he has put his health on the line for others.  We discussed his history of fighting/violence and how he has now put his faith in God.  He  tries not to get emotionally aroused anymore. 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  He has plans to buy a new car for the first time next week. He is definitely not ready for end-of-life.   He has discussed his wishes with his wife on several occasions and shares that his religious beliefs do not impact his care preferences for medical treatments.  He feels that he will trust the doctors' recommendations and would be open to life support if necessary.  Discussed the importance of considering best case and worst-case scenarios, as well as next steps if he does need aggressive life-prolonging care.  He was told his wife to consider him as sleeping if he is on life support unless told by doctors that there is no chance for his survival.  He hopes for continued improvement and does feel much better than yesterday. Called patient's significant other and provided update on the above conversation. She shared her agreement with his communication of his wishes. She would be agreeable to serving as HCPOA. She spoke with patient's attending about his current illness and did not realize the severity of his illness before this conversation, though certainly new things were not great. She has been talking to him a lot today about the importance of improving his health behaviors and that he does not have the chances that other people might have to do the right thing and take care of himself.  She is hopeful for his improvement.  She provided me with her email address to send advanced directive documentations per patient's request.     Discussed the importance of continued conversation with family and the medical providers regarding overall plan of care and treatment options, ensuring decisions are within the context of the patient's values and GOCs.   Questions and concerns were addressed.  Hard Choices booklet left for review. The  family was encouraged to call with questions or concerns.  PMT will continue to support holistically.   SUMMARY OF RECOMMENDATIONS   - Continue full code/full scope treatment - Patient is open to completion of advance directives during this hospital stay, spiritual care consulted - Patient's goal is to open up a children's facility, moved to a better area of town, continue bettering himself overall - Ongoing goals of care discussions - Psychosocial and emotional support provided - PMT will continue to follow and support   Prognosis:  Poor  Discharge Planning: To Be Determined      Primary Diagnoses: Present on Admission:  Upper GI bleed  Atrial fibrillation with rapid ventricular response (HCC)  Abdominal pain  HLD (hyperlipidemia)  Essential hypertension  Bipolar 1 disorder (HCC)  Gout  OSA (obstructive sleep apnea)   Physical Exam Vitals and nursing note reviewed.  Constitutional:      General: He is not in acute distress.    Appearance: He is ill-appearing.  HENT:     Head: Normocephalic.  Cardiovascular:     Rate and Rhythm: Normal rate.  Pulmonary:     Effort: Tachypnea present.  Neurological:     Mental Status: He is alert and oriented to person, place, and time.     Motor: Weakness present.  Psychiatric:        Mood and Affect: Mood normal.        Behavior: Behavior normal.     Vital Signs: BP 110/65 (BP Location: Right Arm)   Pulse 85   Temp (!) 97.1 F (36.2 C) (Oral)   Resp (!) 24   Wt 85.5 kg   SpO2 93%   BMI 28.68 kg/m  Pain Scale: 0-10   Pain Score: 0-No pain   SpO2: SpO2: 93 % O2 Device:SpO2: 93 % O2 Flow Rate: .    Palliative Assessment/Data: TBD   MDM: high Additional ACP Time: 30 minutes   Pascale Maves P Panzy Bubeck, PA-C  Palliative Medicine Team Team phone # [phone]  Thank you for allowing the Palliative Medicine Team to assist in the care of this patient. Please utilize secure chat with additional questions, if there  is no response within 30 minutes please call the above phone number.  Palliative Medicine Team providers are available by phone from 7am to 7pm daily and can be reached through the team cell phone.  Should this patient require assistance outside of these hours, please call the patient's attending physician.

## 2024-01-15 NOTE — Progress Notes (Signed)
 PT Cancellation Note  Patient Details Name: Cody Guzman MRN: [id number] DOB: 05-23-1965   Cancelled Treatment:    Reason Eval/Treat Not Completed: Other (comment) (Per RN, pt is transferring to [facility]. Will follow up later if time allows.)   Harly Pipkins 01/15/2024, 10:08 AM
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
## 2024-01-15 NOTE — Progress Notes (Addendum)
 PROGRESS NOTE        PATIENT DETAILS Name: Cody Guzman Age: 58 y.o. Sex: male Date of Birth: [birth year] Admit Date: 01/11/2024 Admitting Physician Redia LOISE Cleaver, MD ERE:[id number], Haze ORN, NP  Brief Summary: Patient is a 58 y.o.  male with history of HFrEF-s/p ICD placement, atrial fibrillation, CVA, CKD stage IIIb-who presented to the hospital with several days history of upper abdominal discomfort/melanotic stools.  Patient was found to have upper GI bleeding with acute blood loss anemia.  Significant events: 9/8>> admit to [facility]  Significant studies: 9/9>> CT abdomen/pelvis: No acute abnormalities.  Possible early cirrhosis. 9/10>> CXR: No PNA 9/10>> echo: EF 25-30%  Significant microbiology data: None  Procedures: None  Consults: GI Cardiology  Subjective: More awake/alert-claims he has been on Seroquel  for years-apparently takes 300 mg daily.  Objective: Vitals: Blood pressure 110/65, pulse 85, temperature (!) 97.1 F (36.2 C), temperature source Oral, resp. rate (!) 24, weight 85.5 kg, SpO2 93%.   Exam: Awake/alert Chest: Clear to auscultation CVS: S1-S2 regular Abdomen: Soft nontender nondistended Extremities: Trace edema.  Pertinent Labs/Radiology:    Latest Ref Rng & Units 01/15/2024    4:09 AM 01/14/2024    5:58 AM 01/13/2024    5:48 PM  CBC  WBC 4.0 - 10.5 K/uL 3.8  4.5  5.1   Hemoglobin 13.0 - 17.0 g/dL 7.9  8.4  8.2   Hematocrit 39.0 - 52.0 % 26.4  28.0  27.7   Platelets 150 - 400 K/uL 122  148  147     Lab Results  Component Value Date   NA 133 (L) 01/15/2024   K 3.7 01/15/2024   CL 106 01/15/2024   CO2 15 (L) 01/15/2024      Assessment/Plan: UGI bleed with ABLA Bleeding seems to have resolved-no BM since admission-Hb stable Suspicion for peptic ulcer-given upper abdominal discomfort Continue PPI GI holding off on EGD-apparently needs cardiac status to be more optimized. Continue to follow  CBC.  Upper abdominal discomfort Given GI bleeding-could be peptic ulcer disease No significant abnormality seen on CT abdomen-lipase within normal limits-still tender in the epigastric area without any peritoneal signs Continue PPI/Carafate .    Minimally elevated troponins Trend is flat-doubt of any clinical significance  Acute toxic metabolic encephalopathy Has been mostly sleepy yesterday-but more awake and alert Not exactly sure the exact etiology-probably multifactorial-suspicion for low flow state/CHF-with contributions from Seroquel /Topamax  and possibly mildly elevated ammonia levels See below regarding cardiology input regarding low flow state Have decreased Seroquel  dosage to 200 mg Starting lactulose  Follow closely.  Chronic HFrEF/nonischemic cardiomyopathy-s/p ICD implantation Volume status stable Some concern for low flow state-cardiology planning to place PICC line and check Co. Oximetry-and perhaps short-term inotropic therapy to get him through GI procedures. Continue beta-blocker-dosage adjusted slightly due to soft blood pressure On midodrine  Since blood pressure remains soft- Jardiance /ARB/Aldactone /Demadex  remains on hold Cardiology following-per cardiology-not a long term candidate for advanced therapies-if he does indeed have low output heart failure-his overall prognosis is going to be poor.  Will get palliative care involved at this point.  PAF Rate controlled-continue amiodarone /metoprolol . Eliquis  on hold due to GI bleed.  History of CVA No obvious focal deficits Continue statin Resume Eliquis  when GI workup is complete.  CKD stage IIIb Close to baseline Note-AKI ruled out.  Possible early cirrhosis noted on CT Incidental finding Minimally elevated ammonia  levels-will start lactulose -to see if this may help with some amount of lethargy.  History of Hodgkin's lymphoma Reportedly in remission Outpatient oncology follow-up  Bipolar  disorder Stable Per patient he does take 300 mg of Seroquel -given some amount of lethargy-Will reduce dosage to 200 mg.  Chronic daily headaches On Topamax -acknowledges that he has been on it for a while as well.  Gout No flare Allopurinol   BPH Flomax   Code status:   Code Status: Full Code   DVT Prophylaxis: SCDs Start: 01/12/24 0052  Family Communication: Significant Carlean Servant- [phone] 9/12   Disposition Plan: Status is: Observation The patient will require care spanning > 2 midnights and should be moved to inpatient because: Severity of illness   Planned Discharge Destination:Home   Diet: Diet Order             Diet Heart Room service appropriate? Yes; Fluid consistency: Thin  Diet effective now                     Antimicrobial agents: Anti-infectives (From admission, onward)    None        MEDICATIONS: Scheduled Meds:  allopurinol   100 mg Oral Daily   amiodarone   100 mg Oral Daily   atorvastatin   80 mg Oral Daily   lactulose   10 g Oral BID   metoprolol  succinate  25 mg Oral Daily   midodrine   5 mg Oral TID WC   pantoprazole  (PROTONIX ) IV  40 mg Intravenous Q12H   sucralfate   1 g Oral TID WC & HS   tamsulosin   0.4 mg Oral QPC supper   topiramate   25 mg Oral Daily   Continuous Infusions: PRN Meds:.albuterol , oxyCODONE    I have personally reviewed following labs and imaging studies  LABORATORY DATA: CBC: Recent Labs  Lab 01/12/24 0800 01/12/24 0819 01/13/24 0019 01/13/24 0450 01/13/24 1748 01/14/24 0558 01/15/24 0409  WBC 4.7  --   --  5.1 5.1 4.5 3.8*  NEUTROABS  --   --   --  3.3  --   --   --   HGB 7.6*   < > 7.9* 7.9* 8.2* 8.4* 7.9*  HCT 26.1*   < > 26.6* 26.1* 27.7* 28.0* 26.4*  MCV 70.4*  --   --  67.4* 68.6* 68.1* 67.2*  PLT 158  --   --  159 147* 148* 122*   < > = values in this interval not displayed.    Basic Metabolic Panel: Recent Labs  Lab 01/11/24 1731 01/11/24 1755 01/12/24 0800  01/12/24 0819 01/13/24 0450 01/14/24 0558 01/15/24 0409  NA 134* 137 133* 138 136 132* 133*  K 4.2 4.2 3.7 3.7 4.0 4.6 3.7  CL 103 104 105  --  107 105 106  CO2 16*  --  17*  --  18* 17* 15*  GLUCOSE 84 80 77  --  100* 97 79  BUN 24* 22* 23*  --  22* 21* 21*  CREATININE 1.76* 1.80* 1.59*  --  1.62* 1.66* 1.53*  CALCIUM  9.9  --  9.2  --  9.2 9.3 9.1    GFR: Estimated Creatinine Clearance: 54.6 mL/min (A) (by C-G formula based on SCr of 1.53 mg/dL (H)).  Liver Function Tests: Recent Labs  Lab 01/12/24 0036 01/12/24 0800  AST 77* 61*  ALT 56* 49*  ALKPHOS 121 104  BILITOT 5.5* 4.6*  PROT 6.8 6.0*  ALBUMIN 3.3* 2.8*   Recent Labs  Lab 01/12/24 0036  LIPASE 31  Recent Labs  Lab 01/14/24 1622  AMMONIA 59*    Coagulation Profile: No results for input(s): INR, PROTIME in the last 168 hours.  Cardiac Enzymes: No results for input(s): CKTOTAL, CKMB, CKMBINDEX, TROPONINI in the last 168 hours.  BNP (last 3 results) No results for input(s): PROBNP in the last 8760 hours.  Lipid Profile: No results for input(s): CHOL, HDL, LDLCALC, TRIG, CHOLHDL, LDLDIRECT in the last 72 hours.  Thyroid  Function Tests: No results for input(s): TSH, T4TOTAL, FREET4, T3FREE, THYROIDAB in the last 72 hours.  Anemia Panel: Recent Labs    01/12/24 1502  VITAMINB12 1,710*  FOLATE 10.8  FERRITIN 10*  TIBC 497*  IRON 16*  RETICCTPCT 2.1    Urine analysis:    Component Value Date/Time   COLORURINE AMBER (A) 01/14/2024 1417   APPEARANCEUR CLEAR 01/14/2024 1417   APPEARANCEUR Clear 08/20/2021 1423   LABSPEC 1.017 01/14/2024 1417   PHURINE 6.0 01/14/2024 1417   GLUCOSEU NEGATIVE 01/14/2024 1417   HGBUR NEGATIVE 01/14/2024 1417   BILIRUBINUR NEGATIVE 01/14/2024 1417   BILIRUBINUR Negative 08/20/2021 1423   KETONESUR NEGATIVE 01/14/2024 1417   PROTEINUR 30 (A) 01/14/2024 1417   UROBILINOGEN negative (A) 03/15/2018 1749   NITRITE NEGATIVE  01/14/2024 1417   LEUKOCYTESUR NEGATIVE 01/14/2024 1417    Sepsis Labs: Lactic Acid, Venous    Component Value Date/Time   LATICACIDVEN 2.1 (HH) 01/14/2024 1921    MICROBIOLOGY: No results found for this or any previous visit (from the past 240 hours).  RADIOLOGY STUDIES/RESULTS: US  EKG SITE RITE Result Date: 01/14/2024 If Site Rite image not attached, placement could not be confirmed due to current cardiac rhythm.  ECHOCARDIOGRAM COMPLETE Result Date: 01/13/2024    ECHOCARDIOGRAM REPORT   Patient Name:   Ahmari Akerele-Ale Date of Exam: 01/13/2024 Medical Rec #:  [id number]           Height:       68.0 in Accession #:    [id number]          Weight:       192.0 lb Date of Birth:  [birth year]            BSA:          2.009 m Patient Age:    58 years            BP:           88/70 mmHg Patient Gender: M                   HR:           75 bpm. Exam Location:  Inpatient Procedure: 2D Echo, Cardiac Doppler and Color Doppler (Both Spectral and Color            Flow Doppler were utilized during procedure). Indications:    Nonischemic Cardiomyopathy I42.8  History:        Patient has prior history of Echocardiogram examinations, most                 recent 08/27/2020. CHF and Cardiomegaly, Arrythmias:RBBB, Atrial                 Fibrillation and Atrial Flutter, Signs/Symptoms:Chest Pain,                 Syncope and Shortness of Breath; Risk Factors:Hypertension.  Sonographer:    BERNARDA ROCKS Referring Phys: AMY S ESTERWOOD IMPRESSIONS  1. Left ventricular ejection fraction, by estimation, is 25 to 30%. Left ventricular  ejection fraction by 2D MOD biplane is 30.0 %. The left ventricle has severely decreased function. The left ventricle demonstrates global hypokinesis. The left ventricular internal cavity size was moderately dilated. Left ventricular diastolic function could not be evaluated.  2. Right ventricular systolic function is moderately reduced. The right ventricular size is normal. A pacemaker lead  noted in the mid-intraventricular septum is visualized. Tricuspid regurgitation signal is inadequate for assessing PA pressure. The estimated right ventricular systolic pressure is 29.0 mmHg.  3. Left atrial size was severely dilated.  4. Right atrial size was severely dilated.  5. There is no evidence of cardiac tamponade.  6. The mitral valve is abnormal. Moderate mitral valve regurgitation.  7. The tricuspid valve is abnormal. Tricuspid valve regurgitation is moderate to severe.  8. The aortic valve is tricuspid. Aortic valve regurgitation is not visualized.  9. The inferior vena cava is dilated in size with <50% respiratory variability, suggesting right atrial pressure of 15 mmHg. Comparison(s): Changes from prior study are noted. 08/27/2020: LVEF <20%. FINDINGS  Left Ventricle: Left ventricular ejection fraction, by estimation, is 25 to 30%. Left ventricular ejection fraction by 2D MOD biplane is 30.0 %. The left ventricle has severely decreased function. The left ventricle demonstrates global hypokinesis. The left ventricular internal cavity size was moderately dilated. There is no left ventricular hypertrophy. Left ventricular diastolic function could not be evaluated due to paced rhythm. Left ventricular diastolic function could not be evaluated. Right Ventricle: The right ventricular size is normal. No increase in right ventricular wall thickness. Right ventricular systolic function is moderately reduced. Tricuspid regurgitation signal is inadequate for assessing PA pressure. The tricuspid regurgitant velocity is 1.87 m/s, and with an assumed right atrial pressure of 15 mmHg, the estimated right ventricular systolic pressure is 29.0 mmHg. Left Atrium: Left atrial size was severely dilated. Right Atrium: Right atrial size was severely dilated. Pericardium: Trivial pericardial effusion is present. The pericardial effusion is circumferential. There is no evidence of cardiac tamponade. Mitral Valve: The mitral  valve is abnormal. There is moderate thickening of the anterior and posterior mitral valve leaflet(s). Moderate mitral valve regurgitation, with eccentric posteriorly directed jet. MV peak gradient, 6.4 mmHg. The mean mitral valve gradient is 3.0 mmHg. Tricuspid Valve: The tricuspid valve is abnormal. Tricuspid valve regurgitation is moderate to severe. Aortic Valve: The aortic valve is tricuspid. Aortic valve regurgitation is not visualized. Aortic valve mean gradient measures 4.0 mmHg. Aortic valve peak gradient measures 8.2 mmHg. Aortic valve area, by VTI measures 1.90 cm. Pulmonic Valve: The pulmonic valve was grossly normal. Pulmonic valve regurgitation is trivial. Aorta: The aortic root and ascending aorta are structurally normal, with no evidence of dilitation. Venous: The inferior vena cava is dilated in size with less than 50% respiratory variability, suggesting right atrial pressure of 15 mmHg. IAS/Shunts: No atrial level shunt detected by color flow Doppler. Additional Comments: A pacemaker lead noted in the mid-intraventricular septum is visualized.  LEFT VENTRICLE PLAX 2D                        Biplane EF (MOD) LVIDd:         6.20 cm         LV Biplane EF:   Left LVIDs:         5.40 cm                          ventricular LV PW:  0.70 cm                          ejection LV IVS:        0.60 cm                          fraction by LVOT diam:     1.80 cm                          2D MOD LV SV:         40                               biplane is LV SV Index:   20                               30.0 %. LVOT Area:     2.54 cm  LV Volumes (MOD) LV vol d, MOD    413.0 ml A2C: LV vol d, MOD    288.0 ml A4C: LV vol s, MOD    284.0 ml A2C: LV vol s, MOD    212.0 ml A4C: LV SV MOD A2C:   129.0 ml LV SV MOD A4C:   288.0 ml LV SV MOD BP:    104.8 ml RIGHT VENTRICLE            IVC RV S prime:     9.25 cm/s  IVC diam: 4.10 cm RVOT diam:      3.90 cm TAPSE (M-mode): 1.5 cm LEFT ATRIUM              Index         RIGHT ATRIUM           Index LA diam:        5.30 cm  2.64 cm/m   RA Area:     37.90 cm LA Vol (A2C):   150.0 ml 74.68 ml/m  RA Volume:   160.00 ml 79.66 ml/m LA Vol (A4C):   86.0 ml  42.82 ml/m LA Biplane Vol: 116.0 ml 57.75 ml/m  AORTIC VALVE                    PULMONIC VALVE AV Area (Vmax):    1.90 cm     PV Vmax:       0.82 m/s AV Area (Vmean):   1.75 cm     PV Peak grad:  2.7 mmHg AV Area (VTI):     1.90 cm AV Vmax:           143.00 cm/s AV Vmean:          87.500 cm/s AV VTI:            0.212 m AV Peak Grad:      8.2 mmHg AV Mean Grad:      4.0 mmHg LVOT Vmax:         107.00 cm/s LVOT Vmean:        60.100 cm/s LVOT VTI:          0.158 m LVOT/AV VTI ratio: 0.75  AORTA Ao Root diam: 2.80 cm Ao Asc diam:  3.30 cm MITRAL VALVE             TRICUSPID VALVE MV  Area VTI:  1.44 cm   TR Peak grad:   14.0 mmHg MV Peak grad: 6.4 mmHg   TR Vmax:        187.00 cm/s MV Mean grad: 3.0 mmHg MV Vmax:      1.26 m/s   SHUNTS MV Vmean:     71.9 cm/s  Systemic VTI:  0.16 m                          Systemic Diam: 1.80 cm                          Pulmonic Diam: 3.90 cm Vinie Maxcy MD Electronically signed by Vinie Maxcy MD Signature Date/Time: 01/13/2024/5:37:04 PM    Final      LOS: 2 days   Donalda Applebaum, MD  [facility]    To contact the attending provider between 7A-7P or the covering provider during after hours 7P-7A, please log into the web site [email or web address] and access using universal [place] password for that web site. If you do not have the password, please call the hospital operator.  01/15/2024, 8:52 AM

## 2024-01-15 NOTE — Evaluation (Signed)
 Occupational Therapy Evaluation Patient Details Name: Cody Guzman MRN: [id number] DOB: [birth year] Today's Date: 01/15/2024   History of Present Illness   Pt is 58 yo presenting to [facility] on 9/8 due to upper abdominal discomfort/melanotic stools. Found to have upper GIB with acute blood loss anemia. PMHx: CAD, ICM, non-hodgkin's lymphoma in remmision, migraines, CKD2, CVA, HFrEF s/p ICD placement.     Clinical Impressions At baseline, pt is Ind with ADLs, IADLs, and functional mobility. Pt works and drives. Pt now presents close to baseline PLOF but with decreased activity tolerance and impaired cardiopulmonary status affecting functional level. Pt currently largely completing ADLs Independent to Contact guard assist and functional mobility/transfers with Supervision to Contact guard assist with a RW. Pt with multiple episodes of Vtach, soft but stable BP, and O2 sat >/96% or RA with pt reporting intermittent mild dizziness during session. Pt participated well in session and is motivated to return to PLOF. Pt will benefit from acute OT to address deficits and increase safety and independence with functional tasks. No post acute skilled OT needs are anticipated at this time.       If plan is discharge home, recommend the following:   A little help with walking and/or transfers;A little help with bathing/dressing/bathroom;Assistance with cooking/housework;Assist for transportation;Help with stairs or ramp for entrance     Functional Status Assessment   Patient has had a recent decline in their functional status and demonstrates the ability to make significant improvements in function in a reasonable and predictable amount of time.     Equipment Recommendations   None recommended by OT     Recommendations for Other Services         Precautions/Restrictions   Precautions Precautions: Fall Recall of Precautions/Restrictions: Intact Restrictions Weight Bearing Restrictions  Per Provider Order: No     Mobility Bed Mobility Overal bed mobility: Independent                  Transfers Overall transfer level: Needs assistance Equipment used: None, Rolling walker (2 wheels) Transfers: Sit to/from Stand, Bed to chair/wheelchair/BSC Sit to Stand: Supervision     Step pivot transfers: Contact guard assist, Supervision            Balance Overall balance assessment: Mild deficits observed, not formally tested                                         ADL either performed or assessed with clinical judgement   ADL Overall ADL's : Needs assistance/impaired Eating/Feeding: Independent;Sitting   Grooming: Supervision/safety;Standing   Upper Body Bathing: Supervision/ safety;Set up;Sitting   Lower Body Bathing: Supervison/ safety;Sit to/from stand   Upper Body Dressing : Modified independent;Sitting   Lower Body Dressing: Supervision/safety;Sit to/from stand   Toilet Transfer: Contact guard assist;Rolling walker (2 wheels);Ambulation Toilet Transfer Details (indicate cue type and reason): CGA for safety Toileting- Clothing Manipulation and Hygiene: Supervision/safety;Sitting/lateral lean;Sit to/from stand       Functional mobility during ADLs: Contact guard assist;Rolling walker (2 wheels) (CGA for safety) General ADL Comments: Supervision for CGA for safety; no physical assistance needed     Vision Patient Visual Report: No change from baseline Vision Assessment?: No apparent visual deficits     Perception         Praxis         Pertinent Vitals/Pain Pain Assessment Pain Assessment: Faces Faces Pain Scale: Hurts  little more Pain Location: abodmen Pain Descriptors / Indicators: Aching, Discomfort, Grimacing Pain Intervention(s): Limited activity within patient's tolerance, Monitored during session, Repositioned     Extremity/Trunk Assessment Upper Extremity Assessment Upper Extremity Assessment: Right hand  dominant;Overall [facility] for tasks assessed   Lower Extremity Assessment Lower Extremity Assessment: Defer to PT evaluation   Cervical / Trunk Assessment Cervical / Trunk Assessment: Normal   Communication Communication Communication: No apparent difficulties   Cognition Arousal: Alert Behavior During Therapy: WFL for tasks assessed/performed Cognition: No apparent impairments                               Following commands: Intact       Cueing  General Comments   Cueing Techniques: Verbal cues  soft but stable BP   Exercises     Shoulder Instructions      Home Living Family/patient expects to be discharged to:: Private residence Living Arrangements: Other relatives (nephew) Available Help at Discharge: Family;Available PRN/intermittently Type of Home: Other(Comment) (condo) Home Access: Stairs to enter Entrance Stairs-Number of Steps: 9 Entrance Stairs-Rails: Right;Left;Can reach both Home Layout: One level     Bathroom Shower/Tub: Tub/shower unit;Walk-in shower   Bathroom Toilet: Standard Bathroom Accessibility: No   Home Equipment: Cane - single point;Shower seat - built in          Prior Functioning/Environment Prior Level of Function : Independent/Modified Independent;Working/employed;Driving;History of Falls (last six months)             Mobility Comments: occasional use of SPC with 2 falls reported in the past 6 months. ADLs Comments: Ind/Mod I with ADLs and IADLs. 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    OT Problem List: Cardiopulmonary status limiting activity;Decreased activity tolerance   OT Treatment/Interventions: Self-care/ADL training;Energy conservation;DME and/or AE instruction;Patient/family education;Therapeutic activities      OT Goals(Current goals can be found in the care plan section)   Acute Rehab OT Goals Patient Stated Goal: to feel better and return home OT Goal Formulation: With patient Time  For Goal Achievement: 01/29/24 Potential to Achieve Goals: Good ADL Goals Pt Will Perform Grooming: Independently;standing Pt Will Perform Lower Body Bathing: Independently;sit to/from stand Pt Will Perform Lower Body Dressing: Independently;sit to/from stand Pt Will Transfer to Toilet: with modified independence;ambulating;regular height toilet (with least restrictive AD) Pt Will Perform Toileting - Clothing Manipulation and hygiene: Independently;sit to/from stand;sitting/lateral leans Additional ADL Goal #1: Patient will demonstrate ability to Indepndently state 3 energy conservation strategies to increase safety and independence with functional tasks.   OT Frequency:  Min 1X/week    Co-evaluation              AM-PAC OT 6 Clicks Daily Activity     Outcome Measure Help from another person eating meals?: None Help from another person taking care of personal grooming?: A Little Help from another person toileting, which includes using toliet, bedpan, or urinal?: A Little Help from another person bathing (including washing, rinsing, drying)?: A Little Help from another person to put on and taking off regular upper body clothing?: None Help from another person to put on and taking off regular lower body clothing?: A Little 6 Click Score: 20   End of Session Equipment Utilized During Treatment: Other (comment);Gait belt;Rolling walker (2 wheels) (BSC) Nurse Communication: Mobility status;Other (comment) (Pt had BM during session. Soft but stable BP.)  Activity Tolerance: Patient tolerated treatment well;Patient limited by fatigue Patient  left: in bed;with call bell/phone within reach;with bed alarm set  OT Visit Diagnosis: Other (comment) (decreased activity tolerance)                Time: 8394-8343 OT Time Calculation (min): 51 min Charges:  OT General Charges $OT Visit: 1 Visit OT Evaluation $OT Eval Low Complexity: 1 Low OT Treatments $Self Care/Home Management : 23-37  mins  Margarie Rockey HERO., OTR/L, MA Acute Rehab [phone]   Margarie FORBES Horns 01/15/2024, 6:08 PM

## 2024-01-15 NOTE — Progress Notes (Signed)
 Significant other called last night for updates and wanted to know what medication patient was getting.  This RN went over medication and at the name of seroquel  she said he was taken off it before because it makes him drowsy all the time.  She is asking that this medication be reevaluated and if he has to have it, it should be lower dose.  Medication was not given last night and this morning patient was able to walk to bathroom.  He is more alert and interactive than he was at the beginning of this shift.  She is requesting a cal from MD for updates.

## 2024-01-15 NOTE — TOC Progression Note (Signed)
 Transition of Care [facility]) - Progression Note    Patient Details  Name: Cody Guzman MRN: [id number] Date of Birth: August 10, 1965  Transition of [facility] CM/SW Contact  Lauraine FORBES Saa, LCSWA Phone Number: 01/15/2024, 3:26 PM  Clinical Narrative:     3:26 PM Per chart review, patient resides at home with nephew. Patient has a PCP and insurance. Patient does not have SNF or HH history. Patient has DME (CPAP, hospital bed, manual wheelchair) history with [employer]. Patient's preferred pharmacy's are [facility], [facility], [facility], and [facility]. CSW provided SDOH (transportation) resources. TOC will continue to follow and be available to assist.  Expected Discharge Plan: Home/Self Care Barriers to Discharge: Continued Medical Work up               Expected Discharge Plan and Services   Discharge Planning Services: CM Consult   Living arrangements for the past 2 months: Apartment                                       Social Drivers of Health (SDOH) Interventions SDOH Screenings   Food Insecurity: No Food Insecurity (01/12/2024)  Housing: Low Risk  (01/12/2024)  Transportation Needs: Unmet Transportation Needs (01/12/2024)  Utilities: Not At Risk (01/12/2024)  Alcohol  Screen: Low Risk  (01/13/2023)  Depression (PHQ2-9): High Risk (08/17/2023)  Financial Resource Strain: Low Risk  (01/13/2023)  Physical Activity: Insufficiently Active (01/13/2023)  Social Connections: Moderately Integrated (01/13/2023)  Stress: No Stress Concern Present (01/13/2023)  Tobacco Use: High Risk (01/12/2024)  Health Literacy: Adequate Health Literacy (01/13/2023)    Readmission Risk Interventions     No data to display

## 2024-01-15 NOTE — Progress Notes (Signed)
 Peripherally Inserted Central Catheter Placement  The IV Nurse has discussed with the patient and/or persons authorized to consent for the patient, the purpose of this procedure and the potential benefits and risks involved with this procedure.  The benefits include less needle sticks, lab draws from the catheter, and the patient may be discharged home with the catheter. Risks include, but not limited to, infection, bleeding, blood clot (thrombus formation), and puncture of an artery; nerve damage and irregular heartbeat and possibility to perform a PICC exchange if needed/ordered by physician.  Alternatives to this procedure were also discussed.  Bard Power PICC patient education guide, fact sheet on infection prevention and patient information card has been provided to patient /or left at bedside.    PICC Placement Documentation  PICC Double Lumen 01/15/24 Right Brachial 0 cm 39 cm (Active)  Indication for Insertion or Continuance of Line Vasoactive infusions 01/15/24 0928  Exposed Catheter (cm) 0 cm 01/15/24 0928  Site Assessment Clean, Dry, Intact 01/15/24 0928  Lumen #1 Status Flushed;Saline locked;Blood return noted 01/15/24 0928  Lumen #2 Status Flushed;Saline locked;Blood return noted 01/15/24 0928  Dressing Type Transparent;Securing device 01/15/24 9071  Dressing Status Antimicrobial disc/dressing in place 01/15/24 9071  Line Care Connections checked and tightened 01/15/24 9071  Line Adjustment (NICU/IV Team Only) No 01/15/24 0928  Dressing Intervention New dressing;Adhesive placed at insertion site (IV team only) 01/15/24 0928  Dressing Change Due 01/22/24 01/15/24 0928       Leita Shipper 01/15/2024, 9:30 AM

## 2024-01-15 NOTE — Progress Notes (Signed)
 Patient's RN called the after-hours service because the patient was continuing to be hypotensive.  When talking to the nurse she rechecked the blood pressure and it was 88/60's.  Recommend holding the patient's metoprolol  if the blood pressure remains soft.  The RN reported that Dr. Okey told her to hold the Lasix  and milrinone .  Recommended doing this if that is what Dr. Okey instructed her to do.  SignedMorse Clause, PA-C 01/15/2024, 6:57 PM

## 2024-01-15 NOTE — Progress Notes (Signed)
 Coox 44.4.  Discussed with advanced heart failure.  Will start milrinone  at 0.25.  Need to get CVP.  Start Lasix  drip 20 had an hour.  For now we will hold off on consult as this is likely a palliative measure to bridge him to his GI procedure.

## 2024-01-15 NOTE — Discharge Instructions (Signed)
 Cody Guzman

## 2024-01-15 NOTE — Evaluation (Signed)
 Physical Therapy Evaluation Patient Details Name: Cody Guzman MRN: [id number] DOB: [birth year] Today's Date: 01/15/2024  History of Present Illness  Pt is 58 yo presenting to [facility] on 9/8 due to upper abdominal discomfort/melanotic stools. Found to have upper GIB with acute blood loss anemia. PMHx: CAD, ICM, non-hodgkin's lymphoma in remmision, migraines, CKD2, CVA, HFrEF s/p ICD placement.  Clinical Impression  Pt is currently presenting as CGA for sit to stand and was CGA for gait progressing to Mod I without an AD during 200 ft of gait. Pt is limited by poor activity tolerance and dyspnea. Due to pt current functional status, home set up and available assistance at home no recommended skilled physical therapy services at this time on discharge from acute care hospital setting. Will continue to follow in acute setting in order to ensure that pt returns home with decreased risk for falls, injury, re-hospitalization and improved activity tolerance.          If plan is discharge home, recommend the following: Assist for transportation;Assistance with cooking/housework;Other (comment);Help with stairs or ramp for entrance (as needed)     Equipment Recommendations None recommended by PT     Functional Status Assessment Patient has had a recent decline in their functional status and demonstrates the ability to make significant improvements in function in a reasonable and predictable amount of time.     Precautions / Restrictions Precautions Precautions: Fall Recall of Precautions/Restrictions: Intact Restrictions Weight Bearing Restrictions Per Provider Order: No      Mobility  Bed Mobility Overal bed mobility: Independent    Transfers Overall transfer level: Needs assistance Equipment used: Rolling walker (2 wheels) Transfers: Sit to/from Stand Sit to Stand: Contact guard assist           General transfer comment: Initially slow guarded movements, verbal cues for safe hand  placement and CGA for safety.    Ambulation/Gait Ambulation/Gait assistance: Contact guard assist, Modified independent (Device/Increase time) Gait Distance (Feet): 200 Feet Assistive device: Rolling walker (2 wheels), None Gait Pattern/deviations: Step-through pattern, Decreased stride length Gait velocity: decreased Gait velocity interpretation: 1.31 - 2.62 ft/sec, indicative of limited community ambulator   General Gait Details: initially pt with guarded partial step through gait pattern with short shuffling steps. As pt progressed pt then improved step length and placed RW to side. Able to ambulate without AD with improved step through reciprocal gait pattern. Limited by fatigue and dyspnea. O2 sats with poor pleth reading stating 71% on room air. With good reading up to the 90's.     Balance Overall balance assessment: Mild deficits observed, not formally tested       Pertinent Vitals/Pain Pain Assessment Pain Assessment: Faces Faces Pain Scale: Hurts little more Pain Location: abodmen Pain Descriptors / Indicators: Aching Pain Intervention(s): Limited activity within patient's tolerance, Monitored during session    Home Living Family/patient expects to be discharged to:: Private residence Living Arrangements: Other relatives (nephew) Available Help at Discharge: Family;Available PRN/intermittently Type of Home: Other(Comment) (condo) Home Access: Stairs to enter Entrance Stairs-Rails: Right;Left;Can reach both Entrance Stairs-Number of Steps: 9   Home Layout: One level Home Equipment: Cane - single point;Shower seat - built in      Prior Function Prior Level of Function : Independent/Modified Independent;Working/employed;Driving;History of Falls (last six months)             Mobility Comments: occasional use of SPC with 2 falls reported in the past 6 months. ADLs Comments: Ind/Mod I with ADLs and IADLs. 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      Extremity/Trunk Assessment   Upper Extremity Assessment Upper Extremity Assessment: Defer to OT evaluation    Lower Extremity Assessment Lower Extremity Assessment: Overall WFL for tasks assessed    Cervical / Trunk Assessment Cervical / Trunk Assessment: Normal  Communication   Communication Communication: No apparent difficulties    Cognition Arousal: Alert Behavior During Therapy: WFL for tasks assessed/performed   PT - Cognitive impairments: No apparent impairments     Following commands: Intact       Cueing Cueing Techniques: Verbal cues     General Comments General comments (skin integrity, edema, etc.): BP 77/59 supine, 98/75 sitting, 84/71 standing, 105/74 after gait. Pt reports dizziness with transitions. RN aware.        Assessment/Plan    PT Assessment Patient needs continued PT services  PT Problem List Decreased activity tolerance;Decreased balance       PT Treatment Interventions DME instruction;Gait training;Stair training;Functional mobility training;Therapeutic activities;Therapeutic exercise;Patient/family education;Balance training    PT Goals (Current goals can be found in the Care Plan section)  Acute Rehab PT Goals Patient Stated Goal: to return home. PT Goal Formulation: With patient Time For Goal Achievement: 01/29/24 Potential to Achieve Goals: Good    Frequency Min 1X/week        AM-PAC PT 6 Clicks Mobility  Outcome Measure Help needed turning from your back to your side while in a flat bed without using bedrails?: None Help needed moving from lying on your back to sitting on the side of a flat bed without using bedrails?: None Help needed moving to and from a bed to a chair (including a wheelchair)?: A Little Help needed standing up from a chair using your arms (e.g., wheelchair or bedside chair)?: A Little Help needed to walk in hospital room?: A Little Help needed climbing 3-5 steps with a railing? : A Little 6 Click  Score: 20    End of Session Equipment Utilized During Treatment: Gait belt Activity Tolerance: Patient tolerated treatment well Patient left: in bed;with call bell/phone within reach;with nursing/sitter in room Nurse Communication: Mobility status PT Visit Diagnosis: Unsteadiness on feet (R26.81)    Time: 8645-8585 PT Time Calculation (min) (ACUTE ONLY): 20 min   Charges:   PT Evaluation $PT Eval Low Complexity: 1 Low   PT General Charges $$ ACUTE PT VISIT: 1 Visit       Dorothyann Maier, DPT, CLT  Acute Rehabilitation Services Office: [phone] (Secure chat preferred)   Dorothyann VEAR Maier 01/15/2024, 2:26 PM

## 2024-01-16 DIAGNOSIS — Z7189 Other specified counseling: Secondary | ICD-10-CM | POA: Diagnosis not present

## 2024-01-16 DIAGNOSIS — Z515 Encounter for palliative care: Secondary | ICD-10-CM | POA: Diagnosis not present

## 2024-01-16 DIAGNOSIS — I059 Rheumatic mitral valve disease, unspecified: Secondary | ICD-10-CM

## 2024-01-16 DIAGNOSIS — I480 Paroxysmal atrial fibrillation: Secondary | ICD-10-CM | POA: Diagnosis not present

## 2024-01-16 DIAGNOSIS — Z95810 Presence of automatic (implantable) cardiac defibrillator: Secondary | ICD-10-CM | POA: Diagnosis not present

## 2024-01-16 DIAGNOSIS — I5023 Acute on chronic systolic (congestive) heart failure: Secondary | ICD-10-CM | POA: Insufficient documentation

## 2024-01-16 DIAGNOSIS — F1411 Cocaine abuse, in remission: Secondary | ICD-10-CM

## 2024-01-16 DIAGNOSIS — I428 Other cardiomyopathies: Secondary | ICD-10-CM | POA: Diagnosis not present

## 2024-01-16 DIAGNOSIS — I4891 Unspecified atrial fibrillation: Secondary | ICD-10-CM | POA: Diagnosis not present

## 2024-01-16 DIAGNOSIS — I361 Nonrheumatic tricuspid (valve) insufficiency: Secondary | ICD-10-CM

## 2024-01-16 DIAGNOSIS — I509 Heart failure, unspecified: Secondary | ICD-10-CM

## 2024-01-16 DIAGNOSIS — K922 Gastrointestinal hemorrhage, unspecified: Secondary | ICD-10-CM | POA: Diagnosis not present

## 2024-01-16 DIAGNOSIS — I2510 Atherosclerotic heart disease of native coronary artery without angina pectoris: Secondary | ICD-10-CM

## 2024-01-16 DIAGNOSIS — F1210 Cannabis abuse, uncomplicated: Secondary | ICD-10-CM

## 2024-01-16 DIAGNOSIS — D61818 Other pancytopenia: Secondary | ICD-10-CM

## 2024-01-16 LAB — BASIC METABOLIC PANEL WITH GFR
Anion gap: 11 (ref 5–15)
BUN: 21 mg/dL — ABNORMAL HIGH (ref 6–20)
CO2: 18 mmol/L — ABNORMAL LOW (ref 22–32)
Calcium: 9.1 mg/dL (ref 8.9–10.3)
Chloride: 102 mmol/L (ref 98–111)
Creatinine, Ser: 1.84 mg/dL — ABNORMAL HIGH (ref 0.61–1.24)
GFR, Estimated: 41 mL/min — ABNORMAL LOW (ref >60)
Glucose, Bld: 96 mg/dL (ref 70–99)
Potassium: 3.6 mmol/L (ref 3.5–5.1)
Sodium: 131 mmol/L — ABNORMAL LOW (ref 135–145)

## 2024-01-16 LAB — COOXEMETRY PANEL
Carboxyhemoglobin: 1.1 % (ref 0.5–1.5)
Carboxyhemoglobin: 1.7 % — ABNORMAL HIGH (ref 0.5–1.5)
Methemoglobin: 1 % (ref 0.0–1.5)
Methemoglobin: <0.7 % (ref 0.0–1.5)
O2 Saturation: 55.2 %
O2 Saturation: 58.6 %
Total hemoglobin: 8.2 g/dL — ABNORMAL LOW (ref 12.0–16.0)
Total hemoglobin: 8.3 g/dL — ABNORMAL LOW (ref 12.0–16.0)

## 2024-01-16 LAB — MAGNESIUM: Magnesium: 1.8 mg/dL (ref 1.7–2.4)

## 2024-01-16 LAB — CBC
HCT: 25.9 % — ABNORMAL LOW (ref 39.0–52.0)
Hemoglobin: 7.6 g/dL — ABNORMAL LOW (ref 13.0–17.0)
MCH: 20 pg — ABNORMAL LOW (ref 26.0–34.0)
MCHC: 29.3 g/dL — ABNORMAL LOW (ref 30.0–36.0)
MCV: 68.2 fL — ABNORMAL LOW (ref 80.0–100.0)
Platelets: 133 K/uL — ABNORMAL LOW (ref 150–400)
RBC: 3.8 MIL/uL — ABNORMAL LOW (ref 4.22–5.81)
RDW: 22 % — ABNORMAL HIGH (ref 11.5–15.5)
WBC: 3.7 K/uL — ABNORMAL LOW (ref 4.0–10.5)
nRBC: 0 % (ref 0.0–0.2)

## 2024-01-16 LAB — BRAIN NATRIURETIC PEPTIDE: B Natriuretic Peptide: 812 pg/mL — ABNORMAL HIGH (ref 0.0–100.0)

## 2024-01-16 LAB — LACTIC ACID, PLASMA: Lactic Acid, Venous: 1.5 mmol/L (ref 0.5–1.9)

## 2024-01-16 MED ORDER — POTASSIUM CHLORIDE CRYS ER 20 MEQ PO TBCR
40.0000 meq | EXTENDED_RELEASE_TABLET | Freq: Once | ORAL | Status: AC
  Administered 2024-01-16: 40 meq via ORAL
  Filled 2024-01-16: qty 2

## 2024-01-16 MED ORDER — DIGOXIN 0.25 MG/ML IJ SOLN
0.2500 mg | Freq: Once | INTRAMUSCULAR | Status: AC
  Administered 2024-01-16: 0.25 mg via INTRAVENOUS
  Filled 2024-01-16: qty 1

## 2024-01-16 MED ORDER — DIGOXIN 0.25 MG/ML IJ SOLN
0.1250 mg | Freq: Once | INTRAMUSCULAR | Status: AC
  Administered 2024-01-16: 0.125 mg via INTRAVENOUS
  Filled 2024-01-16 (×2): qty 0.5

## 2024-01-16 NOTE — Progress Notes (Signed)
 Daily Progress Note   Patient Name: Cody Guzman       Date: 01/16/2024 DOB: 05-03-1966  Age: 58 y.o. MRN#: [id number] Attending Physician: Dino Antu, MD Primary Care Physician: Theotis Haze ORN, NP Admit Date: 01/11/2024  Reason for Consultation/Follow-up: Establishing goals of care  Subjective: Medical records reviewed including progress notes, labs, and imaging. Patient assessed at the bedside.  He had a difficult night, did not rest well.  No visitors were present during my assessment.  Created space and opportunity for patient's thoughts and feelings on patient's current illness.  I provided him with update on my phone conversation with his significant other.  He conveyed the history of back-and-forth discussions they have had regarding his health over the years, with his ongoing concerns and dislike of her talking to him in a certain manner.  They both now have a better understanding of the severity of his illness and can at least agree on the importance of his adherence to recommendations. [name] shares that much like yesterday, quantity of life is very much important to him.    I provided with education and counseling on the progressive nature of heart failure, with increased risk of further decline and compensation with the more exacerbations that occur.  He can understand how it is difficult for some people to cope with this serious illness.  He does not want to state us  and tells me he would rather refer to people who have difficulty coping as them.  He remains interested in completing advanced directives and will discuss further with his significant other over the weekend.  No other needs at this time.  Questions and concerns addressed. PMT will continue to support holistically.   Length of Stay: 3   Physical Exam Vitals  and nursing note reviewed.  Constitutional:      General: He is not in acute distress. HENT:     Head: Normocephalic and atraumatic.  Cardiovascular:     Rate and Rhythm: Normal rate.  Pulmonary:     Effort: Pulmonary effort is normal.  Skin:    General: Skin is dry.  Neurological:     Mental Status: He is alert and oriented to person, place, and time.  Psychiatric:        Mood and Affect: Mood normal.        Behavior: Behavior normal.             Vital Signs: BP (!) 82/55 (BP Location: Left Arm)   Pulse 82   Temp (!) 97.5 F (36.4 C) (Oral)   Resp (!) 22   Wt 85.8 kg  SpO2 100%   BMI 28.76 kg/m  SpO2: SpO2: 100 % O2 Device: O2 Device: Room Air O2 Flow Rate:        Palliative Assessment/Data: 70%   Palliative Care Assessment & Plan   Patient Profile: 58 y.o. male  with past medical history of cardiomyopathy, prior stroke, A-fib, non-Hodgkin's lymphoma in remission, CAD admitted on 01/11/2024 with abdominal pain radiating into his chest for a few days as well as black stools.    Patient was admitted with acute GI bleeding and acute blood loss anemia, A-fib with RVR, AKI.  GIB thought to be related to PUD.  Due to his biventricular heart failure/cardiomyopathy, GI recommended optimizing cardiac status before EGD.  EF is 25 to 30% per echo on 9/10.  Patient's serious illness further complicated by incidental finding of early cirrhosis on CT and minimally elevated ammonia. PMT has been consulted to assist with goals of care conversation.  Assessment: Goals of care conversation Acute decompensated biventricular heart failure Concern for upper GI bleed ABLA Nonischemic cardiomyopathy  Recommendations/Plan: Continue full code/full scope treatment Patient's goals of care are clear for as much quantity of life as possible and all necessary interventions to prolong his life Patient is open to completion of advance directives during this hospital stay, spiritual care  previously consulted and will need availability of a notary Psychosocial and emotional support provided PMT will continue to follow and support intermittently   Prognosis:  Poor long-term prognosis  Discharge Planning: Home   Care plan was discussed with patient, RN          Mickle SHAUNNA Fell, PA-C  Palliative Medicine Team Team phone # [phone]  Thank you for allowing the Palliative Medicine Team to assist in the care of this patient. Please utilize secure chat with additional questions, if there is no response within 30 minutes please call the above phone number.  Palliative Medicine Team providers are available by phone from 7am to 7pm daily and can be reached through the team cell phone.  Should this patient require assistance outside of these hours, please call the patient's attending physician.     Time Total: 35  Visit consisted of counseling and education dealing with the complex and emotionally intense issues of symptom management and palliative care in the setting of serious and potentially life-threatening illness. Greater than 50% of this time was spent counseling and coordinating care related to the above assessment and plan.  Personally spent 35 minutes in patient care including extensive chart review (labs, imaging, progress/consult notes, vital signs), medically appropraite exam, discussed with treatment team, education to patient, family, and staff, documenting clinical information, medication review and management, coordination of care, and available advanced directive documents.

## 2024-01-16 NOTE — Progress Notes (Signed)
 PROGRESS NOTE    Cody Guzman  FMW:[id number] DOB: 1966/01/30 DOA: 01/11/2024 PCP: Theotis Haze ORN, NP   Brief Narrative:   58 y.o.  male with history of HFrEF-s/p ICD placement, atrial fibrillation, CVA, CKD stage IIIb-who presented to the hospital with several days history of upper abdominal discomfort/melanotic stools.  Patient was found to have upper GI bleeding with acute blood loss anemia. Cardiology, GI and Palliative teams consulted.  Assessment & Plan:  Principal Problem:   Upper GI bleed Active Problems:   Cardiomyopathy (HCC)   History of non-Hodgkin's lymphoma   HLD (hyperlipidemia)   Abdominal pain   Benign hypertension   Bipolar 1 disorder (HCC)   Gout   OSA (obstructive sleep apnea)   Atrial fibrillation with rapid ventricular response (HCC)   Acute renal failure superimposed on stage 3a chronic kidney disease (HCC)   Anemia   Iron deficiency anemia due to chronic blood loss   Paroxysmal atrial fibrillation (HCC)   Cardiac resynchronization therapy defibrillator (CRT-D) in place   Nonrheumatic tricuspid valve regurgitation   Marijuana abuse   History of cocaine abuse (HCC)   Atherosclerosis of native coronary artery of native heart without angina pectoris   Mitral valve disease   Pancytopenia (HCC)   Nonischemic cardiomyopathy (HCC)   Acute decompensated heart failure (HCC)    UGI bleed with ABLA Bleeding seems to have resolved-no BM since admission-Hb stable Suspicion for peptic ulcer-given upper abdominal discomfort Continue PPI GI holding off on EGD-apparently needs cardiac status to be more optimized. Continue to follow CBC.   Upper abdominal discomfort Given GI bleeding-could be peptic ulcer disease No significant abnormality seen on CT abdomen-lipase within normal limits-still tender in the epigastric area without any peritoneal signs Continue PPI/Carafate .     Minimally elevated troponins Trend is flat-in the setting of CHF   Acute  toxic metabolic encephalopathy Has been mostly sleepy yesterday-but more awake and alert Not exactly sure the exact etiology-probably multifactorial-suspicion for low flow state/CHF-with contributions from Seroquel /Topamax  and possibly mildly elevated ammonia levels See below regarding cardiology input regarding low flow state Have decreased Seroquel  dosage to 200 mg Started lactulose  Follow closely.   Chronic HFrEF/nonischemic cardiomyopathy-s/p ICD implantation Volume status stable Some concern for low flow state-cardiology planning to place PICC line and check Co. Oximetry-and perhaps short-term inotropic therapy to get him through GI procedures. Continue beta-blocker-dosage adjusted slightly due to soft blood pressure On midodrine  Since blood pressure remains soft- Jardiance /ARB/Aldactone /Demadex  remains on hold Cardiology following-per cardiology-not a long term candidate for advanced therapies-if he does indeed have low output heart failure-his overall prognosis is going to be poor.  Will get palliative care involved at this point.   PAF Rate controlled-continue amiodarone /metoprolol . Eliquis  on hold due to GI bleed.   History of CVA No obvious focal deficits Continue statin Resume Eliquis  when GI workup is complete.   CKD stage IIIb Close to baseline    Possible early cirrhosis noted on CT Incidental finding Minimally elevated ammonia levels-will start lactulose -to see if this may help with some amount of lethargy.   History of Hodgkin's lymphoma Reportedly in remission Outpatient oncology follow-up   Bipolar disorder Stable Per patient he does take 300 mg of Seroquel -given some amount of lethargy-Will reduce dosage to 200 mg.   Chronic daily headaches On Topamax -acknowledges that he has been on it for a while as well.   Gout No flare Allopurinol    BPH Flomax   DVT prophylaxis: SCDs Start: 01/12/24 0052  High risk of deterioration. Poor  Prognosis.Palliative  team consulted.     Code Status: Full Code Family Communication:  None at the bedside Status is: Inpatient Remains inpatient appropriate because: CHF, hypotension    Subjective:  He is on midodrine  drip. Denies chest pain. Cardiology on board.  Examination:  General exam: Appears calm and comfortable  Respiratory system: Clear to auscultation. Respiratory effort normal. Cardiovascular system: S1 & S2 heard, RRR. No JVD, murmurs, rubs, gallops or clicks. No pedal edema. Gastrointestinal system: Abdomen is nondistended, soft and nontender. No organomegaly or masses felt. Normal bowel sounds heard. Central nervous system: Alert and oriented. No focal neurological deficits. Extremities: Symmetric 5 x 5 power. Skin: No rashes, lesions or ulcers Psychiatry: Judgement and insight appear normal. Mood & affect appropriate.      Diet Orders (From admission, onward)     Start     Ordered   01/14/24 1403  Diet Heart Room service appropriate? Yes; Fluid consistency: Thin  Diet effective now       Question Answer Comment  Room service appropriate? Yes   Fluid consistency: Thin      01/14/24 1402            Objective: Vitals:   01/16/24 0611 01/16/24 0623 01/16/24 0800 01/16/24 1209  BP: 92/65  (!) 82/55 112/79  Pulse: 80 (!) 104 82 90  Resp: 20 (!) 25 (!) 22 (!) 27  Temp:   (!) 97.5 F (36.4 C) 97.6 F (36.4 C)  TempSrc:   Oral Oral  SpO2: 97% 95% 100% 100%  Weight:  85.8 kg      Intake/Output Summary (Last 24 hours) at 01/16/2024 1426 Last data filed at 01/16/2024 1320 Gross per 24 hour  Intake 34.57 ml  Output 480 ml  Net -445.43 ml   Filed Weights   01/15/24 0500 01/16/24 0623  Weight: 85.5 kg 85.8 kg    Scheduled Meds:  allopurinol   100 mg Oral Daily   amiodarone   100 mg Oral Daily   atorvastatin   80 mg Oral Daily   Chlorhexidine  Gluconate Cloth  6 each Topical Daily   digoxin   0.125 mg Intravenous Once   lactulose   10 g Oral BID   midodrine   5 mg Oral  TID WC   pantoprazole  (PROTONIX ) IV  40 mg Intravenous Q12H   QUEtiapine   200 mg Oral QHS   sodium chloride  flush  10-40 mL Intracatheter Q12H   sucralfate   1 g Oral TID WC & HS   tamsulosin   0.4 mg Oral QPC supper   topiramate   25 mg Oral Daily   Continuous Infusions:  furosemide  (LASIX ) 200 mg in dextrose  5 % 100 mL (2 mg/mL) infusion 20 mg/hr (01/16/24 1320)   milrinone  0.125 mcg/kg/min (01/16/24 1320)    Nutritional status     Body mass index is 28.76 kg/m.  Data Reviewed:   CBC: Recent Labs  Lab 01/13/24 0450 01/13/24 1748 01/14/24 0558 01/15/24 0409 01/16/24 0512  WBC 5.1 5.1 4.5 3.8* 3.7*  NEUTROABS 3.3  --   --   --   --   HGB 7.9* 8.2* 8.4* 7.9* 7.6*  HCT 26.1* 27.7* 28.0* 26.4* 25.9*  MCV 67.4* 68.6* 68.1* 67.2* 68.2*  PLT 159 147* 148* 122* 133*   Basic Metabolic Panel: Recent Labs  Lab 01/13/24 0450 01/14/24 0558 01/15/24 0409 01/15/24 1425 01/16/24 0512  NA 136 132* 133* 134* 131*  K 4.0 4.6 3.7 3.2* 3.6  CL 107 105 106 108 102  CO2 18* 17* 15* 15* 18*  GLUCOSE 100* 97 79 88 96  BUN 22* 21* 21* 18 21*  CREATININE 1.62* 1.66* 1.53* 1.41* 1.84*  CALCIUM  9.2 9.3 9.1 8.1* 9.1  MG  --   --   --   --  1.8   GFR: Estimated Creatinine Clearance: 45.5 mL/min (A) (by C-G formula based on SCr of 1.84 mg/dL (H)). Liver Function Tests: Recent Labs  Lab 01/12/24 0036 01/12/24 0800 01/15/24 1425  AST 77* 61* 32  ALT 56* 49* 34  ALKPHOS 121 104 110  BILITOT 5.5* 4.6* 2.6*  PROT 6.8 6.0* 5.7*  ALBUMIN 3.3* 2.8* 2.7*   Recent Labs  Lab 01/12/24 0036  LIPASE 31   Recent Labs  Lab 01/14/24 1622  AMMONIA 59*   Coagulation Profile: No results for input(s): INR, PROTIME in the last 168 hours. Cardiac Enzymes: No results for input(s): CKTOTAL, CKMB, CKMBINDEX, TROPONINI in the last 168 hours. BNP (last 3 results) No results for input(s): PROBNP in the last 8760 hours. HbA1C: No results for input(s): HGBA1C in the last 72  hours. CBG: Recent Labs  Lab 01/12/24 1658 01/12/24 2150  GLUCAP 125* 123*   Lipid Profile: No results for input(s): CHOL, HDL, LDLCALC, TRIG, CHOLHDL, LDLDIRECT in the last 72 hours. Thyroid  Function Tests: No results for input(s): TSH, T4TOTAL, FREET4, T3FREE, THYROIDAB in the last 72 hours. Anemia Panel: No results for input(s): VITAMINB12, FOLATE, FERRITIN, TIBC, IRON, RETICCTPCT in the last 72 hours. Sepsis Labs: Recent Labs  Lab 01/13/24 0450 01/13/24 0814 01/14/24 1622 01/14/24 1921 01/16/24 1132  PROCALCITON 0.45  --   --   --   --   LATICACIDVEN 1.3 1.3 2.4* 2.1* 1.5    No results found for this or any previous visit (from the past 240 hours).       Radiology Studies: DG Chest Port 1 View Result Date: 01/15/2024 EXAM: 1 VIEW XRAY OF THE CHEST 01/15/2024 10:00:00 AM COMPARISON: 01/13/2024 CLINICAL HISTORY: PICC (peripherally inserted central catheter) in place [id number]. PICC placement. FINDINGS: LINES, TUBES AND DEVICES: Right upper extremity PICC in place with distal tip at superior cavoatrial junction. Stable left chest wall ICD. LUNGS AND PLEURA: Mild pulmonary edema is new from prior. No focal pulmonary opacity. No pleural effusion. No pneumothorax. HEART AND MEDIASTINUM: Stable cardiomegaly. No acute abnormality of the cardiac and mediastinal silhouettes. BONES AND SOFT TISSUES: No acute osseous abnormality. IMPRESSION: 1. Mild pulmonary edema, new from prior. 2. Stable cardiomegaly. 3. Right upper extremity PICC in place with distal tip at superior cavoatrial junction. Electronically signed by: Donnice Mania MD 01/15/2024 10:34 AM EDT RP Workstation: [id number]   US  EKG SITE RITE Result Date: 01/14/2024 If Site Rite image not attached, placement could not be confirmed due to current cardiac rhythm.       LOS: 3 days   Time spent= 42 mins    Deliliah Room, MD [facility]  If 7PM-7AM, please contact  night-coverage  01/16/2024, 2:26 PM

## 2024-01-16 NOTE — Progress Notes (Signed)
 Progress Note  Patient Name: Cody Guzman Date of Encounter: 01/16/2024 [facility] Cardiologist: Vina Gull, MD   Brief review of electronic medical records: Presents to [facility] complaining of abdominal pain and chest pain. In the ER was noted to be in A-fib with RVR, responded to amiodarone  and IV Cardizem  Hemoglobin was found to be 8.6 which had recently dropped from 13.1 (February 2025) Recent concern for possible acute GI bleed.  Admitted to medicine for possible GI bleed secondary to acute blood loss anemia.  GI consulted for further evaluation.  Given his heart failure symptoms and arrhythmia GI requested cardiology consult to optimize him clinically prior to considering endoscopy.  Interval Summary   Resting in bed comfortably. Denies chest pain or heart failure symptoms. No family at bedside  Vital Signs Vitals:   01/16/24 0500 01/16/24 0600 01/16/24 0611 01/16/24 0623  BP: 93/75 104/73 92/65   Pulse: 79 84 80 (!) 104  Resp: 19 20 20  (!) 25  Temp:      TempSrc:      SpO2: 100% 97% 97% 95%  Weight:    85.8 kg    Intake/Output Summary (Last 24 hours) at 01/16/2024 0814 Last data filed at 01/15/2024 1824 Gross per 24 hour  Intake 21.68 ml  Output --  Net 21.68 ml      01/16/2024    6:23 AM 01/15/2024    5:00 AM 08/17/2023    4:16 PM  Last 3 Weights  Weight (lbs) 189 lb 2.5 oz 188 lb 9.6 oz 192 lb  Weight (kg) 85.8 kg 85.548 kg 87.091 kg      Telemetry/ECG  Currently sinus, intermittent episodes of A-fib with controlled ventricular rate, as well as paced rhythm- Personally Reviewed  Physical Exam  GEN: No acute distress.  Resting in bed comfortably, age-appropriate Neck: + JVD Cardiac: RRR, no holosystolic murmur heard at the apex, and left lower sternal border. Respiratory: Clear to auscultation bilaterally. GI: Soft, nontender, non-distended  MS: No edema  Assessment & Plan  Acute decompensated biventricular heart failure, concerning  for low output state Nonischemic cardiomyopathy Status post CRT-D Echo September 2023: LVEF 20-25%, global hypokinesis, RV function moderately reduced size mildly dilated, mild MR, moderate to severe TR Echo September 2025: EF 25 to 30% with global hypokinesis. Moderately reduced RV function. Moderate MR. RA pressure 15 millimeters of mercury, IVC is dilated at 4.1 cm Last device interrogation, performed during earlier this hospitalization.  Records unavailable for review Home meds: Jardiance  10 mg daily, irbesartan  37.5 mg daily, Toprol  50 mg daily, spironolactone  25 mg daily, torsemide  20 mg daily  GDMT limited due to hypotension Placed on midodrine  for blood pressure support, earlier this admission-currently held due to soft blood pressures Currently both milrinone  and Lasix  drips have been held due to intermittent hypotension Discontinue Toprol -XL 25 mg p.o. daily due to low EF Start digoxin  250 mcg x 1 IV push, 6 hours later at 125 mcg IV push If no significant improvement in MAP, will add Levophed. Net IO Since Admission: -1,226.32 mL [01/16/24 0821] CVP  Lactic acid:1.3-2.8, 2.1 on 01/14/24, recheck LA BNP 720 (01/13/2024), recheck BNP Co-Ox: 44.4,56.7,55.2 (most recent) May not be ideal candidate for advanced heart failure therapies.  Advanced heart failure was reached out on 01/15/2024 recommended milrinone  drip, continue to monitor oxygenation and volume status, and Lasix  drip.  Heme: Pancytopenic, management per primary team  Preprocedure risk stratification for EGD Indication: Concern for GI bleed Currently optimizing his volume status and  rhythm Further risk stratification is to follow  Valvular heart disease History of tricuspid regurgitation dating back to 2020 Current echocardiogram notes both mitral and tricuspid regurgitation. IVC dilated at 4.1 cm with less than 50% reduction suggestive of estimated RAP of 15 mmHg Working and diuresis as mentioned  above  Paroxysmal atrial fibrillation/flutter Long-term oral anticoagulation Home meds: Eliquis  5 mg twice daily, Toprol  50 mg daily, amiodarone  100 mg daily  -First noted 06/2020.  Underwent TEE/DCCV.  Amiodarone  started -Readmitted 08/2020 with CVA and A-fib/flutter.  Converted with IV amiodarone  -Not a candidate for ablation per Dr. Fernande Anticoagulation held secondary to concerns for GI bleed.  Plans on restarting once cleared by GI Currently on p.o. amiodarone  100 mg daily.   Review of his liver findings may be secondary to passive congestion from elevated right-sided pressures and severe TR.  For rate and rhythm control strategy will continue low-dose amiodarone  that he was on at home.  Close monitoring of LFTs.    Coronary artery disease: Reports having 2 myocardial infarctions, in 2014 and 2016 at outside facility No recent ischemic evaluation per our EMR Continue atorvastatin  80 mg p.o. daily. Denies anginal chest pain.  Per primary: Hypertension with chronic kidney disease stage IIIb OSA Marijuana use History of cocaine use Noncompliance: With medical therapy as well as outpatient follow-up    Risk Assessment/Calculations:   Click Here to Calculate/Change CHADS2VASc Score The patient's CHADS2-VASc score is 5, indicating a 7.2% annual risk of stroke.   CHF History: Yes HTN History: Yes Diabetes History: No Stroke History: Yes Vascular Disease History: Yes    For questions or updates, please contact [facility] Please consult [email or web address] for contact info under        CRITICAL CARE TIME Performed by: Madonna Large, DO   Total critical care time: 35 minutes -acute biventricular heart failure with concerns for low output state.   Critical care time was exclusive of separately billable procedures and treating other patients.  Critical care was necessary to treat or prevent imminent or life-threatening deterioration.  Critical care was time spent  personally by me on the following activities: Review of electronic medical records, development of treatment plan with patient as well as nursing, evaluation of patient's response to treatment, examination of patient, obtaining history from patient or surrogate, ordering and performing treatments and interventions, ordering and review of laboratory studies, ordering and review of radiographic studies, pulse oximetry and re-evaluation of patient's condition.  Signed, Madonna Large, DO

## 2024-01-16 NOTE — Plan of Care (Signed)

## 2024-01-16 NOTE — Progress Notes (Signed)
 Pt noted with BP sustaining SBP>90 mmHg. Currently 92/65. Pls see flowsheet. Milrinone  restarted.

## 2024-01-17 DIAGNOSIS — Z95810 Presence of automatic (implantable) cardiac defibrillator: Secondary | ICD-10-CM | POA: Diagnosis not present

## 2024-01-17 DIAGNOSIS — I509 Heart failure, unspecified: Secondary | ICD-10-CM | POA: Diagnosis not present

## 2024-01-17 DIAGNOSIS — D61818 Other pancytopenia: Secondary | ICD-10-CM | POA: Diagnosis not present

## 2024-01-17 DIAGNOSIS — K922 Gastrointestinal hemorrhage, unspecified: Secondary | ICD-10-CM | POA: Diagnosis not present

## 2024-01-17 DIAGNOSIS — I428 Other cardiomyopathies: Secondary | ICD-10-CM | POA: Diagnosis not present

## 2024-01-17 LAB — BASIC METABOLIC PANEL WITH GFR
Anion gap: 13 (ref 5–15)
BUN: 21 mg/dL — ABNORMAL HIGH (ref 6–20)
CO2: 23 mmol/L (ref 22–32)
Calcium: 9.9 mg/dL (ref 8.9–10.3)
Chloride: 97 mmol/L — ABNORMAL LOW (ref 98–111)
Creatinine, Ser: 1.69 mg/dL — ABNORMAL HIGH (ref 0.61–1.24)
GFR, Estimated: 46 mL/min — ABNORMAL LOW (ref >60)
Glucose, Bld: 116 mg/dL — ABNORMAL HIGH (ref 70–99)
Potassium: 3.3 mmol/L — ABNORMAL LOW (ref 3.5–5.1)
Sodium: 133 mmol/L — ABNORMAL LOW (ref 135–145)

## 2024-01-17 LAB — COOXEMETRY PANEL
Carboxyhemoglobin: 2.2 % — ABNORMAL HIGH (ref 0.5–1.5)
Methemoglobin: <0.7 % (ref 0.0–1.5)
O2 Saturation: 70.7 %
Total hemoglobin: 9.6 g/dL — ABNORMAL LOW (ref 12.0–16.0)

## 2024-01-17 LAB — MAGNESIUM: Magnesium: 1.8 mg/dL (ref 1.7–2.4)

## 2024-01-17 MED ORDER — MILRINONE LACTATE IN DEXTROSE 20-5 MG/100ML-% IV SOLN: 0.1250 ug/kg/min | INTRAVENOUS | Status: DC

## 2024-01-17 MED ORDER — MAGNESIUM SULFATE 4 GM/100ML IV SOLN
4.0000 g | Freq: Once | INTRAVENOUS | Status: AC
  Administered 2024-01-17: 4 g via INTRAVENOUS
  Filled 2024-01-17: qty 100

## 2024-01-17 MED ORDER — POTASSIUM CHLORIDE 20 MEQ PO PACK
40.0000 meq | PACK | Freq: Once | ORAL | Status: AC
  Administered 2024-01-17: 40 meq via ORAL
  Filled 2024-01-17: qty 2

## 2024-01-17 MED ORDER — METHOCARBAMOL 500 MG PO TABS
500.0000 mg | ORAL_TABLET | Freq: Four times a day (QID) | ORAL | Status: DC | PRN
  Administered 2024-01-17 – 2024-01-18 (×4): 500 mg via ORAL
  Filled 2024-01-17 (×5): qty 1

## 2024-01-17 MED ORDER — DIGOXIN 125 MCG PO TABS
0.1250 mg | ORAL_TABLET | Freq: Every day | ORAL | Status: DC
  Administered 2024-01-17 – 2024-01-21 (×5): 0.125 mg via ORAL
  Filled 2024-01-17 (×5): qty 1

## 2024-01-17 NOTE — Progress Notes (Signed)
 Progress Note  Patient Name: Cody Guzman Date of Encounter: 01/17/2024 [facility] Cardiologist: Vina Gull, MD   Brief review of electronic medical records: Presents to [facility] complaining of abdominal pain and chest pain. In the ER was noted to be in A-fib with RVR, responded to amiodarone  and IV Cardizem  Hemoglobin was found to be 8.6 which had recently dropped from 13.1 (February 2025) Recent concern for possible acute GI bleed.  Admitted to medicine for possible GI bleed secondary to acute blood loss anemia.  GI consulted for further evaluation.  Given his heart failure symptoms and arrhythmia GI requested cardiology consult to optimize him clinically prior to considering endoscopy.  Interval Summary   01/16/2024 - started milrinone  and lasix  drip (co-ox 58.6) and CVP  Co-ox this am 70.7  Excellent UOP over 24hr - documented 12L, RN confirmed accuracy  Sitting up in the bed. Denies anginal chest pain. Redness of breath improving. Complains of cramps.  Vital Signs Vitals:   01/17/24 0500 01/17/24 0715 01/17/24 0800 01/17/24 0842  BP: 100/75 97/75 115/86   Pulse: 81 79    Resp: 15 19    Temp:  97.7 F (36.5 C)    TempSrc:  Oral    SpO2: 99% 99%    Weight:    75.1 kg    Intake/Output Summary (Last 24 hours) at 01/17/2024 0859 Last data filed at 01/17/2024 0843 Gross per 24 hour  Intake 606.36 ml  Output 87824 ml  Net -11568.64 ml      01/17/2024    8:42 AM 01/16/2024    6:23 AM 01/15/2024    5:00 AM  Last 3 Weights  Weight (lbs) 165 lb 9.1 oz 189 lb 2.5 oz 188 lb 9.6 oz  Weight (kg) 75.1 kg 85.8 kg 85.548 kg      Telemetry/ECG  Sinus rhythm, intermittent paced rhythm- Personally Reviewed  Physical Exam  GEN: No acute distress.  Resting in bed comfortably, age-appropriate Neck: + JVD Cardiac: RRR, no holosystolic murmur heard at the apex, and left lower sternal border. Respiratory: Clear to auscultation bilaterally. GI: Soft,  nontender, non-distended  MS: No edema  Assessment & Plan  Acute decompensated biventricular heart failure, concerning for low output state Nonischemic cardiomyopathy Status post CRT-D Echo September 2023: LVEF 20-25%, global hypokinesis, RV function moderately reduced size mildly dilated, mild MR, moderate to severe TR Echo September 2025: EF 25 to 30% with global hypokinesis. Moderately reduced RV function. Moderate MR. RA pressure 15 millimeters of mercury, IVC is dilated at 4.1 cm Last device interrogation, performed during earlier this hospitalization.  Records unavailable for review Home meds: Jardiance  10 mg daily, irbesartan  37.5 mg daily, Toprol  50 mg daily, spironolactone  25 mg daily, torsemide  20 mg daily  GDMT limited due to hypotension Placed on midodrine  for blood pressure support, earlier this admission.  Started both milrinone  and Lasix  drips 01/16/2024  Discontinued Toprol -XL 01/16/2024 due to low EF Started digoxin  250 mcg x 1 IV push, 6 hours later at 125 mcg IV push - continue Digoxin  0.125mcg po qday due to RV dysfunction and will check dig level in the morning.  Net IO Since Admission: -12,794.96 mL [01/17/24 0859] CVP (01/16/2024), 10 mmHG (01/17/2024) Lactic acid:1.3-2.8, 2.1. 1.5 on 01/16/24 BNP 720 (01/13/2024), 812 (01/16/2024) Co-Ox: 44.4,56.7,55.2 (09/13), 70.7% today.  May not be ideal candidate for advanced heart failure therapies.  Advanced heart failure was reached out on 01/15/2024 recommended milrinone  drip, continue to monitor oxygenation and volume status, and Lasix   drip. Anticipate weaning off milrinone  and Lasix  drip in the next 24 hours. Patient due for oral potassium supplementation. Added IV magnesium  sulfate 4 g IV piggyback.  Heme: Pancytopenic, management per primary team  Preprocedure risk stratification for EGD Indication: Concern for GI bleed Currently optimizing his volume status and rhythm Further risk stratification is to  follow  Valvular heart disease History of tricuspid regurgitation dating back to 2020 Current echocardiogram notes both mitral and tricuspid regurgitation. IVC dilated at 4.1 cm with less than 50% reduction suggestive of estimated RAP of 15 mmHg Working and diuresis as mentioned above  Paroxysmal atrial fibrillation/flutter Long-term oral anticoagulation Home meds: Eliquis  5 mg twice daily, Toprol  50 mg daily, amiodarone  100 mg daily  -First noted 06/2020.  Underwent TEE/DCCV.  Amiodarone  started -Readmitted 08/2020 with CVA and A-fib/flutter.  Converted with IV amiodarone  -Not a candidate for ablation per Dr. Fernande Anticoagulation held secondary to concerns for GI bleed.  Plans on restarting once cleared by GI Currently on p.o. amiodarone  100 mg daily.   Review of his liver findings may be secondary to passive congestion from elevated right-sided pressures and severe TR.  For rate and rhythm control strategy will continue low-dose amiodarone  that he was on at home.  Close monitoring of LFTs.    Coronary artery disease: Reports having 2 myocardial infarctions, in 2014 and 2016 at outside facility No recent ischemic evaluation per our EMR Continue atorvastatin  80 mg p.o. daily. Denies anginal chest pain.  Per primary: Hypertension with chronic kidney disease stage IIIb OSA Marijuana use History of cocaine use Noncompliance: With medical therapy as well as outpatient follow-up  Risk Assessment/Calculations:   Click Here to Calculate/Change CHADS2VASc Score The patient's CHADS2-VASc score is 5, indicating a 7.2% annual risk of stroke.   CHF History: Yes HTN History: Yes Diabetes History: No Stroke History: Yes Vascular Disease History: Yes  For questions or updates, please contact [facility] Please consult [email or web address] for contact info under   Signed, Jasim Harari, DO

## 2024-01-17 NOTE — Progress Notes (Addendum)
 Acute decompensated biventricular heart failure with low output state, cardiology is trying to optimize his cardiac status No ongoing active GI bleed He is not in any condition to undergo anesthesia or endoscopic procedures He is on PPI twice daily, recommend conservative management, please consult GI back if needed  K. Veena Phila Shoaf , MD [phone]

## 2024-01-17 NOTE — Progress Notes (Signed)
 PROGRESS NOTE    Cody Guzman  FMW:[id number] DOB: [birth year] DOA: 01/11/2024 PCP: Theotis Haze ORN, NP   Brief Narrative:   58 y.o.  male with history of HFrEF-s/p ICD placement, atrial fibrillation, CVA, CKD stage IIIb-who presented to the hospital with several days history of upper abdominal discomfort/melanotic stools.  Patient was found to have upper GI bleeding with acute blood loss anemia. Cardiology, GI and Palliative teams consulted.On midodrine  and lasix  drips. Symptoms improving.  Assessment & Plan:  Principal Problem:   Upper GI bleed Active Problems:   Cardiomyopathy (HCC)   History of non-Hodgkin's lymphoma   HLD (hyperlipidemia)   Abdominal pain   Benign hypertension   Bipolar 1 disorder (HCC)   Gout   OSA (obstructive sleep apnea)   Atrial fibrillation with rapid ventricular response (HCC)   Acute renal failure superimposed on stage 3a chronic kidney disease (HCC)   Anemia   Iron deficiency anemia due to chronic blood loss   Paroxysmal atrial fibrillation (HCC)   Cardiac resynchronization therapy defibrillator (CRT-D) in place   Nonrheumatic tricuspid valve regurgitation   Marijuana abuse   History of cocaine abuse (HCC)   Atherosclerosis of native coronary artery of native heart without angina pectoris   Mitral valve disease   Pancytopenia (HCC)   Nonischemic cardiomyopathy (HCC)   Acute decompensated heart failure (HCC)    UGI bleed with ABLA Bleeding seems to have resolved-no BM since admission-Hb stable Suspicion for peptic ulcer-given upper abdominal discomfort Continue PPI GI holding off on EGD-apparently needs cardiac status to be more optimized. Continue to follow CBC.   Upper abdominal discomfort Given GI bleeding-could be peptic ulcer disease No significant abnormality seen on CT abdomen-lipase within normal limits-still tender in the epigastric area without any peritoneal signs Continue PPI/Carafate .     Minimally elevated  troponins Trend is flat-in the setting of CHF   Acute toxic metabolic encephalopathy Has been mostly sleepy yesterday-but more awake and alert Not exactly sure the exact etiology-probably multifactorial-suspicion for low flow state/CHF-with contributions from Seroquel /Topamax  and possibly mildly elevated ammonia levels See below regarding cardiology input regarding low flow state Have decreased Seroquel  dosage to 200 mg Started lactulose  Follow closely.   Chronic HFrEF/nonischemic cardiomyopathy-s/p ICD implantation Monitor Co. Oximetry Continue beta-blocker-dosage adjusted slightly due to soft blood pressure On midodrine  and lasix  drip -Given IV digoxin  on 9/13 and is currently on oral digoxin . Monitor electrolytes and digoxin  level. Since blood pressure remains soft- Jardiance /ARB/Aldactone /Demadex  remains on hold Cardiology following-per cardiology-not a long term candidate for advanced therapies-if he does indeed have low output heart failure-his overall prognosis is going to be poor.   Palliative care on board.   PAF Rate controlled-continue amiodarone /metoprolol . Eliquis  on hold due to GI bleed.   History of CVA No obvious focal deficits Continue statin Resume Eliquis  when GI workup is complete.   CKD stage IIIb Close to baseline    Possible early cirrhosis noted on CT Incidental finding Minimally elevated ammonia levels-will start lactulose -to see if this may help with some amount of lethargy.   History of Hodgkin's lymphoma Reportedly in remission Outpatient oncology follow-up   Bipolar disorder Stable Per patient he does take 300 mg of Seroquel -given some amount of lethargy-Will reduce dosage to 200 mg.   Chronic daily headaches On Topamax -acknowledges that he has been on it for a while as well.   Gout No flare Allopurinol    BPH Flomax   DVT prophylaxis: SCDs Start: 01/12/24 0052  High risk of deterioration. Poor Prognosis.Palliative team  consulted.     Code Status: Full Code Family Communication:  None at the bedside Status is: Inpatient Remains inpatient appropriate because: CHF, hypotension    Subjective:  He is on midodrine  and lasix  drips. Complaining of generalized spasms. Denies chest pain or shortness of breath. He was hving breakfast.   Examination:  General exam: Appears calm and comfortable  Respiratory system: Clear to auscultation. Respiratory effort normal. Cardiovascular system: S1 & S2 heard, RRR. No JVD, murmurs, rubs, gallops or clicks. No pedal edema. Gastrointestinal system: Abdomen is nondistended, soft and nontender. No organomegaly or masses felt. Normal bowel sounds heard. Central nervous system: Alert and oriented. No focal neurological deficits. Extremities: Symmetric 5 x 5 power. Skin: No rashes, lesions or ulcers Psychiatry: Judgement and insight appear normal. Mood & affect appropriate.      Diet Orders (From admission, onward)     Start     Ordered   01/14/24 1403  Diet Heart Room service appropriate? Yes; Fluid consistency: Thin  Diet effective now       Question Answer Comment  Room service appropriate? Yes   Fluid consistency: Thin      01/14/24 1402            Objective: Vitals:   01/17/24 0500 01/17/24 0715 01/17/24 0800 01/17/24 0842  BP: 100/75 97/75 115/86   Pulse: 81 79    Resp: 15 19    Temp:  97.7 F (36.5 C)    TempSrc:  Oral    SpO2: 99% 99%    Weight:    75.1 kg    Intake/Output Summary (Last 24 hours) at 01/17/2024 0958 Last data filed at 01/17/2024 0843 Gross per 24 hour  Intake 606.36 ml  Output 87824 ml  Net -11568.64 ml   Filed Weights   01/15/24 0500 01/16/24 0623 01/17/24 0842  Weight: 85.5 kg 85.8 kg 75.1 kg    Scheduled Meds:  allopurinol   100 mg Oral Daily   amiodarone   100 mg Oral Daily   atorvastatin   80 mg Oral Daily   Chlorhexidine  Gluconate Cloth  6 each Topical Daily   digoxin   0.125 mg Oral Daily   lactulose   10 g Oral  BID   midodrine   5 mg Oral TID WC   pantoprazole  (PROTONIX ) IV  40 mg Intravenous Q12H   potassium chloride   40 mEq Oral Once   QUEtiapine   200 mg Oral QHS   sodium chloride  flush  10-40 mL Intracatheter Q12H   sucralfate   1 g Oral TID WC & HS   tamsulosin   0.4 mg Oral QPC supper   topiramate   25 mg Oral Daily   Continuous Infusions:  furosemide  (LASIX ) 200 mg in dextrose  5 % 100 mL (2 mg/mL) infusion 20 mg/hr (01/17/24 0500)   magnesium  sulfate bolus IVPB     milrinone  0.125 mcg/kg/min (01/17/24 0857)    Nutritional status     Body mass index is 25.17 kg/m.  Data Reviewed:   CBC: Recent Labs  Lab 01/13/24 0450 01/13/24 1748 01/14/24 0558 01/15/24 0409 01/16/24 0512  WBC 5.1 5.1 4.5 3.8* 3.7*  NEUTROABS 3.3  --   --   --   --   HGB 7.9* 8.2* 8.4* 7.9* 7.6*  HCT 26.1* 27.7* 28.0* 26.4* 25.9*  MCV 67.4* 68.6* 68.1* 67.2* 68.2*  PLT 159 147* 148* 122* 133*   Basic Metabolic Panel: Recent Labs  Lab 01/14/24 0558 01/15/24 0409 01/15/24 1425 01/16/24 0512 01/17/24 0500  NA 132* 133* 134* 131* 133*  K 4.6 3.7 3.2* 3.6 3.3*  CL 105 106 108 102 97*  CO2 17* 15* 15* 18* 23  GLUCOSE 97 79 88 96 116*  BUN 21* 21* 18 21* 21*  CREATININE 1.66* 1.53* 1.41* 1.84* 1.69*  CALCIUM  9.3 9.1 8.1* 9.1 9.9  MG  --   --   --  1.8 1.8   GFR: Estimated Creatinine Clearance: 45 mL/min (A) (by C-G formula based on SCr of 1.69 mg/dL (H)). Liver Function Tests: Recent Labs  Lab 01/12/24 0036 01/12/24 0800 01/15/24 1425  AST 77* 61* 32  ALT 56* 49* 34  ALKPHOS 121 104 110  BILITOT 5.5* 4.6* 2.6*  PROT 6.8 6.0* 5.7*  ALBUMIN 3.3* 2.8* 2.7*   Recent Labs  Lab 01/12/24 0036  LIPASE 31   Recent Labs  Lab 01/14/24 1622  AMMONIA 59*   Coagulation Profile: No results for input(s): INR, PROTIME in the last 168 hours. Cardiac Enzymes: No results for input(s): CKTOTAL, CKMB, CKMBINDEX, TROPONINI in the last 168 hours. BNP (last 3 results) No results for  input(s): PROBNP in the last 8760 hours. HbA1C: No results for input(s): HGBA1C in the last 72 hours. CBG: Recent Labs  Lab 01/12/24 1658 01/12/24 2150  GLUCAP 125* 123*   Lipid Profile: No results for input(s): CHOL, HDL, LDLCALC, TRIG, CHOLHDL, LDLDIRECT in the last 72 hours. Thyroid  Function Tests: No results for input(s): TSH, T4TOTAL, FREET4, T3FREE, THYROIDAB in the last 72 hours. Anemia Panel: No results for input(s): VITAMINB12, FOLATE, FERRITIN, TIBC, IRON, RETICCTPCT in the last 72 hours. Sepsis Labs: Recent Labs  Lab 01/13/24 0450 01/13/24 0814 01/14/24 1622 01/14/24 1921 01/16/24 1132  PROCALCITON 0.45  --   --   --   --   LATICACIDVEN 1.3 1.3 2.4* 2.1* 1.5    No results found for this or any previous visit (from the past 240 hours).       Radiology Studies: DG Chest Port 1 View Result Date: 01/15/2024 EXAM: 1 VIEW XRAY OF THE CHEST 01/15/2024 10:00:00 AM COMPARISON: 01/13/2024 CLINICAL HISTORY: PICC (peripherally inserted central catheter) in place [id number]. PICC placement. FINDINGS: LINES, TUBES AND DEVICES: Right upper extremity PICC in place with distal tip at superior cavoatrial junction. Stable left chest wall ICD. LUNGS AND PLEURA: Mild pulmonary edema is new from prior. No focal pulmonary opacity. No pleural effusion. No pneumothorax. HEART AND MEDIASTINUM: Stable cardiomegaly. No acute abnormality of the cardiac and mediastinal silhouettes. BONES AND SOFT TISSUES: No acute osseous abnormality. IMPRESSION: 1. Mild pulmonary edema, new from prior. 2. Stable cardiomegaly. 3. Right upper extremity PICC in place with distal tip at superior cavoatrial junction. Electronically signed by: Donnice Mania MD 01/15/2024 10:34 AM EDT RP Workstation: [id number]        LOS: 4 days   Time spent= 42 mins    Deliliah Room, MD [facility]  If 7PM-7AM, please contact night-coverage  01/17/2024, 9:58 AM

## 2024-01-18 DIAGNOSIS — I509 Heart failure, unspecified: Secondary | ICD-10-CM | POA: Diagnosis not present

## 2024-01-18 DIAGNOSIS — I4891 Unspecified atrial fibrillation: Secondary | ICD-10-CM | POA: Diagnosis not present

## 2024-01-18 DIAGNOSIS — I5023 Acute on chronic systolic (congestive) heart failure: Secondary | ICD-10-CM | POA: Diagnosis not present

## 2024-01-18 DIAGNOSIS — R570 Cardiogenic shock: Secondary | ICD-10-CM | POA: Diagnosis not present

## 2024-01-18 DIAGNOSIS — Z7189 Other specified counseling: Secondary | ICD-10-CM | POA: Diagnosis not present

## 2024-01-18 DIAGNOSIS — K922 Gastrointestinal hemorrhage, unspecified: Secondary | ICD-10-CM | POA: Diagnosis not present

## 2024-01-18 LAB — BASIC METABOLIC PANEL WITH GFR
Anion gap: 16 — ABNORMAL HIGH (ref 5–15)
BUN: 22 mg/dL — ABNORMAL HIGH (ref 6–20)
CO2: 26 mmol/L (ref 22–32)
Calcium: 10.1 mg/dL (ref 8.9–10.3)
Chloride: 87 mmol/L — ABNORMAL LOW (ref 98–111)
Creatinine, Ser: 1.76 mg/dL — ABNORMAL HIGH (ref 0.61–1.24)
GFR, Estimated: 44 mL/min — ABNORMAL LOW (ref >60)
Glucose, Bld: 115 mg/dL — ABNORMAL HIGH (ref 70–99)
Potassium: 3.8 mmol/L (ref 3.5–5.1)
Sodium: 129 mmol/L — ABNORMAL LOW (ref 135–145)

## 2024-01-18 LAB — CBC
HCT: 35.3 % — ABNORMAL LOW (ref 39.0–52.0)
Hemoglobin: 10.6 g/dL — ABNORMAL LOW (ref 13.0–17.0)
MCH: 20.1 pg — ABNORMAL LOW (ref 26.0–34.0)
MCHC: 30 g/dL (ref 30.0–36.0)
MCV: 66.9 fL — ABNORMAL LOW (ref 80.0–100.0)
Platelets: 233 K/uL (ref 150–400)
RBC: 5.28 MIL/uL (ref 4.22–5.81)
RDW: 21.7 % — ABNORMAL HIGH (ref 11.5–15.5)
WBC: 6 K/uL (ref 4.0–10.5)
nRBC: 0 % (ref 0.0–0.2)

## 2024-01-18 LAB — COOXEMETRY PANEL
Carboxyhemoglobin: 1.1 % (ref 0.5–1.5)
Methemoglobin: <0.7 % (ref 0.0–1.5)
O2 Saturation: 57.5 %
Total hemoglobin: 11.6 g/dL — ABNORMAL LOW (ref 12.0–16.0)

## 2024-01-18 LAB — DIGOXIN LEVEL: Digoxin Level: <0.6 ng/mL — ABNORMAL LOW (ref 0.8–2.0)

## 2024-01-18 LAB — MAGNESIUM: Magnesium: 2.2 mg/dL (ref 1.7–2.4)

## 2024-01-18 MED ORDER — AMIODARONE HCL 200 MG PO TABS
200.0000 mg | ORAL_TABLET | Freq: Every day | ORAL | Status: DC
  Administered 2024-01-18 – 2024-01-21 (×4): 200 mg via ORAL
  Filled 2024-01-18 (×4): qty 1

## 2024-01-18 MED ORDER — FUROSEMIDE 10 MG/ML IJ SOLN
40.0000 mg | Freq: Two times a day (BID) | INTRAMUSCULAR | Status: DC
  Administered 2024-01-18 – 2024-01-19 (×2): 40 mg via INTRAVENOUS
  Filled 2024-01-18 (×2): qty 4

## 2024-01-18 NOTE — Progress Notes (Addendum)
 This chaplain responded to PMT PA-Josseline consult for creating the Pt. HCPOA. The chaplain reviewed the chart notes before the visit. The chaplain understands from the consult the Pt. is choosing his S/O-Tatiana Theotis as his healthcare agent and his daughter as the second person.  The Pt. is asleep at the time of the visit. The Pt. is unable to stay awake and requests a revisit.  **1435 This chaplain attempted a revisit for AD education. Family is not at the bedside.   The Pt. wakes up to the call of his name and remains drowsy throughout the beginning of AD education. The Pt. and chaplain agree to a revisit at another time. With the Pt. permission,  a blank AD was left at the Pt. bedside.  This chaplain is available for F/U spiritual care as needed.   Chaplain Leeroy Hummer [phone]

## 2024-01-18 NOTE — Plan of Care (Signed)
  Problem: Education: Goal: Knowledge of General Education information will improve Description: Including pain rating scale, medication(s)/side effects and non-pharmacologic comfort measures Outcome: Progressing   Problem: Health Behavior/Discharge Planning: Goal: Ability to manage health-related needs will improve Outcome: Progressing   Problem: Clinical Measurements: Goal: Ability to maintain clinical measurements within normal limits will improve Outcome: Progressing Goal: Diagnostic test results will improve Outcome: Progressing   Problem: Activity: Goal: Risk for activity intolerance will decrease Outcome: Progressing   Problem: Nutrition: Goal: Adequate nutrition will be maintained Outcome: Progressing   Problem: Pain Managment: Goal: General experience of comfort will improve and/or be controlled Outcome: Progressing

## 2024-01-18 NOTE — Plan of Care (Signed)

## 2024-01-18 NOTE — TOC Progression Note (Signed)
 Transition of Care [facility]) - Progression Note    Patient Details  Name: Cody Guzman MRN: [id number] Date of Birth: [birth year]  [facility] CM/SW Contact  Justina Delcia Czar, RN Phone Number: [phone] 01/18/2024, 2:13 PM  Clinical Narrative:     Spoke to pt at bedside. States he lives with nephew. States he was independent pta. Has cane, scale and Living Better with HF booklet. States he used Medicaid or Pharmacist, community for transportation to appts.  States family will provide transportation home.   Expected Discharge Plan: Home/Self Care Barriers to Discharge: Continued Medical Work up   Expected Discharge Plan and Services   Discharge Planning Services: CM Consult   Living arrangements for the past 2 months: Apartment                     Social Drivers of Health (SDOH) Interventions SDOH Screenings   Food Insecurity: No Food Insecurity (01/12/2024)  Housing: Low Risk  (01/12/2024)  Transportation Needs: Unmet Transportation Needs (01/12/2024)  Utilities: Not At Risk (01/12/2024)  Alcohol  Screen: Low Risk  (01/13/2023)  Depression (PHQ2-9): High Risk (08/17/2023)  Financial Resource Strain: Low Risk  (01/13/2023)  Physical Activity: Insufficiently Active (01/13/2023)  Social Connections: Moderately Integrated (01/13/2023)  Stress: No Stress Concern Present (01/13/2023)  Tobacco Use: High Risk (01/12/2024)  Health Literacy: Adequate Health Literacy (01/13/2023)    Readmission Risk Interventions     No data to display

## 2024-01-18 NOTE — Progress Notes (Signed)
 Mobility Specialist Progress Note:    01/18/24 1053  Mobility  Activity Ambulated independently  Level of Assistance Independent after set-up  Assistive Device None  Distance Ambulated (ft) 100 ft  Activity Response Tolerated well  Mobility Referral Yes  Mobility visit 1 Mobility  Mobility Specialist Start Time (ACUTE ONLY) 1053  Mobility Specialist Stop Time (ACUTE ONLY) 1104  Mobility Specialist Time Calculation (min) (ACUTE ONLY) 11 min   Pt pleasant and agreeable to session. No c/o any symptoms initially, but stated he got a little lightheaded towards the end of session that got better once sitting. Notified RN. Pt able to move and ambulate on own but noticed some occasional stumbling to the right before correcting self. Returned pt in room w/ all needs met and case manager in room.   Venetia Keel Mobility Specialist Please Neurosurgeon or Rehab Office at [phone]

## 2024-01-18 NOTE — Progress Notes (Signed)
 Daily Progress Note   Patient Name: Cody Guzman       Date: 01/18/2024 DOB: July 29, 1965  Age: 58 y.o. MRN#: [id number] Attending Physician: Dino Antu, MD Primary Care Physician: Theotis Haze ORN, NP Admit Date: 01/11/2024  Reason for Consultation/Follow-up: Establishing goals of care  Subjective: Medical records reviewed including progress notes, labs, and imaging. Patient assessed at the bedside. He is fatigued, though easily aroused. No visitors were present.   Reviewed the plan for HCPOA completion and he remains agreeable. He shares that the medical team continues to provide him with clear updates and plan. He is hopeful for improvement and to return home soon as he is feeling better each day. Outpatient palliative care was explained and offered. He is agreeable.  Questions and concerns addressed. PMT will continue to support holistically.   Length of Stay: 5   Physical Exam Vitals and nursing note reviewed.  Constitutional:      General: He is not in acute distress. HENT:     Head: Normocephalic and atraumatic.  Cardiovascular:     Rate and Rhythm: Normal rate.  Pulmonary:     Effort: Pulmonary effort is normal.  Skin:    General: Skin is dry.  Neurological:     Mental Status: He is alert.  Psychiatric:        Mood and Affect: Mood normal.        Behavior: Behavior normal.             Vital Signs: BP (!) 100/57 (BP Location: Right Arm) Comment: taken twice  Pulse 93   Temp 98.5 F (36.9 C) (Oral)   Resp 19   Wt 74.1 kg   SpO2 93%   BMI 24.84 kg/m  SpO2: SpO2: 93 % O2 Device: O2 Device: Room Air O2 Flow Rate:        Palliative Assessment/Data: 70%   Palliative Care Assessment & Plan   Patient Profile: 58 y.o. male  with past medical history of cardiomyopathy, prior stroke, A-fib, non-Hodgkin's  lymphoma in remission, CAD admitted on 01/11/2024 with abdominal pain radiating into his chest for a few days as well as black stools.    Patient was admitted with acute GI bleeding and acute blood loss anemia, A-fib with RVR, AKI.  GIB thought to be related to PUD.  Due to his biventricular heart failure/cardiomyopathy, GI recommended optimizing cardiac status before EGD.  EF is 25 to 30% per echo on 9/10.  Patient's serious illness further complicated by incidental finding of early cirrhosis on CT and minimally elevated ammonia. PMT has been consulted to assist with goals of care conversation.  Assessment: Goals of care conversation Acute decompensated biventricular heart failure Concern for upper GI bleed ABLA Nonischemic cardiomyopathy  Recommendations/Plan: Continue full code/full scope treatment Patient's goals of care are clear for as much quantity of life as possible and all necessary interventions to prolong his life Patient agreeable to outpatient palliative care follow up. TOC consulted Psychosocial and emotional support provided PMT will see again 9/19. Please call team line if urgent needs arise in the interim    Prognosis:  Poor long-term prognosis  Discharge Planning: Home with Palliative Services   Care plan was discussed  with patient          Alverto Shedd SHAUNNA Fell, PA-C  Palliative Medicine Team Team phone # [phone]  Thank you for allowing the Palliative Medicine Team to assist in the care of this patient. Please utilize secure chat with additional questions, if there is no response within 30 minutes please call the above phone number.  Palliative Medicine Team providers are available by phone from 7am to 7pm daily and can be reached through the team cell phone.  Should this patient require assistance outside of these hours, please call the patient's attending physician.     Time Total: 25  Visit consisted of counseling and education dealing with the  complex and emotionally intense issues of symptom management and palliative care in the setting of serious and potentially life-threatening illness. Greater than 50% of this time was spent counseling and coordinating care related to the above assessment and plan.  Personally spent 25 minutes in patient care including extensive chart review (labs, imaging, progress/consult notes, vital signs), medically appropraite exam, discussed with treatment team, education to patient, family, and staff, documenting clinical information, medication review and management, coordination of care, and available advanced directive documents.
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
## 2024-01-18 NOTE — Progress Notes (Signed)
 Cardiology Progress Note  Patient ID: Cody Guzman MRN: [id number] DOB: 30-Jun-1965 Date of Encounter: 01/18/2024 Primary Cardiologist: Vina Gull, MD  Subjective   Chief Complaint: SOB  HPI: SOB but volume improving. Net negative 3.6 L.   ROS:  All other ROS reviewed and negative. Pertinent positives noted in the HPI.     Telemetry  Overnight telemetry shows SR 90s with PACs, which I personally reviewed.     Physical Exam   Vitals:   01/17/24 1924 01/17/24 2319 01/18/24 0455 01/18/24 0740  BP: 120/60 96/78 93/67  (!) 100/57  Pulse: 98 82 85 79  Resp: 19 18 16 19   Temp: 97.7 F (36.5 C) 97.7 F (36.5 C) 97.9 F (36.6 C) 98.5 F (36.9 C)  TempSrc: Axillary Axillary Oral Oral  SpO2: 92% 97% 96% 93%  Weight:   74.1 kg     Intake/Output Summary (Last 24 hours) at 01/18/2024 0819 Last data filed at 01/18/2024 0500 Gross per 24 hour  Intake 2311.54 ml  Output 5100 ml  Net -2788.46 ml       01/18/2024    4:55 AM 01/17/2024    8:42 AM 01/16/2024    6:23 AM  Last 3 Weights  Weight (lbs) 163 lb 5.8 oz 165 lb 9.1 oz 189 lb 2.5 oz  Weight (kg) 74.1 kg 75.1 kg 85.8 kg    Body mass index is 24.84 kg/m.  General: Well nourished, well developed, in no acute distress Head: Atraumatic, normal size  Eyes: PEERLA, EOMI  Neck: Supple, no JVD Endocrine: No thryomegaly Cardiac: Normal S1, S2; RRR; no murmurs, rubs, or gallops Lungs: Clear to auscultation bilaterally, no wheezing, rhonchi or rales  Abd: Soft, nontender, no hepatomegaly  Ext: No edema, pulses 2+ Musculoskeletal: No deformities, BUE and BLE strength normal and equal Skin: Warm and dry, no rashes   Neuro: Alert and oriented to person, place, time, and situation, CNII-XII grossly intact, no focal deficits  Psych: Normal mood and affect   Cardiac Studies  TTE 01/13/2024  1. Left ventricular ejection fraction, by estimation, is 25 to 30%. Left  ventricular ejection fraction by 2D MOD biplane is 30.0 %. The left   ventricle has severely decreased function. The left ventricle demonstrates  global hypokinesis. The left  ventricular internal cavity size was moderately dilated. Left ventricular  diastolic function could not be evaluated.   2. Right ventricular systolic function is moderately reduced. The right  ventricular size is normal. A pacemaker lead noted in the  mid-intraventricular septum is visualized. Tricuspid regurgitation signal  is inadequate for assessing PA pressure. The  estimated right ventricular systolic pressure is 29.0 mmHg.   3. Left atrial size was severely dilated.   4. Right atrial size was severely dilated.   5. There is no evidence of cardiac tamponade.   6. The mitral valve is abnormal. Moderate mitral valve regurgitation.   7. The tricuspid valve is abnormal. Tricuspid valve regurgitation is  moderate to severe.   8. The aortic valve is tricuspid. Aortic valve regurgitation is not  visualized.   9. The inferior vena cava is dilated in size with <50% respiratory  variability, suggesting right atrial pressure of 15 mmHg.   Patient Profile  Cody Guzman is a 58 y.o. male with systolic heart failure (EF 25-30%), persistent atrial fibrillation, CKD stage IIIb, CAD, CVA admitted on 01/11/2024 with GI bleed.  Course complicated by atrial fibrillation with RVR and cardiogenic shock.  Assessment & Plan   # Cardiogenic shock  -  Currently on milrinone  drip.  Coox 57.  Suspect he has limited options for advanced therapies.  Will have advanced heart failure team see him today. - Kidney function is stable. - Hold GDMT.  He is on digoxin .  Will be cautious given his kidney function.  We will await advanced heart failure evaluation. - Lactic acid is normal. - Also on midodrine . - Volume status seems acceptable.  His sodium is going down which is interesting. - We will have heart failure evaluate.  His volume status seems acceptable to me.  Can likely transition to oral  diuretics.  # Moderate to severe mitral regurgitation # Moderate to severe tricuspid regurgitation - Secondary to systolic heart failure.  Continue with approach as above.  # Persistent atrial fibrillation - Suspect he is in sinus rhythm.  We will recheck an EKG today. - Limited options.  Continue digoxin  0.125 mg daily.  We will watch his kidney function closely. - Continue amiodarone .  I will increase this to 200 mg daily. - No beta-blocker given cardiogenic shock. - Anticoagulation is on hold given GI bleed.  See discussion below.  We will need guidance on when it is safe to restart.  # Iron  deficiency anemia # GI bleed - Holding anticoagulation.  GI is not wanting to pursue endoscopic evaluation.  Will need guidance on what we can restart.  Hemoglobin seems to be stable.  Continue PPI.  # CKD 3b - Kidney function is stable.  # CAD s/p PCI  - Not on aspirin  since he is on anticoagulation.  All of this is being held.  No symptoms of angina.  Continue home statin.  # OSA - Home CPAP.  # Goals of care - Suspect we will have limited options for him.  Advanced heart failure consultation today.  Continue with palliative care discussions.  Prognosis is poor.     For questions or updates, please contact [facility] Please consult [email or web address] for contact info under         Signed, Darryle T. Barbaraann, MD, [facility]  [facility]  01/18/2024 8:19 AM

## 2024-01-18 NOTE — Progress Notes (Signed)
 PROGRESS NOTE    Cody Guzman  FMW:[id number] DOB: Jan 29, 1966 DOA: 01/11/2024 PCP: Theotis Haze ORN, NP   Brief Narrative:   58 y.o.  male with history of HFrEF-s/p ICD placement, atrial fibrillation, CVA, CKD stage IIIb-who presented to the hospital with several days history of upper abdominal discomfort/melanotic stools.  Patient was found to have upper GI bleeding with acute blood loss anemia. Cardiology, GI and Palliative teams consulted.On midodrine  and lasix  drips. Symptoms improving. Lasix  drip dced on 9/15.  Assessment & Plan:  Principal Problem:   Upper GI bleed Active Problems:   Cardiomyopathy (HCC)   History of non-Hodgkin's lymphoma   HLD (hyperlipidemia)   Abdominal pain   Benign hypertension   Bipolar 1 disorder (HCC)   Gout   OSA (obstructive sleep apnea)   Atrial fibrillation with rapid ventricular response (HCC)   Acute renal failure superimposed on stage 3a chronic kidney disease (HCC)   Anemia   Iron  deficiency anemia due to chronic blood loss   Paroxysmal atrial fibrillation (HCC)   Cardiac resynchronization therapy defibrillator (CRT-D) in place   Nonrheumatic tricuspid valve regurgitation   Marijuana abuse   History of cocaine abuse (HCC)   Atherosclerosis of native coronary artery of native heart without angina pectoris   Mitral valve disease   Pancytopenia (HCC)   Nonischemic cardiomyopathy (HCC)   Acute decompensated heart failure (HCC)    UGI bleed with ABLA Bleeding seems to have resolved-no BM since admission-Hb stable Suspicion for peptic ulcer-given upper abdominal discomfort Continue PPI GI holding off on EGD-needs cardiac status to be more optimized. Continue to follow CBC.   Upper abdominal discomfort Given GI bleeding-could be peptic ulcer disease No significant abnormality seen on CT abdomen-lipase within normal limits-still tender in the epigastric area without any peritoneal signs Continue PPI/Carafate .     Minimally  elevated troponins Trend is flat-in the setting of CHF   Acute toxic metabolic encephalopathy Has been mostly sleepy yesterday-but more awake and alert Not exactly sure the exact etiology-probably multifactorial-suspicion for low flow state/CHF-with contributions from Seroquel /Topamax  and possibly mildly elevated ammonia levels See below regarding cardiology input regarding low flow state Have decreased Seroquel  dosage to 200 mg Started lactulose  Follow closely.   Chronic HFrEF/nonischemic cardiomyopathy-s/p ICD implantation Monitor Co. Oximetry Beta  blockers on hold. On midodrine  and lasix  drip -Given IV digoxin  on 9/13 and is currently on oral digoxin . Monitor electrolytes and digoxin  level. Since blood pressure remains soft- Jardiance /ARB/Aldactone /Demadex  remains on hold Cardiology following-per cardiology-not a long term candidate for advanced therapies-if he does indeed have low output heart failure-his overall prognosis is going to be poor.   Palliative care on board. F/u advanced heart failure team recommendations.   PAF Rate controlled-continue amiodarone /metoprolol . Eliquis  on hold due to GI bleed.   History of CVA No obvious focal deficits Continue statin Resume Eliquis  when GI workup is complete.   CKD stage IIIb Close to baseline    Possible early cirrhosis noted on CT Incidental finding Minimally elevated ammonia levels-will start lactulose -to see if this may help with some amount of lethargy.   History of Hodgkin's lymphoma Reportedly in remission Outpatient oncology follow-up   Bipolar disorder Stable Per patient he does take 300 mg of Seroquel -given some amount of lethargy-Will reduce dosage to 200 mg.   Chronic daily headaches On Topamax -acknowledges that he has been on it for a while as well.   Gout No flare Allopurinol    BPH Flomax   DVT prophylaxis: SCDs Start: 01/12/24 0052  High risk  of deterioration. Poor Prognosis.Palliative team  consulted.     Code Status: Full Code Family Communication:  None at the bedside Status is: Inpatient Remains inpatient appropriate because: CHF, hypotension    Subjective:  He remains on midodrine  and lasix  drips. Denies shortness of breath and chest pain. He said that he is taking criminal justice course and has missed many classes since he has been in the hospital. Spasms have resolved.  Examination:  General exam: Appears calm and comfortable  Respiratory system: Clear to auscultation. Respiratory effort normal. Cardiovascular system: S1 & S2 heard, RRR. No JVD, murmurs, rubs, gallops or clicks. No pedal edema. Gastrointestinal system: Abdomen is nondistended, soft and nontender. No organomegaly or masses felt. Normal bowel sounds heard. Central nervous system: Alert and oriented. No focal neurological deficits. Extremities: Symmetric 5 x 5 power. Skin: No rashes, lesions or ulcers Psychiatry: Judgement and insight appear normal. Mood & affect appropriate.      Diet Orders (From admission, onward)     Start     Ordered   01/14/24 1403  Diet Heart Room service appropriate? Yes; Fluid consistency: Thin  Diet effective now       Question Answer Comment  Room service appropriate? Yes   Fluid consistency: Thin      01/14/24 1402            Objective: Vitals:   01/17/24 2319 01/18/24 0455 01/18/24 0740 01/18/24 0918  BP: 96/78 93/67 (!) 100/57   Pulse: 82 85 79 93  Resp: 18 16 19    Temp: 97.7 F (36.5 C) 97.9 F (36.6 C) 98.5 F (36.9 C)   TempSrc: Axillary Oral Oral   SpO2: 97% 96% 93%   Weight:  74.1 kg      Intake/Output Summary (Last 24 hours) at 01/18/2024 0954 Last data filed at 01/18/2024 0918 Gross per 24 hour  Intake 2201.54 ml  Output 4675 ml  Net -2473.46 ml   Filed Weights   01/16/24 0623 01/17/24 0842 01/18/24 0455  Weight: 85.8 kg 75.1 kg 74.1 kg    Scheduled Meds:  allopurinol   100 mg Oral Daily   amiodarone   200 mg Oral Daily    atorvastatin   80 mg Oral Daily   Chlorhexidine  Gluconate Cloth  6 each Topical Daily   digoxin   0.125 mg Oral Daily   furosemide   40 mg Intravenous BID   lactulose   10 g Oral BID   midodrine   5 mg Oral TID WC   pantoprazole  (PROTONIX ) IV  40 mg Intravenous Q12H   QUEtiapine   200 mg Oral QHS   sodium chloride  flush  10-40 mL Intracatheter Q12H   sucralfate   1 g Oral TID WC & HS   tamsulosin   0.4 mg Oral QPC supper   topiramate   25 mg Oral Daily   Continuous Infusions:  milrinone  0.125 mcg/kg/min (01/17/24 1427)    Nutritional status     Body mass index is 24.84 kg/m.  Data Reviewed:   CBC: Recent Labs  Lab 01/13/24 0450 01/13/24 1748 01/14/24 0558 01/15/24 0409 01/16/24 0512  WBC 5.1 5.1 4.5 3.8* 3.7*  NEUTROABS 3.3  --   --   --   --   HGB 7.9* 8.2* 8.4* 7.9* 7.6*  HCT 26.1* 27.7* 28.0* 26.4* 25.9*  MCV 67.4* 68.6* 68.1* 67.2* 68.2*  PLT 159 147* 148* 122* 133*   Basic Metabolic Panel: Recent Labs  Lab 01/15/24 0409 01/15/24 1425 01/16/24 0512 01/17/24 0500 01/18/24 0455  NA 133* 134* 131* 133* 129*  K 3.7 3.2* 3.6 3.3* 3.8  CL 106 108 102 97* 87*  CO2 15* 15* 18* 23 26  GLUCOSE 79 88 96 116* 115*  BUN 21* 18 21* 21* 22*  CREATININE 1.53* 1.41* 1.84* 1.69* 1.76*  CALCIUM  9.1 8.1* 9.1 9.9 10.1  MG  --   --  1.8 1.8 2.2   GFR: Estimated Creatinine Clearance: 43.2 mL/min (A) (by C-G formula based on SCr of 1.76 mg/dL (H)). Liver Function Tests: Recent Labs  Lab 01/12/24 0036 01/12/24 0800 01/15/24 1425  AST 77* 61* 32  ALT 56* 49* 34  ALKPHOS 121 104 110  BILITOT 5.5* 4.6* 2.6*  PROT 6.8 6.0* 5.7*  ALBUMIN 3.3* 2.8* 2.7*   Recent Labs  Lab 01/12/24 0036  LIPASE 31   Recent Labs  Lab 01/14/24 1622  AMMONIA 59*   Coagulation Profile: No results for input(s): INR, PROTIME in the last 168 hours. Cardiac Enzymes: No results for input(s): CKTOTAL, CKMB, CKMBINDEX, TROPONINI in the last 168 hours. BNP (last 3 results) No  results for input(s): PROBNP in the last 8760 hours. HbA1C: No results for input(s): HGBA1C in the last 72 hours. CBG: Recent Labs  Lab 01/12/24 1658 01/12/24 2150  GLUCAP 125* 123*   Lipid Profile: No results for input(s): CHOL, HDL, LDLCALC, TRIG, CHOLHDL, LDLDIRECT in the last 72 hours. Thyroid  Function Tests: No results for input(s): TSH, T4TOTAL, FREET4, T3FREE, THYROIDAB in the last 72 hours. Anemia Panel: No results for input(s): VITAMINB12, FOLATE, FERRITIN, TIBC, IRON , RETICCTPCT in the last 72 hours. Sepsis Labs: Recent Labs  Lab 01/13/24 0450 01/13/24 0814 01/14/24 1622 01/14/24 1921 01/16/24 1132  PROCALCITON 0.45  --   --   --   --   LATICACIDVEN 1.3 1.3 2.4* 2.1* 1.5    No results found for this or any previous visit (from the past 240 hours).       Radiology Studies: No results found.       LOS: 5 days   Time spent= 42 mins    Deliliah Room, MD [facility]  If 7PM-7AM, please contact night-coverage  01/18/2024, 9:54 AM

## 2024-01-19 ENCOUNTER — Encounter (HOSPITAL_COMMUNITY)
Admission: EM | Disposition: A | Discharge status: Home / Self Care | Payer: Self-pay | Source: Home / Self Care | Attending: Internal Medicine

## 2024-01-19 ENCOUNTER — Ambulatory Visit: Payer: No Typology Code available for payment source | Attending: Nurse Practitioner

## 2024-01-19 DIAGNOSIS — I480 Paroxysmal atrial fibrillation: Secondary | ICD-10-CM | POA: Diagnosis not present

## 2024-01-19 DIAGNOSIS — N179 Acute kidney failure, unspecified: Secondary | ICD-10-CM | POA: Diagnosis not present

## 2024-01-19 DIAGNOSIS — K922 Gastrointestinal hemorrhage, unspecified: Secondary | ICD-10-CM | POA: Diagnosis not present

## 2024-01-19 DIAGNOSIS — I5023 Acute on chronic systolic (congestive) heart failure: Secondary | ICD-10-CM | POA: Diagnosis not present

## 2024-01-19 HISTORY — PX: RIGHT HEART CATH: CATH118263

## 2024-01-19 LAB — POCT I-STAT EG7
Acid-Base Excess: 4 mmol/L — ABNORMAL HIGH (ref 0.0–2.0)
Acid-Base Excess: 5 mmol/L — ABNORMAL HIGH (ref 0.0–2.0)
Bicarbonate: 29.1 mmol/L — ABNORMAL HIGH (ref 20.0–28.0)
Bicarbonate: 29.6 mmol/L — ABNORMAL HIGH (ref 20.0–28.0)
Calcium, Ion: 1.16 mmol/L (ref 1.15–1.40)
Calcium, Ion: 1.19 mmol/L (ref 1.15–1.40)
HCT: 40 % (ref 39.0–52.0)
HCT: 40 % (ref 39.0–52.0)
Hemoglobin: 13.6 g/dL (ref 13.0–17.0)
Hemoglobin: 13.6 g/dL (ref 13.0–17.0)
O2 Saturation: 66 %
O2 Saturation: 66 %
Potassium: 3.4 mmol/L — ABNORMAL LOW (ref 3.5–5.1)
Potassium: 3.5 mmol/L (ref 3.5–5.1)
Sodium: 131 mmol/L — ABNORMAL LOW (ref 135–145)
Sodium: 131 mmol/L — ABNORMAL LOW (ref 135–145)
TCO2: 30 mmol/L (ref 22–32)
TCO2: 31 mmol/L (ref 22–32)
pCO2, Ven: 43.7 mmHg — ABNORMAL LOW (ref 44–60)
pCO2, Ven: 44.5 mmHg (ref 44–60)
pH, Ven: 7.431 — ABNORMAL HIGH (ref 7.25–7.43)
pH, Ven: 7.432 — ABNORMAL HIGH (ref 7.25–7.43)
pO2, Ven: 34 mmHg (ref 32–45)
pO2, Ven: 34 mmHg (ref 32–45)

## 2024-01-19 LAB — CBC
HCT: 36 % — ABNORMAL LOW (ref 39.0–52.0)
Hemoglobin: 11 g/dL — ABNORMAL LOW (ref 13.0–17.0)
MCH: 20.2 pg — ABNORMAL LOW (ref 26.0–34.0)
MCHC: 30.6 g/dL (ref 30.0–36.0)
MCV: 66.1 fL — ABNORMAL LOW (ref 80.0–100.0)
Platelets: 271 K/uL (ref 150–400)
RBC: 5.45 MIL/uL (ref 4.22–5.81)
RDW: 22 % — ABNORMAL HIGH (ref 11.5–15.5)
WBC: 6.7 K/uL (ref 4.0–10.5)
nRBC: 0 % (ref 0.0–0.2)

## 2024-01-19 LAB — BASIC METABOLIC PANEL WITH GFR
Anion gap: 11 (ref 5–15)
BUN: 25 mg/dL — ABNORMAL HIGH (ref 6–20)
CO2: 24 mmol/L (ref 22–32)
Calcium: 9 mg/dL (ref 8.9–10.3)
Chloride: 91 mmol/L — ABNORMAL LOW (ref 98–111)
Creatinine, Ser: 1.85 mg/dL — ABNORMAL HIGH (ref 0.61–1.24)
GFR, Estimated: 41 mL/min — ABNORMAL LOW (ref >60)
Glucose, Bld: 86 mg/dL (ref 70–99)
Potassium: 3.6 mmol/L (ref 3.5–5.1)
Sodium: 126 mmol/L — ABNORMAL LOW (ref 135–145)

## 2024-01-19 LAB — COOXEMETRY PANEL
Carboxyhemoglobin: 1.9 % — ABNORMAL HIGH (ref 0.5–1.5)
Methemoglobin: <0.7 % (ref 0.0–1.5)
O2 Saturation: 70.4 %
Total hemoglobin: 10.9 g/dL — ABNORMAL LOW (ref 12.0–16.0)

## 2024-01-19 LAB — MAGNESIUM: Magnesium: 2.1 mg/dL (ref 1.7–2.4)

## 2024-01-19 SURGERY — RIGHT HEART CATH
Anesthesia: LOCAL

## 2024-01-19 MED ORDER — POTASSIUM CHLORIDE CRYS ER 20 MEQ PO TBCR
40.0000 meq | EXTENDED_RELEASE_TABLET | Freq: Once | ORAL | Status: AC
Start: 2024-01-19 — End: 2024-01-19
  Administered 2024-01-19: 40 meq via ORAL
  Filled 2024-01-19: qty 2

## 2024-01-19 MED ORDER — LIDOCAINE HCL (PF) 1 % IJ SOLN
INTRAMUSCULAR | Status: DC | PRN
  Administered 2024-01-19: 5 mL via INTRADERMAL

## 2024-01-19 MED ORDER — LIDOCAINE HCL (PF) 1 % IJ SOLN
INTRAMUSCULAR | Status: AC
  Filled 2024-01-19: qty 30

## 2024-01-19 MED ORDER — SODIUM CHLORIDE 0.9 % IV SOLN: INTRAVENOUS | Status: DC

## 2024-01-19 MED ORDER — ASPIRIN 81 MG PO CHEW: 81.0000 mg | CHEWABLE_TABLET | ORAL | Status: DC

## 2024-01-19 MED ORDER — MIDAZOLAM HCL 2 MG/2ML IJ SOLN
INTRAMUSCULAR | Status: DC | PRN
  Administered 2024-01-19: 1 mg via INTRAVENOUS

## 2024-01-19 MED ORDER — ASPIRIN 81 MG PO CHEW
81.0000 mg | CHEWABLE_TABLET | ORAL | Status: AC
Start: 2024-01-19 — End: 2024-01-19
  Administered 2024-01-19: 81 mg via ORAL
  Filled 2024-01-19: qty 1

## 2024-01-19 MED ORDER — MIDAZOLAM HCL 2 MG/2ML IJ SOLN
INTRAMUSCULAR | Status: AC
  Filled 2024-01-19: qty 2

## 2024-01-19 MED ORDER — HEPARIN (PORCINE) IN NACL 1000-0.9 UT/500ML-% IV SOLN
INTRAVENOUS | Status: DC | PRN
  Administered 2024-01-19: 500 mL

## 2024-01-19 SURGICAL SUPPLY — 7 items
CATH SWAN GANZ 7F STRAIGHT (CATHETERS) IMPLANT
KIT MICROPUNCTURE NIT STIFF (SHEATH) IMPLANT
PACK CARDIAC CATHETERIZATION (CUSTOM PROCEDURE TRAY) ×1 IMPLANT
SHEATH PINNACLE 7F 10CM (SHEATH) IMPLANT
SHEATH PROBE COVER 6X72 (BAG) IMPLANT
TRANSDUCER W/STOPCOCK (MISCELLANEOUS) IMPLANT
TUBING ART PRESS 72 MALE/FEM (TUBING) IMPLANT

## 2024-01-19 NOTE — Progress Notes (Addendum)
 PROGRESS NOTE    Cody Guzman  FMW:[id number] DOB: December 26, 1965 DOA: 01/11/2024 PCP: Theotis Haze ORN, NP   Brief Narrative:   58 y.o.  male with history of HFrEF-s/p ICD placement, atrial fibrillation, CVA, CKD stage IIIb-who presented to the hospital with several days history of upper abdominal discomfort/melanotic stools.  Patient was found to have upper GI bleeding with acute blood loss anemia. Cardiology, GI and Palliative teams consulted.On midodrine  and lasix  drips. Symptoms improving. Lasix  and midodrine  drips dced on 9/15. RHC done on 9/16. Heart failure team is on board.  Assessment & Plan:  Principal Problem:   Upper GI bleed Active Problems:   Cardiomyopathy (HCC)   History of non-Hodgkin's lymphoma   HLD (hyperlipidemia)   Abdominal pain   Benign hypertension   Bipolar 1 disorder (HCC)   Gout   OSA (obstructive sleep apnea)   Atrial fibrillation with rapid ventricular response (HCC)   Acute renal failure superimposed on stage 3a chronic kidney disease (HCC)   Anemia   Iron  deficiency anemia due to chronic blood loss   Paroxysmal atrial fibrillation (HCC)   Cardiac resynchronization therapy defibrillator (CRT-D) in place   Nonrheumatic tricuspid valve regurgitation   Marijuana abuse   History of cocaine abuse (HCC)   Atherosclerosis of native coronary artery of native heart without angina pectoris   Mitral valve disease   Pancytopenia (HCC)   Nonischemic cardiomyopathy (HCC)   Acute decompensated heart failure (HCC)    UGI bleed with ABLA Bleeding seems to have resolved-no BM since admission-Hb stable Suspicion for peptic ulcer-given upper abdominal discomfort Continue PPI GI holding off on EGD-needs cardiac status to be more optimized. Continue to follow CBC.   Upper abdominal discomfort Given GI bleeding-could be peptic ulcer disease No significant abnormality seen on CT abdomen-lipase within normal limits-still tender in the epigastric area without any  peritoneal signs Continue PPI/Carafate .     Minimally elevated troponins Trend is flat-in the setting of CHF   Acute toxic metabolic encephalopathy Has been mostly sleepy yesterday-but more awake and alert Not exactly sure the exact etiology-probably multifactorial-suspicion for low flow state/CHF-with contributions from Seroquel /Topamax  and possibly mildly elevated ammonia levels See below regarding cardiology input regarding low flow state Have decreased Seroquel  dosage to 200 mg Started lactulose  Follow closely.   Chronic HFrEF/nonischemic cardiomyopathy-s/p ICD implantation Monitor Co. Oximetry Beta blockers on hold. Dced midodrine  and lasix  drips on 9/15. -Given IV digoxin  on 9/13 and is currently on oral digoxin . Monitor electrolytes and digoxin  level. Since blood pressure remains soft- Jardiance /ARB/Aldactone /Demadex  remains on hold Cardiology following-per cardiology-not a long term candidate for advanced therapies-if he does indeed have low output heart failure-his overall prognosis is going to be poor.   Palliative care on board. F/u advanced heart failure team recommendations. -S/p RHC on 9/16: Low pre and post capillary filling pressures, Severely reduced cardiac index by TD secondary to underlying cardiomyopathy & low filling pressures, Normal PVR   PAF Rate controlled-continue amiodarone /metoprolol . Eliquis  on hold due to GI bleed. May need conversion to sinus rhythm for better BP control and avoid need for milrinone . May need EP evaluation.   History of CVA No obvious focal deficits Continue statin Resume Eliquis  when GI workup is complete.   CKD stage IIIb Close to baseline    Possible early cirrhosis noted on CT Incidental finding Minimally elevated ammonia levels-will start lactulose -to see if this may help with some amount of lethargy.   History of Hodgkin's lymphoma Reportedly in remission Outpatient oncology follow-up  Bipolar disorder Stable Per  patient he does take 300 mg of Seroquel -given some amount of lethargy-Will reduce dosage to 200 mg.   Chronic daily headaches On Topamax -acknowledges that he has been on it for a while as well.   Gout No flare Allopurinol    BPH Flomax   DVT prophylaxis: SCDs Start: 01/12/24 0052  High risk of deterioration. Poor Prognosis.Palliative team consulted.     Code Status: Full Code Family Communication:  None at the bedside Status is: Inpatient Remains inpatient appropriate because: CHF, hypotension    Subjective:  No acute events overnight. Denies chest pain or shortness of breath. He has been kept NPO past midnight for RHC.  Examination:  General exam: Appears calm and comfortable  Respiratory system: Clear to auscultation. Respiratory effort normal. Cardiovascular system: S1 & S2 heard, RRR. No JVD, murmurs, rubs, gallops or clicks. No pedal edema. Gastrointestinal system: Abdomen is nondistended, soft and nontender. No organomegaly or masses felt. Normal bowel sounds heard. Central nervous system: Alert and oriented. No focal neurological deficits. Extremities: Symmetric 5 x 5 power. Skin: No rashes, lesions or ulcers Psychiatry: Judgement and insight appear normal. Mood & affect appropriate.      Diet Orders (From admission, onward)     Start     Ordered   01/19/24 0500  Diet NPO time specified Except for: Sips with Meds  (Inpatient Diet Options)  Diet effective 0500       Question:  Except for  Answer:  Sips with Meds   01/19/24 0438            Objective: Vitals:   01/18/24 1914 01/18/24 2315 01/19/24 0412 01/19/24 0734  BP: 91/64 (!) 85/64 100/69 97/75  Pulse: 80 88 75   Resp: 18 18 20 19   Temp: 98.3 F (36.8 C) 98.3 F (36.8 C) 98.4 F (36.9 C) 98.6 F (37 C)  TempSrc: Oral Oral Oral Oral  SpO2: 97% 96% 93% 96%  Weight:    79.1 kg  Height:    5' 8 (1.727 m)    Intake/Output Summary (Last 24 hours) at 01/19/2024 0856 Last data filed at 01/19/2024  0829 Gross per 24 hour  Intake 850 ml  Output 2050 ml  Net -1200 ml   Filed Weights   01/17/24 0842 01/18/24 0455 01/19/24 0734  Weight: 75.1 kg 74.1 kg 79.1 kg    Scheduled Meds:  allopurinol   100 mg Oral Daily   amiodarone   200 mg Oral Daily   atorvastatin   80 mg Oral Daily   Chlorhexidine  Gluconate Cloth  6 each Topical Daily   digoxin   0.125 mg Oral Daily   furosemide   40 mg Intravenous BID   lactulose   10 g Oral BID   midodrine   5 mg Oral TID WC   pantoprazole  (PROTONIX ) IV  40 mg Intravenous Q12H   potassium chloride   40 mEq Oral Once   QUEtiapine   200 mg Oral QHS   sodium chloride  flush  10-40 mL Intracatheter Q12H   sucralfate   1 g Oral TID WC & HS   tamsulosin   0.4 mg Oral QPC supper   topiramate   25 mg Oral Daily   Continuous Infusions:  sodium chloride  10 mL/hr at 01/19/24 0807    Nutritional status     Body mass index is 26.51 kg/m.  Data Reviewed:   CBC: Recent Labs  Lab 01/13/24 0450 01/13/24 1748 01/14/24 0558 01/15/24 0409 01/16/24 0512 01/18/24 0940  WBC 5.1 5.1 4.5 3.8* 3.7* 6.0  NEUTROABS 3.3  --   --   --   --   --  HGB 7.9* 8.2* 8.4* 7.9* 7.6* 10.6*  HCT 26.1* 27.7* 28.0* 26.4* 25.9* 35.3*  MCV 67.4* 68.6* 68.1* 67.2* 68.2* 66.9*  PLT 159 147* 148* 122* 133* 233   Basic Metabolic Panel: Recent Labs  Lab 01/15/24 1425 01/16/24 0512 01/17/24 0500 01/18/24 0455 01/19/24 0351  NA 134* 131* 133* 129* 126*  K 3.2* 3.6 3.3* 3.8 3.6  CL 108 102 97* 87* 91*  CO2 15* 18* 23 26 24   GLUCOSE 88 96 116* 115* 86  BUN 18 21* 21* 22* 25*  CREATININE 1.41* 1.84* 1.69* 1.76* 1.85*  CALCIUM  8.1* 9.1 9.9 10.1 9.0  MG  --  1.8 1.8 2.2 2.1   GFR: Estimated Creatinine Clearance: 41.1 mL/min (A) (by C-G formula based on SCr of 1.85 mg/dL (H)). Liver Function Tests: Recent Labs  Lab 01/15/24 1425  AST 32  ALT 34  ALKPHOS 110  BILITOT 2.6*  PROT 5.7*  ALBUMIN 2.7*   No results for input(s): LIPASE, AMYLASE in the last 168  hours.  Recent Labs  Lab 01/14/24 1622  AMMONIA 59*   Coagulation Profile: No results for input(s): INR, PROTIME in the last 168 hours. Cardiac Enzymes: No results for input(s): CKTOTAL, CKMB, CKMBINDEX, TROPONINI in the last 168 hours. BNP (last 3 results) No results for input(s): PROBNP in the last 8760 hours. HbA1C: No results for input(s): HGBA1C in the last 72 hours. CBG: Recent Labs  Lab 01/12/24 1658 01/12/24 2150  GLUCAP 125* 123*   Lipid Profile: No results for input(s): CHOL, HDL, LDLCALC, TRIG, CHOLHDL, LDLDIRECT in the last 72 hours. Thyroid  Function Tests: No results for input(s): TSH, T4TOTAL, FREET4, T3FREE, THYROIDAB in the last 72 hours. Anemia Panel: No results for input(s): VITAMINB12, FOLATE, FERRITIN, TIBC, IRON , RETICCTPCT in the last 72 hours. Sepsis Labs: Recent Labs  Lab 01/13/24 0450 01/13/24 0814 01/14/24 1622 01/14/24 1921 01/16/24 1132  PROCALCITON 0.45  --   --   --   --   LATICACIDVEN 1.3 1.3 2.4* 2.1* 1.5    No results found for this or any previous visit (from the past 240 hours).       Radiology Studies: No results found.     LOS: 6 days   Time spent= 42 mins    Deliliah Room, MD [facility]  If 7PM-7AM, please contact night-coverage  01/19/2024, 8:56 AM

## 2024-01-19 NOTE — Interval H&P Note (Signed)
 History and Physical Interval Note:  01/19/2024 9:49 AM  Cody Guzman  has presented today for surgery, with the diagnosis of hf.  The various methods of treatment have been discussed with the patient and family. After consideration of risks, benefits and other options for treatment, the patient has consented to  Procedure(s): RIGHT HEART CATH (N/A) as a surgical intervention.  The patient's history has been reviewed, patient examined, no change in status, stable for surgery.  I have reviewed the patient's chart and labs.  Questions were answered to the patient's satisfaction.     Zofia Peckinpaugh

## 2024-01-19 NOTE — Progress Notes (Signed)
 PT Cancellation Note  Patient Details Name: Cody Guzman MRN: [id number] DOB: 1965/09/21   Cancelled Treatment:    Reason Eval/Treat Not Completed: (P) Patient at procedure or test/unavailable (pt at Cath Lab for procedure.) Will continue efforts per PT plan of care as schedule permits.   [name] 01/19/2024, 9:42 AM

## 2024-01-19 NOTE — Progress Notes (Signed)
 Mobility Specialist Progress Note:   01/19/24 1715  Mobility  Activity Ambulated with assistance  Level of Assistance Contact guard assist, steadying assist  Assistive Device None  Distance Ambulated (ft) 300 ft  Activity Response Tolerated well  Mobility Referral Yes  Mobility visit 1 Mobility  Mobility Specialist Start Time (ACUTE ONLY) 1715  Mobility Specialist Stop Time (ACUTE ONLY) 1725  Mobility Specialist Time Calculation (min) (ACUTE ONLY) 10 min   Pt agreeable to mobility session. RN in to give BP meds, d/t being low. Pt with no c/o dizziness throughout ambulation. Ambulated with a sway, however no unsteadiness noted. Pt states he felt at baseline. BP elevated 150s/130s after mobility, asx. RN aware.   Therisa Rana Mobility Specialist Please contact via SecureChat or  Rehab office at [phone]

## 2024-01-19 NOTE — Plan of Care (Signed)
  Problem: Education: Goal: Knowledge of General Education information will improve Description: Including pain rating scale, medication(s)/side effects and non-pharmacologic comfort measures Outcome: Progressing   Problem: Health Behavior/Discharge Planning: Goal: Ability to manage health-related needs will improve Outcome: Progressing   Problem: Clinical Measurements: Goal: Ability to maintain clinical measurements within normal limits will improve Outcome: Progressing Goal: Diagnostic test results will improve Outcome: Progressing   Problem: Activity: Goal: Risk for activity intolerance will decrease Outcome: Progressing   Problem: Nutrition: Goal: Adequate nutrition will be maintained Outcome: Progressing   Problem: Pain Managment: Goal: General experience of comfort will improve and/or be controlled Outcome: Progressing   Problem: Safety: Goal: Ability to remain free from injury will improve Outcome: Progressing
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
## 2024-01-19 NOTE — Plan of Care (Signed)

## 2024-01-20 ENCOUNTER — Other Ambulatory Visit (HOSPITAL_COMMUNITY): Payer: Self-pay

## 2024-01-20 ENCOUNTER — Encounter (HOSPITAL_COMMUNITY): Payer: Self-pay | Admitting: Cardiology

## 2024-01-20 DIAGNOSIS — I10 Essential (primary) hypertension: Secondary | ICD-10-CM

## 2024-01-20 DIAGNOSIS — I5023 Acute on chronic systolic (congestive) heart failure: Secondary | ICD-10-CM

## 2024-01-20 DIAGNOSIS — F319 Bipolar disorder, unspecified: Secondary | ICD-10-CM

## 2024-01-20 DIAGNOSIS — K922 Gastrointestinal hemorrhage, unspecified: Secondary | ICD-10-CM | POA: Diagnosis not present

## 2024-01-20 DIAGNOSIS — K921 Melena: Secondary | ICD-10-CM

## 2024-01-20 DIAGNOSIS — I480 Paroxysmal atrial fibrillation: Secondary | ICD-10-CM

## 2024-01-20 DIAGNOSIS — E782 Mixed hyperlipidemia: Secondary | ICD-10-CM

## 2024-01-20 DIAGNOSIS — D500 Iron deficiency anemia secondary to blood loss (chronic): Secondary | ICD-10-CM

## 2024-01-20 DIAGNOSIS — R109 Unspecified abdominal pain: Secondary | ICD-10-CM

## 2024-01-20 DIAGNOSIS — Z8572 Personal history of non-Hodgkin lymphomas: Secondary | ICD-10-CM

## 2024-01-20 LAB — CBC
HCT: 35.9 % — ABNORMAL LOW (ref 39.0–52.0)
Hemoglobin: 10.7 g/dL — ABNORMAL LOW (ref 13.0–17.0)
MCH: 19.9 pg — ABNORMAL LOW (ref 26.0–34.0)
MCHC: 29.8 g/dL — ABNORMAL LOW (ref 30.0–36.0)
MCV: 66.7 fL — ABNORMAL LOW (ref 80.0–100.0)
Platelets: 303 K/uL (ref 150–400)
RBC: 5.38 MIL/uL (ref 4.22–5.81)
RDW: 21.7 % — ABNORMAL HIGH (ref 11.5–15.5)
WBC: 6.5 K/uL (ref 4.0–10.5)
nRBC: 0 % (ref 0.0–0.2)

## 2024-01-20 LAB — COOXEMETRY PANEL
Carboxyhemoglobin: 1.3 % (ref 0.5–1.5)
Methemoglobin: 1 % (ref 0.0–1.5)
O2 Saturation: 62.5 %
Total hemoglobin: 11.8 g/dL — ABNORMAL LOW (ref 12.0–16.0)

## 2024-01-20 LAB — BASIC METABOLIC PANEL WITH GFR
Anion gap: 17 — ABNORMAL HIGH (ref 5–15)
BUN: 27 mg/dL — ABNORMAL HIGH (ref 6–20)
CO2: 23 mmol/L (ref 22–32)
Calcium: 9.6 mg/dL (ref 8.9–10.3)
Chloride: 87 mmol/L — ABNORMAL LOW (ref 98–111)
Creatinine, Ser: 1.7 mg/dL — ABNORMAL HIGH (ref 0.61–1.24)
GFR, Estimated: 46 mL/min — ABNORMAL LOW (ref >60)
Glucose, Bld: 93 mg/dL (ref 70–99)
Potassium: 4 mmol/L (ref 3.5–5.1)
Sodium: 127 mmol/L — ABNORMAL LOW (ref 135–145)

## 2024-01-20 LAB — MAGNESIUM: Magnesium: 2 mg/dL (ref 1.7–2.4)

## 2024-01-20 MED ORDER — SODIUM CHLORIDE 0.9 % IV SOLN: INTRAVENOUS | Status: AC

## 2024-01-20 MED ORDER — SIMETHICONE 80 MG PO CHEW
240.0000 mg | CHEWABLE_TABLET | Freq: Once | ORAL | Status: AC
  Administered 2024-01-20: 240 mg via ORAL
  Filled 2024-01-20 (×2): qty 3

## 2024-01-20 MED ORDER — SIMETHICONE 80 MG PO CHEW
240.0000 mg | CHEWABLE_TABLET | Freq: Once | ORAL | Status: AC
  Administered 2024-01-20: 240 mg via ORAL
  Filled 2024-01-20: qty 3

## 2024-01-20 MED ORDER — NA SULFATE-K SULFATE-MG SULF 17.5-3.13-1.6 GM/177ML PO SOLN
0.5000 | Freq: Once | ORAL | Status: AC
  Administered 2024-01-20: 177 mL via ORAL
  Filled 2024-01-20: qty 1

## 2024-01-20 MED ORDER — NA SULFATE-K SULFATE-MG SULF 17.5-3.13-1.6 GM/177ML PO SOLN
0.5000 | Freq: Once | ORAL | Status: AC
  Administered 2024-01-20: 177 mL via ORAL

## 2024-01-20 NOTE — H&P (View-Only) (Signed)
 Reconsult  DOA: 01/11/2024 Hospital Day: 10  Cc: Recent GI bleed, anemia  ASSESSMENT    Brief narrative  58 year old male with multiple medical problems we saw him earlier this admission for evaluation of anemia.  He was heme-negative but reported black stool the week prior.  He takes Eliquis  .    Cardiology did give clearance for endoscopic evaluation but the patient was feeling poorly with lethargy and shortness of breath and we felt he would be unable to tolerate the procedures.  We were reconsulted about resuming Eliquis  and whether we would like to pursue endoscopic evaluation now that patient is better from cardiac standpoint  Iron  deficiency anemia Recent black stools on Eliquis  (prior to admission).  Heme-negative this admission TODAY: Hemoglobin stable at 10.7.  Having normal bowel movements.  No blood in stool or black stools since prior to admission.  From cardiac standpoint he feels better compared to when we saw him several days ago   CAD status post PCI Cardiogenic shock Moderate to severe mitral regurgitation  Mitral to severe tricuspid regurgitation  ICD Atrial fibrillation on Eliquis   CKD 3  History of CVA   Principal Problem:   Upper GI bleed Active Problems:   Cardiomyopathy (HCC)   History of non-Hodgkin's lymphoma   HLD (hyperlipidemia)   Abdominal pain   Benign hypertension   Bipolar 1 disorder (HCC)   Gout   OSA (obstructive sleep apnea)   Atrial fibrillation with rapid ventricular response (HCC)   Acute renal failure superimposed on stage 3a chronic kidney disease (HCC)   Anemia   Iron  deficiency anemia due to chronic blood loss   Paroxysmal atrial fibrillation (HCC)   Cardiac resynchronization therapy defibrillator (CRT-D) in place   Nonrheumatic tricuspid valve regurgitation   Marijuana abuse   History of cocaine abuse (HCC)   Atherosclerosis of native coronary artery of native heart without angina pectoris   Mitral valve disease    Pancytopenia (HCC)   Nonischemic cardiomyopathy (HCC)   Acute decompensated heart failure (HCC)    PLAN   --Will proceed with EGD and colonoscopy tomorrow.   Schedule for EGD and colonoscopy. The risks and benefits of EGD with possible biopsies and colonoscopy with possible biopsies and removal of polyps were discussed with the patient who agrees to proceed. Patient does understand that he is at increased risk for sedation/procedures given severe cardiac disease  Subjective   Having normal bowel movements.  No blood in stool or black stools since prior to admission.  From cardiac standpoint he feels better compared to when we saw him several days ago.  Eager to get home    Objective     Recent Labs    01/18/24 0940 01/19/24 0903 01/19/24 1015 01/20/24 0815  WBC 6.0 6.7  --  6.5  HGB 10.6* 11.0* 13.6  13.6 10.7*  HCT 35.3* 36.0* 40.0  40.0 35.9*  MCV 66.9* 66.1*  --  66.7*  PLT 233 271  --  303   No results for input(s): FOLATE, VITAMINB12, FERRITIN, TIBC, IRONPCTSAT in the last 72 hours. Recent Labs    01/18/24 0455 01/19/24 0351 01/19/24 1015 01/20/24 0500  NA 129* 126* 131*  131* 127*  K 3.8 3.6 3.5  3.4* 4.0  CL 87* 91*  --  87*  CO2 26 24  --  23  GLUCOSE 115* 86  --  93  BUN 22* 25*  --  27*  CREATININE 1.76* 1.85*  --  1.70*  CALCIUM   10.1 9.0  --  9.6   No results for input(s): PROT, ALBUMIN, AST, ALT, ALKPHOS, BILITOT, BILIDIR, IBILI in the last 72 hours.    Imaging:  CARDIAC CATHETERIZATION HEMODYNAMICS: RA:   1 mmHg (mean) RV:   14/1 mmHg PA:   16/7 mmHg (10 mean) PCWP:  5 mmHg (mean)     Estimated Fick CO/CI   5.4 L/min, 2.8 L/min/m2 Thermodilution CO/CI   3.8 L/min, 2.06 L/min/m2      TPG    5  mmHg      PVR     1-1.3 Wood Units  PAPi      9    IMPRESSION: Low pre and post capillary filling pressures Severely reduced cardiac index by TD secondary to underlying  cardiomyopathy & low filling pressures.   Normal PVR  Aditya Sabharwal 10:22 AM     Scheduled inpatient medications:   allopurinol   100 mg Oral Daily   amiodarone   200 mg Oral Daily   atorvastatin   80 mg Oral Daily   Chlorhexidine  Gluconate Cloth  6 each Topical Daily   digoxin   0.125 mg Oral Daily   lactulose   10 g Oral BID   midodrine   5 mg Oral TID WC   pantoprazole  (PROTONIX ) IV  40 mg Intravenous Q12H   QUEtiapine   200 mg Oral QHS   sodium chloride  flush  10-40 mL Intracatheter Q12H   sucralfate   1 g Oral TID WC & HS   tamsulosin   0.4 mg Oral QPC supper   topiramate   25 mg Oral Daily   Continuous inpatient infusions:  PRN inpatient medications: albuterol , methocarbamol , oxyCODONE , sodium chloride  flush  Vital signs in last 24 hours: Temp:  [98.2 F (36.8 C)-98.6 F (37 C)] 98.2 F (36.8 C) (09/17 1059) Pulse Rate:  [75-87] 78 (09/17 0938) Resp:  [14-19] 16 (09/17 1059) BP: (86-125)/(60-91) 101/67 (09/17 1059) SpO2:  [95 %-98 %] 97 % (09/17 1059) Weight:  [76.2 kg] 76.2 kg (09/17 0415) Last BM Date : 01/20/24  Intake/Output Summary (Last 24 hours) at 01/20/2024 1259 Last data filed at 01/20/2024 1100 Gross per 24 hour  Intake 729.34 ml  Output 1250 ml  Net -520.66 ml    Intake/Output from previous day: 09/16 0701 - 09/17 0700 In: 719.3 [P.O.:600; I.V.:119.3] Out: 1900 [Urine:1900] Intake/Output this shift: Total I/O In: 10 [I.V.:10] Out: 200 [Urine:200]   Physical Exam:  General: Alert male in NAD Heart:  Regular rate and rhythm.  Pulmonary: Normal respiratory effort Abdomen: Soft, nondistended, nontender. Normal bowel sounds. Extremities: No lower extremity edema  Neurologic: Alert and oriented Psych: Pleasant. Cooperative     LOS: 7 days   Vina Dasen ,NP 01/20/2024, 12:59 PM  -------------------------------------------------------------------------------------------------  I have taken a history, reviewed the chart and examined the patient. I performed a substantive portion of  this encounter, including complete performance of at least one of the key components, in conjunction with the APP. I agree with the APP's note, impression and recommendations.  58 year old male with significant cardiac comorbidities to include CAD s/p PCI, severe systolic heart failure s/p ICD, A-fib and CKD, admitted with upper abdominal pain and melenic stools and acute drop in hemoglobin.  Endoscopic evaluation deferred at the time due to severe heart failure and no active bleeding with stable hemoglobin.   Cardiology parameters have improved following diuresis and inotropic therapy and patient is now euvolemic and optimized for an elective sedated procedure.  Cleared from cardiology standpoint.   He has not had any  further melenic stools and has no GI symptoms currently.  His hgb has remained stable through his hospitalization.   He has never had a previous EGD or colonoscopy.  Not currently on any antiplatelet agents or anticoagulation (Eliquis  held since admission)  Plan for EGD and colonoscopy tomorrow.  I discussed the details, risks and benefits of the procedures with the patient and he wishes to proceed.  He understands he is at high risk of complications from sedation due to his cardiac comorbidities.     Lurlie Wigen E. Stacia, MD [facility]

## 2024-01-20 NOTE — Hospital Course (Signed)
 Cody Guzman was admitted to the hospital with the working diagnosis of decompensated  hart failure.   58 y.o.  male with history of heart failure, atrial fibrillation, CVA, CKD stage IIIb-who presented to the hospital with several days history of upper abdominal discomfort/melanotic stools. Noted not adherent to his medical therapy at home.   On his initial physical examination his HR was 120 to 135, blood pressure 104/79, RR 24 and 02 saturation 100%  Lungs with no rales or rhonchi, heart with S1 and S2 present, irregularly irregular, abdomen with no distention and non tender, no lower extremity edema.   Na 134, K 4.2 Cl 103 bicarbonate 16 glucose 84 bun 24 cr 1,76  High sensitive troponin 55 and 42  Wbc 6,2 hgb 8,6 plt 186  Urine analysis SG 1,017, protein 100, negative leukocytes and negative hgb.   Chest radiograph with hypoinflation, positive cardiomegaly, bilateral hilar vascular congestion, pacemaker defibrillator in place with one right atrial led and biventricular leads.   Patient was found to have upper GI bleeding with acute blood loss anemia.  Low cardiac output and placed on midodrine  and lasix  drips. Symptoms improving. Lasix  and midodrine  drips dced on 9/15.  RHC done on 9/16 with improved filling pressures.  09/18 EGD and colonoscopy with friable gastric polyps, likely source of bleeding.  Positive colonic polyps.  Plan for discharge home and possible outpatient cardioversion.

## 2024-01-20 NOTE — Plan of Care (Signed)
  Problem: Education: Goal: Knowledge of General Education information will improve Description: Including pain rating scale, medication(s)/side effects and non-pharmacologic comfort measures Outcome: Progressing   Problem: Health Behavior/Discharge Planning: Goal: Ability to manage health-related needs will improve Outcome: Progressing   Problem: Clinical Measurements: Goal: Ability to maintain clinical measurements within normal limits will improve Outcome: Progressing   Problem: Activity: Goal: Risk for activity intolerance will decrease Outcome: Progressing   Problem: Pain Managment: Goal: General experience of comfort will improve and/or be controlled Outcome: Progressing   Problem: Safety: Goal: Ability to remain free from injury will improve Outcome: Progressing   Problem: Education: Goal: Understanding of CV disease, CV risk reduction, and recovery process will improve Outcome: Progressing

## 2024-01-20 NOTE — Progress Notes (Signed)
 Occupational Therapy Treatment Patient Details Name: Cody Guzman MRN: [id number] DOB: [birth year] Today's Date: 01/20/2024   History of present illness Pt is 58 yo presenting to [facility] on 9/8 due to upper abdominal discomfort/melanotic stools. Found to have upper GIB with acute blood loss anemia. PMHx: CAD, ICM, non-hodgkin's lymphoma in remmision, migraines, CKD2, CVA, HFrEF s/p ICD placement.   OT comments  Patient demonstrating good gains with self care and mobility. Patient setup to supervision for self care and supervision to [name] for mobility.  Energy conservations strategies handout provided and reviewed with patient.  Discharge recommendations continue to be appropriate. Acute OT to continue to follow to address established goals.       If plan is discharge home, recommend the following:  A little help with walking and/or transfers;A little help with bathing/dressing/bathroom;Assistance with cooking/housework;Assist for transportation;Help with stairs or ramp for entrance   Equipment Recommendations  None recommended by OT    Recommendations for Other Services      Precautions / Restrictions Precautions Precautions: Fall Recall of Precautions/Restrictions: Intact Restrictions Weight Bearing Restrictions Per Provider Order: No       Mobility Bed Mobility Overal bed mobility: Independent                  Transfers Overall transfer level: Needs assistance Equipment used: None Transfers: Sit to/from Stand, Bed to chair/wheelchair/BSC Sit to Stand: Supervision           General transfer comment: supervision for mobility and transfers without an assistive device     Balance Overall balance assessment: Mild deficits observed, not formally tested                                         ADL either performed or assessed with clinical judgement   ADL Overall ADL's : Needs assistance/impaired     Grooming: Wash/dry hands;Wash/dry face;Oral  care;Set up;Standing Grooming Details (indicate cue type and reason): at sink             Lower Body Dressing: Supervision/safety;Sit to/from stand Lower Body Dressing Details (indicate cue type and reason): donning socks             Functional mobility during ADLs: Supervision/safety General ADL Comments: supervision for safety with self care    Extremity/Trunk Assessment              Vision       Perception     Praxis     Communication Communication Communication: No apparent difficulties   Cognition Arousal: Alert Behavior During Therapy: WFL for tasks assessed/performed Cognition: No apparent impairments                               Following commands: Intact        Cueing   Cueing Techniques: Verbal cues  Exercises      Shoulder Instructions       General Comments energy conservation handout provided and reviewed with patient    Pertinent Vitals/ Pain       Pain Assessment Pain Assessment: No/denies pain  Home Living                                          Prior Functioning/Environment  Frequency  Min 1X/week        Progress Toward Goals  OT Goals(current goals can now be found in the care plan section)  Progress towards OT goals: Progressing toward goals  Acute Rehab OT Goals Patient Stated Goal: to go home OT Goal Formulation: With patient Time For Goal Achievement: 01/29/24 Potential to Achieve Goals: Good ADL Goals Pt Will Perform Grooming: Independently;standing Pt Will Perform Lower Body Bathing: Independently;sit to/from stand Pt Will Perform Lower Body Dressing: Independently;sit to/from stand Pt Will Transfer to Toilet: with modified independence;ambulating;regular height toilet (with least restrictive AD) Pt Will Perform Toileting - Clothing Manipulation and hygiene: Independently;sit to/from stand;sitting/lateral leans Additional ADL Goal #1: Patient will  demonstrate ability to Indepndently state 3 energy conservation strategies to increase safety and independence with functional tasks.  Plan      Co-evaluation                 AM-PAC OT 6 Clicks Daily Activity     Outcome Measure   Help from another person eating meals?: None Help from another person taking care of personal grooming?: A Little Help from another person toileting, which includes using toliet, bedpan, or urinal?: A Little Help from another person bathing (including washing, rinsing, drying)?: A Little Help from another person to put on and taking off regular upper body clothing?: None Help from another person to put on and taking off regular lower body clothing?: A Little 6 Click Score: 20    End of Session Equipment Utilized During Treatment: Gait belt  OT Visit Diagnosis: Other (comment) (decreased activity tolerance)   Activity Tolerance Patient tolerated treatment well   Patient Left in chair;with call bell/phone within reach;with nursing/sitter in room   Nurse Communication Mobility status        Time: 9254-9189 OT Time Calculation (min): 25 min  Charges: OT General Charges $OT Visit: 1 Visit OT Treatments $Self Care/Home Management : 8-22 mins $Therapeutic Activity: 8-22 mins  Cody Guzman, OTA Acute Rehabilitation Services  Office [phone]   Cody Guzman 01/20/2024, 9:15 AM

## 2024-01-20 NOTE — Anesthesia Preprocedure Evaluation (Signed)
 Anesthesia Evaluation  Patient identified by MRN, date of birth, ID band Patient awake    Reviewed: Allergy & Precautions, NPO status , Patient's Chart, lab work & pertinent test results  History of Anesthesia Complications Negative for: history of anesthetic complications  Airway Mallampati: II  TM Distance: >3 FB Neck ROM: Full    Dental  (+) Dental Advisory Given   Pulmonary asthma , Current Smoker and Patient abstained from smoking.   Pulmonary exam normal        Cardiovascular hypertension, Pt. on home beta blockers and Pt. on medications + CAD, + Cardiac Stents and +CHF  Normal cardiovascular exam+ dysrhythmias Atrial Fibrillation + Cardiac Defibrillator + Valvular Problems/Murmurs MR    '25 RHC - 1. Low pre and post capillary filling pressures 2. Severely reduced cardiac index by TD secondary to underlying cardiomyopathy & low filling pressures.  3. Normal PVR  '25 TTE - EF 25 to 30%. Global hypokinesis. The left ventricular internal cavity size was moderately dilated. Right ventricular systolic function is moderately reduced. A pacemaker lead noted in the mid-intraventricular septum is visualized. Left atrial size was severely dilated. Right atrial size was severely dilated. Moderate MR. Moderate-severe TR.     Neuro/Psych  PSYCHIATRIC DISORDERS Anxiety Depression Bipolar Disorder    Neuromuscular disease    GI/Hepatic negative GI ROS,,,(+)     substance abuse  cocaine use and marijuana use  Endo/Other   Na 127   Renal/GU CRFRenal disease     Musculoskeletal  (+) Arthritis ,    Abdominal   Peds  Hematology  (+) Blood dyscrasia, anemia  NHL On eliquis     Anesthesia Other Findings   Reproductive/Obstetrics                              Anesthesia Physical Anesthesia Plan  ASA: 4  Anesthesia Plan: MAC   Post-op Pain Management: Minimal or no pain anticipated    Induction:   PONV Risk Score and Plan: 0 and Propofol  infusion and Treatment may vary due to age or medical condition  Airway Management Planned: Nasal Cannula and Natural Airway  Additional Equipment: None  Intra-op Plan:   Post-operative Plan:   Informed Consent: I have reviewed the patients History and Physical, chart, labs and discussed the procedure including the risks, benefits and alternatives for the proposed anesthesia with the patient or authorized representative who has indicated his/her understanding and acceptance.       Plan Discussed with: CRNA and Anesthesiologist  Anesthesia Plan Comments:          Anesthesia Quick Evaluation

## 2024-01-20 NOTE — Assessment & Plan Note (Signed)
Continue Cpap

## 2024-01-20 NOTE — Assessment & Plan Note (Signed)
 Continue with quetiapine  and topiramate 

## 2024-01-20 NOTE — Progress Notes (Addendum)
 Reconsult  DOA: 01/11/2024 Hospital Day: 10  Cc: Recent GI bleed, anemia  ASSESSMENT    Brief narrative  58 year old male with multiple medical problems we saw him earlier this admission for evaluation of anemia.  He was heme-negative but reported black stool the week prior.  He takes Eliquis  .    Cardiology did give clearance for endoscopic evaluation but the patient was feeling poorly with lethargy and shortness of breath and we felt he would be unable to tolerate the procedures.  We were reconsulted about resuming Eliquis  and whether we would like to pursue endoscopic evaluation now that patient is better from cardiac standpoint  Iron  deficiency anemia Recent black stools on Eliquis  (prior to admission).  Heme-negative this admission TODAY: Hemoglobin stable at 10.7.  Having normal bowel movements.  No blood in stool or black stools since prior to admission.  From cardiac standpoint he feels better compared to when we saw him several days ago   CAD status post PCI Cardiogenic shock Moderate to severe mitral regurgitation  Mitral to severe tricuspid regurgitation  ICD Atrial fibrillation on Eliquis   CKD 3  History of CVA   Principal Problem:   Upper GI bleed Active Problems:   Cardiomyopathy (HCC)   History of non-Hodgkin's lymphoma   HLD (hyperlipidemia)   Abdominal pain   Benign hypertension   Bipolar 1 disorder (HCC)   Gout   OSA (obstructive sleep apnea)   Atrial fibrillation with rapid ventricular response (HCC)   Acute renal failure superimposed on stage 3a chronic kidney disease (HCC)   Anemia   Iron  deficiency anemia due to chronic blood loss   Paroxysmal atrial fibrillation (HCC)   Cardiac resynchronization therapy defibrillator (CRT-D) in place   Nonrheumatic tricuspid valve regurgitation   Marijuana abuse   History of cocaine abuse (HCC)   Atherosclerosis of native coronary artery of native heart without angina pectoris   Mitral valve disease    Pancytopenia (HCC)   Nonischemic cardiomyopathy (HCC)   Acute decompensated heart failure (HCC)    PLAN   --Will proceed with EGD and colonoscopy tomorrow.   Schedule for EGD and colonoscopy. The risks and benefits of EGD with possible biopsies and colonoscopy with possible biopsies and removal of polyps were discussed with the patient who agrees to proceed. Patient does understand that he is at increased risk for sedation/procedures given severe cardiac disease  Subjective   Having normal bowel movements.  No blood in stool or black stools since prior to admission.  From cardiac standpoint he feels better compared to when we saw him several days ago.  Eager to get home    Objective     Recent Labs    01/18/24 0940 01/19/24 0903 01/19/24 1015 01/20/24 0815  WBC 6.0 6.7  --  6.5  HGB 10.6* 11.0* 13.6  13.6 10.7*  HCT 35.3* 36.0* 40.0  40.0 35.9*  MCV 66.9* 66.1*  --  66.7*  PLT 233 271  --  303   No results for input(s): FOLATE, VITAMINB12, FERRITIN, TIBC, IRONPCTSAT in the last 72 hours. Recent Labs    01/18/24 0455 01/19/24 0351 01/19/24 1015 01/20/24 0500  NA 129* 126* 131*  131* 127*  K 3.8 3.6 3.5  3.4* 4.0  CL 87* 91*  --  87*  CO2 26 24  --  23  GLUCOSE 115* 86  --  93  BUN 22* 25*  --  27*  CREATININE 1.76* 1.85*  --  1.70*  CALCIUM   10.1 9.0  --  9.6   No results for input(s): PROT, ALBUMIN, AST, ALT, ALKPHOS, BILITOT, BILIDIR, IBILI in the last 72 hours.    Imaging:  CARDIAC CATHETERIZATION HEMODYNAMICS: RA:   1 mmHg (mean) RV:   14/1 mmHg PA:   16/7 mmHg (10 mean) PCWP:  5 mmHg (mean)     Estimated Fick CO/CI   5.4 L/min, 2.8 L/min/m2 Thermodilution CO/CI   3.8 L/min, 2.06 L/min/m2      TPG    5  mmHg      PVR     1-1.3 Wood Units  PAPi      9    IMPRESSION: Low pre and post capillary filling pressures Severely reduced cardiac index by TD secondary to underlying  cardiomyopathy & low filling pressures.   Normal PVR  Aditya Sabharwal 10:22 AM     Scheduled inpatient medications:   allopurinol   100 mg Oral Daily   amiodarone   200 mg Oral Daily   atorvastatin   80 mg Oral Daily   Chlorhexidine  Gluconate Cloth  6 each Topical Daily   digoxin   0.125 mg Oral Daily   lactulose   10 g Oral BID   midodrine   5 mg Oral TID WC   pantoprazole  (PROTONIX ) IV  40 mg Intravenous Q12H   QUEtiapine   200 mg Oral QHS   sodium chloride  flush  10-40 mL Intracatheter Q12H   sucralfate   1 g Oral TID WC & HS   tamsulosin   0.4 mg Oral QPC supper   topiramate   25 mg Oral Daily   Continuous inpatient infusions:  PRN inpatient medications: albuterol , methocarbamol , oxyCODONE , sodium chloride  flush  Vital signs in last 24 hours: Temp:  [98.2 F (36.8 C)-98.6 F (37 C)] 98.2 F (36.8 C) (09/17 1059) Pulse Rate:  [75-87] 78 (09/17 0938) Resp:  [14-19] 16 (09/17 1059) BP: (86-125)/(60-91) 101/67 (09/17 1059) SpO2:  [95 %-98 %] 97 % (09/17 1059) Weight:  [76.2 kg] 76.2 kg (09/17 0415) Last BM Date : 01/20/24  Intake/Output Summary (Last 24 hours) at 01/20/2024 1259 Last data filed at 01/20/2024 1100 Gross per 24 hour  Intake 729.34 ml  Output 1250 ml  Net -520.66 ml    Intake/Output from previous day: 09/16 0701 - 09/17 0700 In: 719.3 [P.O.:600; I.V.:119.3] Out: 1900 [Urine:1900] Intake/Output this shift: Total I/O In: 10 [I.V.:10] Out: 200 [Urine:200]   Physical Exam:  General: Alert male in NAD Heart:  Regular rate and rhythm.  Pulmonary: Normal respiratory effort Abdomen: Soft, nondistended, nontender. Normal bowel sounds. Extremities: No lower extremity edema  Neurologic: Alert and oriented Psych: Pleasant. Cooperative     LOS: 7 days   Vina Dasen ,NP 01/20/2024, 12:59 PM  -------------------------------------------------------------------------------------------------  I have taken a history, reviewed the chart and examined the patient. I performed a substantive portion of  this encounter, including complete performance of at least one of the key components, in conjunction with the APP. I agree with the APP's note, impression and recommendations.  58 year old male with significant cardiac comorbidities to include CAD s/p PCI, severe systolic heart failure s/p ICD, A-fib and CKD, admitted with upper abdominal pain and melenic stools and acute drop in hemoglobin.  Endoscopic evaluation deferred at the time due to severe heart failure and no active bleeding with stable hemoglobin.   Cardiology parameters have improved following diuresis and inotropic therapy and patient is now euvolemic and optimized for an elective sedated procedure.  Cleared from cardiology standpoint.   He has not had any  further melenic stools and has no GI symptoms currently.  His hgb has remained stable through his hospitalization.   He has never had a previous EGD or colonoscopy.  Not currently on any antiplatelet agents or anticoagulation (Eliquis  held since admission)  Plan for EGD and colonoscopy tomorrow.  I discussed the details, risks and benefits of the procedures with the patient and he wishes to proceed.  He understands he is at high risk of complications from sedation due to his cardiac comorbidities.     Lurlie Wigen E. Stacia, MD [facility]

## 2024-01-20 NOTE — Assessment & Plan Note (Addendum)
 Echocardiogram with reduced LV systolic function with EF 25 to 30%, global hypokinesis, moderate dilated LV cavity, RV with moderate reduction in systolic function, LA and RA with severe dilatation, moderate mitral valve regurgitation, tricuspid valve regurgitation moderate to severe.    09/16 cardiac catheterization  RA 1  RV 14/1 PA 16/7  PCWP 5  Cardiac output 5.4 and index 2.8 (fick) Cardiac output 3,8 and index 2,0 (thermodilution)  PVR 1 to 1,3   Patient was placed on IV furosemide  and milrinone  for diuresis, negative fluid balance was achieved, - 16,275 ml, with significant improvement in his symptoms.   Plan to continue medical therapy with digoxin  and SGLT 2 inh.  Holding RAAS inhibition until blood pressure and renal function more stable.  Blood pressure support with midodrine .  Limited medical therapy due to risk of hypotension.

## 2024-01-20 NOTE — Assessment & Plan Note (Signed)
 Hyponatremia. Hypokalemia.   At the time of discharge his renal function had a serum cr of 1,15 with K at 2,8 and serum bicarbonate at 26  Na 131   Patient will get K supplementation prior to discharge.  Follow up K prior to discharge.

## 2024-01-20 NOTE — Plan of Care (Signed)

## 2024-01-20 NOTE — Assessment & Plan Note (Signed)
 Continue allopurinol , no signs of acute flare.

## 2024-01-20 NOTE — Assessment & Plan Note (Signed)
 No signs of acute intoxication

## 2024-01-20 NOTE — Progress Notes (Signed)
 Progress Note   Patient: Cody Guzman FMW:[id number] DOB: 03-31-1966 DOA: 01/11/2024     7 DOS: the patient was seen and examined on 01/20/2024   Brief hospital course: Cody Guzman was admitted to the hospital with the working diagnosis of decompensated  hart failure.   58 y.o.  male with history of HFrEF-s/p ICD placement, atrial fibrillation, CVA, CKD stage IIIb-who presented to the hospital with several days history of upper abdominal discomfort/melanotic stools.  Patient was found to have upper GI bleeding with acute blood loss anemia. Cardiology, GI and Palliative teams consulted.On midodrine  and lasix  drips. Symptoms improving. Lasix  and midodrine  drips dced on 9/15. RHC done on 9/16. Heart failure team is on board.   Assessment and Plan: * Acute on chronic systolic CHF (congestive heart failure) (HCC) Echocardiogram with reduced LV systolic function with EF 25 to 30%, global hypokinesis, moderate dilated LV cavity, RV with moderate reduction in systolic function, LA and RA with severe dilatation, moderate mitral valve regurgitation, tricuspid valve regurgitation moderate to severe.    09/16 cardiac catheterization  RA 1  RV 14/1 PA 16/7  PCWP 5  Cardiac output 5.4 and index 2.8 (fick) Cardiac output 3,8 and index 2,0 (thermodilution)  PVR 1 to 1,3   09/16 off milrinone    Urine output 1,900 ml Systolic blood pressure 100 mmHg range.  Sv02 62.5   Plan to continue medical therapy with digoxin .  Blood pressure support with midodrine .  Limited medical therapy due to risk of hypotension.  Essential hypertension Continue blood pressure monitoring   Paroxysmal atrial fibrillation (HCC) Continue digoxin  and amiodarone    HLD (hyperlipidemia) Continue atorvastatin    Upper GI bleed Continue with pantoprazole .  Follow up with GI recommendations for EGD and colonoscopy.   History of non-Hodgkin's lymphoma Follow up as outpatient.   Bipolar 1 disorder (HCC) Continue  with quetiapine  and topiramate    Gout Continue allopurinol , no signs of acute flare.   OSA (obstructive sleep apnea) Continue Cpap   Acute renal failure superimposed on stage 3a chronic kidney disease (HCC) Hyponatremia.  Renal function with serum cr at 1.70 with K at 4,0 and serum bicarbonate at 23  Na 127   Iron  deficiency anemia due to chronic blood loss Cell count sable with hgb at 10.7   History of cocaine abuse (HCC) No signs of acute intoxication.         Subjective: Patient is feeling better, no chest pain, no dyspnea, no PND, orthopnea or lower extremity edema   Physical Exam: Vitals:   01/20/24 0027 01/20/24 0415 01/20/24 0724 01/20/24 0938  BP: 91/77 102/75 (!) 86/60   Pulse: 75 79  78  Resp: 17 19 14    Temp: 98.5 F (36.9 C) 98.2 F (36.8 C) 98.2 F (36.8 C)   TempSrc: Oral Oral Oral   SpO2:   96%   Weight:  76.2 kg    Height:       Neurology awake and alert ENT with mild pallor Cardiovascular with S1 and S2 present and regular with no gallops, or rubs, systolic murmur at the right lower sternal border Respiratory with no rales or wheezing, no rhonchi  Abdomen with no distention  No lower extremity edema   Data Reviewed:    Family Communication: no family at the bedside   Disposition: Status is: Inpatient Remains inpatient appropriate because: recovering heart failure   Planned Discharge Destination: Home     Author: Elidia Toribio Furnace, MD 01/20/2024 10:47 AM  For on call review [email or web address].

## 2024-01-20 NOTE — Assessment & Plan Note (Addendum)
 Continue with pantoprazole .  Follow up with GI recommendations for EGD and colonoscopy.

## 2024-01-20 NOTE — Assessment & Plan Note (Signed)
 Follow up as outpatient
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
## 2024-01-20 NOTE — Assessment & Plan Note (Signed)
 Telemetry with atrial flutter 2:1 conduction.  Patient has a CRT-D but not effective BiV pacing while in atrial fibrillation/ flutter.   Continue digoxin  and amiodarone   Plan to resume anticoagulation next week.  Outpatient referral for EP possible atrial flutter ablation.

## 2024-01-20 NOTE — Assessment & Plan Note (Signed)
 Continue atorvastatin 

## 2024-01-20 NOTE — Assessment & Plan Note (Signed)
 Continue losartan  for blood pressure control.

## 2024-01-20 NOTE — Progress Notes (Signed)
 Physical Therapy Treatment Patient Details Name: Cody Guzman MRN: [id number] DOB: [birth year] Today's Date: 01/20/2024   History of Present Illness Pt is 58 yo presenting to [facility] on 9/8 due to upper abdominal discomfort/melanotic stools. Found to have upper GIB with acute blood loss anemia. Plan for EGD/Colonoscopy 01/21/24. PMHx: CAD, ICM, non-hodgkin's lymphoma in remmision, migraines, CKD2, CVA, HFrEF s/p ICD placement.    PT Comments  Pt received in supine, drowsy but awoken with increased time, pt agreeable to therapy session and with good participation. Pt needing up to Supervision for safety with gait and up to minA for stairs with single rail with HHA on opposite side for support/stability, no buckling. Pt slightly unsteady without AD but no overt LOB, no acute s/sx distress other than c/o abdominal pain, RN notified. Pt continues to benefit from PT services to progress toward functional mobility goals, continue to recommend staff get him OOB and mobilizing in hallway at least 3x/day to maintain/progress strength and endurance.     If plan is discharge home, recommend the following: Assist for transportation;Assistance with cooking/housework;Other (comment);Help with stairs or ramp for entrance   Can travel by private vehicle        Equipment Recommendations  None recommended by PT    Recommendations for Other Services       Precautions / Restrictions Precautions Precautions: Fall Recall of Precautions/Restrictions: Intact Precaution/Restrictions Comments: Remains drowsy during the day per staff Restrictions Weight Bearing Restrictions Per Provider Order: No     Mobility  Bed Mobility Overal bed mobility: Independent                  Transfers Overall transfer level: Modified independent Equipment used: None Transfers: Sit to/from Stand, Bed to chair/wheelchair/BSC Sit to Stand: Modified independent (Device/Increase time)           General transfer  comment: EOB, no AD x5 reciprocal with arms crossed at chest, no LOB or buckling, a few more reps from EOB also    Ambulation/Gait Ambulation/Gait assistance: Supervision Gait Distance (Feet): 300 Feet Assistive device: None Gait Pattern/deviations: Step-through pattern, Decreased stride length, Drifts right/left, Narrow base of support Gait velocity: decreased Gait velocity interpretation: 1.31 - 2.62 ft/sec, indicative of limited community ambulator   General Gait Details: no acute s/sx distress, but mild drift in hallway and narrow BOS, occasional tandem steps which increases his risk of falls but no overt LOB. Pt able to perform head turns, speed changes without imbalance and named months from Dec-Jan without increased imbalance, did miss one month (march). HR WFL.   Stairs Stairs: Yes Stairs assistance: Min assist Stair Management: One rail Right, Step to pattern, Forwards, Backwards Number of Stairs: 5 (x3 sets) General stair comments: 7 step x5 reps x3 sets, seated break after first 10 reps, then 5 more. RUE rail and LUE HHA for 5 reps, then LUE rail and RUE HHA for other 10 reps. minA on HHA side for stability, no buckling. VSS, no dizziness.   Wheelchair Mobility     Tilt Bed    Modified Rankin (Stroke Patients Only)       Balance Overall balance assessment: Mild deficits observed, not formally tested                                          Communication Communication Communication: No apparent difficulties  Cognition Arousal: Lethargic, Alert (lethargic initially, more alert  after standing up in room) Behavior During Therapy: [facility] for tasks assessed/performed   PT - Cognitive impairments: No apparent impairments                         Following commands: Intact      Cueing Cueing Techniques: Verbal cues  Exercises Other Exercises Other Exercises: STS x 5 from EOB arms crossed at chest Other Exercises: step up/down 7 platform  x15 reps, rests after each set of 5 reps    General Comments        Pertinent Vitals/Pain Pain Assessment Pain Assessment: 0-10 Pain Score: 6  Pain Location: abdomen Pain Descriptors / Indicators: Sharp Pain Intervention(s): Monitored during session, Patient requesting pain meds-RN notified    Home Living                          Prior Function            PT Goals (current goals can now be found in the care plan section) Acute Rehab PT Goals Patient Stated Goal: to return home. PT Goal Formulation: With patient Time For Goal Achievement: 01/29/24 Progress towards PT goals: Progressing toward goals    Frequency    Min 1X/week      PT Plan      Co-evaluation              AM-PAC PT 6 Clicks Mobility   Outcome Measure  Help needed turning from your back to your side while in a flat bed without using bedrails?: None Help needed moving from lying on your back to sitting on the side of a flat bed without using bedrails?: None Help needed moving to and from a bed to a chair (including a wheelchair)?: None Help needed standing up from a chair using your arms (e.g., wheelchair or bedside chair)?: None Help needed to walk in hospital room?: A Little Help needed climbing 3-5 steps with a railing? : A Little 6 Click Score: 22    End of Session Equipment Utilized During Treatment:  (pt defers gait belt) Activity Tolerance: Patient tolerated treatment well;Patient limited by fatigue Patient left: in bed;with call bell/phone within reach;Other (comment) (Ok to leave bed alarm off per RN; pt aware to use call bell) Nurse Communication: Mobility status;Patient requests pain meds;Other (comment) (asking about his colonoscopy prep timing) PT Visit Diagnosis: Unsteadiness on feet (R26.81)     Time: 8498-8480 PT Time Calculation (min) (ACUTE ONLY): 18 min  Charges:    $Gait Training: 8-22 mins PT General Charges $$ ACUTE PT VISIT: 1 Visit                      Jesilyn Easom P., PTA Acute Rehabilitation Services Secure Chat Preferred 9a-5:30pm Office: [phone]    Cody Guzman 01/20/2024, 3:36 PM

## 2024-01-20 NOTE — Assessment & Plan Note (Signed)
 Cell count sable with hgb at 10.7

## 2024-01-21 ENCOUNTER — Inpatient Hospital Stay (HOSPITAL_COMMUNITY): Payer: Self-pay | Admitting: Anesthesiology

## 2024-01-21 ENCOUNTER — Other Ambulatory Visit (HOSPITAL_COMMUNITY): Payer: Self-pay

## 2024-01-21 ENCOUNTER — Encounter (HOSPITAL_COMMUNITY): Payer: Self-pay | Admitting: Internal Medicine

## 2024-01-21 ENCOUNTER — Encounter (HOSPITAL_COMMUNITY)
Admission: EM | Disposition: A | Discharge status: Home / Self Care | Payer: Self-pay | Source: Home / Self Care | Attending: Internal Medicine

## 2024-01-21 DIAGNOSIS — K640 First degree hemorrhoids: Secondary | ICD-10-CM

## 2024-01-21 DIAGNOSIS — N179 Acute kidney failure, unspecified: Secondary | ICD-10-CM

## 2024-01-21 DIAGNOSIS — K317 Polyp of stomach and duodenum: Principal | ICD-10-CM

## 2024-01-21 DIAGNOSIS — M109 Gout, unspecified: Secondary | ICD-10-CM

## 2024-01-21 DIAGNOSIS — K2980 Duodenitis without bleeding: Secondary | ICD-10-CM

## 2024-01-21 DIAGNOSIS — K635 Polyp of colon: Secondary | ICD-10-CM

## 2024-01-21 DIAGNOSIS — I10 Essential (primary) hypertension: Secondary | ICD-10-CM | POA: Diagnosis not present

## 2024-01-21 DIAGNOSIS — I8500 Esophageal varices without bleeding: Secondary | ICD-10-CM

## 2024-01-21 DIAGNOSIS — K2950 Unspecified chronic gastritis without bleeding: Secondary | ICD-10-CM

## 2024-01-21 DIAGNOSIS — G4733 Obstructive sleep apnea (adult) (pediatric): Secondary | ICD-10-CM

## 2024-01-21 DIAGNOSIS — I5023 Acute on chronic systolic (congestive) heart failure: Secondary | ICD-10-CM | POA: Diagnosis not present

## 2024-01-21 DIAGNOSIS — D509 Iron deficiency anemia, unspecified: Secondary | ICD-10-CM

## 2024-01-21 DIAGNOSIS — I8510 Secondary esophageal varices without bleeding: Secondary | ICD-10-CM

## 2024-01-21 DIAGNOSIS — K3189 Other diseases of stomach and duodenum: Secondary | ICD-10-CM

## 2024-01-21 DIAGNOSIS — N1831 Chronic kidney disease, stage 3a: Secondary | ICD-10-CM

## 2024-01-21 DIAGNOSIS — I480 Paroxysmal atrial fibrillation: Secondary | ICD-10-CM | POA: Diagnosis not present

## 2024-01-21 DIAGNOSIS — I2510 Atherosclerotic heart disease of native coronary artery without angina pectoris: Secondary | ICD-10-CM

## 2024-01-21 DIAGNOSIS — K922 Gastrointestinal hemorrhage, unspecified: Secondary | ICD-10-CM | POA: Diagnosis not present

## 2024-01-21 DIAGNOSIS — D122 Benign neoplasm of ascending colon: Secondary | ICD-10-CM

## 2024-01-21 HISTORY — PX: ESOPHAGOGASTRODUODENOSCOPY: SHX5428

## 2024-01-21 HISTORY — PX: COLONOSCOPY: SHX5424

## 2024-01-21 LAB — COOXEMETRY PANEL
Carboxyhemoglobin: 2.5 % — ABNORMAL HIGH (ref 0.5–1.5)
Methemoglobin: <0.7 % (ref 0.0–1.5)
O2 Saturation: 71.9 %
Total hemoglobin: 11.2 g/dL — ABNORMAL LOW (ref 12.0–16.0)

## 2024-01-21 LAB — BASIC METABOLIC PANEL WITH GFR
Anion gap: 13 (ref 5–15)
Anion gap: 7 (ref 5–15)
Anion gap: 13 (ref 5–15)
BUN: 19 mg/dL (ref 6–20)
BUN: 18 mg/dL (ref 6–20)
BUN: 18 mg/dL (ref 6–20)
CO2: 26 mmol/L (ref 22–32)
CO2: 25 mmol/L (ref 22–32)
CO2: 26 mmol/L (ref 22–32)
Calcium: 9.2 mg/dL (ref 8.9–10.3)
Calcium: 8.7 mg/dL — ABNORMAL LOW (ref 8.9–10.3)
Calcium: 9.5 mg/dL (ref 8.9–10.3)
Chloride: 92 mmol/L — ABNORMAL LOW (ref 98–111)
Chloride: 94 mmol/L — ABNORMAL LOW (ref 98–111)
Chloride: 93 mmol/L — ABNORMAL LOW (ref 98–111)
Creatinine, Ser: 1.15 mg/dL (ref 0.61–1.24)
Creatinine, Ser: 1.23 mg/dL (ref 0.61–1.24)
Creatinine, Ser: 1.5 mg/dL — ABNORMAL HIGH (ref 0.61–1.24)
GFR, Estimated: >60 mL/min (ref >60)
GFR, Estimated: >60 mL/min (ref >60)
GFR, Estimated: 53 mL/min — ABNORMAL LOW (ref >60)
Glucose, Bld: 71 mg/dL (ref 70–99)
Glucose, Bld: 170 mg/dL — ABNORMAL HIGH (ref 70–99)
Glucose, Bld: 58 mg/dL — ABNORMAL LOW (ref 70–99)
Potassium: 2.8 mmol/L — ABNORMAL LOW (ref 3.5–5.1)
Potassium: >7.5 mmol/L (ref 3.5–5.1)
Potassium: 3.3 mmol/L — ABNORMAL LOW (ref 3.5–5.1)
Sodium: 131 mmol/L — ABNORMAL LOW (ref 135–145)
Sodium: 126 mmol/L — ABNORMAL LOW (ref 135–145)
Sodium: 132 mmol/L — ABNORMAL LOW (ref 135–145)

## 2024-01-21 LAB — CBC
HCT: 35.5 % — ABNORMAL LOW (ref 39.0–52.0)
Hemoglobin: 10.5 g/dL — ABNORMAL LOW (ref 13.0–17.0)
MCH: 19.8 pg — ABNORMAL LOW (ref 26.0–34.0)
MCHC: 29.6 g/dL — ABNORMAL LOW (ref 30.0–36.0)
MCV: 67.1 fL — ABNORMAL LOW (ref 80.0–100.0)
Platelets: 312 K/uL (ref 150–400)
RBC: 5.29 MIL/uL (ref 4.22–5.81)
RDW: 21.8 % — ABNORMAL HIGH (ref 11.5–15.5)
WBC: 4.5 K/uL (ref 4.0–10.5)
nRBC: 0 % (ref 0.0–0.2)

## 2024-01-21 SURGERY — COLONOSCOPY
Anesthesia: Monitor Anesthesia Care

## 2024-01-21 MED ORDER — PANTOPRAZOLE SODIUM 40 MG PO TBEC
40.0000 mg | DELAYED_RELEASE_TABLET | Freq: Two times a day (BID) | ORAL | Status: DC
  Filled 2024-01-21: qty 1

## 2024-01-21 MED ORDER — DIGOXIN 125 MCG PO TABS
0.1250 mg | ORAL_TABLET | Freq: Every day | ORAL | 0 refills | Status: DC
  Filled 2024-01-21: qty 30, 30d supply, fill #0

## 2024-01-21 MED ORDER — ALBUTEROL SULFATE HFA 108 (90 BASE) MCG/ACT IN AERS
1.0000 | INHALATION_SPRAY | Freq: Four times a day (QID) | RESPIRATORY_TRACT | 0 refills | Status: DC | PRN
  Filled 2024-01-21: qty 18, 30d supply, fill #0

## 2024-01-21 MED ORDER — POTASSIUM CHLORIDE CRYS ER 20 MEQ PO TBCR
40.0000 meq | EXTENDED_RELEASE_TABLET | ORAL | Status: AC
Start: 2024-01-21 — End: 2024-01-21
  Administered 2024-01-21 (×2): 40 meq via ORAL
  Filled 2024-01-21 (×2): qty 2

## 2024-01-21 MED ORDER — ALLOPURINOL 100 MG PO TABS
100.0000 mg | ORAL_TABLET | Freq: Every day | ORAL | 0 refills | Status: DC
  Filled 2024-01-21: qty 30, 30d supply, fill #0

## 2024-01-21 MED ORDER — MIDODRINE HCL 5 MG PO TABS
2.5000 mg | ORAL_TABLET | Freq: Three times a day (TID) | ORAL | Status: DC
  Administered 2024-01-21: 2.5 mg via ORAL
  Filled 2024-01-21: qty 1

## 2024-01-21 MED ORDER — EMPAGLIFLOZIN 10 MG PO TABS
10.0000 mg | ORAL_TABLET | Freq: Every day | ORAL | Status: DC
  Administered 2024-01-21: 10 mg via ORAL
  Filled 2024-01-21: qty 1

## 2024-01-21 MED ORDER — MIDODRINE HCL 2.5 MG PO TABS
2.5000 mg | ORAL_TABLET | Freq: Three times a day (TID) | ORAL | 0 refills | Status: DC
  Filled 2024-01-21: qty 90, 30d supply, fill #0

## 2024-01-21 MED ORDER — EMPAGLIFLOZIN 10 MG PO TABS
10.0000 mg | ORAL_TABLET | Freq: Every day | ORAL | 0 refills | Status: DC
  Filled 2024-01-21: qty 30, 30d supply, fill #0

## 2024-01-21 MED ORDER — POTASSIUM CHLORIDE 10 MEQ/50ML IV SOLN
10.0000 meq | INTRAVENOUS | Status: AC
  Administered 2024-01-21 (×4): 10 meq via INTRAVENOUS
  Filled 2024-01-21 (×4): qty 50

## 2024-01-21 MED ORDER — TAMSULOSIN HCL 0.4 MG PO CAPS
0.4000 mg | ORAL_CAPSULE | Freq: Every day | ORAL | 0 refills | Status: DC
  Filled 2024-01-21: qty 30, 30d supply, fill #0

## 2024-01-21 MED ORDER — PANTOPRAZOLE SODIUM 40 MG PO TBEC
40.0000 mg | DELAYED_RELEASE_TABLET | Freq: Two times a day (BID) | ORAL | 0 refills | Status: DC
  Filled 2024-01-21: qty 60, 30d supply, fill #0

## 2024-01-21 MED ORDER — IRON SUCROSE 500 MG IVPB - SIMPLE MED
500.0000 mg | Freq: Once | INTRAVENOUS | Status: DC
Start: 2024-01-21 — End: 2024-01-21
  Filled 2024-01-21: qty 275

## 2024-01-21 MED ORDER — PROPOFOL 500 MG/50ML IV EMUL
INTRAVENOUS | Status: DC | PRN
  Administered 2024-01-21: 150 ug/kg/min via INTRAVENOUS

## 2024-01-21 MED ORDER — ATORVASTATIN CALCIUM 80 MG PO TABS
80.0000 mg | ORAL_TABLET | Freq: Every day | ORAL | 0 refills | Status: DC
  Filled 2024-01-21: qty 30, 30d supply, fill #0

## 2024-01-21 MED ORDER — SODIUM CHLORIDE 0.9 % IV SOLN
500.0000 mg | Freq: Once | INTRAVENOUS | Status: AC
  Administered 2024-01-21: 500 mg via INTRAVENOUS
  Filled 2024-01-21: qty 25

## 2024-01-21 MED ORDER — AMIODARONE HCL 200 MG PO TABS
200.0000 mg | ORAL_TABLET | Freq: Every day | ORAL | 0 refills | Status: DC
  Filled 2024-01-21: qty 30, 30d supply, fill #0

## 2024-01-21 MED ORDER — QUETIAPINE FUMARATE 100 MG PO TABS
100.0000 mg | ORAL_TABLET | Freq: Two times a day (BID) | ORAL | 0 refills | Status: DC
  Filled 2024-01-21: qty 30, 30d supply, fill #0
  Filled 2024-01-21: qty 60, 30d supply, fill #0

## 2024-01-21 MED ORDER — COLCHICINE 0.6 MG PO TABS
0.6000 mg | ORAL_TABLET | ORAL | 0 refills | Status: DC
  Filled 2024-01-21: qty 15, 30d supply, fill #0

## 2024-01-21 MED ORDER — TOPIRAMATE 25 MG PO TABS
25.0000 mg | ORAL_TABLET | Freq: Every day | ORAL | 0 refills | Status: DC
  Filled 2024-01-21: qty 30, 30d supply, fill #0

## 2024-01-21 NOTE — Anesthesia Postprocedure Evaluation (Signed)
 Anesthesia Post Note  Patient: Cody Guzman  Procedure(s) Performed: COLONOSCOPY EGD (ESOPHAGOGASTRODUODENOSCOPY)     Patient location during evaluation: PACU Anesthesia Type: MAC Level of consciousness: awake and alert Pain management: pain level controlled Vital Signs Assessment: post-procedure vital signs reviewed and stable Respiratory status: spontaneous breathing, nonlabored ventilation and respiratory function stable Cardiovascular status: stable and blood pressure returned to baseline Anesthetic complications: no   No notable events documented.  Last Vitals:  Vitals:   01/21/24 0940 01/21/24 0950  BP: 100/78 105/64  Pulse: 70 (!) 46  Resp: 14 15  Temp:    SpO2: 99% 100%    Last Pain:  Vitals:   01/21/24 0950  TempSrc:   PainSc: 0-No pain                 Debby FORBES Like

## 2024-01-21 NOTE — Discharge Summary (Addendum)
 Physician Discharge Summary   Patient: Cody Guzman MRN: [id number] DOB: 08-16-1965  Admit date:     01/11/2024  Discharge date: 01/21/24  Discharge Physician: Cody Guzman   PCP: Cody Guzman ORN, NP   Recommendations at discharge:    Patient has been placed on pantoprazole  40 mg po bid for 4 weeks and follow up as outpatient with GI. Amiodarone  was increased to 200 mg and started on Digoxin , to resume apixaban  next week after follow up with Cardiology. Patient may benefit from atrial flutter ablation as outpatient.  Resumed SGLT 2 inh, holding on RAAS inhibition until blood pressure and renal function more stable.  Blood pressure support with midodrine , low dose and taper as outpatient.  Follow up renal function and electrolytes in 7 days  Follow up with Cody Fleming NP in 7 to 10 days Follow up with Cardiology and Gastroenterology as scheduled.   Discharge Diagnoses: Principal Problem:   Acute on chronic systolic CHF (congestive heart failure) (HCC) Active Problems:   Essential hypertension   Paroxysmal atrial fibrillation (HCC)   HLD (hyperlipidemia)   Upper GI bleed   History of non-Hodgkin's lymphoma   Bipolar 1 disorder (HCC)   Gout   OSA (obstructive sleep apnea)   Acute renal failure superimposed on stage 3a chronic kidney disease (HCC)   Iron  deficiency anemia due to chronic blood loss   History of cocaine abuse (HCC)   Melena   Secondary esophageal varices without bleeding (HCC)   Gastric polyps   Benign neoplasm of ascending colon  Resolved Problems:   * No resolved hospital problems. [facility] Course: [name] was admitted to the hospital with the working diagnosis of decompensated  hart failure.   58 y.o.  male with history of heart failure, atrial fibrillation, CVA, CKD stage IIIb-who presented to the hospital with several days history of upper abdominal discomfort/melanotic stools. Noted not adherent to his medical therapy at home.    On his initial physical examination his HR was 120 to 135, blood pressure 104/79, RR 24 and 02 saturation 100%  Lungs with no rales or rhonchi, heart with S1 and S2 present, irregularly irregular, abdomen with no distention and non tender, no lower extremity edema.   Na 134, K 4.2 Cl 103 bicarbonate 16 glucose 84 bun 24 cr 1,76  High sensitive troponin 55 and 42  Wbc 6,2 hgb 8,6 plt 186  Urine analysis SG 1,017, protein 100, negative leukocytes and negative hgb.   Chest radiograph with hypoinflation, positive cardiomegaly, bilateral hilar vascular congestion, pacemaker defibrillator in place with one right atrial led and biventricular leads.   Patient was found to have upper GI bleeding with acute blood loss anemia.  Low cardiac output and placed on midodrine  and lasix  drips. Symptoms improving. Lasix  and midodrine  drips dced on 9/15.  RHC done on 9/16 with improved filling pressures.  09/18 EGD and colonoscopy with friable gastric polyps, likely source of bleeding.  Positive colonic polyps.  Plan for discharge home and possible outpatient cardioversion.   Assessment and Plan: * Acute on chronic systolic CHF (congestive heart failure) (HCC) Echocardiogram with reduced LV systolic function with EF 25 to 30%, global hypokinesis, moderate dilated LV cavity, RV with moderate reduction in systolic function, LA and RA with severe dilatation, moderate mitral valve regurgitation, tricuspid valve regurgitation moderate to severe.    09/16 cardiac catheterization  RA 1  RV 14/1 PA 16/7  PCWP 5  Cardiac output 5.4 and index 2.8 (fick) Cardiac output  3,8 and index 2,0 (thermodilution)  PVR 1 to 1,3   Patient was placed on IV furosemide  and milrinone  for diuresis, negative fluid balance was achieved, - 16,275 ml, with significant improvement in his symptoms.   Plan to continue medical therapy with digoxin  and SGLT 2 inh.  Holding RAAS inhibition until blood pressure and renal function more  stable.  Blood pressure support with midodrine .  Limited medical therapy due to risk of hypotension.  Essential hypertension Continue blood pressure monitoring  Midodrine  for blood pressure support.   Paroxysmal atrial fibrillation (HCC) Telemetry with atrial flutter 2:1 conduction.  Patient has a CRT-D but not effective BiV pacing while in atrial fibrillation/ flutter.   Continue digoxin  and amiodarone   Plan to resume anticoagulation next week.  Outpatient referral for EP possible atrial flutter ablation.   Acute renal failure superimposed on stage 3a chronic kidney disease (HCC) Hyponatremia. Hypokalemia.   At the time of discharge his renal function had a serum cr of 1,15 with K at 2,8 and serum bicarbonate at 26  Na 131   Patient will get K supplementation prior to discharge.  Follow up K prior to discharge.   Upper GI bleed Acute blood loose anemia with iron  deficiency.  Patient was placed on pantoprazole .   Underwent EGD:  Grade I esophageal varices (liver cirrhosis).  Three gastric polyps. (Resected and retrieved)  Two were friable and likely source of bleeding.   Colonoscopy with five 2 to 10 cm polyps in the ascending colon, removed.  Two 3 to 5 mm polyps in the transverse colon, removed.  One 12 mm polyp in the sigmoid colon, removed.   Continue pantoprazole  for 4 weeks, 40 mg po bid.  Follow up with GI as outpatient.   Follow up hgb is 10.5  Iron  16, TIBC 497, transferrin saturation 3 and ferritin 10.  IV iron  sucrose 500 mg IV x1  Follow up iron  panel and cell count as outpatient.   History of non-Hodgkin's lymphoma Follow up as outpatient.   Bipolar 1 disorder (HCC) Continue with quetiapine  and topiramate    Gout Continue allopurinol , no signs of acute flare.   OSA (obstructive sleep apnea) Continue Cpap   HLD (hyperlipidemia) Continue atorvastatin    Iron  deficiency anemia due to chronic blood loss Cell count sable with hgb at 10.7   History  of cocaine abuse (HCC) No signs of acute intoxication.       Consultants: Cardiology, GI and palliative care  Procedures performed: right heart catheterization, EGD and colonoscopy   Disposition: Home Diet recommendation:  Cardiac diet DISCHARGE MEDICATION: Allergies as of 01/21/2024   No Known Allergies      Medication List     STOP taking these medications    Eliquis  5 MG Tabs tablet Generic drug: apixaban    fluticasone  50 MCG/ACT nasal spray Commonly known as: FLONASE    irbesartan  75 MG tablet Commonly known as: AVAPRO    loratadine  10 MG tablet Commonly known as: CLARITIN    metoprolol  succinate 50 MG 24 hr tablet Commonly known as: TOPROL -XL   nitroGLYCERIN  0.4 MG SL tablet Commonly known as: NITROSTAT    potassium chloride  SA 20 MEQ tablet Commonly known as: KLOR-CON  M   QUEtiapine  300 MG 24 hr tablet Commonly known as: SEROQUEL  XR Replaced by: QUEtiapine  100 MG tablet   senna 8.6 MG tablet Commonly known as: SENOKOT   spironolactone  25 MG tablet Commonly known as: ALDACTONE    SUMAtriptan  50 MG tablet Commonly known as: Imitrex    torsemide  20 MG tablet  Commonly known as: DEMADEX        TAKE these medications    albuterol  108 (90 Base) MCG/ACT inhaler Commonly known as: VENTOLIN  HFA Inhale 1-2 puffs into the lungs every 6 (six) hours as needed for wheezing or shortness of breath.   allopurinol  100 MG tablet Commonly known as: ZYLOPRIM  Take 1 tablet (100 mg total) by mouth daily.   amiodarone  200 MG tablet Commonly known as: PACERONE  Take 1 tablet (200 mg total) by mouth daily. Start taking on: January 22, 2024 What changed: how much to take   atorvastatin  80 MG tablet Commonly known as: LIPITOR  Take 1 tablet (80 mg total) by mouth daily.   colchicine  0.6 MG tablet Take 1 tablet (0.6 mg total) by mouth every other day.   digoxin  0.125 MG tablet Commonly known as: LANOXIN  Take 1 tablet (0.125 mg total) by mouth daily. Start  taking on: January 22, 2024   empagliflozin  10 MG Tabs tablet Commonly known as: Jardiance  Take 1 tablet (10 mg total) by mouth daily before breakfast.   midodrine  2.5 MG tablet Commonly known as: PROAMATINE  Take 1 tablet (2.5 mg total) by mouth 3 (three) times daily with meals.   pantoprazole  40 MG tablet Commonly known as: PROTONIX  Take 1 tablet (40 mg total) by mouth 2 (two) times daily.   QUEtiapine  100 MG tablet Commonly known as: SEROQUEL  Take 1 tablet (100 mg total) by mouth 2 (two) times daily. Replaces: QUEtiapine  300 MG 24 hr tablet   tamsulosin  0.4 MG Caps capsule Commonly known as: FLOMAX  Take 1 capsule (0.4 mg total) by mouth daily.   topiramate  25 MG tablet Commonly known as: TOPAMAX  Take 1 tablet (25 mg total) by mouth daily. Start taking on: January 22, 2024        Follow-up Information     Cody Guzman ORN, NP Follow up.   Specialty: Nurse Practitioner Contact information: [address] [place] [address] [place] [address] [phone]                Discharge Exam: Fredricka Weights   01/20/24 0415 01/21/24 0426 01/21/24 0724  Weight: 76.2 kg 76.1 kg 75.8 kg   BP 105/77   Pulse 74   Temp (!) 96.6 F (35.9 C) (Temporal)   Resp 20   Ht 5' 8 (1.727 m)   Wt 75.8 kg   SpO2 98%   BMI 25.39 kg/m   Patient is feeling well, no chest pain and no dyspnea, no abdominal pain, no nausea or vomiting, no melena.   Neurology awake and alert ENT with mild pallor Cardiovascular with S1 and S2 present, irregularly irregular with no gallops or rubs. Positive systolic murmur at the left lower sternal border and apex. No JVD Respiratory with no rales or wheezing, no rhonchi  Abdomen soft and non tender, not distended No lower extremity edema   Condition at discharge: stable  The results of significant diagnostics from this hospitalization (including imaging, microbiology, ancillary and laboratory) are listed below for reference.   Imaging  Studies: CARDIAC CATHETERIZATION Result Date: 01/19/2024 HEMODYNAMICS: RA:   1 mmHg (mean) RV:   14/1 mmHg PA:   16/7 mmHg (10 mean) PCWP:  5 mmHg (mean)    Estimated Fick CO/CI   5.4 L/min, 2.8 L/min/m2 Thermodilution CO/CI   3.8 L/min, 2.06 L/min/m2    TPG    5  mmHg     PVR     1-1.3 Wood Units PAPi      9  IMPRESSION:  Low pre and post capillary filling pressures Severely reduced cardiac index by TD secondary to underlying cardiomyopathy & low filling pressures. Normal PVR Aditya Sabharwal 10:22 AM  DG Chest Port 1 View Result Date: 01/15/2024 EXAM: 1 VIEW XRAY OF THE CHEST 01/15/2024 10:00:00 AM COMPARISON: 01/13/2024 CLINICAL HISTORY: PICC (peripherally inserted central catheter) in place [id number]. PICC placement. FINDINGS: LINES, TUBES AND DEVICES: Right upper extremity PICC in place with distal tip at superior cavoatrial junction. Stable left chest wall ICD. LUNGS AND PLEURA: Mild pulmonary edema is new from prior. No focal pulmonary opacity. No pleural effusion. No pneumothorax. HEART AND MEDIASTINUM: Stable cardiomegaly. No acute abnormality of the cardiac and mediastinal silhouettes. BONES AND SOFT TISSUES: No acute osseous abnormality. IMPRESSION: 1. Mild pulmonary edema, new from prior. 2. Stable cardiomegaly. 3. Right upper extremity PICC in place with distal tip at superior cavoatrial junction. Electronically signed by: Donnice Mania MD 01/15/2024 10:34 AM EDT RP Workstation: [id number]   US  EKG SITE RITE Result Date: 01/14/2024 If Site Rite image not attached, placement could not be confirmed due to current cardiac rhythm.  ECHOCARDIOGRAM COMPLETE Result Date: 01/13/2024    ECHOCARDIOGRAM REPORT   Patient Name:   Gurkaran Akerele-Ale Date of Exam: 01/13/2024 Medical Rec #:  [id number]           Height:       68.0 in Accession #:    [id number]          Weight:       192.0 lb Date of Birth:  08-16-1965            BSA:          2.009 m Patient Age:    58 years            BP:           88/70 mmHg  Patient Gender: M                   HR:           75 bpm. Exam Location:  Inpatient Procedure: 2D Echo, Cardiac Doppler and Color Doppler (Both Spectral and Color            Flow Doppler were utilized during procedure). Indications:    Nonischemic Cardiomyopathy I42.8  History:        Patient has prior history of Echocardiogram examinations, most                 recent 08/27/2020. CHF and Cardiomegaly, Arrythmias:RBBB, Atrial                 Fibrillation and Atrial Flutter, Signs/Symptoms:Chest Pain,                 Syncope and Shortness of Breath; Risk Factors:Hypertension.  Sonographer:    BERNARDA ROCKS Referring Phys: AMY S ESTERWOOD IMPRESSIONS  1. Left ventricular ejection fraction, by estimation, is 25 to 30%. Left ventricular ejection fraction by 2D MOD biplane is 30.0 %. The left ventricle has severely decreased function. The left ventricle demonstrates global hypokinesis. The left ventricular internal cavity size was moderately dilated. Left ventricular diastolic function could not be evaluated.  2. Right ventricular systolic function is moderately reduced. The right ventricular size is normal. A pacemaker lead noted in the mid-intraventricular septum is visualized. Tricuspid regurgitation signal is inadequate for assessing PA pressure. The estimated right ventricular systolic pressure is 29.0 mmHg.  3. Left atrial size was severely dilated.  4. Right atrial size was  severely dilated.  5. There is no evidence of cardiac tamponade.  6. The mitral valve is abnormal. Moderate mitral valve regurgitation.  7. The tricuspid valve is abnormal. Tricuspid valve regurgitation is moderate to severe.  8. The aortic valve is tricuspid. Aortic valve regurgitation is not visualized.  9. The inferior vena cava is dilated in size with <50% respiratory variability, suggesting right atrial pressure of 15 mmHg. Comparison(s): Changes from prior study are noted. 08/27/2020: LVEF <20%. FINDINGS  Left Ventricle: Left ventricular  ejection fraction, by estimation, is 25 to 30%. Left ventricular ejection fraction by 2D MOD biplane is 30.0 %. The left ventricle has severely decreased function. The left ventricle demonstrates global hypokinesis. The left ventricular internal cavity size was moderately dilated. There is no left ventricular hypertrophy. Left ventricular diastolic function could not be evaluated due to paced rhythm. Left ventricular diastolic function could not be evaluated. Right Ventricle: The right ventricular size is normal. No increase in right ventricular wall thickness. Right ventricular systolic function is moderately reduced. Tricuspid regurgitation signal is inadequate for assessing PA pressure. The tricuspid regurgitant velocity is 1.87 m/s, and with an assumed right atrial pressure of 15 mmHg, the estimated right ventricular systolic pressure is 29.0 mmHg. Left Atrium: Left atrial size was severely dilated. Right Atrium: Right atrial size was severely dilated. Pericardium: Trivial pericardial effusion is present. The pericardial effusion is circumferential. There is no evidence of cardiac tamponade. Mitral Valve: The mitral valve is abnormal. There is moderate thickening of the anterior and posterior mitral valve leaflet(s). Moderate mitral valve regurgitation, with eccentric posteriorly directed jet. MV peak gradient, 6.4 mmHg. The mean mitral valve gradient is 3.0 mmHg. Tricuspid Valve: The tricuspid valve is abnormal. Tricuspid valve regurgitation is moderate to severe. Aortic Valve: The aortic valve is tricuspid. Aortic valve regurgitation is not visualized. Aortic valve mean gradient measures 4.0 mmHg. Aortic valve peak gradient measures 8.2 mmHg. Aortic valve area, by VTI measures 1.90 cm. Pulmonic Valve: The pulmonic valve was grossly normal. Pulmonic valve regurgitation is trivial. Aorta: The aortic root and ascending aorta are structurally normal, with no evidence of dilitation. Venous: The inferior vena cava  is dilated in size with less than 50% respiratory variability, suggesting right atrial pressure of 15 mmHg. IAS/Shunts: No atrial level shunt detected by color flow Doppler. Additional Comments: A pacemaker lead noted in the mid-intraventricular septum is visualized.  LEFT VENTRICLE PLAX 2D                        Biplane EF (MOD) LVIDd:         6.20 cm         LV Biplane EF:   Left LVIDs:         5.40 cm                          ventricular LV PW:         0.70 cm                          ejection LV IVS:        0.60 cm                          fraction by LVOT diam:     1.80 cm  2D MOD LV SV:         40                               biplane is LV SV Index:   20                               30.0 %. LVOT Area:     2.54 cm  LV Volumes (MOD) LV vol d, MOD    413.0 ml A2C: LV vol d, MOD    288.0 ml A4C: LV vol s, MOD    284.0 ml A2C: LV vol s, MOD    212.0 ml A4C: LV SV MOD A2C:   129.0 ml LV SV MOD A4C:   288.0 ml LV SV MOD BP:    104.8 ml RIGHT VENTRICLE            IVC RV S prime:     9.25 cm/s  IVC diam: 4.10 cm RVOT diam:      3.90 cm TAPSE (M-mode): 1.5 cm LEFT ATRIUM              Index        RIGHT ATRIUM           Index LA diam:        5.30 cm  2.64 cm/m   RA Area:     37.90 cm LA Vol (A2C):   150.0 ml 74.68 ml/m  RA Volume:   160.00 ml 79.66 ml/m LA Vol (A4C):   86.0 ml  42.82 ml/m LA Biplane Vol: 116.0 ml 57.75 ml/m  AORTIC VALVE                    PULMONIC VALVE AV Area (Vmax):    1.90 cm     PV Vmax:       0.82 m/s AV Area (Vmean):   1.75 cm     PV Peak grad:  2.7 mmHg AV Area (VTI):     1.90 cm AV Vmax:           143.00 cm/s AV Vmean:          87.500 cm/s AV VTI:            0.212 m AV Peak Grad:      8.2 mmHg AV Mean Grad:      4.0 mmHg LVOT Vmax:         107.00 cm/s LVOT Vmean:        60.100 cm/s LVOT VTI:          0.158 m LVOT/AV VTI ratio: 0.75  AORTA Ao Root diam: 2.80 cm Ao Asc diam:  3.30 cm MITRAL VALVE             TRICUSPID VALVE MV Area VTI:  1.44 cm   TR Peak  grad:   14.0 mmHg MV Peak grad: 6.4 mmHg   TR Vmax:        187.00 cm/s MV Mean grad: 3.0 mmHg MV Vmax:      1.26 m/s   SHUNTS MV Vmean:     71.9 cm/s  Systemic VTI:  0.16 m                          Systemic Diam: 1.80 cm  Pulmonic Diam: 3.90 cm Vinie Maxcy MD Electronically signed by Vinie Maxcy MD Signature Date/Time: 01/13/2024/5:37:04 PM    Final    DG CHEST PORT 1 VIEW Result Date: 01/13/2024 EXAM: 1 VIEW XRAY OF THE CHEST 01/13/2024 12:41:23 AM COMPARISON: 12/28/2018 CLINICAL HISTORY: Tachypnea FINDINGS: LUNGS AND PLEURA: Low lung volumes accentuate interstitial markings. No focal pulmonary opacity. No pulmonary edema. No pneumothorax. Left chest wall ICD. HEART AND MEDIASTINUM: Cardiomegaly. BONES AND SOFT TISSUES: No acute osseous abnormality. IMPRESSION: 1. No acute findings. 2. Cardiomegaly. Electronically signed by: Norman Gatlin MD 01/13/2024 12:46 AM EDT RP Workstation: [id number]   CT ABDOMEN PELVIS WO CONTRAST Result Date: 01/12/2024 CLINICAL DATA:  Abdominal pain EXAM: CT ABDOMEN AND PELVIS WITHOUT CONTRAST TECHNIQUE: Multidetector CT imaging of the abdomen and pelvis was performed following the standard protocol without IV contrast. RADIATION DOSE REDUCTION: This exam was performed according to the departmental dose-optimization program which includes automated exposure control, adjustment of the mA and/or kV according to patient size and/or use of iterative reconstruction technique. COMPARISON:  06/16/2023 FINDINGS: Lower chest: Pacer wires in the right heart, unchanged. Cardiomegaly. No acute findings. Hepatobiliary: Diffuse low-density throughout the liver compatible with fatty infiltration. No focal abnormality. Questionable subtle nodularity to the liver surface suggesting early cirrhosis. Gallbladder unremarkable. Pancreas: No focal abnormality or ductal dilatation. Spleen: No focal abnormality.  Normal size. Adrenals/Urinary Tract: No suspicious renal or  adrenal abnormality. No stones or hydronephrosis. Urinary bladder unremarkable. Stomach/Bowel: Normal appendix. Stomach, large and small bowel grossly unremarkable. Vascular/Lymphatic: No evidence of aneurysm or adenopathy. Aortic atherosclerosis. Reproductive: No visible focal abnormality. Other: Small amount of free fluid in the abdomen and pelvis, new since prior study. Musculoskeletal: No acute bony abnormality. IMPRESSION: Cardiomegaly. Hepatic steatosis. Questionable subtle nodularity to the liver surface suggesting early cirrhosis. Recommend clinical correlation. Small amount of free fluid in the abdomen and pelvis, new since prior study. Aortic atherosclerosis. Electronically Signed   By: Franky Crease M.D.   On: 01/12/2024 01:49   DG Chest 2 View Result Date: 01/11/2024 CLINICAL DATA:  Chest pain. EXAM: CHEST - 2 VIEW COMPARISON:  June 05, 2022 FINDINGS: There is stable multi lead AICD positioning. The cardiac silhouette is enlarged and unchanged in size. Both lungs are clear. The visualized skeletal structures are unremarkable. IMPRESSION: Stable cardiomegaly without active cardiopulmonary disease. Electronically Signed   By: Suzen Dials M.D.   On: 01/11/2024 19:04    Microbiology: Results for orders placed or performed in visit on 08/20/21  Urine Culture     Status: None   Collection Time: 08/20/21  2:23 PM   Specimen: Blood   UR  Result Value Ref Range Status   Urine Culture, Routine Final report  Final   Organism ID, Bacteria No growth  Final    Labs: CBC: Recent Labs  Lab 01/16/24 0512 01/18/24 0940 01/19/24 0903 01/19/24 1015 01/20/24 0815 01/21/24 0500  WBC 3.7* 6.0 6.7  --  6.5 4.5  HGB 7.6* 10.6* 11.0* 13.6  13.6 10.7* 10.5*  HCT 25.9* 35.3* 36.0* 40.0  40.0 35.9* 35.5*  MCV 68.2* 66.9* 66.1*  --  66.7* 67.1*  PLT 133* 233 271  --  303 312   Basic Metabolic Panel: Recent Labs  Lab 01/16/24 0512 01/17/24 0500 01/18/24 0455 01/19/24 0351 01/19/24 1015  01/20/24 0500 01/21/24 0500  NA 131* 133* 129* 126* 131*  131* 127* 131*  K 3.6 3.3* 3.8 3.6 3.5  3.4* 4.0 2.8*  CL 102 97* 87* 91*  --  87*  92*  CO2 18* 23 26 24   --  23 26  GLUCOSE 96 116* 115* 86  --  93 71  BUN 21* 21* 22* 25*  --  27* 19  CREATININE 1.84* 1.69* 1.76* 1.85*  --  1.70* 1.15  CALCIUM  9.1 9.9 10.1 9.0  --  9.6 9.2  MG 1.8 1.8 2.2 2.1  --  2.0  --    Liver Function Tests: Recent Labs  Lab 01/15/24 1425  AST 32  ALT 34  ALKPHOS 110  BILITOT 2.6*  PROT 5.7*  ALBUMIN 2.7*   CBG: No results for input(s): GLUCAP in the last 168 hours.  Discharge time spent: greater than 30 minutes.  Signed: Elidia Toribio Furnace, MD [facility] 01/21/2024

## 2024-01-21 NOTE — Plan of Care (Signed)
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
## 2024-01-21 NOTE — Op Note (Signed)
 [facility] Patient Name: Cody Guzman Procedure Date : 01/21/2024 MRN: [id number] Attending MD: Glendia BRAVO. Stacia , MD, [id number] Date of Birth: December 17, 1965 CSN: [id number] Age: 58 Admit Type: Inpatient Procedure:                Colonoscopy Indications:              Iron  deficiency anemia Providers:                Glendia E. Stacia, MD, Willy Hummer, RN,                            Coye Bade, Technician Referring MD:              Medicines:                Monitored Anesthesia Care Complications:            No immediate complications. Estimated Blood Loss:     Estimated blood loss was minimal. Procedure:                Pre-Anesthesia Assessment:                           - Prior to the procedure, a History and Physical                            was performed, and patient medications and                            allergies were reviewed. The patient's tolerance of                            previous anesthesia was also reviewed. The risks                            and benefits of the procedure and the sedation                            options and risks were discussed with the patient.                            All questions were answered, and informed consent                            was obtained. Prior Anticoagulants: The patient has                            taken Eliquis  (apixaban ), last dose was 8 days                            prior to procedure. ASA Grade Assessment: IV - A                            patient with severe systemic disease that is a  constant threat to life. After reviewing the risks                            and benefits, the patient was deemed in                            satisfactory condition to undergo the procedure.                           After obtaining informed consent, the colonoscope                            was passed under direct vision. Throughout the                             procedure, the patient's blood pressure, pulse, and                            oxygen saturations were monitored continuously. The                            CF-HQ190L ([id number]) Olympus colonoscopy was                            introduced through the anus and advanced to the the                            cecum, identified by the ileocecal valve. The                            colonoscopy was somewhat difficult due to poor                            bowel prep and a tortuous colon. Successful                            completion of the procedure was aided by using                            manual pressure. The bowel preparation used was                            SUPREP via split dose instruction. Scope In: 8:17:59 AM Scope Out: 8:49:46 AM Total Procedure Duration: 0 hours 31 minutes 47 seconds  Findings:      The perianal and digital rectal examinations were normal. Pertinent       negatives include normal sphincter tone and no palpable rectal lesions.      Five sessile polyps were found in the ascending colon. The polyps were 2       to 10 mm in size. These polyps were removed with a cold snare. Resection       and retrieval were complete. Estimated blood loss was minimal.      Two sessile polyps were found in the transverse colon. The polyps were 3  to 5 mm in size. These polyps were removed with a cold snare. Resection       and retrieval were complete. Estimated blood loss was minimal.      A 12 mm polyp was found in the sigmoid colon. The polyp was sessile. The       polyp was removed with a cold snare. Resection and retrieval were       complete. To prevent bleeding after the polypectomy, three hemostatic       clips were successfully placed (MR conditional). Clip manufacturer:       [employer]. There was no bleeding at the end of the procedure.      Non-bleeding internal hemorrhoids were found during retroflexion. The       hemorrhoids were Grade I (internal  hemorrhoids that do not prolapse).      No additional abnormalities were found on retroflexion. Impression:               - Five 2 to 10 mm polyps in the ascending colon,                            removed with a cold snare. Resected and retrieved.                            Given the large amount of stool debris in the polyp                            trap, it was difficult to discern polyp tissue from                            stool debris.                           - Two 3 to 5 mm polyps in the transverse colon,                            removed with a cold snare. Resected and retrieved.                           - One 12 mm polyp in the sigmoid colon, removed                            with a cold snare. Resected and retrieved. Clips                            (MR conditional) were placed. Clip manufacturer:                            [employer].                           - Non-bleeding internal hemorrhoids.                           - Poor bowel prep. Most of the cecum and segments  of the splenic flexure and descending colon were                            not able to be visualized due to large amount of                            liquid stool with solid debris that repeatedly                            occluded the suction channel Moderate Sedation:      N/A Recommendation:           - Return patient to hospital ward for possible                            discharge same day.                           - Resume previous diet.                           - Continue present medications.                           - Resume Eliquis  (apixaban ) at prior dose in 3 days.                           - Await pathology results.                           - Repeat colonoscopy in 1 year because the bowel                            preparation was poor.                           - Will arrange outpatient follow up.                           - GI will sign off at  this time Procedure Code(s):        --- Professional ---                           [phone], Colonoscopy, flexible; with removal of                            tumor(s), polyp(s), or other lesion(s) by snare                            technique Diagnosis Code(s):        --- Professional ---                           D12.2, Benign neoplasm of ascending colon  D12.3, Benign neoplasm of transverse colon (hepatic                            flexure or splenic flexure)                           D12.5, Benign neoplasm of sigmoid colon                           K64.0, First degree hemorrhoids                           D50.9, Iron  deficiency anemia, unspecified CPT copyright 2022 American Medical Association. All rights reserved. The codes documented in this report are preliminary and upon coder review may  be revised to meet current compliance requirements. [employer] E. Stacia, MD 01/21/2024 9:47:28 AM This report has been signed electronically. Number of Addenda: 0

## 2024-01-21 NOTE — Interval H&P Note (Signed)
 History and Physical Interval Note:  01/21/2024 7:28 AM  Cody Guzman  has presented today for surgery, with the diagnosis of iron  deficiency anemia, black stool.  The various methods of treatment have been discussed with the patient and family. After consideration of risks, benefits and other options for treatment, the patient has consented to  Procedure(s): COLONOSCOPY (N/A) EGD (ESOPHAGOGASTRODUODENOSCOPY) (N/A) as a surgical intervention.  The patient's history has been reviewed, patient examined, no change in status, stable for surgery.  I have reviewed the patient's chart and labs.  Questions were answered to the patient's satisfaction.     Glendia FORBES Holt

## 2024-01-21 NOTE — Transfer of Care (Signed)
 Immediate Anesthesia Transfer of Care Note  Patient: Cody Guzman  Procedure(s) Performed: COLONOSCOPY EGD (ESOPHAGOGASTRODUODENOSCOPY)  Patient Location: PACU  Anesthesia Type:MAC  Level of Consciousness: sedated  Airway & Oxygen Therapy: Patient Spontanous Breathing  Post-op Assessment: Report given to RN  Post vital signs: Reviewed and stable  Last Vitals:  Vitals Value Taken Time  BP 99/63 01/21/24 09:00  Temp    Pulse 74 01/21/24 09:03  Resp 11 01/21/24 09:03  SpO2 100 % 01/21/24 09:03  Vitals shown include unfiled device data.  Last Pain:  Vitals:   01/21/24 0724  TempSrc: Temporal  PainSc: 0-No pain      Patients Stated Pain Goal: 0 (01/20/24 9191)  Complications: No notable events documented.

## 2024-01-21 NOTE — Op Note (Signed)
 [facility] Patient Name: Cody Guzman Procedure Date : 01/21/2024 MRN: [id number] Attending MD: Glendia BRAVO. Stacia , MD, [id number] Date of Birth: 1965-09-03 CSN: [id number] Age: 58 Admit Type: Inpatient Procedure:                Upper GI endoscopy Indications:              Iron  deficiency anemia, Melena Providers:                Glendia E. Stacia, MD, Willy Hummer, RN,                            Coye Bade, Technician Referring MD:              Medicines:                Monitored Anesthesia Care Complications:            No immediate complications. Estimated Blood Loss:     Estimated blood loss was minimal. Procedure:                Pre-Anesthesia Assessment:                           - Prior to the procedure, a History and Physical                            was performed, and patient medications and                            allergies were reviewed. The patient's tolerance of                            previous anesthesia was also reviewed. The risks                            and benefits of the procedure and the sedation                            options and risks were discussed with the patient.                            All questions were answered, and informed consent                            was obtained. Prior Anticoagulants: The patient has                            taken Eliquis  (apixaban ), last dose was 8 days                            prior to procedure. ASA Grade Assessment: IV - A                            patient with severe systemic disease that is a  constant threat to life. After reviewing the risks                            and benefits, the patient was deemed in                            satisfactory condition to undergo the procedure.                           After obtaining informed consent, the endoscope was                            passed under direct vision. Throughout the                             procedure, the patient's blood pressure, pulse, and                            oxygen saturations were monitored continuously. The                            [id number]-[id number] ([id number]) Olympus endoscope was introduced                            through the mouth, and advanced to the second part                            of duodenum. The upper GI endoscopy was                            accomplished without difficulty. The patient                            tolerated the procedure well. Scope In: Scope Out: Findings:      The examined portions of the nasopharynx, oropharynx and larynx were       normal.      Grade I varices were found in the lower third of the esophagus.      The exam of the esophagus was otherwise normal.      Three 5 to 8 mm friable semi-pedunculated polyps were found in the       gastric body and in the gastric antrum. There was a scant amount of       fresh blood in the gastric lumen upon entering the stomach. These polyps       were removed with a hot snare. Resection and retrieval were complete.       Estimated blood loss: none.      Diffuse mildly erythematous mucosa without bleeding was found in the       gastric body. Biopsies were taken with a cold forceps for Helicobacter       pylori testing. Estimated blood loss was minimal.      A small hiatal hernia was present.      Multiple 3 to 8 mm sessile polyps were found in the duodenal bulb. Two       of these in the very proximal  bulb had apertures and the appearance of       papillas. Biopsies were taken with a cold forceps for histology.       Estimated blood loss was minimal.      The exam of the duodenum was otherwise normal. Impression:               - The examined portions of the nasopharynx,                            oropharynx and larynx were normal.                           - Grade I esophageal varices. With nodular                            appearing liver on CT, patient likely has cirrhosis.                            - Three gastric polyps. Resected and retrieved. Two                            of these polyps were friable and are likely sources                            of GI bleeding.                           - Erythematous mucosa in the gastric body. Biopsied.                           - Small hiatal hernia.                           - Multiple duodenal polyps. Biopsied. Moderate Sedation:      N/A Recommendation:           - Return patient to hospital ward for possible                            discharge same day.                           - Resume previous diet.                           - Continue present medications.                           - Resume Eliquis  (apixaban ) at prior dose in 3 days                            (Sunday).                           - Await pathology results.                           -  Recommend twice daily PPI for 4 weeks to allow                            polypectomy sites to heal.                           - Will need further evaluation for suspect                            cirrhosis as an outpatient.                           - Will arrange outpatient GI follow up Procedure Code(s):        --- Professional ---                           [phone], Esophagogastroduodenoscopy, flexible,                            transoral; with removal of tumor(s), polyp(s), or                            other lesion(s) by snare technique                           43239, 59, Esophagogastroduodenoscopy, flexible,                            transoral; with biopsy, single or multiple Diagnosis Code(s):        --- Professional ---                           I85.00, Esophageal varices without bleeding                           K31.7, Polyp of stomach and duodenum                           K31.89, Other diseases of stomach and duodenum                           K44.9, Diaphragmatic hernia without obstruction or                            gangrene                            D50.9, Iron  deficiency anemia, unspecified                           K92.1, Melena (includes Hematochezia) CPT copyright 2022 American Medical Association. All rights reserved. The codes documented in this report are preliminary and upon coder review may  be revised to meet current compliance requirements. Jaquaveon Bilal E. Stacia, MD 01/21/2024 9:31:04 AM This report has been signed electronically. Number of Addenda: 0

## 2024-01-21 NOTE — Progress Notes (Signed)
 Went over discharge paperwork with patient. All questions answered. Medications delivered at bedside. Telemetry removed, iv team at bedside to remove PICC line.

## 2024-01-22 ENCOUNTER — Telehealth: Payer: Self-pay | Admitting: *Deleted

## 2024-01-22 ENCOUNTER — Encounter (HOSPITAL_COMMUNITY)
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
## 2024-01-22 NOTE — Transitions of Care (Post Inpatient/ED Visit) (Signed)
   01/22/2024  Name: Cody Guzman MRN: [id number] DOB: 1966-02-16  Today's TOC FU Call Status: Today's TOC FU Call Status:: Unsuccessful Call (1st Attempt) Unsuccessful Call (1st Attempt) Date: 01/22/24  Attempted to reach the patient regarding the most recent Inpatient/ED visit.  Follow Up Plan: Additional outreach attempts will be made to reach the patient to complete the Transitions of Care (Post Inpatient/ED visit) call.   Andrea Dimes RN, BSN [place]  [facility] RN Care Manager [phone]

## 2024-01-25 ENCOUNTER — Telehealth (HOSPITAL_COMMUNITY): Payer: Self-pay

## 2024-01-25 ENCOUNTER — Encounter (HOSPITAL_COMMUNITY): Payer: Self-pay | Admitting: Gastroenterology

## 2024-01-25 ENCOUNTER — Telehealth: Payer: Self-pay | Admitting: *Deleted

## 2024-01-25 LAB — SURGICAL PATHOLOGY

## 2024-01-25 NOTE — Telephone Encounter (Signed)
 Called to confirm/remind patient of their appointment at the [facility] on 01/26/24.   Appointment:   [] Confirmed  [] Left mess   [] No answer/No voice mail  [] VM Full/unable to leave message  [x] Phone not in service

## 2024-01-25 NOTE — Transitions of Care (Post Inpatient/ED Visit) (Signed)
   01/25/2024  Name: Keegen Akerele-Ale MRN: [id number] DOB: 1965/10/07  Today's TOC FU Call Status: Today's TOC FU Call Status:: Unsuccessful Call (2nd Attempt) Unsuccessful Call (2nd Attempt) Date: 01/25/24  Attempted to reach the patient regarding the most recent Inpatient/ED visit.  Follow Up Plan: Additional outreach attempts will be made to reach the patient to complete the Transitions of Care (Post Inpatient/ED visit) call.   Andrea Dimes RN, BSN [place]  [facility] Health RN Care Manager [phone]

## 2024-01-26 ENCOUNTER — Encounter (HOSPITAL_COMMUNITY)

## 2024-01-26 ENCOUNTER — Telehealth: Payer: Self-pay | Admitting: *Deleted

## 2024-01-26 NOTE — Transitions of Care (Post Inpatient/ED Visit) (Signed)
   01/26/2024  Name: Meril Akerele-Ale MRN: [id number] DOB: 1966/02/13  Today's TOC FU Call Status: Today's TOC FU Call Status:: Unsuccessful Call (3rd Attempt) Unsuccessful Call (3rd Attempt) Date: 01/26/24  Attempted to reach the patient regarding the most recent Inpatient/ED visit.  Follow Up Plan: No further outreach attempts will be made at this time. We have been unable to contact the patient.  Andrea Dimes RN, BSN [place]  [facility] Health RN Care Manager [phone]

## 2024-01-28 NOTE — Progress Notes (Signed)
Remote ICD Transmission.

## 2024-01-29 ENCOUNTER — Ambulatory Visit: Payer: Self-pay | Admitting: Gastroenterology

## 2024-01-29 NOTE — Progress Notes (Signed)
 [name],  The polyps removed from your stomach were not precancerous.  They were related to inflammation, and were a possible source of GI bleeding.  No further follow-up of these polyps is needed.  The biopsies taken from your stomach were notable for chronic gastritis (inflammation) which is a common finding, but there was no evidence of Helicobacter pylori infection. This common finding is not likely to explain anemia and there is no specific treatment or further evaluation recommended.  The polyp in your duodenum was also not precancerous.  No follow-up is needed.   Several polyps that I removed during your colonoscopy were completely benign but were proven to be pre-cancerous polyps that MAY have grown into cancers if they had not been removed.  Studies shows that at least 20% of women over age 63 and 30% of men over age 28 have pre-cancerous polyps.  The larger polyp in the sigmoid colon was not precancerous.     Because of the poor bowel prep quality and possibility of missing polyps, I recommend you have a repeat colonoscopy within 1 year.  Please follow-up in our office to coordinate scheduling of this colonoscopy and also to further evaluate possible cirrhosis (based on your liver appearance and the presence of small esophageal varices on your EGD).
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
## 2024-03-09 ENCOUNTER — Emergency Department (HOSPITAL_COMMUNITY)

## 2024-03-09 ENCOUNTER — Ambulatory Visit: Payer: Self-pay | Admitting: Family Medicine

## 2024-03-09 ENCOUNTER — Telehealth: Payer: Self-pay | Admitting: Internal Medicine

## 2024-03-09 ENCOUNTER — Other Ambulatory Visit: Payer: Self-pay | Admitting: Nurse Practitioner

## 2024-03-09 ENCOUNTER — Other Ambulatory Visit (HOSPITAL_COMMUNITY): Payer: Self-pay

## 2024-03-09 ENCOUNTER — Emergency Department (HOSPITAL_COMMUNITY): Admission: EM | Admit: 2024-03-09 | Discharge: 2024-03-10 | Disposition: A | Discharge status: Home / Self Care

## 2024-03-09 ENCOUNTER — Other Ambulatory Visit: Payer: Self-pay

## 2024-03-09 ENCOUNTER — Ambulatory Visit: Admitting: Physician Assistant

## 2024-03-09 ENCOUNTER — Encounter (HOSPITAL_COMMUNITY): Payer: Self-pay | Admitting: *Deleted

## 2024-03-09 DIAGNOSIS — I5023 Acute on chronic systolic (congestive) heart failure: Secondary | ICD-10-CM | POA: Diagnosis not present

## 2024-03-09 DIAGNOSIS — E876 Hypokalemia: Secondary | ICD-10-CM | POA: Insufficient documentation

## 2024-03-09 DIAGNOSIS — R778 Other specified abnormalities of plasma proteins: Secondary | ICD-10-CM | POA: Insufficient documentation

## 2024-03-09 DIAGNOSIS — I517 Cardiomegaly: Secondary | ICD-10-CM | POA: Diagnosis not present

## 2024-03-09 DIAGNOSIS — R0602 Shortness of breath: Secondary | ICD-10-CM | POA: Diagnosis not present

## 2024-03-09 DIAGNOSIS — R0989 Other specified symptoms and signs involving the circulatory and respiratory systems: Secondary | ICD-10-CM | POA: Diagnosis not present

## 2024-03-09 DIAGNOSIS — E785 Hyperlipidemia, unspecified: Secondary | ICD-10-CM

## 2024-03-09 DIAGNOSIS — N189 Chronic kidney disease, unspecified: Secondary | ICD-10-CM | POA: Insufficient documentation

## 2024-03-09 DIAGNOSIS — R1084 Generalized abdominal pain: Secondary | ICD-10-CM | POA: Diagnosis not present

## 2024-03-09 DIAGNOSIS — G8929 Other chronic pain: Secondary | ICD-10-CM

## 2024-03-09 DIAGNOSIS — Z79899 Other long term (current) drug therapy: Secondary | ICD-10-CM | POA: Insufficient documentation

## 2024-03-09 DIAGNOSIS — R600 Localized edema: Secondary | ICD-10-CM | POA: Insufficient documentation

## 2024-03-09 DIAGNOSIS — N401 Enlarged prostate with lower urinary tract symptoms: Secondary | ICD-10-CM

## 2024-03-09 DIAGNOSIS — M549 Dorsalgia, unspecified: Secondary | ICD-10-CM | POA: Insufficient documentation

## 2024-03-09 DIAGNOSIS — I5022 Chronic systolic (congestive) heart failure: Secondary | ICD-10-CM

## 2024-03-09 DIAGNOSIS — J4520 Mild intermittent asthma, uncomplicated: Secondary | ICD-10-CM

## 2024-03-09 DIAGNOSIS — M1A09X0 Idiopathic chronic gout, multiple sites, without tophus (tophi): Secondary | ICD-10-CM

## 2024-03-09 LAB — CBC
HCT: 32.1 % — ABNORMAL LOW (ref 39.0–52.0)
Hemoglobin: 9.6 g/dL — ABNORMAL LOW (ref 13.0–17.0)
MCH: 21.9 pg — ABNORMAL LOW (ref 26.0–34.0)
MCHC: 29.9 g/dL — ABNORMAL LOW (ref 30.0–36.0)
MCV: 73.3 fL — ABNORMAL LOW (ref 80.0–100.0)
Platelets: 235 K/uL (ref 150–400)
RBC: 4.38 MIL/uL (ref 4.22–5.81)
RDW: 27.9 % — ABNORMAL HIGH (ref 11.5–15.5)
WBC: 4.1 K/uL (ref 4.0–10.5)
nRBC: 0 % (ref 0.0–0.2)

## 2024-03-09 LAB — DIFFERENTIAL
Abs Immature Granulocytes: 0.01 K/uL (ref 0.00–0.07)
Basophils Absolute: 0.1 K/uL (ref 0.0–0.1)
Basophils Relative: 2 %
Eosinophils Absolute: 0.1 K/uL (ref 0.0–0.5)
Eosinophils Relative: 3 %
Immature Granulocytes: 0 %
Lymphocytes Relative: 27 %
Lymphs Abs: 1.1 K/uL (ref 0.7–4.0)
Monocytes Absolute: 0.6 K/uL (ref 0.1–1.0)
Monocytes Relative: 15 %
Neutro Abs: 2.2 K/uL (ref 1.7–7.7)
Neutrophils Relative %: 53 %

## 2024-03-09 LAB — BASIC METABOLIC PANEL WITH GFR
Anion gap: 11 (ref 5–15)
BUN: 13 mg/dL (ref 6–20)
CO2: 18 mmol/L — ABNORMAL LOW (ref 22–32)
Calcium: 9.2 mg/dL (ref 8.9–10.3)
Chloride: 108 mmol/L (ref 98–111)
Creatinine, Ser: 1.5 mg/dL — ABNORMAL HIGH (ref 0.61–1.24)
GFR, Estimated: 53 mL/min — ABNORMAL LOW (ref >60)
Glucose, Bld: 111 mg/dL — ABNORMAL HIGH (ref 70–99)
Potassium: 3.3 mmol/L — ABNORMAL LOW (ref 3.5–5.1)
Sodium: 137 mmol/L (ref 135–145)

## 2024-03-09 LAB — TROPONIN I (HIGH SENSITIVITY): Troponin I (High Sensitivity): 46 ng/L — ABNORMAL HIGH (ref <18)

## 2024-03-09 LAB — BRAIN NATRIURETIC PEPTIDE: B Natriuretic Peptide: 564.3 pg/mL — ABNORMAL HIGH (ref 0.0–100.0)

## 2024-03-09 MED ORDER — MIDODRINE HCL 2.5 MG PO TABS
2.5000 mg | ORAL_TABLET | Freq: Three times a day (TID) | ORAL | 0 refills | Status: DC
  Filled 2024-03-09 – 2024-03-10 (×3): qty 90, 30d supply, fill #0

## 2024-03-09 MED ORDER — ALBUTEROL SULFATE HFA 108 (90 BASE) MCG/ACT IN AERS
1.0000 | INHALATION_SPRAY | Freq: Four times a day (QID) | RESPIRATORY_TRACT | 0 refills | Status: DC | PRN
  Filled 2024-03-09 – 2024-03-10 (×3): qty 6.7, 25d supply, fill #0

## 2024-03-09 MED ORDER — EMPAGLIFLOZIN 10 MG PO TABS
10.0000 mg | ORAL_TABLET | Freq: Every day | ORAL | 0 refills | Status: DC
  Filled 2024-03-09 – 2024-03-10 (×3): qty 30, 30d supply, fill #0

## 2024-03-09 MED ORDER — TOPIRAMATE 25 MG PO TABS
25.0000 mg | ORAL_TABLET | Freq: Every day | ORAL | 0 refills | Status: DC
  Filled 2024-03-09 – 2024-03-10 (×3): qty 30, 30d supply, fill #0

## 2024-03-09 MED ORDER — TAMSULOSIN HCL 0.4 MG PO CAPS
0.4000 mg | ORAL_CAPSULE | Freq: Every day | ORAL | 0 refills | Status: DC
  Filled 2024-03-09 – 2024-03-10 (×3): qty 30, 30d supply, fill #0

## 2024-03-09 MED ORDER — COLCHICINE 0.6 MG PO TABS
0.6000 mg | ORAL_TABLET | ORAL | 0 refills | Status: DC
  Filled 2024-03-09 – 2024-03-10 (×2): qty 15, 30d supply, fill #0

## 2024-03-09 MED ORDER — ATORVASTATIN CALCIUM 80 MG PO TABS
80.0000 mg | ORAL_TABLET | Freq: Every day | ORAL | 0 refills | Status: DC
  Filled 2024-03-09 – 2024-03-10 (×3): qty 30, 30d supply, fill #0

## 2024-03-09 MED ORDER — ALLOPURINOL 100 MG PO TABS
100.0000 mg | ORAL_TABLET | Freq: Every day | ORAL | 0 refills | Status: DC
  Filled 2024-03-09 – 2024-03-10 (×3): qty 30, 30d supply, fill #0

## 2024-03-09 MED ORDER — DIGOXIN 125 MCG PO TABS
0.1250 mg | ORAL_TABLET | Freq: Every day | ORAL | 0 refills | Status: DC
  Filled 2024-03-09 – 2024-03-10 (×3): qty 30, 30d supply, fill #0

## 2024-03-09 MED ORDER — QUETIAPINE FUMARATE 100 MG PO TABS
100.0000 mg | ORAL_TABLET | Freq: Two times a day (BID) | ORAL | 0 refills | Status: DC
  Filled 2024-03-09 – 2024-03-10 (×3): qty 60, 30d supply, fill #0

## 2024-03-09 MED ORDER — PANTOPRAZOLE SODIUM 40 MG PO TBEC
40.0000 mg | DELAYED_RELEASE_TABLET | Freq: Two times a day (BID) | ORAL | 0 refills | Status: AC
  Filled 2024-03-09 – 2024-03-10 (×3): qty 60, 30d supply, fill #0

## 2024-03-09 MED ORDER — AMIODARONE HCL 200 MG PO TABS
200.0000 mg | ORAL_TABLET | Freq: Every day | ORAL | 0 refills | Status: DC
  Filled 2024-03-09 – 2024-03-10 (×3): qty 30, 30d supply, fill #0

## 2024-03-09 NOTE — ED Triage Notes (Signed)
 The pt is c/o being sob for 3 days hx of chf  no 02 at home  he reports that he has had extreme swelling  in his feet and legs for 3 days also

## 2024-03-09 NOTE — Telephone Encounter (Signed)
 Pt c/o swelling/edema: STAT if pt has developed SOB within 24 hours  If swelling, where is the swelling located? Legs and feet   How much weight have you gained and in what time span? N/a   Have you gained 2 pounds in a day or 5 pounds in a week? N/a   Do you have a log of your daily weights (if so, list)? No   Are you currently taking a fluid pill? Yes   Are you currently SOB? Yes   Have you traveled recently in a car or plane for an extended period of time?    Pt c/o medication issue:  1. Name of Medication: torsemide  (DEMADEX ) 20 MG tablet [[id number]]  DISCONTINUED   2. How are you currently taking this medication (dosage and times per day)? None   3. Are you having a reaction (difficulty breathing--STAT)? No   4. What is your medication issue? Pt calling for refill of medication for swelling but rx is discontinued. Please advise.    Call transferring

## 2024-03-09 NOTE — ED Provider Triage Note (Signed)
 Emergency Medicine Provider Triage Evaluation Note  Cody Guzman , a 58 y.o. male  was evaluated in triage.  Pt complains of shortness of breath x 3 days accompanied with bilateral leg swelling and cough. Was supposed to be taking torsemide  but ran out 4 days ago and has not been able to get medications refilled. Hx of heart failure.   Endorses headache (similar to previous migraines, without light or sound sensitivity. Intermittent bilateral blurry vision.) dysuria this morning.   Denies fever, vertigo, chest pain, abdominal pain, n/v/d.   Review of Systems  Positive: N/a Negative: N/a  Physical Exam  BP 118/82 (BP Location: Right Arm)   Pulse (!) 56   Temp (!) 97.5 F (36.4 C) (Oral)   Resp 20   Ht 5' 8 (1.727 m)   Wt 75.8 kg   SpO2 99%   BMI 25.41 kg/m  Gen:   Awake, no distress   Resp:  Normal effort  MSK:   Moves extremities without difficulty  Other:    Medical Decision Making  Medically screening exam initiated at 9:40 PM.  Appropriate orders placed.  Cody Guzman was informed that the remainder of the evaluation will be completed by another provider, this initial triage assessment does not replace that evaluation, and the importance of remaining in the ED until their evaluation is complete.     [name], [place] 03/09/24 2144

## 2024-03-09 NOTE — Telephone Encounter (Signed)
 Spoke with the patient who states that he has been out of torsemide  for 3 days. He states increased swelling since then. He reports being short of breath and feeling miserable. He reports that he knows he has gained weight but is unsure how much. Denies any shortness of breath. He is on his way to urgent care right now.

## 2024-03-09 NOTE — Telephone Encounter (Signed)
 FYI Only or Action Required?: Action required by provider: medication refill request.  Patient was last seen in primary care on 08/17/2023 by Theotis Haze ORN, NP.  Called Nurse Triage reporting Leg Swelling.  Symptoms began several days ago.  Interventions attempted: Nothing.  Symptoms are: gradually worsening.  Triage Disposition: See HCP Within 4 Hours (Or PCP Triage)  Patient/caregiver understands and will follow disposition?: Yes    Copied from CRM [id number]. Topic: Clinical - Red Word Triage >> Mar 09, 2024  4:27 PM Sophia H wrote: Red Word that prompted transfer to Nurse Triage: Patient is reporting pain and swelling in legs,feet & ankles. Has been without medication torsemide  for 3 days now and wanting refill. Reason for Disposition  SEVERE leg swelling (e.g., swelling extends above knee, entire leg is swollen, weeping fluid)  Answer Assessment - Initial Assessment Questions Pt states that he has been out of Torsemide  out for 4 days. Do not see this on his medication list. Upon chart review it was last ordered in April of 2025 He states he has had a 1-2 pound weight gain in the last 4 days. He states the swelling today became painful that's why he called. When asked if he's having worsening shortness of breath he states yes then asked to explain it and he states same as it's been.  Denies any redness just swelling in both legs. He also mentioned he has what looks like bug bites on his torso but unsure where they came from.  RN called [facility] who advised to tell pt to go to mobile clinic, closes today at 530. RN called pt back and advised pt to ER or UC to be evaluated. Rn will also send this message to see if the Torsemide  can be sent in as it looks like it has been discontinued. RN also advised patient to call cardiology to see if they can assist.    1. ONSET: When did the swelling start? (e.g., minutes, hours, days)     2 days ago 2. LOCATION: What part of the leg is swollen?   Are both legs swollen or just one leg?     Both legs 3. SEVERITY: How bad is the swelling? (e.g., localized; mild, moderate, severe)     Bad 4. REDNESS: Is there redness or signs of infection?     no 5. PAIN: Is the swelling painful to touch? If Yes, ask: How painful is it?   (Scale 1-10; mild, moderate or severe)     7 6. FEVER: Do you have a fever? If Yes, ask: What is it, how was it measured, and when did it start?      no 7. CAUSE: What do you think is causing the leg swelling?     Out of torsemide  8. MEDICAL HISTORY: Do you have a history of blood clots (e.g., DVT), cancer, heart failure, kidney disease, or liver failure?     Rbbb, chf, cardiomyopathy 9. RECURRENT SYMPTOM: Have you had leg swelling before? If Yes, ask: When was the last time? What happened that time?     yes 10. OTHER SYMPTOMS: Do you have any other symptoms? (e.g., chest pain, difficulty breathing)       Shortness of breath that's the same as it has been  Protocols used: Leg Swelling and Edema-A-AH

## 2024-03-10 ENCOUNTER — Other Ambulatory Visit: Payer: Self-pay

## 2024-03-10 ENCOUNTER — Other Ambulatory Visit (HOSPITAL_COMMUNITY): Payer: Self-pay

## 2024-03-10 DIAGNOSIS — I5023 Acute on chronic systolic (congestive) heart failure: Secondary | ICD-10-CM | POA: Diagnosis not present

## 2024-03-10 LAB — URINALYSIS, ROUTINE W REFLEX MICROSCOPIC
Bacteria, UA: NONE SEEN
Bilirubin Urine: NEGATIVE
Glucose, UA: >=500 mg/dL — AB
Ketones, ur: NEGATIVE mg/dL
Leukocytes,Ua: NEGATIVE
Nitrite: NEGATIVE
Protein, ur: 100 mg/dL — AB
RBC / HPF: >50 RBC/hpf (ref 0–5)
Specific Gravity, Urine: 1.024 (ref 1.005–1.030)
pH: 5 (ref 5.0–8.0)

## 2024-03-10 LAB — TROPONIN I (HIGH SENSITIVITY): Troponin I (High Sensitivity): 41 ng/L — ABNORMAL HIGH (ref <18)

## 2024-03-10 MED ORDER — DICYCLOMINE HCL 10 MG PO CAPS
20.0000 mg | ORAL_CAPSULE | Freq: Once | ORAL | Status: AC
  Administered 2024-03-10: 20 mg via ORAL
  Filled 2024-03-10: qty 2

## 2024-03-10 MED ORDER — HYDROCORTISONE 1 % EX CREA
TOPICAL_CREAM | Freq: Once | CUTANEOUS | Status: AC
  Filled 2024-03-10: qty 28

## 2024-03-10 MED ORDER — TORSEMIDE 20 MG PO TABS: 20.0000 mg | ORAL_TABLET | Freq: Every day | ORAL | 1 refills | Status: DC

## 2024-03-10 MED ORDER — POTASSIUM CHLORIDE CRYS ER 20 MEQ PO TBCR
40.0000 meq | EXTENDED_RELEASE_TABLET | Freq: Once | ORAL | Status: AC
  Administered 2024-03-10: 40 meq via ORAL
  Filled 2024-03-10: qty 2

## 2024-03-10 MED ORDER — POTASSIUM CHLORIDE CRYS ER 10 MEQ PO TBCR: 10.0000 meq | EXTENDED_RELEASE_TABLET | Freq: Every day | ORAL | 0 refills | Status: DC

## 2024-03-10 MED ORDER — FUROSEMIDE 10 MG/ML IJ SOLN
40.0000 mg | Freq: Once | INTRAMUSCULAR | Status: AC
  Administered 2024-03-10: 40 mg via INTRAVENOUS
  Filled 2024-03-10: qty 4

## 2024-03-10 NOTE — Telephone Encounter (Signed)
 Noted, patient is currently in ED

## 2024-03-10 NOTE — Discharge Instructions (Addendum)
 Your torsemide  and potassium prescriptions have been sent to your pharmacy.  You were given the first dose of both in the emergency department.  Follow-up with your primary care provider and your cardiologist.  Return to the ER for any new or worsening symptoms.

## 2024-03-10 NOTE — ED Notes (Signed)
 The xray staff reports that they took several roaches of the pt when they xrayed him

## 2024-03-10 NOTE — ED Notes (Signed)
 This RN ans Camie, RN attempted to insert an IV but was unsuccessful

## 2024-03-10 NOTE — ED Notes (Signed)
 CCMD Called

## 2024-03-10 NOTE — Progress Notes (Signed)
 Heart Failure Navigator Progress Note  Assessed for Heart & Vascular TOC clinic readiness.  Patient does not meet criteria due to he is an Advanced Heart Failure Team patient. No HF TOC .    Navigator will sign off at this time.   Stephane Haddock, BSN, Scientist, Clinical (histocompatibility And Immunogenetics) Only

## 2024-03-10 NOTE — ED Notes (Signed)
IV Team at [facility].

## 2024-03-10 NOTE — ED Provider Notes (Addendum)
 [place] EMERGENCY DEPARTMENT AT [facility] Provider Note   CSN: [id number] Arrival date & time: 03/09/24  2054     Patient presents with: Shortness of Breath   Cody Guzman is a 58 y.o. male.   HPI 58 year old male with a history of CHF, cardiomyopathy, CKD, chronic back pain, and other comorbidities presents with swelling.  He has been having chest pain, shortness of breath, and leg swelling for about 3 days or so.  He recently ran out of his torsemide  and tried to call for refill but was refilled on all of his meds but his torsemide .  He has been having chronic dysuria and some hematuria but this is unchanged.  He has been having abdominal pain for over a year and states that despite multiple evaluations no one has found a cause.  This pain is not new or worse or different but still bothering him.  He also has chronic back pain.  He is primarily here for diuresis.  Prior to Admission medications   Medication Sig Start Date End Date Taking? Authorizing Provider  potassium chloride  (KLOR-CON  M) 10 MEQ tablet Take 1 tablet (10 mEq total) by mouth daily. 03/10/24  Yes Freddi Hamilton, MD  torsemide  (DEMADEX ) 20 MG tablet Take 1 tablet (20 mg total) by mouth daily. 03/10/24  Yes Freddi Hamilton, MD  albuterol  (VENTOLIN  HFA) 108 (90 Base) MCG/ACT inhaler Inhale 1-2 puffs into the lungs every 6 (six) hours as needed for wheezing or shortness of breath. 03/09/24   Newlin, Enobong, MD  allopurinol  (ZYLOPRIM ) 100 MG tablet Take 1 tablet (100 mg total) by mouth daily. 03/09/24   Newlin, Enobong, MD  amiodarone  (PACERONE ) 200 MG tablet Take 1 tablet (200 mg total) by mouth daily. 03/09/24   Newlin, Enobong, MD  atorvastatin  (LIPITOR ) 80 MG tablet Take 1 tablet (80 mg total) by mouth daily. 03/09/24   Newlin, Enobong, MD  colchicine  0.6 MG tablet Take 1 tablet (0.6 mg total) by mouth every other day. 03/09/24   Newlin, Enobong, MD  digoxin  (LANOXIN ) 0.125 MG tablet Take 1 tablet (0.125 mg  total) by mouth daily. 03/09/24   Newlin, Enobong, MD  empagliflozin  (JARDIANCE ) 10 MG TABS tablet Take 1 tablet (10 mg total) by mouth daily before breakfast. 03/09/24   Delbert Clam, MD  midodrine  (PROAMATINE ) 2.5 MG tablet Take 1 tablet (2.5 mg total) by mouth 3 (three) times daily with meals. 03/09/24   Newlin, Enobong, MD  pantoprazole  (PROTONIX ) 40 MG tablet Take 1 tablet (40 mg total) by mouth 2 (two) times daily. 03/09/24 04/09/24  Newlin, Enobong, MD  QUEtiapine  (SEROQUEL ) 100 MG tablet Take 1 tablet (100 mg total) by mouth 2 (two) times daily. 03/09/24   Newlin, Enobong, MD  tamsulosin  (FLOMAX ) 0.4 MG CAPS capsule Take 1 capsule (0.4 mg total) by mouth daily. 03/09/24   Newlin, Enobong, MD  topiramate  (TOPAMAX ) 25 MG tablet Take 1 tablet (25 mg total) by mouth daily. 03/09/24   Newlin, Enobong, MD    Allergies: Patient has no known allergies.    Review of Systems  Respiratory:  Positive for shortness of breath.   Cardiovascular:  Positive for chest pain and leg swelling.  Gastrointestinal:  Positive for abdominal pain.  Musculoskeletal:  Positive for back pain.    Updated Vital Signs BP 112/88   Pulse 74   Temp 98.5 F (36.9 C) (Oral)   Resp 19   Ht 5' 8 (1.727 m)   Wt 75.8 kg   SpO2 100%  BMI 25.41 kg/m   Physical Exam Vitals and nursing note reviewed.  Constitutional:      General: He is not in acute distress.    Appearance: He is well-developed. He is not ill-appearing or diaphoretic.  HENT:     Head: Normocephalic and atraumatic.  Cardiovascular:     Rate and Rhythm: Normal rate and regular rhythm.     Heart sounds: Normal heart sounds.  Pulmonary:     Effort: Pulmonary effort is normal. No tachypnea, accessory muscle usage or respiratory distress.     Breath sounds: Normal breath sounds. No wheezing, rhonchi or rales.  Abdominal:     Palpations: Abdomen is soft.     Tenderness: There is abdominal tenderness (generalized).  Musculoskeletal:     Right lower  leg: Edema present.     Left lower leg: Edema present.     Comments: Mild pitting edema to lower legs  Skin:    General: Skin is warm and dry.  Neurological:     Mental Status: He is alert.     (all labs ordered are listed, but only abnormal results are displayed) Labs Reviewed  BASIC METABOLIC PANEL WITH GFR - Abnormal; Notable for the following components:      Result Value   Potassium 3.3 (*)    CO2 18 (*)    Glucose, Bld 111 (*)    Creatinine, Ser 1.50 (*)    GFR, Estimated 53 (*)    All other components within normal limits  CBC - Abnormal; Notable for the following components:   Hemoglobin 9.6 (*)    HCT 32.1 (*)    MCV 73.3 (*)    MCH 21.9 (*)    MCHC 29.9 (*)    RDW 27.9 (*)    All other components within normal limits  BRAIN NATRIURETIC PEPTIDE - Abnormal; Notable for the following components:   B Natriuretic Peptide 564.3 (*)    All other components within normal limits  URINALYSIS, ROUTINE W REFLEX MICROSCOPIC - Abnormal; Notable for the following components:   Color, Urine AMBER (*)    APPearance HAZY (*)    Glucose, UA >=500 (*)    Hgb urine dipstick LARGE (*)    Protein, ur 100 (*)    All other components within normal limits  TROPONIN I (HIGH SENSITIVITY) - Abnormal; Notable for the following components:   Troponin I (High Sensitivity) 46 (*)    All other components within normal limits  TROPONIN I (HIGH SENSITIVITY) - Abnormal; Notable for the following components:   Troponin I (High Sensitivity) 41 (*)    All other components within normal limits  DIFFERENTIAL    EKG: EKG Interpretation Date/Time:  Thursday March 10 2024 08:40:17 EST Ventricular Rate:  72 PR Interval:  118 QRS Duration:  171 QT Interval:  477 QTC Calculation: 523 R Axis:   -74  Text Interpretation: Accelerated junctional rhythm IVCD, consider atypical RBBB Abnrm T, consider ischemia, anterolateral lds similar to Sept 2025 Confirmed by Freddi Hamilton [phone]) on 03/10/2024  8:48:49 AM  Radiology: DG Chest Portable 1 View Result Date: 03/09/2024 CLINICAL DATA:  Shortness of breath EXAM: PORTABLE CHEST 1 VIEW COMPARISON:  01/15/2024 FINDINGS: With left-sided multi lead pacing device. Cardiomegaly with mild central congestion. No pleural effusion, focal opacity or pneumothorax IMPRESSION: Cardiomegaly with mild central congestion. Electronically Signed   By: Luke Bun M.D.   On: 03/09/2024 22:05     Procedures   Medications Ordered in the ED  furosemide  (LASIX ) injection  40 mg (40 mg Intravenous Given 03/10/24 0944)  potassium chloride  SA (KLOR-CON  M) CR tablet 40 mEq (40 mEq Oral Given 03/10/24 0912)  dicyclomine (BENTYL) capsule 20 mg (20 mg Oral Given 03/10/24 0912)                                    Medical Decision Making Amount and/or Complexity of Data Reviewed External Data Reviewed: notes. Labs:     Details: Troponins minimally elevated but flat and stable compared to baseline Radiology: independent interpretation performed.    Details: No pulmonary edema ECG/medicine tests: ordered and independent interpretation performed.    Details: Unchanged from baseline  Risk Prescription drug management.   Patient appears to be having recurrent swelling from not having his torsemide .  It is unclear why this was not represcribed when he asked.  Given his history of significant systolic CHF it does not appear obvious why he would not be on torsemide .  Will give a dose of IV Lasix  at his request and also refill his torsemide  prescription and he seems to have some chronic hypokalemia so in addition to this we will also provide some potassium.  He will need to follow-up with PCP and cardiology.  Chart review shows he has had GI workup and multiple CTs for this abdominal pain for over a year.  Seems like it is chronic in nature.  Per his report nothing is new or worse or different, so I do not think further workup is needed.  Otherwise low suspicion for ACS, PE,  dissection.  He is not requiring any oxygen currently and feels well enough for discharge, prescriptions have been sent to his preferred pharmacy and he appears stable for discharge home with return precautions.  Addendum: Just prior to discharge, patient was asking for reevaluation for his low back itching.  This has been ongoing for months to years.  There is no obvious lesion to explain why.  Will apply some hydrocortisone cream.  Final diagnoses:  Acute on chronic systolic congestive heart failure (HCC)  Hypokalemia  Chronic abdominal pain    ED Discharge Orders          Ordered    torsemide  (DEMADEX ) 20 MG tablet  Daily        03/10/24 0905    potassium chloride  (KLOR-CON  M) 10 MEQ tablet  Daily        03/10/24 0905    Ambulatory referral to Cardiology       Comments: If you have not heard from the Cardiology office within the next 72 hours please call [phone].   03/10/24 9053               Freddi Hamilton, MD 03/10/24 9048    Freddi Hamilton, MD 03/10/24 1030

## 2024-03-11 ENCOUNTER — Other Ambulatory Visit: Payer: Self-pay

## 2024-03-11 ENCOUNTER — Other Ambulatory Visit (HOSPITAL_COMMUNITY): Payer: Self-pay

## 2024-03-14 ENCOUNTER — Other Ambulatory Visit: Payer: Self-pay

## 2024-03-14 ENCOUNTER — Emergency Department (HOSPITAL_COMMUNITY)

## 2024-03-14 ENCOUNTER — Emergency Department (HOSPITAL_COMMUNITY)
Admission: EM | Admit: 2024-03-14 | Discharge: 2024-03-15 | Disposition: A | Discharge status: Home / Self Care | Attending: Emergency Medicine | Admitting: Emergency Medicine

## 2024-03-14 ENCOUNTER — Encounter (HOSPITAL_COMMUNITY): Payer: Self-pay

## 2024-03-14 DIAGNOSIS — I509 Heart failure, unspecified: Secondary | ICD-10-CM | POA: Insufficient documentation

## 2024-03-14 DIAGNOSIS — I130 Hypertensive heart and chronic kidney disease with heart failure and stage 1 through stage 4 chronic kidney disease, or unspecified chronic kidney disease: Secondary | ICD-10-CM | POA: Insufficient documentation

## 2024-03-14 DIAGNOSIS — D631 Anemia in chronic kidney disease: Secondary | ICD-10-CM | POA: Insufficient documentation

## 2024-03-14 DIAGNOSIS — E876 Hypokalemia: Secondary | ICD-10-CM | POA: Insufficient documentation

## 2024-03-14 DIAGNOSIS — N189 Chronic kidney disease, unspecified: Secondary | ICD-10-CM | POA: Insufficient documentation

## 2024-03-14 DIAGNOSIS — E8770 Fluid overload, unspecified: Secondary | ICD-10-CM | POA: Diagnosis not present

## 2024-03-14 DIAGNOSIS — R600 Localized edema: Secondary | ICD-10-CM | POA: Insufficient documentation

## 2024-03-14 DIAGNOSIS — R0602 Shortness of breath: Secondary | ICD-10-CM | POA: Diagnosis not present

## 2024-03-14 DIAGNOSIS — Z79899 Other long term (current) drug therapy: Secondary | ICD-10-CM | POA: Insufficient documentation

## 2024-03-14 DIAGNOSIS — I2510 Atherosclerotic heart disease of native coronary artery without angina pectoris: Secondary | ICD-10-CM | POA: Insufficient documentation

## 2024-03-14 DIAGNOSIS — R079 Chest pain, unspecified: Secondary | ICD-10-CM | POA: Diagnosis not present

## 2024-03-14 DIAGNOSIS — I517 Cardiomegaly: Secondary | ICD-10-CM | POA: Diagnosis not present

## 2024-03-14 LAB — CBC WITH DIFFERENTIAL/PLATELET
Abs Immature Granulocytes: 0.01 K/uL (ref 0.00–0.07)
Basophils Absolute: 0.1 K/uL (ref 0.0–0.1)
Basophils Relative: 2 %
Eosinophils Absolute: 0.2 K/uL (ref 0.0–0.5)
Eosinophils Relative: 4 %
HCT: 29.3 % — ABNORMAL LOW (ref 39.0–52.0)
Hemoglobin: 9.1 g/dL — ABNORMAL LOW (ref 13.0–17.0)
Immature Granulocytes: 0 %
Lymphocytes Relative: 23 %
Lymphs Abs: 1 K/uL (ref 0.7–4.0)
MCH: 22.4 pg — ABNORMAL LOW (ref 26.0–34.0)
MCHC: 31.1 g/dL (ref 30.0–36.0)
MCV: 72 fL — ABNORMAL LOW (ref 80.0–100.0)
Monocytes Absolute: 1 K/uL (ref 0.1–1.0)
Monocytes Relative: 21 %
Neutro Abs: 2.3 K/uL (ref 1.7–7.7)
Neutrophils Relative %: 50 %
Platelets: 212 K/uL (ref 150–400)
RBC: 4.07 MIL/uL — ABNORMAL LOW (ref 4.22–5.81)
RDW: 27.9 % — ABNORMAL HIGH (ref 11.5–15.5)
WBC: 4.5 K/uL (ref 4.0–10.5)
nRBC: 0 % (ref 0.0–0.2)

## 2024-03-14 LAB — RESP PANEL BY RT-PCR (RSV, FLU A&B, COVID)  RVPGX2
Influenza A by PCR: NEGATIVE
Influenza B by PCR: NEGATIVE
Resp Syncytial Virus by PCR: NEGATIVE
SARS Coronavirus 2 by RT PCR: NEGATIVE

## 2024-03-14 LAB — BASIC METABOLIC PANEL WITH GFR
Anion gap: 16 — ABNORMAL HIGH (ref 5–15)
BUN: 6 mg/dL (ref 6–20)
CO2: 19 mmol/L — ABNORMAL LOW (ref 22–32)
Calcium: 9.5 mg/dL (ref 8.9–10.3)
Chloride: 102 mmol/L (ref 98–111)
Creatinine, Ser: 1.33 mg/dL — ABNORMAL HIGH (ref 0.61–1.24)
GFR, Estimated: >60 mL/min (ref >60)
Glucose, Bld: 88 mg/dL (ref 70–99)
Potassium: 3.4 mmol/L — ABNORMAL LOW (ref 3.5–5.1)
Sodium: 137 mmol/L (ref 135–145)

## 2024-03-14 LAB — BRAIN NATRIURETIC PEPTIDE: B Natriuretic Peptide: 1158.9 pg/mL — ABNORMAL HIGH (ref 0.0–100.0)

## 2024-03-14 LAB — TROPONIN I (HIGH SENSITIVITY): Troponin I (High Sensitivity): 47 ng/L — ABNORMAL HIGH (ref <18)

## 2024-03-14 NOTE — ED Notes (Signed)
 Patient reports PMHX of CHF & that he has been out of fluid pills for 7 days. Reports pain in bilateral lower legs.

## 2024-03-14 NOTE — ED Triage Notes (Signed)
 C/O bilateral leg swelling and CP today/ migraines. Hx of CHF. Axox4. C/O SHOB.

## 2024-03-14 NOTE — ED Provider Triage Note (Signed)
 Emergency Medicine Provider Triage Evaluation Note  Cody Guzman , a 58 y.o. male  was evaluated in triage.  Pt complains of chest pain, shortness of breath.  History of similar.  Has been out of his medications for the last 9 days.  He also admits to headache.  He feels like his lower legs are swollen bilaterally.  No redness or warmth.  He admits to some PND orthopnea.  Review of Systems  Positive: CP, shortness of breath, lower extremity edema, headache Negative: Fever, back pain, abdominal pain  Physical Exam  Ht 5' 8 (1.727 m)   Wt 83.9 kg   BMI 28.13 kg/m  Gen:   Awake, no distress   Resp:  Normal effort  MSK:   Moves extremities without difficulty, pitting edema bil Other:    Medical Decision Making  Medically screening exam initiated at 6:31 PM.  Appropriate orders placed.  Wilborn Akerele-Ale was informed that the remainder of the evaluation will be completed by another provider, this initial triage assessment does not replace that evaluation, and the importance of remaining in the ED until their evaluation is complete.  Cp, sob, ha, le swelling   Kamisha Ell A, PA-C 03/14/24 1832

## 2024-03-14 NOTE — ED Notes (Signed)
 Pt was stuck 3x in triage for his second trop. Pt states that since he completed chemo his veins are very fragile. Unable to attain blood at this time.

## 2024-03-14 NOTE — ED Provider Notes (Signed)
 [place] EMERGENCY DEPARTMENT AT [facility] Provider Note   CSN: [id number] Arrival date & time: 03/14/24  1810     Patient presents with: No chief complaint on file.   Cody Guzman is a 58 y.o. male.  {Add pertinent medical, surgical, social history, OB history to YEP:67052} HPI Patient presents for chest pain and shortness of breath.  Medical history includes CHF, CAD, neuropathy, chronic pain, CKD, HTN, atrial flutter, anemia.  He has been nonadherent to his home medications for the past 9 days.  He has an increase swelling in BLE.  He has had chest pain and shortness of breath. ***    Prior to Admission medications   Medication Sig Start Date End Date Taking? Authorizing Provider  albuterol  (VENTOLIN  HFA) 108 (90 Base) MCG/ACT inhaler Inhale 1-2 puffs into the lungs every 6 (six) hours as needed for wheezing or shortness of breath. 03/09/24   Newlin, Enobong, MD  allopurinol  (ZYLOPRIM ) 100 MG tablet Take 1 tablet (100 mg total) by mouth daily. 03/09/24   Newlin, Enobong, MD  amiodarone  (PACERONE ) 200 MG tablet Take 1 tablet (200 mg total) by mouth daily. 03/09/24   Newlin, Enobong, MD  atorvastatin  (LIPITOR ) 80 MG tablet Take 1 tablet (80 mg total) by mouth daily. 03/09/24   Newlin, Enobong, MD  colchicine  0.6 MG tablet Take 1 tablet (0.6 mg total) by mouth every other day. 03/09/24   Newlin, Enobong, MD  digoxin  (LANOXIN ) 0.125 MG tablet Take 1 tablet (0.125 mg total) by mouth daily. 03/09/24   Newlin, Enobong, MD  empagliflozin  (JARDIANCE ) 10 MG TABS tablet Take 1 tablet (10 mg total) by mouth daily before breakfast. 03/09/24   Delbert Clam, MD  midodrine  (PROAMATINE ) 2.5 MG tablet Take 1 tablet (2.5 mg total) by mouth 3 (three) times daily with meals. 03/09/24   Newlin, Enobong, MD  pantoprazole  (PROTONIX ) 40 MG tablet Take 1 tablet (40 mg total) by mouth 2 (two) times daily. 03/09/24 04/10/24  Newlin, Enobong, MD  potassium chloride  (KLOR-CON  M) 10 MEQ tablet Take 1  tablet (10 mEq total) by mouth daily. 03/10/24   Freddi Hamilton, MD  QUEtiapine  (SEROQUEL ) 100 MG tablet Take 1 tablet (100 mg total) by mouth 2 (two) times daily. 03/09/24   Newlin, Enobong, MD  tamsulosin  (FLOMAX ) 0.4 MG CAPS capsule Take 1 capsule (0.4 mg total) by mouth daily. 03/09/24   Newlin, Enobong, MD  topiramate  (TOPAMAX ) 25 MG tablet Take 1 tablet (25 mg total) by mouth daily. 03/09/24   Newlin, Enobong, MD  torsemide  (DEMADEX ) 20 MG tablet Take 1 tablet (20 mg total) by mouth daily. 03/10/24   Freddi Hamilton, MD    Allergies: Patient has no known allergies.    Review of Systems  Updated Vital Signs BP 103/78   Pulse 73   Temp 98.1 F (36.7 C)   Resp 18   Ht 5' 8 (1.727 m)   Wt 83.9 kg   SpO2 100%   BMI 28.13 kg/m   Physical Exam  (all labs ordered are listed, but only abnormal results are displayed) Labs Reviewed  CBC WITH DIFFERENTIAL/PLATELET - Abnormal; Notable for the following components:      Result Value   RBC 4.07 (*)    Hemoglobin 9.1 (*)    HCT 29.3 (*)    MCV 72.0 (*)    MCH 22.4 (*)    RDW 27.9 (*)    All other components within normal limits  BASIC METABOLIC PANEL WITH GFR - Abnormal; Notable for  the following components:   Potassium 3.4 (*)    CO2 19 (*)    Creatinine, Ser 1.33 (*)    Anion gap 16 (*)    All other components within normal limits  BRAIN NATRIURETIC PEPTIDE - Abnormal; Notable for the following components:   B Natriuretic Peptide 1,158.9 (*)    All other components within normal limits  TROPONIN I (HIGH SENSITIVITY) - Abnormal; Notable for the following components:   Troponin I (High Sensitivity) 47 (*)    All other components within normal limits  RESP PANEL BY RT-PCR (RSV, FLU A&B, COVID)  [id number]  TROPONIN I (HIGH SENSITIVITY)    EKG: None  Radiology: DG Chest 2 View Result Date: 03/14/2024 EXAM: 2 VIEW(S) XRAY OF THE CHEST 03/14/2024 08:14:00 PM COMPARISON: 03/09/2024 CLINICAL HISTORY: cp FINDINGS: LUNGS AND PLEURA:  No focal pulmonary opacity. No pulmonary edema. No pleural effusion. No pneumothorax. HEART AND MEDIASTINUM: Stable cardiomegaly. BONES AND SOFT TISSUES: Left chest wall ICD in place. No acute osseous abnormality. IMPRESSION: 1. Stable cardiomegaly. No frank interstitial edema. Electronically signed by: Pinkie Pebbles MD 03/14/2024 08:19 PM EST RP Workstation: [id number]    {Document cardiac monitor, telemetry assessment procedure when appropriate:32947} Procedures   Medications Ordered in the ED - No data to display    {Click here for ABCD2, HEART and other calculators REFRESH Note before signing:1}                              Medical Decision Making  ***  {Document critical care time when appropriate  Document review of labs and clinical decision tools ie CHADS2VASC2, etc  Document your independent review of radiology images and any outside records  Document your discussion with family members, caretakers and with consultants  Document social determinants of health affecting pt's care  Document your decision making why or why not admission, treatments were needed:32947:::1}   Final diagnoses:  None    ED Discharge Orders     None

## 2024-03-15 ENCOUNTER — Other Ambulatory Visit: Payer: Self-pay

## 2024-03-15 ENCOUNTER — Other Ambulatory Visit: Payer: Self-pay | Admitting: Nurse Practitioner

## 2024-03-15 DIAGNOSIS — I5022 Chronic systolic (congestive) heart failure: Secondary | ICD-10-CM

## 2024-03-15 DIAGNOSIS — E8770 Fluid overload, unspecified: Secondary | ICD-10-CM | POA: Diagnosis not present

## 2024-03-15 LAB — TROPONIN I (HIGH SENSITIVITY): Troponin I (High Sensitivity): 55 ng/L — ABNORMAL HIGH (ref <18)

## 2024-03-15 LAB — MAGNESIUM: Magnesium: 1.5 mg/dL — ABNORMAL LOW (ref 1.7–2.4)

## 2024-03-15 MED ORDER — MAGNESIUM SULFATE 2 GM/50ML IV SOLN
2.0000 g | Freq: Once | INTRAVENOUS | Status: AC
  Administered 2024-03-15: 2 g via INTRAVENOUS
  Filled 2024-03-15: qty 50

## 2024-03-15 MED ORDER — POTASSIUM CHLORIDE CRYS ER 20 MEQ PO TBCR
40.0000 meq | EXTENDED_RELEASE_TABLET | Freq: Once | ORAL | Status: AC
  Administered 2024-03-15: 40 meq via ORAL
  Filled 2024-03-15: qty 2

## 2024-03-15 MED ORDER — FUROSEMIDE 10 MG/ML IJ SOLN
40.0000 mg | Freq: Once | INTRAMUSCULAR | Status: AC
  Administered 2024-03-15: 40 mg via INTRAVENOUS
  Filled 2024-03-15: qty 4

## 2024-03-15 MED ORDER — FUROSEMIDE 10 MG/ML IJ SOLN
40.0000 mg | Freq: Once | INTRAMUSCULAR | Status: DC
  Filled 2024-03-15: qty 4

## 2024-03-15 MED ORDER — DIPHENHYDRAMINE HCL 50 MG/ML IJ SOLN
12.5000 mg | Freq: Once | INTRAMUSCULAR | Status: AC
  Administered 2024-03-15: 12.5 mg via INTRAVENOUS
  Filled 2024-03-15: qty 1

## 2024-03-15 MED ORDER — FUROSEMIDE 10 MG/ML IJ SOLN
40.0000 mg | Freq: Once | INTRAMUSCULAR | Status: AC
  Administered 2024-03-15: 40 mg via INTRAVENOUS

## 2024-03-15 MED ORDER — METOCLOPRAMIDE HCL 5 MG/ML IJ SOLN
10.0000 mg | Freq: Once | INTRAMUSCULAR | Status: AC
  Administered 2024-03-15: 10 mg via INTRAVENOUS
  Filled 2024-03-15: qty 2

## 2024-03-15 NOTE — ED Notes (Signed)
 Went to room to place patient on cardiac monitor & obtain IV, patient refusing, stating Im freezing its just gonna have to wait. Explained to patient reason for monitoring and IV.

## 2024-03-15 NOTE — Discharge Instructions (Signed)
 Ensure that you pick up your torsemide  and take as prescribed.  Return to the emergency department for any new or worsening symptoms of concern.

## 2024-03-16 ENCOUNTER — Ambulatory Visit: Payer: Medicaid Other

## 2024-03-16 DIAGNOSIS — I480 Paroxysmal atrial fibrillation: Secondary | ICD-10-CM

## 2024-03-16 LAB — CUP PACEART REMOTE DEVICE CHECK
Battery Remaining Longevity: 63 mo
Battery Remaining Percentage: 72 %
Battery Voltage: 3.01 V
Brady Statistic AP VP Percent: 5.5 %
Brady Statistic AP VS Percent: 1 %
Brady Statistic AS VP Percent: 91 %
Brady Statistic AS VS Percent: 1.6 %
Brady Statistic RA Percent Paced: 1.8 %
Date Time Interrogation Session: 20251112033104
HighPow Impedance: 45 Ohm
HighPow Impedance: 45 Ohm
Implantable Lead Connection Status: 753985
Implantable Lead Connection Status: 753985
Implantable Lead Connection Status: 753985
Implantable Lead Implant Date: 20240201
Implantable Lead Implant Date: 20240201
Implantable Lead Implant Date: 20240201
Implantable Lead Location: 753858
Implantable Lead Location: 753859
Implantable Lead Location: 753860
Implantable Lead Model: 3830
Implantable Lead Model: 7122
Implantable Pulse Generator Implant Date: 20240201
Lead Channel Impedance Value: 340 Ohm
Lead Channel Impedance Value: 360 Ohm
Lead Channel Impedance Value: 510 Ohm
Lead Channel Pacing Threshold Amplitude: 0.75 V
Lead Channel Pacing Threshold Amplitude: 0.75 V
Lead Channel Pacing Threshold Amplitude: 1 V
Lead Channel Pacing Threshold Pulse Width: 0.5 ms
Lead Channel Pacing Threshold Pulse Width: 0.5 ms
Lead Channel Pacing Threshold Pulse Width: 1 ms
Lead Channel Sensing Intrinsic Amplitude: 0.2 mV
Lead Channel Sensing Intrinsic Amplitude: 5.2 mV
Lead Channel Setting Pacing Amplitude: 1.5 V
Lead Channel Setting Pacing Amplitude: 2 V
Lead Channel Setting Pacing Amplitude: 2.5 V
Lead Channel Setting Pacing Pulse Width: 0.5 ms
Lead Channel Setting Pacing Pulse Width: 0.5 ms
Lead Channel Setting Sensing Sensitivity: 1 mV
Pulse Gen Serial Number: 5559393
Zone Setting Status: 755011

## 2024-03-21 ENCOUNTER — Other Ambulatory Visit: Payer: Self-pay

## 2024-03-21 NOTE — Progress Notes (Signed)
 Remote ICD Transmission

## 2024-03-24 ENCOUNTER — Other Ambulatory Visit: Payer: Self-pay | Admitting: Nurse Practitioner

## 2024-03-24 DIAGNOSIS — M1A09X0 Idiopathic chronic gout, multiple sites, without tophus (tophi): Secondary | ICD-10-CM

## 2024-03-24 NOTE — Progress Notes (Deleted)
 Electrophysiology Office Note:   Date:  03/24/2024  ID:  Cody Guzman, DOB 05-25-1965, MRN [id number]  Primary Cardiologist: Vina Gull, MD Primary Heart Failure: None Electrophysiologist: OLE ONEIDA HOLTS, MD   {Click to update primary MD,subspecialty MD or APP then REFRESH:1}    History of Present Illness:   Cody Guzman is a 58 y.o. male with h/o HFrEF / NICM s/p CRT-D, AF/AFL, RBBB/LBBB, VHD: MVR/TVR, pHTN, Bipolar Disorder, THC abuse, CKD IIIb  seen today for routine electrophysiology followup.   Admitted 9/8-9/18/25 with GIB.  During that admission, amiodarone  was increased to 200 mg and digoxin  was added. He was to follow up in one week with Cardiology to resume Eliquis .  He unfortunately did not follow up. Not clear if he restarted his Eliquis .  He called the office on 03/09/24 reporting shortness of breath & was seen in the ER for dyspnea.  He had run out of torsemide . He was given an Rx for diuretics but did not pick them up.   Seen again in ER on 03/14/24 for shortness of breath after running out of his home torsemide .  He had LE swelling and SOB.  Since last being seen in our clinic the patient reports doing ***.    He ***denies chest pain, palpitations, dyspnea, PND, orthopnea, nausea, vomiting, dizziness, syncope, edema, weight gain, or early satiety.   Review of systems complete and found to be negative unless listed in HPI.   EP Information / Studies Reviewed:    EKG is not ordered today. EKG from 03/15/24 reviewed which showed SR 76 bpm, LAD, RBBB      ICD Interrogation-  reviewed in detail today,  See PACEART report.  Device History: Abbott BiV ICD implanted 06/05/22 for ICM  History of appropriate therapy: No History of AAD therapy: Yes; currently on amiodarone      Arrhythmia / AAD / Pertinent EP Studies AF / AFL    Risk Assessment/Calculations:    CHA2DS2-VASc Score = 5  {Confirm score is correct.  If not, click here to update score.   REFRESH note.  :1} This indicates a 7.2% annual risk of stroke. The patient's score is based upon: CHF History: 1 HTN History: 1 Diabetes History: 0 Stroke History: 2 Vascular Disease History: 1 Age Score: 0 Gender Score: 0   {This patient has a significant risk of stroke if diagnosed with atrial fibrillation.  Please consider VKA or DOAC agent for anticoagulation if the bleeding risk is acceptable.   You can also use the SmartPhrase .[email or web address] for documentation.   :[id number]} No BP recorded.  {Refresh Note OR Click here to enter BP  :1}***        Physical Exam:   VS:  There were no vitals taken for this visit.   Wt Readings from Last 3 Encounters:  03/14/24 185 lb (83.9 kg)  03/09/24 167 lb 1.7 oz (75.8 kg)  01/21/24 167 lb (75.8 kg)     GEN: Well nourished, well developed in no acute distress NECK: No JVD; No carotid bruits CARDIAC: {EPRHYTHM:28826}, no murmurs, rubs, gallops RESPIRATORY:  Clear to auscultation without rales, wheezing or rhonchi  ABDOMEN: Soft, non-tender, non-distended EXTREMITIES:  No edema; No deformity   ASSESSMENT AND PLAN:    Chronic Systolic Dysfunction due to NICM s/p Abbott CRT-D  VT / NSVT  -euvolemic on exam / *** -***% BiV pacing  -Stable on an appropriate medical regimen -Normal ICD function -See Pace Art report -No changes today  Paroxysmal Atrial Fibrillation  AFL  High Risk Medication Monitoring: Amiodarone   CHA2DS2-VASc 5  -continue amiodarone   -continue digoxin   -on OAC ? If not resume ***   Secondary Hypercoagulable State  -continue ***, dose reviewed and appropriate by ***   Hypertension  -well controlled on current regimen ***   Disposition:   Follow up with EP APP {EPFOLLOW UP:28173} ***new EP MD reviewed    Signed, Daphne Barrack, NP-C, AGACNP-BC [facility] - Electrophysiology  03/24/2024, 10:06 PM

## 2024-03-25 ENCOUNTER — Ambulatory Visit: Attending: Pulmonary Disease | Admitting: Pulmonary Disease

## 2024-03-28 ENCOUNTER — Encounter: Payer: Self-pay | Admitting: Pulmonary Disease

## 2024-03-29 ENCOUNTER — Ambulatory Visit (HOSPITAL_COMMUNITY)
Admission: RE | Admit: 2024-03-29 | Discharge: 2024-03-29 | Disposition: A | Discharge status: Home / Self Care | Source: Ambulatory Visit
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
## 2024-03-30 ENCOUNTER — Telehealth (HOSPITAL_COMMUNITY): Payer: Self-pay | Admitting: Surgery

## 2024-03-30 NOTE — Telephone Encounter (Signed)
 I called patient regarding No Show appt 11/25.  He said he needed to travel out of town emergently.  He has rescheduled in AHF Clinic for follow-up 12/2.

## 2024-04-05 ENCOUNTER — Telehealth (HOSPITAL_COMMUNITY): Payer: Self-pay

## 2024-04-05 NOTE — Telephone Encounter (Signed)
 Called to confirm/remind patient of their appointment at the [facility] on 04/06/24 2:00.   Appointment:   [] Confirmed  [x] Left mess   [] No answer/No voice mail  [] VM Full/unable to leave message  [] Phone not in service  Patient reminded to bring all medications and/or complete list.  Confirmed patient has transportation. Gave directions, instructed to utilize valet parking.
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
## 2024-04-06 ENCOUNTER — Ambulatory Visit (HOSPITAL_COMMUNITY)

## 2024-04-08 ENCOUNTER — Other Ambulatory Visit (HOSPITAL_COMMUNITY): Payer: Self-pay

## 2024-04-16 ENCOUNTER — Other Ambulatory Visit: Payer: Self-pay | Admitting: Nurse Practitioner

## 2024-04-16 DIAGNOSIS — M1A09X0 Idiopathic chronic gout, multiple sites, without tophus (tophi): Secondary | ICD-10-CM

## 2024-04-27 ENCOUNTER — Other Ambulatory Visit: Payer: Self-pay | Admitting: Critical Care Medicine

## 2024-04-27 DIAGNOSIS — E785 Hyperlipidemia, unspecified: Secondary | ICD-10-CM

## 2024-04-27 DIAGNOSIS — M1A09X0 Idiopathic chronic gout, multiple sites, without tophus (tophi): Secondary | ICD-10-CM

## 2024-04-29 NOTE — Telephone Encounter (Signed)
 Requested medication (s) are due for refill today: yes  Requested medication (s) are on the active medication list: yes  Last refill:  03/29/24  Future visit scheduled: no  Notes to clinic:  Unable to refill per protocol, courtesy refill already given, routing for provider approval.      Requested Prescriptions  Pending Prescriptions Disp Refills   amiodarone  (PACERONE ) 100 MG tablet [Pharmacy Med Name: AMIODARONE  HYDROCHLORIDE 100 MG Oral Tablet] 90 tablet 3    Sig: TAKE 1 TABLET EVERY DAY     Not Delegated - Cardiovascular: Antiarrhythmic Agents - amiodarone  Failed - 04/29/2024  2:51 PM      Failed - This refill cannot be delegated      Failed - Manual Review: Eye exam recommended every 12 months      Failed - TSH in normal range and within 360 days    TSH  Date Value Ref Range Status  05/16/2021 3.279 0.350 - 4.500 uIU/mL Final    Comment:    Performed by a 3rd Generation assay with a functional sensitivity of <=0.01 uIU/mL. Performed at [facility], [name], [place], [place] [address]          Failed - Mg Level in normal range and within 360 days    Magnesium   Date Value Ref Range Status  03/15/2024 1.5 (L) 1.7 - 2.4 mg/dL Final    Comment:    Performed at [facility], [address] [place], [place] [address]         Failed - K in normal range and within 180 days    Potassium  Date Value Ref Range Status  03/14/2024 3.4 (L) 3.5 - 5.1 mmol/L Final         Passed - AST in normal range and within 180 days    AST  Date Value Ref Range Status  01/15/2024 32 15 - 41 U/L Final         Passed - ALT in normal range and within 180 days    ALT  Date Value Ref Range Status  01/15/2024 34 0 - 44 U/L Final         Passed - Patient had ECG in the last 180 days      Passed - Patient is not pregnant      Passed - Last BP in normal range    BP Readings from Last 1 Encounters:  03/15/24 114/85         Passed - Last Heart Rate in normal range     Pulse Readings from Last 1 Encounters:  03/15/24 79         Passed - Valid encounter within last 6 months    Recent Outpatient Visits           8 months ago Primary hypertension   [facility] [facility] [place] Dept Of [place]. [facility] Theotis Haze ORN, NP   11 months ago Allergic rhinitis due to other allergic trigger, unspecified seasonality   [facility] [facility] [place] [facility] Dept Of [place]. [facility] Theotis Haze ORN, NP   1 year ago Essential hypertension   [facility] [facility] [place] - [facility] Dept Of [place]. [facility] Theotis Haze ORN, NP   1 year ago Essential hypertension   [facility] [facility] [place] - [facility] Dept Of [place]. [facility] Brien Belvie BRAVO, MD   2 years ago Encounter  for Medicare annual wellness exam   [facility] [facility] - [facility] Dept Of [place]. [facility] Brien Belvie BRAVO, MD              Passed - Patient had chest x-ray within the last 6 months       atorvastatin  (LIPITOR ) 80 MG tablet [Pharmacy Med Name: ATORVASTATIN  CALCIUM  80 MG Oral Tablet] 90 tablet 3    Sig: TAKE 1 TABLET EVERY DAY     Cardiovascular:  Antilipid - Statins Failed - 04/29/2024  2:51 PM      Failed - Lipid Panel in normal range within the last 12 months    Cholesterol, Total  Date Value Ref Range Status  02/11/2023 196 100 - 199 mg/dL Final   LDL Chol Calc (NIH)  Date Value Ref Range Status  02/11/2023 115 (H) 0 - 99 mg/dL Final   HDL  Date Value Ref Range Status  02/11/2023 67 >39 mg/dL Final   Triglycerides  Date Value Ref Range Status  02/11/2023 79 0 - 149 mg/dL Final         Passed - Patient is not pregnant      Passed - Valid encounter within last 12 months    Recent Outpatient Visits           8 months ago Primary hypertension   [place] [facility] - [facility] Dept Of [place]. [facility] Theotis Haze ORN, NP   11 months ago Allergic rhinitis due to other  allergic trigger, unspecified seasonality   [facility] [place] - [facility] Dept Of [place]. [facility] Theotis Haze ORN, NP   1 year ago Essential hypertension   [facility] [facility] - [facility] Dept Of [place]. [facility] Theotis Haze ORN, NP   1 year ago Essential hypertension   [place] [facility] [place] - [facility] Dept Of [place]. [facility] Brien Belvie BRAVO, MD   2 years ago Encounter for [employer] annual wellness exam   [place] [facility] [place] - A Dept Of [place]. [facility] Brien Belvie BRAVO, MD               ELIQUIS  5 MG TABS tablet [Pharmacy Med Name: ELIQUIS  5 MG Oral Tablet] 180 tablet 3    Sig: TAKE 1 TABLET TWICE DAILY     Hematology:  Anticoagulants - apixaban  Failed - 04/29/2024  2:51 PM      Failed - HGB in normal range and within 360 days    Hemoglobin  Date Value Ref Range Status  03/14/2024 9.1 (L) 13.0 - 17.0 g/dL Final  98/74/7975 84.3 13.0 - 17.7 g/dL Final   Total hemoglobin  Date Value Ref Range Status  01/21/2024 11.2 (L) 12.0 - 16.0 g/dL Final         Failed - HCT in normal range and within 360 days    HCT  Date Value Ref Range Status  03/14/2024 29.3 (L) 39.0 - 52.0 % Final   Hematocrit  Date Value Ref Range Status  05/29/2022 47.2 37.5 - 51.0 % Final         Failed - Cr in normal range and within 360 days    Creatinine, Ser  Date Value Ref Range Status  03/14/2024 1.33 (H) 0.61 - 1.24 mg/dL Final         Passed - PLT in normal range and within 360 days  Platelets  Date Value Ref Range Status  03/14/2024 212 150 - 400 K/uL Final  05/29/2022 256 150 - 450 x10E3/uL Final         Passed - AST in normal range and within 360 days    AST  Date Value Ref Range Status  01/15/2024 32 15 - 41 U/L Final         Passed - ALT in normal range and within 360 days    ALT  Date Value Ref Range Status  01/15/2024 34 0 - 44 U/L Final         Passed - Valid encounter within last 12  months    Recent Outpatient Visits           8 months ago Primary hypertension   [facility] [place] [facility] Of [place]. [facility] Theotis Haze ORN, NP   11 months ago Allergic rhinitis due to other allergic trigger, unspecified seasonality   [place] [facility] [place] - [facility] Dept Of [place]. [facility] Theotis Haze ORN, NP   1 year ago Essential hypertension   [place] [facility] [place] - A Dept Of [place]. [facility] Theotis Haze ORN, NP   1 year ago Essential hypertension   [place] [facility] [place] - [facility] Dept Of [place]. [facility] Brien Belvie BRAVO, MD   2 years ago Encounter for [employer] annual wellness exam   [place] [facility] [place] [place] - A Dept Of [place]. [facility] Brien Belvie BRAVO, MD               allopurinol  (ZYLOPRIM ) 100 MG tablet [Pharmacy Med Name: ALLOPURINOL  100 MG Oral Tablet] 90 tablet 3    Sig: TAKE 1 TABLET EVERY DAY     Endocrinology:  Gout Agents - allopurinol  Failed - 04/29/2024  2:51 PM      Failed - Uric Acid in normal range and within 360 days    Uric Acid  Date Value Ref Range Status  02/11/2023 6.1 3.8 - 8.4 mg/dL Final    Comment:               Therapeutic target for gout patients: <6.0         Failed - Cr in normal range and within 360 days    Creatinine, Ser  Date Value Ref Range Status  03/14/2024 1.33 (H) 0.61 - 1.24 mg/dL Final         Passed - Valid encounter within last 12 months    Recent Outpatient Visits           8 months ago Primary hypertension   [place] [facility] [place] [facility] Dept Of [place]. [facility] Theotis Haze ORN, NP   11 months ago Allergic rhinitis due to other allergic trigger, unspecified seasonality   [facility] [facility] [place] - [facility] Dept Of [place]. [facility] Theotis Haze ORN, NP   1 year ago Essential hypertension   [place] [facility] [place] - A Dept Of [place]. [facility] Theotis Haze ORN, NP   1 year ago Essential hypertension   [place] [facility] [place] - A Dept Of . [facility] Brien Belvie BRAVO, MD   2 years ago Encounter for [employer] annual wellness exam   [place] [facility] [place] - A Dept Of . [facility] Brien Belvie BRAVO,  MD              Passed - CBC within normal limits and completed in the last 12 months    WBC  Date Value Ref Range Status  03/14/2024 4.5 4.0 - 10.5 K/uL Final   RBC  Date Value Ref Range Status  03/14/2024 4.07 (L) 4.22 - 5.81 MIL/uL Final   Hemoglobin  Date Value Ref Range Status  03/14/2024 9.1 (L) 13.0 - 17.0 g/dL Final  98/74/7975 84.3 13.0 - 17.7 g/dL Final   Total hemoglobin  Date Value Ref Range Status  01/21/2024 11.2 (L) 12.0 - 16.0 g/dL Final   HCT  Date Value Ref Range Status  03/14/2024 29.3 (L) 39.0 - 52.0 % Final   Hematocrit  Date Value Ref Range Status  05/29/2022 47.2 37.5 - 51.0 % Final   MCHC  Date Value Ref Range Status  03/14/2024 31.1 30.0 - 36.0 g/dL Final   [place]  Date Value Ref Range Status  03/14/2024 22.4 (L) 26.0 - 34.0 pg Final   MCV  Date Value Ref Range Status  03/14/2024 72.0 (L) 80.0 - 100.0 fL Final  05/29/2022 91 79 - 97 fL Final   No results found for: PLTCOUNTKUC, LABPLAT, POCPLA RDW  Date Value Ref Range Status  03/14/2024 27.9 (H) 11.5 - 15.5 % Final  05/29/2022 13.5 11.6 - 15.4 % Final          irbesartan  (AVAPRO ) 75 MG tablet [Pharmacy Med Name: IRBESARTAN  75 MG Oral Tablet] 45 tablet 3    Sig: TAKE 1/2 TABLET EVERY DAY     Cardiovascular:  Angiotensin Receptor Blockers Failed - 04/29/2024  2:51 PM      Failed - Cr in normal range and within 180 days    Creatinine, Ser  Date Value Ref Range Status  03/14/2024 1.33 (H) 0.61 - 1.24 mg/dL Final         Failed - K in normal range and within 180 days    Potassium  Date Value Ref Range Status  03/14/2024 3.4 (L) 3.5 - 5.1 mmol/L Final          Passed - Patient is not pregnant      Passed - Last BP in normal range    BP Readings from Last 1 Encounters:  03/15/24 114/85         Passed - Valid encounter within last 6 months    Recent Outpatient Visits           8 months ago Primary hypertension   [place] [facility] [place] - [facility] Dept Of [place]. [facility] Theotis Haze ORN, NP   11 months ago Allergic rhinitis due to other allergic trigger, unspecified seasonality   [place] [facility] - [facility] Dept Of [place]. [facility] Theotis Haze ORN, NP   1 year ago Essential hypertension   [place] [facility] [place] - A Dept Of [place]. [facility] Theotis Haze ORN, NP   1 year ago Essential hypertension   [facility] [place] [facility] [place] - A Dept Of [place]. [facility] Brien Belvie BRAVO, MD   2 years ago Encounter for [employer] annual wellness exam   [place] [facility] [name] - [facility] Dept Of [place]. [facility] Brien Belvie BRAVO, MD

## 2024-05-05 ENCOUNTER — Other Ambulatory Visit: Payer: Self-pay

## 2024-05-05 ENCOUNTER — Emergency Department (HOSPITAL_COMMUNITY)

## 2024-05-05 ENCOUNTER — Inpatient Hospital Stay (HOSPITAL_COMMUNITY): Admission: EM | Admit: 2024-05-05 | Discharge: 2024-06-05 | DRG: 871 | Disposition: E

## 2024-05-05 ENCOUNTER — Encounter (HOSPITAL_COMMUNITY): Payer: Self-pay

## 2024-05-05 DIAGNOSIS — I8510 Secondary esophageal varices without bleeding: Secondary | ICD-10-CM | POA: Diagnosis present

## 2024-05-05 DIAGNOSIS — Z8673 Personal history of transient ischemic attack (TIA), and cerebral infarction without residual deficits: Secondary | ICD-10-CM

## 2024-05-05 DIAGNOSIS — I252 Old myocardial infarction: Secondary | ICD-10-CM

## 2024-05-05 DIAGNOSIS — Z955 Presence of coronary angioplasty implant and graft: Secondary | ICD-10-CM

## 2024-05-05 DIAGNOSIS — Z515 Encounter for palliative care: Secondary | ICD-10-CM

## 2024-05-05 DIAGNOSIS — R680 Hypothermia, not associated with low environmental temperature: Secondary | ICD-10-CM | POA: Diagnosis present

## 2024-05-05 DIAGNOSIS — Z8249 Family history of ischemic heart disease and other diseases of the circulatory system: Secondary | ICD-10-CM

## 2024-05-05 DIAGNOSIS — E860 Dehydration: Secondary | ICD-10-CM | POA: Diagnosis present

## 2024-05-05 DIAGNOSIS — E875 Hyperkalemia: Secondary | ICD-10-CM | POA: Diagnosis present

## 2024-05-05 DIAGNOSIS — F319 Bipolar disorder, unspecified: Secondary | ICD-10-CM | POA: Diagnosis present

## 2024-05-05 DIAGNOSIS — R6521 Severe sepsis with septic shock: Secondary | ICD-10-CM | POA: Diagnosis present

## 2024-05-05 DIAGNOSIS — G928 Other toxic encephalopathy: Secondary | ICD-10-CM | POA: Diagnosis present

## 2024-05-05 DIAGNOSIS — I240 Acute coronary thrombosis not resulting in myocardial infarction: Secondary | ICD-10-CM | POA: Diagnosis present

## 2024-05-05 DIAGNOSIS — Z66 Do not resuscitate: Secondary | ICD-10-CM | POA: Diagnosis present

## 2024-05-05 DIAGNOSIS — Z8419 Family history of other disorders of kidney and ureter: Secondary | ICD-10-CM

## 2024-05-05 DIAGNOSIS — Z818 Family history of other mental and behavioral disorders: Secondary | ICD-10-CM

## 2024-05-05 DIAGNOSIS — K625 Hemorrhage of anus and rectum: Secondary | ICD-10-CM

## 2024-05-05 DIAGNOSIS — R579 Shock, unspecified: Secondary | ICD-10-CM

## 2024-05-05 DIAGNOSIS — I071 Rheumatic tricuspid insufficiency: Secondary | ICD-10-CM | POA: Diagnosis present

## 2024-05-05 DIAGNOSIS — I480 Paroxysmal atrial fibrillation: Secondary | ICD-10-CM | POA: Diagnosis present

## 2024-05-05 DIAGNOSIS — K7200 Acute and subacute hepatic failure without coma: Secondary | ICD-10-CM | POA: Diagnosis present

## 2024-05-05 DIAGNOSIS — I5082 Biventricular heart failure: Secondary | ICD-10-CM | POA: Diagnosis present

## 2024-05-05 DIAGNOSIS — Z9221 Personal history of antineoplastic chemotherapy: Secondary | ICD-10-CM

## 2024-05-05 DIAGNOSIS — I130 Hypertensive heart and chronic kidney disease with heart failure and stage 1 through stage 4 chronic kidney disease, or unspecified chronic kidney disease: Secondary | ICD-10-CM | POA: Diagnosis present

## 2024-05-05 DIAGNOSIS — I482 Chronic atrial fibrillation, unspecified: Secondary | ICD-10-CM | POA: Diagnosis present

## 2024-05-05 DIAGNOSIS — D689 Coagulation defect, unspecified: Secondary | ICD-10-CM | POA: Diagnosis present

## 2024-05-05 DIAGNOSIS — Z8572 Personal history of non-Hodgkin lymphomas: Secondary | ICD-10-CM

## 2024-05-05 DIAGNOSIS — Z5982 Transportation insecurity: Secondary | ICD-10-CM

## 2024-05-05 DIAGNOSIS — N179 Acute kidney failure, unspecified: Secondary | ICD-10-CM | POA: Diagnosis present

## 2024-05-05 DIAGNOSIS — R570 Cardiogenic shock: Secondary | ICD-10-CM | POA: Diagnosis present

## 2024-05-05 DIAGNOSIS — E872 Acidosis, unspecified: Secondary | ICD-10-CM | POA: Diagnosis present

## 2024-05-05 DIAGNOSIS — D62 Acute posthemorrhagic anemia: Secondary | ICD-10-CM | POA: Diagnosis present

## 2024-05-05 DIAGNOSIS — A419 Sepsis, unspecified organism: Principal | ICD-10-CM | POA: Diagnosis present

## 2024-05-05 DIAGNOSIS — K921 Melena: Secondary | ICD-10-CM | POA: Diagnosis present

## 2024-05-05 DIAGNOSIS — F17210 Nicotine dependence, cigarettes, uncomplicated: Secondary | ICD-10-CM | POA: Diagnosis present

## 2024-05-05 DIAGNOSIS — K317 Polyp of stomach and duodenum: Secondary | ICD-10-CM | POA: Diagnosis present

## 2024-05-05 DIAGNOSIS — I8501 Esophageal varices with bleeding: Secondary | ICD-10-CM | POA: Diagnosis present

## 2024-05-05 DIAGNOSIS — E162 Hypoglycemia, unspecified: Secondary | ICD-10-CM | POA: Diagnosis present

## 2024-05-05 DIAGNOSIS — I428 Other cardiomyopathies: Secondary | ICD-10-CM | POA: Diagnosis present

## 2024-05-05 DIAGNOSIS — I5023 Acute on chronic systolic (congestive) heart failure: Secondary | ICD-10-CM | POA: Diagnosis present

## 2024-05-05 DIAGNOSIS — K7460 Unspecified cirrhosis of liver: Secondary | ICD-10-CM | POA: Diagnosis present

## 2024-05-05 DIAGNOSIS — I4892 Unspecified atrial flutter: Secondary | ICD-10-CM | POA: Diagnosis present

## 2024-05-05 DIAGNOSIS — K648 Other hemorrhoids: Secondary | ICD-10-CM | POA: Diagnosis present

## 2024-05-05 DIAGNOSIS — N183 Chronic kidney disease, stage 3 unspecified: Secondary | ICD-10-CM | POA: Diagnosis present

## 2024-05-05 DIAGNOSIS — J4520 Mild intermittent asthma, uncomplicated: Secondary | ICD-10-CM | POA: Diagnosis present

## 2024-05-05 DIAGNOSIS — N170 Acute kidney failure with tubular necrosis: Secondary | ICD-10-CM | POA: Diagnosis present

## 2024-05-05 DIAGNOSIS — T68XXXA Hypothermia, initial encounter: Principal | ICD-10-CM

## 2024-05-05 DIAGNOSIS — Z95810 Presence of automatic (implantable) cardiac defibrillator: Secondary | ICD-10-CM

## 2024-05-05 DIAGNOSIS — B957 Other staphylococcus as the cause of diseases classified elsewhere: Secondary | ICD-10-CM | POA: Diagnosis present

## 2024-05-05 DIAGNOSIS — J9601 Acute respiratory failure with hypoxia: Secondary | ICD-10-CM | POA: Diagnosis present

## 2024-05-05 DIAGNOSIS — Z7984 Long term (current) use of oral hypoglycemic drugs: Secondary | ICD-10-CM

## 2024-05-05 DIAGNOSIS — F4310 Post-traumatic stress disorder, unspecified: Secondary | ICD-10-CM | POA: Diagnosis present

## 2024-05-05 DIAGNOSIS — E871 Hypo-osmolality and hyponatremia: Secondary | ICD-10-CM | POA: Diagnosis present

## 2024-05-05 LAB — POC OCCULT BLOOD, ED: Fecal Occult Bld: POSITIVE — AB

## 2024-05-05 MED ORDER — PANTOPRAZOLE SODIUM 40 MG IV SOLR
40.0000 mg | Freq: Once | INTRAVENOUS | Status: AC
  Administered 2024-05-05: 40 mg via INTRAVENOUS
  Filled 2024-05-05: qty 10

## 2024-05-05 MED ORDER — OCTREOTIDE LOAD VIA INFUSION
50.0000 ug | Freq: Once | INTRAVENOUS | Status: AC
  Administered 2024-05-06: 50 ug via INTRAVENOUS
  Filled 2024-05-05: qty 25

## 2024-05-05 MED ORDER — SODIUM CHLORIDE 0.9 % IV BOLUS (SEPSIS)
1000.0000 mL | Freq: Once | INTRAVENOUS | Status: AC
  Administered 2024-05-05: 1000 mL via INTRAVENOUS

## 2024-05-05 MED ORDER — SODIUM CHLORIDE 0.9 % IV SOLN: 1.0000 g | Freq: Once | INTRAVENOUS | Status: DC

## 2024-05-05 MED ORDER — CALCIUM GLUCONATE 10 % IV SOLN
1.0000 g | Freq: Once | INTRAVENOUS | Status: DC
  Filled 2024-05-05: qty 10

## 2024-05-05 MED ORDER — CALCIUM CHLORIDE 10 % IV SOLN
1.0000 g | Freq: Once | INTRAVENOUS | Status: AC
  Administered 2024-05-05: 1 g via INTRAVENOUS

## 2024-05-05 MED ORDER — SODIUM CHLORIDE 0.9 % IV SOLN
50.0000 ug/h | INTRAVENOUS | Status: DC
  Administered 2024-05-06 – 2024-05-08 (×8): 50 ug/h via INTRAVENOUS
  Filled 2024-05-05 (×10): qty 1

## 2024-05-05 NOTE — ED Notes (Signed)
 POC occult card resulted as positive.

## 2024-05-05 NOTE — ED Notes (Signed)
 Per provider, emergency consent override for central line placement.
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
## 2024-05-05 NOTE — ED Notes (Signed)
 Pt's bp is 90 systolic manual

## 2024-05-05 NOTE — ED Triage Notes (Addendum)
 Pt arrived via [facility] from home. Pt stopped going to dialysis 3 months ago, because he didn't like it and wants to use weed to treat himself. Pt has AMS and slurred speech started today. It's unknown what time it started. Pt has red blood in stoolx3d. Pt is on eliquis . Pt is alert only. Pt speech is very slurred. EMS gave NS 250 mL

## 2024-05-05 NOTE — ED Notes (Signed)
Zoll placed on pt  

## 2024-05-06 ENCOUNTER — Inpatient Hospital Stay (HOSPITAL_COMMUNITY)

## 2024-05-06 ENCOUNTER — Emergency Department (HOSPITAL_COMMUNITY)

## 2024-05-06 DIAGNOSIS — E875 Hyperkalemia: Secondary | ICD-10-CM | POA: Diagnosis present

## 2024-05-06 DIAGNOSIS — N189 Chronic kidney disease, unspecified: Secondary | ICD-10-CM | POA: Diagnosis not present

## 2024-05-06 DIAGNOSIS — D62 Acute posthemorrhagic anemia: Secondary | ICD-10-CM | POA: Diagnosis not present

## 2024-05-06 DIAGNOSIS — I4891 Unspecified atrial fibrillation: Secondary | ICD-10-CM | POA: Diagnosis not present

## 2024-05-06 DIAGNOSIS — K7460 Unspecified cirrhosis of liver: Secondary | ICD-10-CM | POA: Diagnosis not present

## 2024-05-06 DIAGNOSIS — I5023 Acute on chronic systolic (congestive) heart failure: Secondary | ICD-10-CM | POA: Diagnosis present

## 2024-05-06 DIAGNOSIS — R579 Shock, unspecified: Secondary | ICD-10-CM | POA: Diagnosis not present

## 2024-05-06 DIAGNOSIS — K7200 Acute and subacute hepatic failure without coma: Secondary | ICD-10-CM | POA: Diagnosis present

## 2024-05-06 DIAGNOSIS — T68XXXA Hypothermia, initial encounter: Secondary | ICD-10-CM

## 2024-05-06 DIAGNOSIS — D689 Coagulation defect, unspecified: Secondary | ICD-10-CM | POA: Diagnosis present

## 2024-05-06 DIAGNOSIS — I513 Intracardiac thrombosis, not elsewhere classified: Secondary | ICD-10-CM | POA: Diagnosis not present

## 2024-05-06 DIAGNOSIS — Z7189 Other specified counseling: Secondary | ICD-10-CM | POA: Diagnosis not present

## 2024-05-06 DIAGNOSIS — N1832 Chronic kidney disease, stage 3b: Secondary | ICD-10-CM

## 2024-05-06 DIAGNOSIS — N179 Acute kidney failure, unspecified: Secondary | ICD-10-CM | POA: Diagnosis not present

## 2024-05-06 DIAGNOSIS — R570 Cardiogenic shock: Secondary | ICD-10-CM | POA: Diagnosis present

## 2024-05-06 DIAGNOSIS — R578 Other shock: Secondary | ICD-10-CM | POA: Diagnosis not present

## 2024-05-06 DIAGNOSIS — I428 Other cardiomyopathies: Secondary | ICD-10-CM | POA: Diagnosis present

## 2024-05-06 DIAGNOSIS — D72829 Elevated white blood cell count, unspecified: Secondary | ICD-10-CM

## 2024-05-06 DIAGNOSIS — F319 Bipolar disorder, unspecified: Secondary | ICD-10-CM | POA: Diagnosis present

## 2024-05-06 DIAGNOSIS — J811 Chronic pulmonary edema: Secondary | ICD-10-CM | POA: Diagnosis not present

## 2024-05-06 DIAGNOSIS — J4520 Mild intermittent asthma, uncomplicated: Secondary | ICD-10-CM | POA: Diagnosis present

## 2024-05-06 DIAGNOSIS — K921 Melena: Secondary | ICD-10-CM | POA: Diagnosis present

## 2024-05-06 DIAGNOSIS — E871 Hypo-osmolality and hyponatremia: Secondary | ICD-10-CM | POA: Diagnosis not present

## 2024-05-06 DIAGNOSIS — I8510 Secondary esophageal varices without bleeding: Secondary | ICD-10-CM | POA: Diagnosis present

## 2024-05-06 DIAGNOSIS — K7469 Other cirrhosis of liver: Secondary | ICD-10-CM | POA: Diagnosis not present

## 2024-05-06 DIAGNOSIS — I240 Acute coronary thrombosis not resulting in myocardial infarction: Secondary | ICD-10-CM | POA: Diagnosis present

## 2024-05-06 DIAGNOSIS — Z711 Person with feared health complaint in whom no diagnosis is made: Secondary | ICD-10-CM | POA: Diagnosis not present

## 2024-05-06 DIAGNOSIS — I482 Chronic atrial fibrillation, unspecified: Secondary | ICD-10-CM | POA: Diagnosis present

## 2024-05-06 DIAGNOSIS — G928 Other toxic encephalopathy: Secondary | ICD-10-CM | POA: Diagnosis present

## 2024-05-06 DIAGNOSIS — G9341 Metabolic encephalopathy: Secondary | ICD-10-CM

## 2024-05-06 DIAGNOSIS — A419 Sepsis, unspecified organism: Secondary | ICD-10-CM | POA: Diagnosis present

## 2024-05-06 DIAGNOSIS — R748 Abnormal levels of other serum enzymes: Secondary | ICD-10-CM | POA: Diagnosis not present

## 2024-05-06 DIAGNOSIS — I071 Rheumatic tricuspid insufficiency: Secondary | ICD-10-CM | POA: Diagnosis present

## 2024-05-06 DIAGNOSIS — I130 Hypertensive heart and chronic kidney disease with heart failure and stage 1 through stage 4 chronic kidney disease, or unspecified chronic kidney disease: Secondary | ICD-10-CM | POA: Diagnosis present

## 2024-05-06 DIAGNOSIS — I8501 Esophageal varices with bleeding: Secondary | ICD-10-CM | POA: Diagnosis present

## 2024-05-06 DIAGNOSIS — Z95810 Presence of automatic (implantable) cardiac defibrillator: Secondary | ICD-10-CM | POA: Diagnosis not present

## 2024-05-06 DIAGNOSIS — Z66 Do not resuscitate: Secondary | ICD-10-CM | POA: Diagnosis present

## 2024-05-06 DIAGNOSIS — K625 Hemorrhage of anus and rectum: Secondary | ICD-10-CM

## 2024-05-06 DIAGNOSIS — I4892 Unspecified atrial flutter: Secondary | ICD-10-CM | POA: Diagnosis present

## 2024-05-06 DIAGNOSIS — R609 Edema, unspecified: Secondary | ICD-10-CM | POA: Diagnosis not present

## 2024-05-06 DIAGNOSIS — J9601 Acute respiratory failure with hypoxia: Secondary | ICD-10-CM | POA: Diagnosis present

## 2024-05-06 DIAGNOSIS — R6521 Severe sepsis with septic shock: Secondary | ICD-10-CM | POA: Diagnosis present

## 2024-05-06 DIAGNOSIS — K7290 Hepatic failure, unspecified without coma: Secondary | ICD-10-CM | POA: Diagnosis not present

## 2024-05-06 DIAGNOSIS — Z515 Encounter for palliative care: Secondary | ICD-10-CM | POA: Diagnosis not present

## 2024-05-06 DIAGNOSIS — E872 Acidosis, unspecified: Secondary | ICD-10-CM | POA: Diagnosis not present

## 2024-05-06 DIAGNOSIS — B957 Other staphylococcus as the cause of diseases classified elsewhere: Secondary | ICD-10-CM | POA: Diagnosis present

## 2024-05-06 DIAGNOSIS — Z8719 Personal history of other diseases of the digestive system: Secondary | ICD-10-CM | POA: Diagnosis not present

## 2024-05-06 DIAGNOSIS — N170 Acute kidney failure with tubular necrosis: Secondary | ICD-10-CM | POA: Diagnosis present

## 2024-05-06 DIAGNOSIS — N183 Chronic kidney disease, stage 3 unspecified: Secondary | ICD-10-CM | POA: Diagnosis present

## 2024-05-06 LAB — RENAL FUNCTION PANEL
Albumin: 3.1 g/dL — ABNORMAL LOW (ref 3.5–5.0)
Anion gap: 27 — ABNORMAL HIGH (ref 5–15)
BUN: 42 mg/dL — ABNORMAL HIGH (ref 6–20)
CO2: 12 mmol/L — ABNORMAL LOW (ref 22–32)
Calcium: 9.9 mg/dL (ref 8.9–10.3)
Chloride: 89 mmol/L — ABNORMAL LOW (ref 98–111)
Creatinine, Ser: 4.79 mg/dL — ABNORMAL HIGH (ref 0.61–1.24)
GFR, Estimated: 13 mL/min — ABNORMAL LOW
Glucose, Bld: 105 mg/dL — ABNORMAL HIGH (ref 70–99)
Phosphorus: 5.3 mg/dL — ABNORMAL HIGH (ref 2.5–4.6)
Potassium: 6.3 mmol/L (ref 3.5–5.1)
Sodium: 128 mmol/L — ABNORMAL LOW (ref 135–145)

## 2024-05-06 LAB — BASIC METABOLIC PANEL WITH GFR
Anion gap: 22 — ABNORMAL HIGH (ref 5–15)
Anion gap: 16 — ABNORMAL HIGH (ref 5–15)
Anion gap: 15 (ref 5–15)
Anion gap: 15 (ref 5–15)
BUN: 43 mg/dL — ABNORMAL HIGH (ref 6–20)
BUN: 39 mg/dL — ABNORMAL HIGH (ref 6–20)
BUN: 37 mg/dL — ABNORMAL HIGH (ref 6–20)
BUN: 35 mg/dL — ABNORMAL HIGH (ref 6–20)
CO2: 14 mmol/L — ABNORMAL LOW (ref 22–32)
CO2: 19 mmol/L — ABNORMAL LOW (ref 22–32)
CO2: 20 mmol/L — ABNORMAL LOW (ref 22–32)
CO2: 19 mmol/L — ABNORMAL LOW (ref 22–32)
Calcium: 9.3 mg/dL (ref 8.9–10.3)
Calcium: 9.2 mg/dL (ref 8.9–10.3)
Calcium: 8.9 mg/dL (ref 8.9–10.3)
Calcium: 8.9 mg/dL (ref 8.9–10.3)
Chloride: 91 mmol/L — ABNORMAL LOW (ref 98–111)
Chloride: 94 mmol/L — ABNORMAL LOW (ref 98–111)
Chloride: 95 mmol/L — ABNORMAL LOW (ref 98–111)
Chloride: 96 mmol/L — ABNORMAL LOW (ref 98–111)
Creatinine, Ser: 4.72 mg/dL — ABNORMAL HIGH (ref 0.61–1.24)
Creatinine, Ser: 4.12 mg/dL — ABNORMAL HIGH (ref 0.61–1.24)
Creatinine, Ser: 3.78 mg/dL — ABNORMAL HIGH (ref 0.61–1.24)
Creatinine, Ser: 3.67 mg/dL — ABNORMAL HIGH (ref 0.61–1.24)
GFR, Estimated: 13 mL/min — ABNORMAL LOW
GFR, Estimated: 16 mL/min — ABNORMAL LOW
GFR, Estimated: 17 mL/min — ABNORMAL LOW
GFR, Estimated: 18 mL/min — ABNORMAL LOW
Glucose, Bld: 112 mg/dL — ABNORMAL HIGH (ref 70–99)
Glucose, Bld: 134 mg/dL — ABNORMAL HIGH (ref 70–99)
Glucose, Bld: 117 mg/dL — ABNORMAL HIGH (ref 70–99)
Glucose, Bld: 108 mg/dL — ABNORMAL HIGH (ref 70–99)
Potassium: 6.3 mmol/L (ref 3.5–5.1)
Potassium: 5.2 mmol/L — ABNORMAL HIGH (ref 3.5–5.1)
Potassium: 5.1 mmol/L (ref 3.5–5.1)
Potassium: 5.1 mmol/L (ref 3.5–5.1)
Sodium: 127 mmol/L — ABNORMAL LOW (ref 135–145)
Sodium: 130 mmol/L — ABNORMAL LOW (ref 135–145)
Sodium: 130 mmol/L — ABNORMAL LOW (ref 135–145)
Sodium: 131 mmol/L — ABNORMAL LOW (ref 135–145)

## 2024-05-06 LAB — COMPREHENSIVE METABOLIC PANEL WITH GFR
ALT: 572 U/L — ABNORMAL HIGH (ref 0–44)
AST: 1885 U/L — ABNORMAL HIGH (ref 15–41)
Albumin: 2.6 g/dL — ABNORMAL LOW (ref 3.5–5.0)
Alkaline Phosphatase: 136 U/L — ABNORMAL HIGH (ref 38–126)
Anion gap: 25 — ABNORMAL HIGH (ref 5–15)
BUN: 39 mg/dL — ABNORMAL HIGH (ref 6–20)
CO2: 9 mmol/L — ABNORMAL LOW (ref 22–32)
Calcium: 8.8 mg/dL — ABNORMAL LOW (ref 8.9–10.3)
Chloride: 94 mmol/L — ABNORMAL LOW (ref 98–111)
Creatinine, Ser: 4.53 mg/dL — ABNORMAL HIGH (ref 0.61–1.24)
GFR, Estimated: 14 mL/min — ABNORMAL LOW
Glucose, Bld: <20 mg/dL — CL (ref 70–99)
Potassium: 6.1 mmol/L — ABNORMAL HIGH (ref 3.5–5.1)
Sodium: 128 mmol/L — ABNORMAL LOW (ref 135–145)
Total Bilirubin: 6 mg/dL — ABNORMAL HIGH (ref 0.0–1.2)
Total Protein: 5.1 g/dL — ABNORMAL LOW (ref 6.5–8.1)

## 2024-05-06 LAB — BLOOD CULTURE ID PANEL (REFLEXED) - BCID2

## 2024-05-06 LAB — POCT I-STAT 7, (LYTES, BLD GAS, ICA,H+H)
Acid-base deficit: 9 mmol/L — ABNORMAL HIGH (ref 0.0–2.0)
Bicarbonate: 15.8 mmol/L — ABNORMAL LOW (ref 20.0–28.0)
Calcium, Ion: 1.01 mmol/L — ABNORMAL LOW (ref 1.15–1.40)
HCT: 25 % — ABNORMAL LOW (ref 39.0–52.0)
Hemoglobin: 8.5 g/dL — ABNORMAL LOW (ref 13.0–17.0)
O2 Saturation: 97 %
Patient temperature: 97.5
Potassium: 6.1 mmol/L — ABNORMAL HIGH (ref 3.5–5.1)
Sodium: 127 mmol/L — ABNORMAL LOW (ref 135–145)
TCO2: 17 mmol/L — ABNORMAL LOW (ref 22–32)
pCO2 arterial: 27.1 mmHg — ABNORMAL LOW (ref 32–48)
pH, Arterial: 7.371 (ref 7.35–7.45)
pO2, Arterial: 85 mmHg (ref 83–108)

## 2024-05-06 LAB — LACTIC ACID, PLASMA
Lactic Acid, Venous: 6.5 mmol/L (ref 0.5–1.9)
Lactic Acid, Venous: 5.4 mmol/L (ref 0.5–1.9)
Lactic Acid, Venous: 4.6 mmol/L (ref 0.5–1.9)

## 2024-05-06 LAB — HEPATITIS PANEL, ACUTE
HCV Ab: NONREACTIVE
Hep A IgM: NONREACTIVE
Hep B C IgM: NONREACTIVE
Hepatitis B Surface Ag: NONREACTIVE

## 2024-05-06 LAB — GLUCOSE, CAPILLARY
Glucose-Capillary: 94 mg/dL (ref 70–99)
Glucose-Capillary: 90 mg/dL (ref 70–99)
Glucose-Capillary: 112 mg/dL — ABNORMAL HIGH (ref 70–99)
Glucose-Capillary: 102 mg/dL — ABNORMAL HIGH (ref 70–99)
Glucose-Capillary: 121 mg/dL — ABNORMAL HIGH (ref 70–99)

## 2024-05-06 LAB — PROTIME-INR
INR: 5.9 (ref 0.8–1.2)
Prothrombin Time: 55.3 s — ABNORMAL HIGH (ref 11.4–15.2)

## 2024-05-06 LAB — DIGOXIN LEVEL
Digoxin Level: <0.6 ng/mL — ABNORMAL LOW (ref 0.8–2.0)
Digoxin Level: <0.6 ng/mL — ABNORMAL LOW (ref 0.8–2.0)

## 2024-05-06 LAB — CBC WITH DIFFERENTIAL/PLATELET
Abs Immature Granulocytes: 0.11 K/uL — ABNORMAL HIGH (ref 0.00–0.07)
Basophils Absolute: 0 K/uL (ref 0.0–0.1)
Basophils Relative: 0 %
Eosinophils Absolute: 0 K/uL (ref 0.0–0.5)
Eosinophils Relative: 0 %
HCT: 23.6 % — ABNORMAL LOW (ref 39.0–52.0)
Hemoglobin: 7.2 g/dL — ABNORMAL LOW (ref 13.0–17.0)
Immature Granulocytes: 1 %
Lymphocytes Relative: 4 %
Lymphs Abs: 0.6 K/uL — ABNORMAL LOW (ref 0.7–4.0)
MCH: 24.2 pg — ABNORMAL LOW (ref 26.0–34.0)
MCHC: 30.5 g/dL (ref 30.0–36.0)
MCV: 79.2 fL — ABNORMAL LOW (ref 80.0–100.0)
Monocytes Absolute: 1.4 K/uL — ABNORMAL HIGH (ref 0.1–1.0)
Monocytes Relative: 9 %
Neutro Abs: 13.7 K/uL — ABNORMAL HIGH (ref 1.7–7.7)
Neutrophils Relative %: 86 %
Platelets: 217 K/uL (ref 150–400)
RBC: 2.98 MIL/uL — ABNORMAL LOW (ref 4.22–5.81)
RDW: 25.3 % — ABNORMAL HIGH (ref 11.5–15.5)
WBC: 15.8 K/uL — ABNORMAL HIGH (ref 4.0–10.5)
nRBC: 0.7 % — ABNORMAL HIGH (ref 0.0–0.2)

## 2024-05-06 LAB — CBG MONITORING, ED
Glucose-Capillary: <10 mg/dL — CL (ref 70–99)
Glucose-Capillary: 53 mg/dL — ABNORMAL LOW (ref 70–99)
Glucose-Capillary: 81 mg/dL (ref 70–99)
Glucose-Capillary: 111 mg/dL — ABNORMAL HIGH (ref 70–99)
Glucose-Capillary: 110 mg/dL — ABNORMAL HIGH (ref 70–99)

## 2024-05-06 LAB — ECHOCARDIOGRAM COMPLETE
AR max vel: 1.47 cm2
AV Area VTI: 1.6 cm2
AV Area mean vel: 1.15 cm2
AV Mean grad: 4 mmHg
AV Peak grad: 6.9 mmHg
Ao pk vel: 1.31 m/s
Calc EF: 17.7 %
Est EF: <20
S' Lateral: 5.7 cm
Single Plane A2C EF: 18.7 %
Single Plane A4C EF: 19.1 %
Weight: 3118.19 [oz_av]

## 2024-05-06 LAB — URINALYSIS, W/ REFLEX TO CULTURE (INFECTION SUSPECTED)
Bilirubin Urine: NEGATIVE
Glucose, UA: 150 mg/dL — AB
Ketones, ur: NEGATIVE mg/dL
Nitrite: NEGATIVE
Protein, ur: 100 mg/dL — AB
RBC / HPF: >50 RBC/hpf (ref 0–5)
Specific Gravity, Urine: 1.017 (ref 1.005–1.030)
WBC, UA: >50 WBC/hpf (ref 0–5)
pH: 5 (ref 5.0–8.0)

## 2024-05-06 LAB — I-STAT CHEM 8, ED
BUN: 49 mg/dL — ABNORMAL HIGH (ref 6–20)
Calcium, Ion: 0.92 mmol/L — ABNORMAL LOW (ref 1.15–1.40)
Chloride: 96 mmol/L — ABNORMAL LOW (ref 98–111)
Creatinine, Ser: 5.4 mg/dL — ABNORMAL HIGH (ref 0.61–1.24)
Glucose, Bld: <20 mg/dL — CL (ref 70–99)
HCT: 26 % — ABNORMAL LOW (ref 39.0–52.0)
Hemoglobin: 8.8 g/dL — ABNORMAL LOW (ref 13.0–17.0)
Potassium: 7.2 mmol/L (ref 3.5–5.1)
Sodium: 125 mmol/L — ABNORMAL LOW (ref 135–145)
TCO2: 13 mmol/L — ABNORMAL LOW (ref 22–32)

## 2024-05-06 LAB — HEMOGLOBIN AND HEMATOCRIT, BLOOD
HCT: 22.2 % — ABNORMAL LOW (ref 39.0–52.0)
HCT: 23.1 % — ABNORMAL LOW (ref 39.0–52.0)
HCT: 21.6 % — ABNORMAL LOW (ref 39.0–52.0)
HCT: 22.9 % — ABNORMAL LOW (ref 39.0–52.0)
Hemoglobin: 7.1 g/dL — ABNORMAL LOW (ref 13.0–17.0)
Hemoglobin: 7.4 g/dL — ABNORMAL LOW (ref 13.0–17.0)
Hemoglobin: 7 g/dL — ABNORMAL LOW (ref 13.0–17.0)
Hemoglobin: 7.5 g/dL — ABNORMAL LOW (ref 13.0–17.0)

## 2024-05-06 LAB — I-STAT CG4 LACTIC ACID, ED
Lactic Acid, Venous: 13 mmol/L (ref 0.5–1.9)
Lactic Acid, Venous: 11.1 mmol/L (ref 0.5–1.9)

## 2024-05-06 LAB — MAGNESIUM: Magnesium: 2.3 mg/dL (ref 1.7–2.4)

## 2024-05-06 LAB — HEMOGLOBIN A1C
Hgb A1c MFr Bld: 5.6 % (ref 4.8–5.6)
Mean Plasma Glucose: 114.02 mg/dL

## 2024-05-06 LAB — PRO BRAIN NATRIURETIC PEPTIDE: Pro Brain Natriuretic Peptide: 12378 pg/mL — ABNORMAL HIGH

## 2024-05-06 LAB — MRSA NEXT GEN BY PCR, NASAL: MRSA by PCR Next Gen: NOT DETECTED

## 2024-05-06 LAB — TSH: TSH: 1.29 u[IU]/mL (ref 0.350–4.500)

## 2024-05-06 LAB — PREPARE RBC (CROSSMATCH)

## 2024-05-06 LAB — ACETAMINOPHEN LEVEL: Acetaminophen (Tylenol), Serum: <10 ug/mL — ABNORMAL LOW (ref 10–30)

## 2024-05-06 LAB — AMMONIA: Ammonia: 39 umol/L — ABNORMAL HIGH (ref 9–35)

## 2024-05-06 MED ORDER — TOPIRAMATE 25 MG PO TABS
25.0000 mg | ORAL_TABLET | Freq: Every day | ORAL | Status: DC
  Administered 2024-05-10 – 2024-05-11 (×2): 25 mg via ORAL
  Filled 2024-05-06 (×6): qty 1

## 2024-05-06 MED ORDER — MIDODRINE HCL 5 MG PO TABS: 2.5000 mg | ORAL_TABLET | Freq: Three times a day (TID) | ORAL | Status: DC

## 2024-05-06 MED ORDER — SODIUM CHLORIDE 0.9% IV SOLUTION: Freq: Once | INTRAVENOUS | Status: AC

## 2024-05-06 MED ORDER — VASOPRESSIN 20 UNITS/100 ML INFUSION FOR SHOCK
0.0400 [IU]/min | INTRAVENOUS | Status: DC
  Administered 2024-05-06 – 2024-05-11 (×16): 0.04 [IU]/min via INTRAVENOUS
  Filled 2024-05-06 (×17): qty 100

## 2024-05-06 MED ORDER — SODIUM CHLORIDE 0.9 % IV SOLN: 250.0000 mL | INTRAVENOUS | Status: AC

## 2024-05-06 MED ORDER — SODIUM BICARBONATE 8.4 % IV SOLN
50.0000 meq | Freq: Once | INTRAVENOUS | Status: AC
  Administered 2024-05-06: 50 meq via INTRAVENOUS
  Filled 2024-05-06: qty 50

## 2024-05-06 MED ORDER — PRISMASOL BGK 2/3.5 32-2-3.5 MEQ/L EC SOLN: Status: DC

## 2024-05-06 MED ORDER — DEXTROSE-SODIUM CHLORIDE 5-0.9 % IV SOLN: INTRAVENOUS | Status: AC

## 2024-05-06 MED ORDER — CALCIUM GLUCONATE 10 % IV SOLN
1.0000 g | Freq: Once | INTRAVENOUS | Status: AC
  Administered 2024-05-06: 1 g via INTRAVENOUS
  Filled 2024-05-06 (×3): qty 10

## 2024-05-06 MED ORDER — CHLORHEXIDINE GLUCONATE CLOTH 2 % EX PADS
6.0000 | MEDICATED_PAD | Freq: Every day | CUTANEOUS | Status: DC
  Administered 2024-05-06 – 2024-05-08 (×4): 6 via TOPICAL

## 2024-05-06 MED ORDER — INSULIN ASPART 100 UNIT/ML IV SOLN
5.0000 [IU] | Freq: Once | INTRAVENOUS | Status: AC
  Administered 2024-05-06: 5 [IU] via INTRAVENOUS
  Filled 2024-05-06: qty 5

## 2024-05-06 MED ORDER — DEXTROSE 50 % IV SOLN
1.0000 | Freq: Once | INTRAVENOUS | Status: AC
  Administered 2024-05-06: 50 mL via INTRAVENOUS
  Filled 2024-05-06: qty 50

## 2024-05-06 MED ORDER — PANTOPRAZOLE SODIUM 40 MG IV SOLR
40.0000 mg | Freq: Two times a day (BID) | INTRAVENOUS | Status: DC
  Administered 2024-05-06 – 2024-05-07 (×3): 40 mg via INTRAVENOUS
  Filled 2024-05-06 (×3): qty 10

## 2024-05-06 MED ORDER — DEXTROSE 50 % IV SOLN: 1.0000 | Freq: Once | INTRAVENOUS | Status: DC

## 2024-05-06 MED ORDER — DEXMEDETOMIDINE HCL IN NACL 400 MCG/100ML IV SOLN
0.0000 ug/kg/h | INTRAVENOUS | Status: DC
  Administered 2024-05-06: 0.4 ug/kg/h via INTRAVENOUS
  Administered 2024-05-07: 0.5 ug/kg/h via INTRAVENOUS
  Administered 2024-05-07: 0.4 ug/kg/h via INTRAVENOUS
  Administered 2024-05-08: 0.8 ug/kg/h via INTRAVENOUS
  Administered 2024-05-08: 0.4 ug/kg/h via INTRAVENOUS
  Administered 2024-05-09: 0.2 ug/kg/h via INTRAVENOUS
  Administered 2024-05-09 (×3): 1 ug/kg/h via INTRAVENOUS
  Administered 2024-05-09: 0.5 ug/kg/h via INTRAVENOUS
  Administered 2024-05-10: 1 ug/kg/h via INTRAVENOUS
  Administered 2024-05-10: 0.6 ug/kg/h via INTRAVENOUS
  Administered 2024-05-10: 0.4 ug/kg/h via INTRAVENOUS
  Administered 2024-05-10 – 2024-05-11 (×3): 1.2 ug/kg/h via INTRAVENOUS
  Administered 2024-05-11: 1 ug/kg/h via INTRAVENOUS
  Administered 2024-05-11: 0.9 ug/kg/h via INTRAVENOUS
  Filled 2024-05-06 (×18): qty 100

## 2024-05-06 MED ORDER — SODIUM CHLORIDE 0.9 % IV SOLN
2.0000 g | Freq: Two times a day (BID) | INTRAVENOUS | Status: DC
  Administered 2024-05-06 – 2024-05-11 (×11): 2 g via INTRAVENOUS
  Filled 2024-05-06 (×11): qty 12.5

## 2024-05-06 MED ORDER — NOREPINEPHRINE 16 MG/250ML-% IV SOLN
0.0000 ug/min | INTRAVENOUS | Status: DC
  Administered 2024-05-06: 18 ug/min via INTRAVENOUS
  Administered 2024-05-07: 17 ug/min via INTRAVENOUS
  Administered 2024-05-08: 22 ug/min via INTRAVENOUS
  Administered 2024-05-08: 18 ug/min via INTRAVENOUS
  Administered 2024-05-08: 19 ug/min via INTRAVENOUS
  Administered 2024-05-09: 22 ug/min via INTRAVENOUS
  Administered 2024-05-09 – 2024-05-11 (×7): 40 ug/min via INTRAVENOUS
  Filled 2024-05-06 (×13): qty 250

## 2024-05-06 MED ORDER — INSULIN ASPART 100 UNIT/ML IJ SOLN: 0.0000 [IU] | INTRAMUSCULAR | Status: DC

## 2024-05-06 MED ORDER — DEXTROSE 5 % IV SOLN
12.5000 mg/kg/h | INTRAVENOUS | Status: DC
  Administered 2024-05-06: 12.5 mg/kg/h via INTRAVENOUS
  Filled 2024-05-06: qty 90

## 2024-05-06 MED ORDER — SODIUM ZIRCONIUM CYCLOSILICATE 10 G PO PACK: 10.0000 g | PACK | Freq: Once | ORAL | Status: DC

## 2024-05-06 MED ORDER — ORAL CARE MOUTH RINSE: 15.0000 mL | OROMUCOSAL | Status: DC | PRN

## 2024-05-06 MED ORDER — VANCOMYCIN HCL 750 MG/150ML IV SOLN
750.0000 mg | INTRAVENOUS | Status: DC
  Administered 2024-05-07: 750 mg via INTRAVENOUS
  Filled 2024-05-06: qty 150

## 2024-05-06 MED ORDER — CALCIUM GLUCONATE 10 % IV SOLN
1.0000 g | Freq: Once | INTRAVENOUS | Status: AC
  Administered 2024-05-06: 1 g via INTRAVENOUS
  Filled 2024-05-06: qty 10

## 2024-05-06 MED ORDER — ACETYLCYSTEINE LOAD VIA INFUSION
150.0000 mg/kg | Freq: Once | INTRAVENOUS | Status: AC
  Administered 2024-05-06: 13260 mg via INTRAVENOUS
  Filled 2024-05-06: qty 435

## 2024-05-06 MED ORDER — PROTHROMBIN COMPLEX CONC HUMAN 500 UNITS IV KIT
2276.0000 [IU] | PACK | Status: AC
  Administered 2024-05-06: 2276 [IU] via INTRAVENOUS
  Filled 2024-05-06: qty 2276

## 2024-05-06 MED ORDER — ONDANSETRON HCL 4 MG/2ML IJ SOLN: 4.0000 mg | Freq: Four times a day (QID) | INTRAMUSCULAR | Status: DC | PRN

## 2024-05-06 MED ORDER — NOREPINEPHRINE 4 MG/250ML-% IV SOLN
0.0000 ug/min | INTRAVENOUS | Status: DC
  Administered 2024-05-06: 2 ug/min via INTRAVENOUS
  Administered 2024-05-06: 20 ug/min via INTRAVENOUS
  Filled 2024-05-06: qty 250

## 2024-05-06 MED ORDER — SODIUM CHLORIDE 0.9% FLUSH: 10.0000 mL | INTRAVENOUS | Status: DC | PRN

## 2024-05-06 MED ORDER — INSULIN ASPART 100 UNIT/ML IV SOLN: 10.0000 [IU] | Freq: Once | INTRAVENOUS | Status: DC

## 2024-05-06 MED ORDER — NOREPINEPHRINE 4 MG/250ML-% IV SOLN
0.0000 ug/min | INTRAVENOUS | Status: DC
  Administered 2024-05-06: 5 ug/min via INTRAVENOUS
  Filled 2024-05-06: qty 250

## 2024-05-06 MED ORDER — METRONIDAZOLE 500 MG/100ML IV SOLN
500.0000 mg | Freq: Two times a day (BID) | INTRAVENOUS | Status: DC
  Administered 2024-05-06 – 2024-05-11 (×11): 500 mg via INTRAVENOUS
  Filled 2024-05-06 (×11): qty 100

## 2024-05-06 MED ORDER — PROTHROMBIN COMPLEX CONC HUMAN 500 UNITS IV KIT: 50.0000 [IU]/kg | PACK | Status: DC

## 2024-05-06 MED ORDER — HEPARIN SODIUM (PORCINE) 1000 UNIT/ML DIALYSIS
1000.0000 [IU] | INTRAMUSCULAR | Status: DC | PRN
  Administered 2024-05-09: 2800 [IU] via INTRAVENOUS_CENTRAL
  Filled 2024-05-06: qty 4
  Filled 2024-05-06: qty 6

## 2024-05-06 MED ORDER — DEXTROSE 5 % IV SOLN
6.2500 mg/kg/h | INTRAVENOUS | Status: DC
  Administered 2024-05-07 – 2024-05-08 (×3): 6.25 mg/kg/h via INTRAVENOUS
  Filled 2024-05-06 (×3): qty 90

## 2024-05-06 MED ORDER — VITAMIN K1 10 MG/ML IJ SOLN
10.0000 mg | Freq: Once | INTRAVENOUS | Status: AC
  Administered 2024-05-06: 10 mg via INTRAVENOUS
  Filled 2024-05-06: qty 1

## 2024-05-06 MED ORDER — VANCOMYCIN HCL 1500 MG/300ML IV SOLN
1500.0000 mg | Freq: Once | INTRAVENOUS | Status: AC
  Administered 2024-05-06: 1500 mg via INTRAVENOUS
  Filled 2024-05-06: qty 300

## 2024-05-06 NOTE — Progress Notes (Addendum)
 "  NAMEAshwin Guzman, MRN:  [id number], DOB:  08-20-1965, LOS: 0 ADMISSION DATE:  05/05/2024, CONSULTATION DATE:  05/06/24 REFERRING MD:  Nancyann Kin, MD, CHIEF COMPLAINT:  rectal bleed  History of Present Illness:  History obtained via chart review patient is encephalopathic   59  year old male history of chronic HFrEF s/p ICD, HTN, BPAD, history of GI bleed, history of esophageal varices, and CKD stage III who presented to the ED with progressive altered mental status and 3 days of blood in stool. Initial evaluation was concerning for wide complex tachyarrhythmia and he was given calcium . He was also found to be hypothermic, hypoglycemic and hypotensive with acute renal failure. He was started on levophed and a trialysis line was placed.   Pertinent  Medical History  CAD, Cardiomyopathy s/p defibrillator, CKD, HTN, neuropathy, Atrial fib/flutter, Anemia, CHF, RBBB  Significant Hospital Events: Including procedures, antibiotic start and stop dates in addition to other pertinent events   1/2: admit to ICU on NE/vaso, CRRT started  Interim History / Subjective:  Patient with increasing levophed requirements in ED. Arterial line placed. Transferred to ICU and started on CRRT. Confused on exam, no specific complaints.   Objective    Blood pressure (!) 88/60, pulse 86, temperature (!) 97.5 F (36.4 C), temperature source Oral, resp. rate (!) 26, SpO2 97%.        Intake/Output Summary (Last 24 hours) at 05/06/2024 0944 Last data filed at 05/06/2024 0229 Gross per 24 hour  Intake 1010.76 ml  Output --  Net 1010.76 ml   There were no vitals filed for this visit.  Examination: General: confused, no acute distress HENT: [place]/AT, sclera anicteric Lungs: breathing comfortably on room air, diminished bilateral bases Cardiovascular: regular rate and rhythm, normal S1 and S2, no m/r/g Abdomen: soft, non-tender, non-distended, +BS Extremities: warm, dry, dependent edema  Neuro: oriented  to person only, moves all extremities spontaneously  GU: deferred   Resolved problem list   Assessment and Plan   Undifferentiated shock; hemorrhagic +/- cardiogenic and cannot rule out sepsis Acute on chronic systolic heart failure s/p ICD; TTE 01/2024 EF 25-30%, moderate RV, biatrial severe dilation, moderate MR, moderate-severe TR Lactic acidosis Leukocytosis  - Continue levophed and vasopressin to maintain MAP>65 - Judicious IVF resuscitation given history of HFrEF  - Send UA. Follow-up cultures, continue vanc/cefepime/flagyl - May need additional central access and would check Co-ox  - Management of LGIB per below  - Order Echo   - Trend lactate   Acute metabolic encephalopathy, in the setting of acute renal and liver dysfunction; CTH with no acute abnormality, ammonia 39 - Continue to monitor neuro status  History of atrial fibrillation/flutter; currently V-paced on tele  Wide complex tachyarrhythmia; likely in the setting of hyperkalemia, resolved  - Unclear if he is on Eliquis  at home, pharmacy to confirm with family - Holding [facility] for now in the setting of acute bleed and coagulopathy   LGIB; presented with reports of blood tinged stool for 3 days and Hgb 7.2 from 9.1 in 03/2024, fecal occult positive  History of esophageal varices Acute blood loss anemia Elevated INR; in the setting of acute liver failure - Transfuse 1 PRBC - Continue to transfuse as needed for Hgb goal>7 or hemodynamically significant bleeding  - Kcentra and vitamin K for correction of coagulopathy  - Continue octreotide  AKI on CKD stage III Hyperkalemia  Hyponatremia - Nephro consulted, appreciate recommendations - Continue CRRT  - q4h BMP - Continue foley,  monitor UOP  - Avoid nephrotoxins, renally dose medications, ensure adequate renal perfusion   Shock liver Elevated Tbili - RUQ US  with doppler  - Continue to trend CMP - Check hepatitis panel   Labs   CBC: Recent Labs  Lab  05/06/24 0059 05/06/24 0250  WBC  --  15.8*  NEUTROABS  --  13.7*  HGB 8.8* 7.2*  HCT 26.0* 23.6*  MCV  --  79.2*  PLT  --  217    Basic Metabolic Panel: Recent Labs  Lab 05/06/24 0053 05/06/24 0059 05/06/24 0433  NA 128* 125* 128*  K 6.1* 7.2* 6.3*  CL 94* 96* 89*  CO2 9*  --  12*  GLUCOSE <20* <20* 105*  BUN 39* 49* 42*  CREATININE 4.53* 5.40* 4.79*  CALCIUM  8.8*  --  9.9  MG  --   --  2.3  PHOS  --   --  5.3*   GFR: CrCl cannot be calculated (Unknown ideal weight.). Recent Labs  Lab 05/06/24 0059 05/06/24 0250 05/06/24 0647  WBC  --  15.8*  --   LATICACIDVEN 13.0*  --  11.1*    Liver Function Tests: Recent Labs  Lab 05/06/24 0053 05/06/24 0433  AST 1,885*  --   ALT 572*  --   ALKPHOS 136*  --   BILITOT 6.0*  --   PROT 5.1*  --   ALBUMIN 2.6* 3.1*   No results for input(s): LIPASE, AMYLASE in the last 168 hours. Recent Labs  Lab 05/06/24 0053  AMMONIA 39*    ABG    Component Value Date/Time   HCO3 29.6 (H) 01/19/2024 1015   HCO3 29.1 (H) 01/19/2024 1015   TCO2 13 (L) 05/06/2024 0059   ACIDBASEDEF 2.6 (H) 01/13/2024 0019   O2SAT 71.9 01/21/2024 0634     Coagulation Profile: Recent Labs  Lab 05/06/24 0053  INR 5.9*    Cardiac Enzymes: No results for input(s): CKTOTAL, CKMB, CKMBINDEX, TROPONINI in the last 168 hours.  HbA1C: Hgb A1c MFr Bld  Date/Time Value Ref Range Status  05/06/2024 02:50 AM 5.6 4.8 - 5.6 % Final    Comment:    Repeated to verify  (NOTE) Diagnosis of Diabetes The following HbA1c ranges recommended by the American Diabetes Association (ADA) may be used as an aid in the diagnosis of diabetes mellitus.  Hemoglobin             Suggested A1C NGSP%              Diagnosis  <5.7                   Non Diabetic  5.7-6.4                Pre-Diabetic  >6.4                   Diabetic  <7.0                   Glycemic control for                       adults with diabetes.    08/27/2020 11:50 AM 5.9  (H) 4.8 - 5.6 % Final    Comment:    (NOTE) Pre diabetes:          5.7%-6.4%  Diabetes:              >6.4%  Glycemic control for   <7.0% adults  with diabetes     CBG: Recent Labs  Lab 05/06/24 0124 05/06/24 0137 05/06/24 0218 05/06/24 0430 05/06/24 0604  GLUCAP <10* 81 53* 111* 110*    Critical care time: 50   The patient is critically ill with multiple organ system failure and requires high complexity decision making for assessment and support, frequent evaluation and titration of therapies, advanced monitoring, review of radiographic studies and interpretation of complex data.   Critical Care Time devoted to patient care services, exclusive of separately billable procedures, described in this note is 50 minutes.  Rexene LOISE Blush, PA-C [facility] 05/06/2024 1:13 PM  Please see [email or web address] for pager details.  From 7A-7P if no response, please call [phone] After hours, please call ELink [phone]          "

## 2024-05-06 NOTE — Progress Notes (Signed)
 Initial Nutrition Assessment  DOCUMENTATION CODES:  Not applicable  INTERVENTION:  Diet advancement per CCM and as medically ready.   If enteral nutrition indicated over the weekend, recommend bedside NGT placement by nursing.   If tube feeds indicated during admission, recommend: Vital 1.5 with goal rate of 31ml/hr ( per day) *start at 15ml and advance by 10ml q10h to goal rate as tolerated 60ml ProSource TF20 BID TF at goal provides 2140 kcal, 119g protein, free water daily  Will need to monitor for refeeding labs pending resumption of nutritional intake. Consider addition of thiamine 100mg  daily.   Recommend Renal MVI with minerals once access established or diet advanced  NUTRITION DIAGNOSIS:  Inadequate oral intake related to acute illness as evidenced by NPO status.  GOAL:  Patient will meet greater than or equal to 90% of their needs  MONITOR:  Diet advancement, Labs, Weight trends, I & O's  REASON FOR ASSESSMENT:  Consult Other (Comment) (CRRT)  ASSESSMENT:   Pt admitted with progressive AMS and c/o rectal bleeding.  PMH significant for chronic HFrEF s/p ICD, HTN, GI bleed, esophageal varices and CKD stage III.  On admission, pt found to have hypothermia, hypoglycemia and hypotension with acute renal failure. Started on pressor support and trialysis line placed for CRRT initiation.   Patient very lethargic and does not stay awake for nutrition assessment. He remains NPO.   Discussed placement of Cortrak for medication administration as pt is not medically appropriate for nutrition support d/t current medical instability. Plan to defer placement of enteral access at this time given h/o varices and INR of 5.9.  Patient is at elevated risk for refeeding given acute presentation and suspected poor oral intake. If enteral access is obtained and nutrition support initiated or if diet is advanced, will need to monitor electrolytes for refeeding syndrome.    I/O's: +2141ml since admit CRRT x8 hours Net even to positive fluid management per Nephrology.   Medications: SSI 0-6 units q4h (not given), octreotide infusion Drips: D5 @ 40ml/hr Levo @ 14 mcg/min Abx Vaso @ 0.04 units/min  Labs:  Sodium 127 Potassium 6.1 Chloride 91 BUN 43 Cr 4.72 Anion gap 22 Ionized calcium  1.01 Phosphorus 5.3 Alkaline phosphatase 136 Albumin 3.1 AST 1885 ALT 572 Ammonia 39 Total bilirubin 6.0 GFR 13 Lactic acid 11.1 CBG's 53-111 x24 hours (<10 on admission) HgbA1c 5.6% (1/2)  NUTRITION - FOCUSED PHYSICAL EXAM:  Flowsheet Row Most Recent Value  Orbital Region No depletion  Upper Arm Region No depletion  Thoracic and Lumbar Region No depletion  Buccal Region No depletion  Temple Region No depletion  Clavicle Bone Region No depletion  Clavicle and Acromion Bone Region No depletion  Scapular Bone Region No depletion  Dorsal Hand No depletion  Patellar Region No depletion  Anterior Thigh Region No depletion  Posterior Calf Region No depletion  Edema (RD Assessment) Mild  [generalized edema]  Hair Reviewed  Eyes Unable to assess  Mouth Unable to assess  Skin Reviewed  Nails Reviewed    Diet Order:   Diet Order             Diet NPO time specified  Diet effective now                   EDUCATION NEEDS:   No education needs have been identified at this time  Skin:  Skin Assessment: Reviewed RN Assessment  Last BM:  unknown/PTA  Height:   Ht Readings from Last 1 Encounters:  03/14/24 5' 8 (1.727 m)    Weight:   Wt Readings from Last 1 Encounters:  05/06/24 88.4 kg    Ideal Body Weight:  70 kg  BMI:  Body mass index is 29.63 kg/m.  Estimated Nutritional Needs:   Kcal:  2000-2200  Protein:  115-130g  Fluid:  1L + UOP  Royce Maris, RDN, LDN Clinical Nutrition See AMiON for contact information.
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
## 2024-05-06 NOTE — Procedures (Signed)
 Arterial Catheter Insertion Procedure Note  Cody Guzman  [id number]  [birth year]  Date:05/06/2024  Time:9:32 AM    Provider Performing: Rexene LOISE Blush    Procedure: Insertion of Arterial Line (63379) with US  guidance (23062)   Indication(s) Blood pressure monitoring and/or need for frequent ABGs  Consent Unable to obtain consent due to emergent nature of procedure.  Anesthesia None   Time Out Verified patient identification, verified procedure, site/side was marked, verified correct patient position, special equipment/implants available, medications/allergies/relevant history reviewed, required imaging and test results available.   Sterile Technique Maximal sterile technique including full sterile barrier drape, hand hygiene, sterile gown, sterile gloves, mask, hair covering, sterile ultrasound probe cover (if used).   Procedure Description Area of catheter insertion was cleaned with chlorhexidine  and draped in sterile fashion. With real-time ultrasound guidance an arterial catheter was placed into the right radial artery.  Appropriate arterial tracings confirmed on monitor.     Complications/Tolerance None; patient tolerated the procedure well.   EBL Minimal   Specimen(s) None  Rexene LOISE Blush, [place] [facility] [facility] 05/06/2024 9:32 AM  Please see [email or web address] for pager details.  From 7A-7P if no response, please call [phone] After hours, please call ELink [phone]

## 2024-05-06 NOTE — ED Notes (Signed)
 Phlebotomy unable to collect blood culture. RN aware
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
## 2024-05-06 NOTE — Progress Notes (Signed)
 Pharmacy Antibiotic Note  Cody Guzman is a 59 y.o. male admitted on 05/05/2024 with sepsis.  Pharmacy has been consulted for Vancomycin and Cefepime dosing.  Plan: Cefepime 2gm IV q12h Vancomycin 1500mg  IV now then 750mg  IV Q 24 hrs. Will f/u CRRT tolerance, micro data, and pt's clinical condition Vanc levels prn      Temp (24hrs), Avg:96.3 F (35.7 C), Min:93.3 F (34.1 C), Max:98 F (36.7 C)  Recent Labs  Lab 05/06/24 0053 05/06/24 0059 05/06/24 0250 05/06/24 0433 05/06/24 0647  WBC  --   --  15.8*  --   --   CREATININE 4.53* 5.40*  --  4.79*  --   LATICACIDVEN  --  13.0*  --   --  11.1*    CrCl cannot be calculated (Unknown ideal weight.).    Allergies[1]  Antimicrobials this admission: 1/2 Vanc >>  1/2 Cefepime >>   Microbiology results: 1/1 BCx:  1/2 MRSA PCR: negative  Thank you for allowing pharmacy to be a part of this patients care.  Vito Ralph, PharmD, BCPS Please see amion for complete clinical pharmacist phone list 05/06/2024 10:05 AM     [1] No Known Allergies

## 2024-05-06 NOTE — Procedures (Signed)
 Central Venous Catheter Insertion Procedure Note  Cody Guzman  [id number]  06/04/1965  Date:05/06/2024  Time:2:33 AM   Provider Performing:Cody Guzman   Procedure: Insertion of Non-tunneled Central Venous Catheter(36556)with US  guidance (23062)    Indication(s): Medication administration, Difficult access, and Hemodialysis  Consent: Unable to obtain consent due to emergent nature of procedure.  Anesthesia: Topical only with 1% lidocaine    Timeout: Verified patient identification, verified procedure, site/side was marked, verified correct patient position, special equipment/implants available, medications/allergies/relevant history reviewed, required imaging and test results available.  Sterile Technique: Maximal sterile technique including full sterile barrier drape, hand hygiene, sterile gown, sterile gloves, mask, hair covering, sterile ultrasound probe cover (if used).  Procedure Description: Area of catheter insertion was cleaned with chlorhexidine  and draped in sterile fashion.   With real-time ultrasound guidance a HD catheter was placed into the left femoral vein.  Nonpulsatile blood flow and easy flushing noted in all ports.  The catheter was sutured in place and sterile dressing applied.    Complications/Tolerance: None; patient tolerated the procedure well. Chest X-ray is ordered to verify placement for internal jugular or subclavian cannulation.  Chest x-ray is not ordered for femoral cannulation.  EBL: Minimal  Specimen(s): None  Cody Guzman, [place] [facility] 05/06/2024 2:34 AM  Please see [email or web address] for pager details.  From 7A-7P if no response, please call [phone] After hours, please call ELink [phone]

## 2024-05-06 NOTE — Progress Notes (Signed)
 PCCM Progress Note  Echo reviewed with LV thrombus noted, EF<20%, global LV hypokinesis, mild MR, severe TR, IVC dilated with <50% respiratory variability. Holding [facility] for LV thrombus given concern for GI bleeding. Will need to anticoagulate when Hgb stable and no s/s bleeding. If worsens consider dobutamine, PICC versus additional CVL placement and to trend Co-ox and CVP. May also need to pull some fluid.   1 PRBC from this morning not given. Hgb 7.0. Will give 1 PRBC.   Rexene LOISE Blush, PA-C [place] [facility] 05/06/2024 6:05 PM  Please see [email or web address] for pager details.  From 7A-7P if no response, please call [phone] After hours, please call ELink [phone]

## 2024-05-06 NOTE — Progress Notes (Addendum)
 Critical care attending attestation note:   I agree with the Advanced Practitioner's note, impression, and recommendations as outlined. I have taken an independent interval history, reviewed the chart and examined the patient. The following reflects my medical decision making and independent critical care time    Summary of Assessment and Plan   59 year old male with chronic HFrEF status post ICD, hypertension, history of GI bleed and esophageal varices, CKD stage III presents with altered mental status and blood in stools.  He had wide-complex tachycardia on presentation.  He was found to be hypothermic, hypoglycemic, hypotensive and in acute renal failure along with hyperkalemia.  Trialysis line was placed and he was started on CRRT. On my evaluation in the ED prior to initiation of CRRT patient was confused.  He had elevated potassium.  Since timing of CRRT was not determined we did start him on Lokelma and insulin and D50.  Pertinent Physical Exam:  General: Ill-appearing 59 year old male. Lungs: clear to auscultation bilaterally.  Heart: regular rate rhythm, no murmur appreciated.  Abdomen nontender distended. Neuro: Alert but confused.  Moving all extremities.  Labs and Radiology reviewed. Bedside POCUS with decreased ejection fraction with dilated RV.  No pericardial effusion.  IVC could not be visualized. Labs: Elevated white cell count 15.8, potassium 6.3, lactic acid 13, creatinine 4.8, INR 5.9, LFTs 572/1885/136 with elevated bilirubin.  A/P:  Shock: Likely septic versus secondary to acidosis: AKI on CKD: Lactic acidosis: History of A-fib/flutter, wide-complex tachycardia: Hyperkalemia: Acute metabolic encephalopathy: - Holding amiodarone  and digoxin . - Broad-spectrum antibiotics with bank cefepime and Flagyl. - CT head negative. - Lactic acidosis improving. - On CRRT. - Unclear cause of AKI on CKD.  Unclear cause of lactic acidosis.  Covering for septic shock.  Acute  liver failure: Elevated INR: - Likely related to shock liver.  Rule out other causes. - Tylenol  levels, toxicology, liver ultrasound, hepatitis panel. - Status post vitamin K. - GI consult. - Hold amiodarone . - NAC.   LGI bleed: - Status post 1 unit PRBC. - Status post Kcentra and vitamin K. - IV PPI twice daily. - Started on octreotide with history of varices.  Biventricular failure: LV thrombus: - Cannot anticoagulate patient. - Will need to interrogate his ICD/pacemaker. - Holding cardiac meds as above.  The patient is a high risk for intubation.    CRITICAL CARE Performed by: Sammi JONETTA Fredericks.     Total critical care time: 80 minutes   Critical care time was exclusive of separately billable procedures and treating other patients.   Critical care was necessary to treat or prevent imminent or life-threatening deterioration.   Critical care was time spent personally by me on the following activities: development of treatment plan with patient and/or surrogate as well as nursing, discussions with consultants, evaluation of patient's response to treatment, examination of patient, obtaining history from patient or surrogate, ordering and performing treatments and interventions, ordering and review of laboratory studies, ordering and review of radiographic studies, pulse oximetry, re-evaluation of patient's condition and participation in multidisciplinary rounds.  Sammi JONETTA Fredericks, MD Pulmonary, Critical Care and Sleep Attending.  Pager: [phone]  05/06/2024, 6:01 PM

## 2024-05-06 NOTE — ED Notes (Signed)
 Phlebotomy attempted to collect second set of blood cultures, unsuccessful.

## 2024-05-06 NOTE — ED Notes (Signed)
 CBG <10 Dr Midge aware

## 2024-05-06 NOTE — TOC CM/SW Note (Signed)
 Transition of Care [facility]) - Inpatient Brief Assessment   Patient Details  Name: Cody Guzman MRN: [id number] Date of Birth: Sep 30, 1965  [facility] of [facility]) CM/SW Contact:    Tom-Johnson, Harvest Muskrat, RN Phone Number: 05/06/2024, 3:30 PM   Clinical Narrative:  Patient presented to the ED with Altered Mental Status and Blood in Stool. Patient was found to be Hypothermic, Hypoglycemic and Hypotensive. Patien was started on Levophed and a Trialysis Line was placed. Admitted with Acute Renal Failure.  Patient has hx of A-Fib, A-Flutter on Eliquis , neuropathy, Anemia, RBBB, CHF, Cardiomyopathy s/p Defibrillator, CKD. Started on CRRT today 05/06/24. Nephrology following. On IV abx, Levo, Vasopressin. On 2L O2.   CM went to assess patient at bedside, unable to assess, patient noted to be pleasantly confused with minimal response.  Patient not Medically ready for discharge.  CM will continue to follow and assess as patient progresses with care towards discharge.             Transition of Care Asessment:

## 2024-05-06 NOTE — Progress Notes (Signed)
 Notified Rexene Blush, PA in re to La 6.5 and K 5.2.   Also notified of Ectopies pt has had recently  Pt tolerating well CRRT. Still restless with intermittent confusion

## 2024-05-06 NOTE — Progress Notes (Signed)
 PHARMACY - PHYSICIAN COMMUNICATION CRITICAL VALUE ALERT - BLOOD CULTURE IDENTIFICATION (BCID)  Cody Guzman is an 60 y.o. male who presented to [facility] on 05/05/2024 with a chief complaint of AMS  Assessment:  1/3 blood cx with staph epi  Name of physician (or Provider) ContactedBETHA Staff, MD  Current antibiotics: vancomycin, cefepime, flagyl  Changes to prescribed antibiotics recommended:  None, likely contaminant  Results for orders placed or performed during the hospital encounter of 05/05/24  Blood Culture ID Panel (Reflexed) (Collected: 05/05/2024  1:21 AM)  Result Value Ref Range   Enterococcus faecalis NOT DETECTED NOT DETECTED   Enterococcus Faecium NOT DETECTED NOT DETECTED   Listeria monocytogenes NOT DETECTED NOT DETECTED   Staphylococcus species DETECTED (A) NOT DETECTED   Staphylococcus aureus (BCID) NOT DETECTED NOT DETECTED   Staphylococcus epidermidis DETECTED (A) NOT DETECTED   Staphylococcus lugdunensis NOT DETECTED NOT DETECTED   Streptococcus species NOT DETECTED NOT DETECTED   Streptococcus agalactiae NOT DETECTED NOT DETECTED   Streptococcus pneumoniae NOT DETECTED NOT DETECTED   Streptococcus pyogenes NOT DETECTED NOT DETECTED   A.calcoaceticus-baumannii NOT DETECTED NOT DETECTED   Bacteroides fragilis NOT DETECTED NOT DETECTED   Enterobacterales NOT DETECTED NOT DETECTED   Enterobacter cloacae complex NOT DETECTED NOT DETECTED   Escherichia coli NOT DETECTED NOT DETECTED   Klebsiella aerogenes NOT DETECTED NOT DETECTED   Klebsiella oxytoca NOT DETECTED NOT DETECTED   Klebsiella pneumoniae NOT DETECTED NOT DETECTED   Proteus species NOT DETECTED NOT DETECTED   Salmonella species NOT DETECTED NOT DETECTED   Serratia marcescens NOT DETECTED NOT DETECTED   Haemophilus influenzae NOT DETECTED NOT DETECTED   Neisseria meningitidis NOT DETECTED NOT DETECTED   Pseudomonas aeruginosa NOT DETECTED NOT DETECTED   Stenotrophomonas maltophilia NOT DETECTED  NOT DETECTED   Candida albicans NOT DETECTED NOT DETECTED   Candida auris NOT DETECTED NOT DETECTED   Candida glabrata NOT DETECTED NOT DETECTED   Candida krusei NOT DETECTED NOT DETECTED   Candida parapsilosis NOT DETECTED NOT DETECTED   Candida tropicalis NOT DETECTED NOT DETECTED   Cryptococcus neoformans/gattii NOT DETECTED NOT DETECTED   Methicillin resistance mecA/C DETECTED (A) NOT DETECTED    Harlene Denna Berdine JONETTA, BCPS, BCCP Clinical Pharmacist  05/06/2024 11:17 PM   [facility] pharmacy phone numbers are listed on [email or web address]

## 2024-05-06 NOTE — Progress Notes (Signed)
 eLink Physician-Brief Progress Note Patient Name: Cody Guzman DOB: [birth year] MRN: [id number]   Date of Service  05/06/2024  HPI/Events of Note  1/3 BC (+) Staph Epi Likely contaminant. On broad spectrum abx  eICU Interventions  No de-escalation at this time with current pressor requirements     Intervention Category Intermediate Interventions: Infection - evaluation and management  Charisse Wendell Slater Staff 05/06/2024, 11:19 PM

## 2024-05-06 NOTE — Progress Notes (Signed)
 eLink Physician-Brief Progress Note Patient Name: Cody Guzman DOB: September 01, 1965 MRN: [id number]   Date of Service  05/06/2024  HPI/Events of Note  Patient agitated and restless including rolling around in bed. Unclear if in pain due to intermittent episodes of appearing comfortable before becoming very restless again. Encephalopathy is likely driving this  Recently had vas cath placed and on CRRT On vasopressors  eICU Interventions  Precedex ordered  If remains agitated may need restraints     Intervention Category Minor Interventions: Agitation / anxiety - evaluation and management  Jenee Spaugh Slater Staff 05/06/2024, 9:10 PM

## 2024-05-07 DIAGNOSIS — K7469 Other cirrhosis of liver: Secondary | ICD-10-CM

## 2024-05-07 DIAGNOSIS — I4891 Unspecified atrial fibrillation: Secondary | ICD-10-CM | POA: Diagnosis not present

## 2024-05-07 DIAGNOSIS — R6521 Severe sepsis with septic shock: Secondary | ICD-10-CM | POA: Diagnosis not present

## 2024-05-07 DIAGNOSIS — K7200 Acute and subacute hepatic failure without coma: Secondary | ICD-10-CM | POA: Diagnosis not present

## 2024-05-07 DIAGNOSIS — N1832 Chronic kidney disease, stage 3b: Secondary | ICD-10-CM

## 2024-05-07 DIAGNOSIS — R570 Cardiogenic shock: Secondary | ICD-10-CM | POA: Diagnosis not present

## 2024-05-07 DIAGNOSIS — D689 Coagulation defect, unspecified: Secondary | ICD-10-CM

## 2024-05-07 DIAGNOSIS — E871 Hypo-osmolality and hyponatremia: Secondary | ICD-10-CM

## 2024-05-07 DIAGNOSIS — D62 Acute posthemorrhagic anemia: Secondary | ICD-10-CM

## 2024-05-07 DIAGNOSIS — E872 Acidosis, unspecified: Secondary | ICD-10-CM | POA: Diagnosis not present

## 2024-05-07 DIAGNOSIS — G9341 Metabolic encephalopathy: Secondary | ICD-10-CM | POA: Diagnosis not present

## 2024-05-07 DIAGNOSIS — N189 Chronic kidney disease, unspecified: Secondary | ICD-10-CM | POA: Diagnosis not present

## 2024-05-07 DIAGNOSIS — R748 Abnormal levels of other serum enzymes: Secondary | ICD-10-CM | POA: Diagnosis not present

## 2024-05-07 DIAGNOSIS — Z95810 Presence of automatic (implantable) cardiac defibrillator: Secondary | ICD-10-CM

## 2024-05-07 DIAGNOSIS — D72829 Elevated white blood cell count, unspecified: Secondary | ICD-10-CM

## 2024-05-07 DIAGNOSIS — A419 Sepsis, unspecified organism: Secondary | ICD-10-CM | POA: Diagnosis not present

## 2024-05-07 DIAGNOSIS — I5023 Acute on chronic systolic (congestive) heart failure: Secondary | ICD-10-CM | POA: Diagnosis not present

## 2024-05-07 DIAGNOSIS — N179 Acute kidney failure, unspecified: Secondary | ICD-10-CM | POA: Diagnosis not present

## 2024-05-07 DIAGNOSIS — K921 Melena: Secondary | ICD-10-CM | POA: Diagnosis not present

## 2024-05-07 LAB — RENAL FUNCTION PANEL
Albumin: 3 g/dL — ABNORMAL LOW (ref 3.5–5.0)
Anion gap: 10 (ref 5–15)
BUN: 22 mg/dL — ABNORMAL HIGH (ref 6–20)
CO2: 23 mmol/L (ref 22–32)
Calcium: 8 mg/dL — ABNORMAL LOW (ref 8.9–10.3)
Chloride: 101 mmol/L (ref 98–111)
Creatinine, Ser: 2.01 mg/dL — ABNORMAL HIGH (ref 0.61–1.24)
GFR, Estimated: 37 mL/min — ABNORMAL LOW
Glucose, Bld: 108 mg/dL — ABNORMAL HIGH (ref 70–99)
Phosphorus: 1.9 mg/dL — ABNORMAL LOW (ref 2.5–4.6)
Potassium: 4 mmol/L (ref 3.5–5.1)
Sodium: 134 mmol/L — ABNORMAL LOW (ref 135–145)

## 2024-05-07 LAB — COMPREHENSIVE METABOLIC PANEL WITH GFR
ALT: 665 U/L — ABNORMAL HIGH (ref 0–44)
AST: 1448 U/L — ABNORMAL HIGH (ref 15–41)
Albumin: 2.9 g/dL — ABNORMAL LOW (ref 3.5–5.0)
Alkaline Phosphatase: 157 U/L — ABNORMAL HIGH (ref 38–126)
Anion gap: 12 (ref 5–15)
BUN: 29 mg/dL — ABNORMAL HIGH (ref 6–20)
CO2: 23 mmol/L (ref 22–32)
Calcium: 8.8 mg/dL — ABNORMAL LOW (ref 8.9–10.3)
Chloride: 99 mmol/L (ref 98–111)
Creatinine, Ser: 3.11 mg/dL — ABNORMAL HIGH (ref 0.61–1.24)
GFR, Estimated: 22 mL/min — ABNORMAL LOW
Glucose, Bld: 138 mg/dL — ABNORMAL HIGH (ref 70–99)
Potassium: 4.5 mmol/L (ref 3.5–5.1)
Sodium: 133 mmol/L — ABNORMAL LOW (ref 135–145)
Total Bilirubin: 9 mg/dL — ABNORMAL HIGH (ref 0.0–1.2)
Total Protein: 5.5 g/dL — ABNORMAL LOW (ref 6.5–8.1)

## 2024-05-07 LAB — GLUCOSE, CAPILLARY
Glucose-Capillary: 118 mg/dL — ABNORMAL HIGH (ref 70–99)
Glucose-Capillary: 121 mg/dL — ABNORMAL HIGH (ref 70–99)
Glucose-Capillary: 111 mg/dL — ABNORMAL HIGH (ref 70–99)
Glucose-Capillary: 115 mg/dL — ABNORMAL HIGH (ref 70–99)
Glucose-Capillary: 103 mg/dL — ABNORMAL HIGH (ref 70–99)
Glucose-Capillary: 102 mg/dL — ABNORMAL HIGH (ref 70–99)
Glucose-Capillary: 105 mg/dL — ABNORMAL HIGH (ref 70–99)

## 2024-05-07 LAB — CBC
HCT: 23.1 % — ABNORMAL LOW (ref 39.0–52.0)
Hemoglobin: 7.7 g/dL — ABNORMAL LOW (ref 13.0–17.0)
MCH: 25 pg — ABNORMAL LOW (ref 26.0–34.0)
MCHC: 33.3 g/dL (ref 30.0–36.0)
MCV: 75 fL — ABNORMAL LOW (ref 80.0–100.0)
Platelets: 151 K/uL (ref 150–400)
RBC: 3.08 MIL/uL — ABNORMAL LOW (ref 4.22–5.81)
RDW: 23.6 % — ABNORMAL HIGH (ref 11.5–15.5)
WBC: 13.9 K/uL — ABNORMAL HIGH (ref 4.0–10.5)
nRBC: 0.6 % — ABNORMAL HIGH (ref 0.0–0.2)

## 2024-05-07 LAB — PROTIME-INR
INR: 3.2 — ABNORMAL HIGH (ref 0.8–1.2)
Prothrombin Time: 34.1 s — ABNORMAL HIGH (ref 11.4–15.2)

## 2024-05-07 LAB — BPAM RBC
Blood Product Expiration Date: 202601252359
ISSUE DATE / TIME: 202601021755
Unit Type and Rh: 7300

## 2024-05-07 LAB — COOXEMETRY PANEL
Carboxyhemoglobin: 1.6 % — ABNORMAL HIGH (ref 0.5–1.5)
Carboxyhemoglobin: 1.4 % (ref 0.5–1.5)
Methemoglobin: 1 % (ref 0.0–1.5)
Methemoglobin: 1 % (ref 0.0–1.5)
O2 Saturation: 99.2 %
O2 Saturation: 70.5 %
Total hemoglobin: 7.8 g/dL — ABNORMAL LOW (ref 12.0–16.0)
Total hemoglobin: 8 g/dL — ABNORMAL LOW (ref 12.0–16.0)

## 2024-05-07 LAB — TYPE AND SCREEN
ABO/RH(D): B POS
Antibody Screen: NEGATIVE
Unit division: 0

## 2024-05-07 LAB — BASIC METABOLIC PANEL WITH GFR
Anion gap: 11 (ref 5–15)
BUN: 19 mg/dL (ref 6–20)
CO2: 21 mmol/L — ABNORMAL LOW (ref 22–32)
Calcium: 7.7 mg/dL — ABNORMAL LOW (ref 8.9–10.3)
Chloride: 101 mmol/L (ref 98–111)
Creatinine, Ser: 1.74 mg/dL — ABNORMAL HIGH (ref 0.61–1.24)
GFR, Estimated: 44 mL/min — ABNORMAL LOW
Glucose, Bld: 106 mg/dL — ABNORMAL HIGH (ref 70–99)
Potassium: 4 mmol/L (ref 3.5–5.1)
Sodium: 133 mmol/L — ABNORMAL LOW (ref 135–145)

## 2024-05-07 LAB — HEMOGLOBIN AND HEMATOCRIT, BLOOD
HCT: 23 % — ABNORMAL LOW (ref 39.0–52.0)
HCT: 24 % — ABNORMAL LOW (ref 39.0–52.0)
HCT: 24.1 % — ABNORMAL LOW (ref 39.0–52.0)
Hemoglobin: 7.6 g/dL — ABNORMAL LOW (ref 13.0–17.0)
Hemoglobin: 7.9 g/dL — ABNORMAL LOW (ref 13.0–17.0)
Hemoglobin: 8 g/dL — ABNORMAL LOW (ref 13.0–17.0)

## 2024-05-07 LAB — URINE CULTURE: Culture: NO GROWTH

## 2024-05-07 LAB — MAGNESIUM: Magnesium: 1.7 mg/dL (ref 1.7–2.4)

## 2024-05-07 LAB — LACTIC ACID, PLASMA
Lactic Acid, Venous: 3.8 mmol/L (ref 0.5–1.9)
Lactic Acid, Venous: 2.8 mmol/L (ref 0.5–1.9)

## 2024-05-07 MED ORDER — SODIUM PHOSPHATES 45 MMOLE/15ML IV SOLN
30.0000 mmol | Freq: Once | INTRAVENOUS | Status: AC
  Administered 2024-05-07: 30 mmol via INTRAVENOUS
  Filled 2024-05-07: qty 10

## 2024-05-07 MED ORDER — VANCOMYCIN HCL IN DEXTROSE 1-5 GM/200ML-% IV SOLN: 1000.0000 mg | INTRAVENOUS | Status: DC

## 2024-05-07 MED ORDER — PRISMASOL BGK 4/2.5 32-4-2.5 MEQ/L EC SOLN: Status: DC

## 2024-05-07 MED ORDER — VITAMIN K1 10 MG/ML IJ SOLN
5.0000 mg | Freq: Once | INTRAVENOUS | Status: AC
  Administered 2024-05-07: 5 mg via INTRAVENOUS
  Filled 2024-05-07: qty 0.5

## 2024-05-07 MED ORDER — SODIUM CHLORIDE 0.9 % IV SOLN
500.0000 [IU]/h | INTRAVENOUS | Status: DC
  Administered 2024-05-07 – 2024-05-08 (×3): 500 [IU]/h via INTRAVENOUS_CENTRAL
  Filled 2024-05-07 (×2): qty 10000
  Filled 2024-05-07: qty 2

## 2024-05-07 NOTE — Care Management (Addendum)
 Spoke w patients niece Wilkins Louder [phone]).  She confirms that the contact listed, Kathern Servant is the patient's ex.  She states that she has reached out to the patient's brother by FB messenger to notify of hospitalization. She has also left a text with his nephew, Jana [phone], who lives with him, requesting him to call her back    16:15 notified by bedside nurse that he received a call from  a Nunleah Wislon [phone]) who claimed to be his current wife and wanted to do a wellness check up.   I called niece Alayne to confirm, but there was no answer and no VM. Called nephew Jana, no answer, LVM requesting callback.   16:30 Elahi called back and said the patient is not married, Chyrl is a girlfriend. I explained that she is not listed as a contact and we cannot provide updates over the phone, currently the only contact is Nesha and we can only update her

## 2024-05-07 NOTE — Plan of Care (Signed)
" °  Problem: Education: Goal: Ability to describe self-care measures that may prevent or decrease complications (Diabetes Survival Skills Education) will improve Outcome: Progressing Goal: Individualized Educational Video(s) Outcome: Progressing   Problem: Coping: Goal: Ability to adjust to condition or change in health will improve Outcome: Progressing   Problem: Fluid Volume: Goal: Ability to maintain a balanced intake and output will improve Outcome: Progressing   Problem: Health Behavior/Discharge Planning: Goal: Ability to identify and utilize available resources and services will improve Outcome: Progressing Goal: Ability to manage health-related needs will improve Outcome: Progressing   Problem: Metabolic: Goal: Ability to maintain appropriate glucose levels will improve Outcome: Progressing   Problem: Nutritional: Goal: Maintenance of adequate nutrition will improve Outcome: Progressing Goal: Progress toward achieving an optimal weight will improve Outcome: Progressing   Problem: Skin Integrity: Goal: Risk for impaired skin integrity will decrease Outcome: Progressing   Problem: Tissue Perfusion: Goal: Adequacy of tissue perfusion will improve Outcome: Progressing   Problem: Clinical Measurements: Goal: Ability to maintain clinical measurements within normal limits will improve Outcome: Progressing Goal: Will remain free from infection Outcome: Progressing Goal: Diagnostic test results will improve Outcome: Progressing Goal: Respiratory complications will improve Outcome: Progressing Goal: Cardiovascular complication will be avoided Outcome: Progressing   Problem: Elimination: Goal: Will not experience complications related to bowel motility Outcome: Progressing Goal: Will not experience complications related to urinary retention Outcome: Progressing   Problem: Pain Managment: Goal: General experience of comfort will improve and/or be  controlled Outcome: Progressing   Problem: Safety: Goal: Ability to remain free from injury will improve Outcome: Progressing   "

## 2024-05-07 NOTE — Progress Notes (Signed)
 " [facility] Progress Note   Subjective:   agitated overnight, precedex  added. Remains on vasopressor support.  UOP yesterday, CRRT UF 2.5L, net +1.3L yesterday.  TTE EF < 20%, IVC dilated with dec resp variability.  LV thrombus noted - AC held due to GIB concern.  Cardiology c/s today.  Objective Vitals:   05/07/24 0630 05/07/24 0645 05/07/24 0700 05/07/24 0800  BP:      Pulse: (!) 56 (!) 58 (!) 55 (!) 52  Resp: (!) 25 (!) 23 (!) 21 20  Temp:    (!) 97.3 F (36.3 C)  TempSrc:    Axillary  SpO2: 98% 98% 96% 96%  Weight:       Physical Exam General: sleeping calm Heart:brady Lungs: coarse Abdomen: soft, mildly distended Extremities: 1+ LE edema Dialysis Access:  temp HD catheter c/d/i  Additional Objective Labs: Basic Metabolic Panel: Recent Labs  Lab 05/06/24 0433 05/06/24 1101 05/06/24 1644 05/06/24 2022 05/07/24 0310  NA 128*   < > 130* 131* 133*  K 6.3*   < > 5.1 5.1 4.5  CL 89*   < > 95* 96* 99  CO2 12*   < > 20* 19* 23  GLUCOSE 105*   < > 117* 108* 138*  BUN 42*   < > 37* 35* 29*  CREATININE 4.79*   < > 3.78* 3.67* 3.11*  CALCIUM  9.9   < > 8.9 8.9 8.8*  PHOS 5.3*  --   --   --   --    < > = values in this interval not displayed.   Liver Function Tests: Recent Labs  Lab 05/06/24 0053 05/06/24 0433 05/07/24 0310  AST 1,885*  --  1,448*  ALT 572*  --  665*  ALKPHOS 136*  --  157*  BILITOT 6.0*  --  9.0*  PROT 5.1*  --  5.5*  ALBUMIN 2.6* 3.1* 2.9*   No results for input(s): LIPASE, AMYLASE in the last 168 hours. CBC: Recent Labs  Lab 05/06/24 0250 05/06/24 1101 05/06/24 1644 05/06/24 2311 05/07/24 0310  WBC 15.8*  --   --   --  13.9*  NEUTROABS 13.7*  --   --   --   --   HGB 7.2*   < > 7.0* 7.5* 7.7*  HCT 23.6*   < > 21.6* 22.9* 23.1*  MCV 79.2*  --   --   --  75.0*  PLT 217  --   --   --  151   < > = values in this interval not displayed.   Blood Culture    Component Value Date/Time   SDES BLOOD SITE NOT  SPECIFIED 05/05/2024 0121   SPECREQUEST  05/05/2024 0121    BOTTLES DRAWN AEROBIC AND ANAEROBIC Blood Culture adequate volume   CULT GRAM POSITIVE COCCI 05/05/2024 0121   REPTSTATUS PENDING 05/05/2024 0121    Cardiac Enzymes: No results for input(s): CKTOTAL, CKMB, CKMBINDEX, TROPONINI in the last 168 hours. CBG: Recent Labs  Lab 05/06/24 1612 05/06/24 2004 05/06/24 2327 05/07/24 0330 05/07/24 0745  GLUCAP 112* 102* 121* 118* 121*   Iron  Studies: No results for input(s): IRON , TIBC, TRANSFERRIN, FERRITIN in the last 72 hours. @lablastinr3 @ Studies/Results: US  ABDOMEN LIMITED WITH LIVER DOPPLER Result Date: 05/06/2024 CLINICAL DATA:  Transaminitis.  History of chronic heart failure. EXAM: DUPLEX ULTRASOUND OF LIVER TECHNIQUE: Color and duplex Doppler ultrasound was performed to evaluate the hepatic in-flow and out-flow vessels. COMPARISON:  Abdomen pelvis 01/12/2024 FINDINGS: Liver: Gallbladder  is decompressed with mild wall thickening measuring up to 0.6 cm. These findings are nonspecific. Common bile duct measures 0.3 cm. Liver contour is slightly nodular. No discrete liver lesion. Main Portal Vein size: 0.8 cm Portal Vein Velocities Main Prox:  44 cm/sec Main Mid: 47 cm/sec Main Dist:  55 cm/sec Right: 79 cm/sec Left: 46 cm/sec Hepatic Vein Velocities Right:  87 cm/sec Middle:  36 cm/sec Left:  33 cm/sec IVC: Present and patent with normal respiratory phasicity. Hepatic Artery Velocity:  146 cm/sec Splenic Vein Velocity:  41 cm/sec Spleen was not visualized. Portal Vein Occlusion/Thrombus: No Splenic Vein Occlusion/Thrombus: No Ascites: None Varices: None Bidirectional flow in the portal veins with hepatopetal and hepatofugal flow. Primarily hepatofugal flow in the hepatic veins. IMPRESSION: 1. Portal venous system is patent with bidirectional flow. This finding is likely associated with the patient's chronic heart failure. 2. Hepatic veins are patent. 3. Gallbladder is  decompressed with mild wall thickening. These findings are nonspecific. 4. Liver has a slightly nodular contour. Mild cirrhotic changes cannot be excluded. Electronically Signed   By: Juliene Balder M.D.   On: 05/06/2024 18:26   ECHOCARDIOGRAM COMPLETE Result Date: 05/06/2024    ECHOCARDIOGRAM REPORT   Patient Name:   KAROL LIENDO Date of Exam: 05/06/2024 Medical Rec #:  [id number]           Height:       68.0 in Accession #:    [id number]          Weight:       194.9 lb Date of Birth:  06-06-1965            BSA:          2.021 m Patient Age:    59 years            BP:           112/95 mmHg Patient Gender: M                   HR:           116 bpm. Exam Location:  Inpatient Procedure: 2D Echo, Cardiac Doppler, Color Doppler and Intracardiac            Opacification Agent (Both Spectral and Color Flow Doppler were            utilized during procedure). Indications:    Shock R57.9  History:        Patient has prior history of Echocardiogram examinations, most                 recent 01/13/2024. CHF.  Sonographer:    Nathanel Devonshire Referring Phys: [id number] TARA N WILSON IMPRESSIONS  1. LV thrombus present in the apex. Left ventricular ejection fraction, by estimation, is <20%. The left ventricle has severely decreased function. The left ventricle demonstrates global hypokinesis. The left ventricular internal cavity size was mildly to moderately dilated. Left ventricular diastolic function could not be evaluated. There is the interventricular septum is flattened in systole, consistent with right ventricular pressure overload.  2. RVSP underestimated due to early equilization of pressures, severe TR. Right ventricular systolic function is severely reduced. The right ventricular size is severely enlarged.  3. Left atrial size was moderately dilated.  4. Right atrial size was severely dilated.  5. The mitral valve is normal in structure. Mild mitral valve regurgitation. No evidence of mitral stenosis.  6. Severe lead related TR.  The tricuspid valve is degenerative. Tricuspid valve regurgitation  is severe.  7. The aortic valve is tricuspid. Aortic valve regurgitation is not visualized. Aortic valve sclerosis is present, with no evidence of aortic valve stenosis.  8. The inferior vena cava is dilated in size with <50% respiratory variability, suggesting right atrial pressure of 15 mmHg. FINDINGS  Left Ventricle: LV thrombus present in the apex. Left ventricular ejection fraction, by estimation, is <20%. The left ventricle has severely decreased function. The left ventricle demonstrates global hypokinesis. Definity  contrast agent was given IV to delineate the left ventricular endocardial borders. The left ventricular internal cavity size was mildly to moderately dilated. There is no left ventricular hypertrophy. The interventricular septum is flattened in systole, consistent with right ventricular pressure overload. Left ventricular diastolic function could not be evaluated due to paced rhythm. Left ventricular diastolic function could not be evaluated. Right Ventricle: RVSP underestimated due to early equilization of pressures, severe TR. The right ventricular size is severely enlarged. No increase in right ventricular wall thickness. Right ventricular systolic function is severely reduced. The tricuspid regurgitant velocity is 1.77 m/s, and with an assumed right atrial pressure of 15 mmHg, the estimated right ventricular systolic pressure is 27.5 mmHg. Left Atrium: Left atrial size was moderately dilated. Right Atrium: Right atrial size was severely dilated. Pericardium: There is no evidence of pericardial effusion. Mitral Valve: The mitral valve is normal in structure. Mild mitral valve regurgitation. No evidence of mitral valve stenosis. Tricuspid Valve: Severe lead related TR. The tricuspid valve is degenerative in appearance. Tricuspid valve regurgitation is severe. No evidence of tricuspid stenosis. Aortic Valve: The aortic valve is  tricuspid. Aortic valve regurgitation is not visualized. Aortic valve sclerosis is present, with no evidence of aortic valve stenosis. Aortic valve mean gradient measures 4.0 mmHg. Aortic valve peak gradient measures 6.9  mmHg. Aortic valve area, by VTI measures 1.60 cm. Pulmonic Valve: The pulmonic valve was normal in structure. Pulmonic valve regurgitation is not visualized. No evidence of pulmonic stenosis. Aorta: The aortic root is normal in size and structure. Venous: The inferior vena cava is dilated in size with less than 50% respiratory variability, suggesting right atrial pressure of 15 mmHg. IAS/Shunts: No atrial level shunt detected by color flow Doppler. Additional Comments: A device lead is visualized.  LEFT VENTRICLE PLAX 2D LVIDd:         6.20 cm LVIDs:         5.70 cm LV PW:         0.90 cm LV IVS:        1.00 cm LVOT diam:     2.00 cm LV SV:         29 LV SV Index:   14 LVOT Area:     3.14 cm  LV Volumes (MOD) LV vol d, MOD A2C: 134.0 ml LV vol d, MOD A4C: 122.0 ml LV vol s, MOD A2C: 109.0 ml LV vol s, MOD A4C: 98.7 ml LV SV MOD A2C:     25.0 ml LV SV MOD A4C:     122.0 ml LV SV MOD BP:      22.6 ml RIGHT VENTRICLE            IVC RV Basal diam:  5.00 cm    IVC diam: 4.10 cm RV S prime:     9.03 cm/s TAPSE (M-mode): 1.3 cm LEFT ATRIUM             Index        RIGHT ATRIUM  Index LA diam:        4.90 cm 2.42 cm/m   RA Area:     31.90 cm LA Vol (A2C):   78.9 ml 39.03 ml/m  RA Volume:   124.00 ml 61.34 ml/m LA Vol (A4C):   80.7 ml 39.92 ml/m LA Biplane Vol: 82.2 ml 40.67 ml/m  AORTIC VALVE                     PULMONIC VALVE AV Area (Vmax):    1.47 cm      PV Vmax:       0.73 m/s AV Area (Vmean):   1.15 cm      PV Peak grad:  2.1 mmHg AV Area (VTI):     1.60 cm AV Vmax:           131.00 cm/s AV Vmean:          101.000 cm/s AV VTI:            0.181 m AV Peak Grad:      6.9 mmHg AV Mean Grad:      4.0 mmHg LVOT Vmax:         61.10 cm/s LVOT Vmean:        36.900 cm/s LVOT VTI:           0.092 m LVOT/AV VTI ratio: 0.51  AORTA Ao Root diam: 3.40 cm Ao Asc diam:  3.60 cm TRICUSPID VALVE TR Peak grad:   12.5 mmHg TR Vmax:        177.00 cm/s  SHUNTS Systemic VTI:  0.09 m Systemic Diam: 2.00 cm Morene Brownie Electronically signed by Morene Brownie Signature Date/Time: 05/06/2024/4:43:03 PM    Final    CT HEAD WO CONTRAST ( ) Result Date: 05/06/2024 EXAM: CT HEAD WITHOUT CONTRAST 05/06/2024 09:36:20 AM TECHNIQUE: CT of the head was performed without the administration of intravenous contrast. Automated exposure control, iterative reconstruction, and/or weight based adjustment of the mA/kV was utilized to reduce the radiation dose to as low as reasonably achievable. COMPARISON: MRI brain 08/27/2020. CLINICAL HISTORY: Altered mental status, nontraumatic (Ped 0-17y). FINDINGS: BRAIN AND VENTRICLES: No acute hemorrhage. No evidence of acute infarct. Focal encephalomalacia in the anterior inferior right frontal lobe was similar to prior and may reflect remote infarct versus sequelae of prior trauma. Small remote bilateral cerebellar infarcts. Calcific atherosclerosis. No hydrocephalus. No extra-axial collection. No mass effect or midline shift. ORBITS: No acute abnormality. SINUSES: No acute abnormality. SOFT TISSUES AND SKULL: No acute soft tissue abnormality. No skull fracture. IMPRESSION: 1. No acute intracranial abnormality. 2. Focal encephalomalacia in the anterior inferior right frontal lobe, similar to prior MRI, which may reflect remote infarct versus sequelae of prior trauma. 3. Small remote bilateral cerebellar infarcts. Electronically signed by: Donnice Mania MD 05/06/2024 09:59 AM EST RP Workstation: [id number]   DG Chest Portable 1 View Result Date: 05/06/2024 EXAM: 1 VIEW(S) XRAY OF THE CHEST 05/06/2024 01:09:00 AM COMPARISON: 05/05/2024 CLINICAL HISTORY: post central line attempt FINDINGS: LINES, TUBES AND DEVICES: Cardiac paddles overlie chest. Left chest AICD/pacemaker noted. LUNGS AND  PLEURA: No focal pulmonary opacity. No pleural effusion. No pneumothorax. HEART AND MEDIASTINUM: Left chest AICD/pacemaker noted. Persistent cardiomegaly. No acute abnormality of the mediastinal silhouette. BONES AND SOFT TISSUES: No acute osseous abnormality. IMPRESSION: 1. No acute cardiopulmonary process. 2. Persistent cardiomegaly. Electronically signed by: Greig Pique MD 05/06/2024 01:18 AM EST RP Workstation: [id number]   DG Chest Portable 1 View Result Date: 05/05/2024 EXAM: 1 VIEW(S) XRAY  OF THE CHEST 05/05/2024 11:21:00 PM COMPARISON: 03/14/2024 CLINICAL HISTORY: SOB FINDINGS: LINES, TUBES AND DEVICES: Overlying defibrillator pads. Left chest AICD/Pacemaker with 2 leads overlying the heart. LUNGS AND PLEURA: No focal pulmonary opacity. No pleural effusion. No pneumothorax. HEART AND MEDIASTINUM: Cardiomegaly. BONES AND SOFT TISSUES: No acute osseous abnormality. IMPRESSION: 1. No acute findings. 2. Cardiomegaly with left chest AICD/pacemaker and 2 leads. Electronically signed by: Greig Pique MD 05/05/2024 11:53 PM EST RP Workstation: [id number]   Medications:  acetylcysteine  6.25 mg/kg/hr (05/07/24 0800)   ceFEPime  (MAXIPIME ) IV Stopped (05/07/24 0000)   dexmedetomidine  (PRECEDEX ) IV infusion 0.5 mcg/kg/hr (05/07/24 0800)   metronidazole  500 mg (05/07/24 0818)   norepinephrine  (LEVOPHED ) Adult infusion 11 mcg/min (05/07/24 0800)   octreotide  (SANDOSTATIN ) 500 mcg in sodium chloride  0.9 % 250 mL (2 mcg/mL) infusion 50 mcg/hr (05/07/24 0816)   phytonadione  (VITAMIN K ) 5 mg in dextrose  5 % 50 mL IVPB     PrismaSol  BGK 2/3.5 400 mL/hr at 05/07/24 0210   PrismaSol  BGK 2/3.5 400 mL/hr at 05/07/24 0052   PrismaSol  BGK 2/3.5 1,000 mL/hr at 05/07/24 0759   vancomycin      vasopressin  0.04 Units/min (05/07/24 0800)    Chlorhexidine  Gluconate Cloth  6 each Topical Daily   insulin  aspart  0-6 Units Subcutaneous Q4H   pantoprazole  (PROTONIX ) IV  40 mg Intravenous Q12H   topiramate   25 mg Oral Daily     Assessment 19M severe AKI with hyperkalemia and EKG changes; undifferentiated shock (cardiogenic, hemorrhagic, septic?),  HFrEF with history of ICD, history of esophageal varices presenting with GI bleed, hypothermia, persistent hypoglycemia, LFT elevation   AKI on CKD3 secondary to #3 Severe hyperkalemia with EKG changes resolved with CRRT Multifactorial shock on norepinephrine  - TTE with EF < 20%, likely cardiogenic contributing ABLA/GI bleed, history of esophageal varices placed on octreotide  and norepinephrine , requiring transfusions Severe hypoglycemia improved Severe hypothermia improving Severe metabolic acidosis with elevated lactate improving Coagulopathy with elevated INR s/p vit K Hepatitis - improving, query shock liver   Plan Continue CRRT, change from 2K to 4K, continue BID labs UF with CRRT for goal net negative today based on TTE findings - 50 to 174ml/hr net neg for now Agree with Co ox and CVP assessment, may need intropes - cardiology c/s today Cont holding SGLT2, MRB, ARB  D/w primary today   Manuelita Barters MD 05/07/2024, 8:42 AM  [facility] Pager: [phone]   "

## 2024-05-07 NOTE — Progress Notes (Signed)
 Pharmacy Antibiotic Note  Cody Guzman is a 59 y.o. male admitted on 05/05/2024 with sepsis.  Pharmacy has been consulted for Vancomycin  and Cefepime  dosing.  Plan: Cefepime  2gm IV q12h Vancomycin  1000mg  IV Q 24 hrs while on CRRT Will f/u CRRT tolerance, micro data, and pt's clinical condition Vanc levels prn   Weight: 88.4 kg (194 lb 14.2 oz)  Temp (24hrs), Avg:97.4 F (36.3 C), Min:96.7 F (35.9 C), Max:98 F (36.7 C)  Recent Labs  Lab 05/06/24 0250 05/06/24 0433 05/06/24 1101 05/06/24 1430 05/06/24 1644 05/06/24 2016 05/06/24 2022 05/06/24 2311 05/07/24 0310  WBC 15.8*  --   --   --   --   --   --   --  13.9*  CREATININE  --    < > 4.72* 4.12* 3.78*  --  3.67*  --  3.11*  LATICACIDVEN  --    < >  --  6.5* 5.4* 4.6*  --  3.8* 2.8*   < > = values in this interval not displayed.    Estimated Creatinine Clearance: 27.3 mL/min (A) (by C-G formula based on SCr of 3.11 mg/dL (H)).    Allergies[1]  Antimicrobials this admission: 1/2 Vanc >>  1/2 Cefepime  >>  1/2 Flagyl  >>   Microbiology results: 1/1 BCx: GPC >> staph epi 1/2 MRSA PCR: negative  Thank you for allowing pharmacy to be a part of this patients care.  Rankin Sams, PharmD, BCCCP Clinical Pharmacist    [1] No Known Allergies
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
## 2024-05-07 NOTE — Progress Notes (Signed)
 Received a call from Nunleah Wislon [phone]), who claimed to be his current wife, and wanted to do a wellness check up.    I did not feel comfortable giving her any updates since I could not verify info and she is not listed in emergency contacts. She started to cry b/c I did not provide her w/any updates. Told her I would have CM/SW reach out so we could Have a person make decisions for patient.

## 2024-05-07 NOTE — Progress Notes (Signed)
 Pt's niece Wilkins Louder [phone]) at bedside. She's trying to get hold of children who are all out of state.   We only have ex girlfriend listed as emergency contact and making decisions. She lives in [place] .  Send message to CM/SW to see if they can get a hold of next of kin.
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
## 2024-05-07 NOTE — Progress Notes (Signed)
 "  NAME:  Cody Guzman, MRN:  [id number], DOB:  1966/01/17, LOS: 1 ADMISSION DATE:  05/05/2024, CONSULTATION DATE:  05/06/24 REFERRING MD:  Nancyann Kin, MD, CHIEF COMPLAINT:  rectal bleed  History of Present Illness:  History obtained via chart review patient is encephalopathic   59  year old male history of chronic HFrEF s/p ICD, HTN, BPAD, history of GI bleed, history of esophageal varices, and CKD stage III who presented to the ED with progressive altered mental status and 3 days of blood in stool. Initial evaluation was concerning for wide complex tachyarrhythmia and he was given calcium . He was also found to be hypothermic, hypoglycemic and hypotensive with acute renal failure. He was started on levophed  and a trialysis line was placed.   Pertinent  Medical History  CAD, Cardiomyopathy s/p defibrillator, CKD, HTN, neuropathy, Atrial fib/flutter, Anemia, CHF, RBBB  Significant Hospital Events: Including procedures, antibiotic start and stop dates in addition to other pertinent events   1/2: admit to ICU on NE/vaso, CRRT started  Interim History / Subjective:  Patient overnight was waking up more and was getting agitated.  He was placed on Precedex .  Currently he is very sedated on Precedex .  Objective    Blood pressure (!) 88/62, pulse 66, temperature (!) 97.3 F (36.3 C), temperature source Axillary, resp. rate 19, weight 88.4 kg, SpO2 98%.        Intake/Output Summary (Last 24 hours) at 05/07/2024 1154 Last data filed at 05/07/2024 1100 Gross per 24 hour  Intake 4118.17 ml  Output 3405 ml  Net 713.17 ml   Filed Weights   05/06/24 1000  Weight: 88.4 kg    Examination: General: Ill-appearing 59 year old male. Lungs: clear to auscultation bilaterally.  Heart: regular rate rhythm, no murmur appreciated.  Abdomen nontender distended. Neuro: Alert but confused.  Moving all extremities.  Resolved problem list   Assessment and Plan  Shock: Likely septic versus  secondary to acidosis: AKI on CKD: Hyperkalemia : Hyponatremia: Lactic acidosis: History of A-fib/flutter, wide-complex tachycardia: Hyperkalemia: Acute metabolic encephalopathy: - Holding amiodarone  and digoxin . - Broad-spectrum antibiotics with vancomycin  cefepime  and Flagyl . - CT head negative. - Lactic acidosis improving. - On CRRT. - Unclear cause of AKI on CKD.  Unclear cause of lactic acidosis.  Covering for septic shock.  AKI likely related to ATN from shock. - Off Precedex .  Can restart if agitated.  Ammonia 39.   Acute liver failure: Elevated INR: - Likely related to shock liver.  Rule out other causes. - Tylenol  level, acute hepatitis panel negative.  Urine toxicology still pending.  Liver ultrasound pending.   - Status post vitamin K .  - GI consult. - Hold amiodarone . - NAC x 5 days.    LGI bleed: - Status post 1 unit PRBC. - Status post Kcentra  and vitamin K . - IV PPI twice daily. - Started on octreotide  with history of varices. -    Biventricular failure: LV thrombus: - Cannot anticoagulate patient. - Will need to interrogate his ICD/pacemaker. - Holding cardiac meds as above. - cards consulted.  - removing fluid with CRRT.   History of atrial fibrillation/flutter; currently V-paced on tele  Wide complex tachyarrhythmia; likely in the setting of hyperkalemia, resolved  - Unclear if he is on Eliquis  at home, pharmacy to confirm with family - Holding [facility] for now in the setting of acute bleed and coagulopathy   Patient Lines/Drains/Airways Status     Active Line/Drains/Airways     Name Placement date Placement time Site Days  Peripheral IV 05/05/24 22 G Anterior;Left 05/05/24  --  --  2   Hemodialysis Catheter Left Femoral vein Triple lumen Temporary (Non-Tunneled) 05/06/24  0221  Femoral vein  1   Midline Single Lumen 05/06/24 Left Basilic 8 cm 0 cm 05/06/24  0105  Basilic  1   Urethral Catheter Izetta MATSU, RN Latex 16 Fr. 05/06/24  0309  Latex  1    Wound 05/06/24 1000 Other (Comment) Abdomen Left;Upper 05/06/24  1000  Abdomen  1             Labs   CBC: Recent Labs  Lab 05/06/24 0250 05/06/24 1101 05/06/24 1110 05/06/24 1644 05/06/24 2311 05/07/24 0310 05/07/24 1100  WBC 15.8*  --   --   --   --  13.9*  --   NEUTROABS 13.7*  --   --   --   --   --   --   HGB 7.2*   < > 8.5* 7.0* 7.5* 7.7* 7.6*  HCT 23.6*   < > 25.0* 21.6* 22.9* 23.1* 23.0*  MCV 79.2*  --   --   --   --  75.0*  --   PLT 217  --   --   --   --  151  --    < > = values in this interval not displayed.    Basic Metabolic Panel: Recent Labs  Lab 05/06/24 0433 05/06/24 1101 05/06/24 1110 05/06/24 1430 05/06/24 1644 05/06/24 2022 05/07/24 0310  NA 128* 127* 127* 130* 130* 131* 133*  K 6.3* 6.3* 6.1* 5.2* 5.1 5.1 4.5  CL 89* 91*  --  94* 95* 96* 99  CO2 12* 14*  --  19* 20* 19* 23  GLUCOSE 105* 112*  --  134* 117* 108* 138*  BUN 42* 43*  --  39* 37* 35* 29*  CREATININE 4.79* 4.72*  --  4.12* 3.78* 3.67* 3.11*  CALCIUM  9.9 9.3  --  9.2 8.9 8.9 8.8*  MG 2.3  --   --   --   --   --  1.7  PHOS 5.3*  --   --   --   --   --   --    GFR: Estimated Creatinine Clearance: 27.3 mL/min (A) (by C-G formula based on SCr of 3.11 mg/dL (H)). Recent Labs  Lab 05/06/24 0250 05/06/24 0647 05/06/24 1644 05/06/24 2016 05/06/24 2311 05/07/24 0310  WBC 15.8*  --   --   --   --  13.9*  LATICACIDVEN  --    < > 5.4* 4.6* 3.8* 2.8*   < > = values in this interval not displayed.    Liver Function Tests: Recent Labs  Lab 05/06/24 0053 05/06/24 0433 05/07/24 0310  AST 1,885*  --  1,448*  ALT 572*  --  665*  ALKPHOS 136*  --  157*  BILITOT 6.0*  --  9.0*  PROT 5.1*  --  5.5*  ALBUMIN 2.6* 3.1* 2.9*   No results for input(s): LIPASE, AMYLASE in the last 168 hours. Recent Labs  Lab 05/06/24 0053  AMMONIA 39*    ABG    Component Value Date/Time   PHART 7.371 05/06/2024 1110   PCO2ART 27.1 (L) 05/06/2024 1110   PO2ART 85 05/06/2024 1110   HCO3  15.8 (L) 05/06/2024 1110   TCO2 17 (L) 05/06/2024 1110   ACIDBASEDEF 9.0 (H) 05/06/2024 1110   O2SAT 99.2 05/07/2024 1123     Coagulation Profile: Recent Labs  Lab  05/06/24 0053 05/07/24 0310  INR 5.9* 3.2*    Cardiac Enzymes: No results for input(s): CKTOTAL, CKMB, CKMBINDEX, TROPONINI in the last 168 hours.  HbA1C: Hgb A1c MFr Bld  Date/Time Value Ref Range Status  05/06/2024 02:50 AM 5.6 4.8 - 5.6 % Final    Comment:    Repeated to verify  (NOTE) Diagnosis of Diabetes The following HbA1c ranges recommended by the American Diabetes Association (ADA) may be used as an aid in the diagnosis of diabetes mellitus.  Hemoglobin             Suggested A1C NGSP%              Diagnosis  <5.7                   Non Diabetic  5.7-6.4                Pre-Diabetic  >6.4                   Diabetic  <7.0                   Glycemic control for                       adults with diabetes.    08/27/2020 11:50 AM 5.9 (H) 4.8 - 5.6 % Final    Comment:    (NOTE) Pre diabetes:          5.7%-6.4%  Diabetes:              >6.4%  Glycemic control for   <7.0% adults with diabetes     CBG: Recent Labs  Lab 05/06/24 2004 05/06/24 2327 05/07/24 0330 05/07/24 0745 05/07/24 1129  GLUCAP 102* 121* 118* 121* 111*   CRITICAL CARE Performed by: Sammi JONETTA Fredericks.     Total critical care time: 60 minutes   Critical care time was exclusive of separately billable procedures and treating other patients.   Critical care was necessary to treat or prevent imminent or life-threatening deterioration.   Critical care was time spent personally by me on the following activities: development of treatment plan with patient and/or surrogate as well as nursing, discussions with consultants, evaluation of patient's response to treatment, examination of patient, obtaining history from patient or surrogate, ordering and performing treatments and interventions, ordering and review of laboratory  studies, ordering and review of radiographic studies, pulse oximetry, re-evaluation of patient's condition and participation in multidisciplinary rounds.  Sammi JONETTA Fredericks, MD Pulmonary, Critical Care and Sleep Attending.   05/07/2024, 12:46 PM           "

## 2024-05-08 ENCOUNTER — Ambulatory Visit: Payer: Self-pay | Admitting: Cardiology

## 2024-05-08 DIAGNOSIS — R6521 Severe sepsis with septic shock: Secondary | ICD-10-CM | POA: Diagnosis not present

## 2024-05-08 DIAGNOSIS — G928 Other toxic encephalopathy: Secondary | ICD-10-CM | POA: Diagnosis not present

## 2024-05-08 DIAGNOSIS — I4891 Unspecified atrial fibrillation: Secondary | ICD-10-CM

## 2024-05-08 DIAGNOSIS — N179 Acute kidney failure, unspecified: Secondary | ICD-10-CM | POA: Diagnosis not present

## 2024-05-08 DIAGNOSIS — I5023 Acute on chronic systolic (congestive) heart failure: Secondary | ICD-10-CM | POA: Diagnosis not present

## 2024-05-08 DIAGNOSIS — A419 Sepsis, unspecified organism: Secondary | ICD-10-CM

## 2024-05-08 DIAGNOSIS — K7200 Acute and subacute hepatic failure without coma: Secondary | ICD-10-CM | POA: Diagnosis not present

## 2024-05-08 DIAGNOSIS — Z8719 Personal history of other diseases of the digestive system: Secondary | ICD-10-CM | POA: Diagnosis not present

## 2024-05-08 DIAGNOSIS — K7290 Hepatic failure, unspecified without coma: Secondary | ICD-10-CM

## 2024-05-08 DIAGNOSIS — I513 Intracardiac thrombosis, not elsewhere classified: Secondary | ICD-10-CM | POA: Diagnosis not present

## 2024-05-08 DIAGNOSIS — J9601 Acute respiratory failure with hypoxia: Secondary | ICD-10-CM | POA: Diagnosis not present

## 2024-05-08 DIAGNOSIS — I4892 Unspecified atrial flutter: Secondary | ICD-10-CM

## 2024-05-08 DIAGNOSIS — D689 Coagulation defect, unspecified: Secondary | ICD-10-CM | POA: Diagnosis not present

## 2024-05-08 DIAGNOSIS — R748 Abnormal levels of other serum enzymes: Secondary | ICD-10-CM

## 2024-05-08 DIAGNOSIS — N189 Chronic kidney disease, unspecified: Secondary | ICD-10-CM

## 2024-05-08 LAB — RENAL FUNCTION PANEL
Albumin: 2.9 g/dL — ABNORMAL LOW (ref 3.5–5.0)
Anion gap: 11 (ref 5–15)
BUN: 14 mg/dL (ref 6–20)
CO2: 21 mmol/L — ABNORMAL LOW (ref 22–32)
Calcium: 7.9 mg/dL — ABNORMAL LOW (ref 8.9–10.3)
Chloride: 101 mmol/L (ref 98–111)
Creatinine, Ser: 1.41 mg/dL — ABNORMAL HIGH (ref 0.61–1.24)
GFR, Estimated: 57 mL/min — ABNORMAL LOW
Glucose, Bld: 120 mg/dL — ABNORMAL HIGH (ref 70–99)
Phosphorus: 2.2 mg/dL — ABNORMAL LOW (ref 2.5–4.6)
Potassium: 4.2 mmol/L (ref 3.5–5.1)
Sodium: 134 mmol/L — ABNORMAL LOW (ref 135–145)

## 2024-05-08 LAB — PROTIME-INR
INR: 2.2 — ABNORMAL HIGH (ref 0.8–1.2)
Prothrombin Time: 25.8 s — ABNORMAL HIGH (ref 11.4–15.2)

## 2024-05-08 LAB — HEMOGLOBIN AND HEMATOCRIT, BLOOD
HCT: 25.7 % — ABNORMAL LOW (ref 39.0–52.0)
HCT: 25.9 % — ABNORMAL LOW (ref 39.0–52.0)
HCT: 26.3 % — ABNORMAL LOW (ref 39.0–52.0)
Hemoglobin: 8.6 g/dL — ABNORMAL LOW (ref 13.0–17.0)
Hemoglobin: 8.4 g/dL — ABNORMAL LOW (ref 13.0–17.0)
Hemoglobin: 8.5 g/dL — ABNORMAL LOW (ref 13.0–17.0)

## 2024-05-08 LAB — CULTURE, BLOOD (ROUTINE X 2): Special Requests: ADEQUATE

## 2024-05-08 LAB — CBC
HCT: 25.4 % — ABNORMAL LOW (ref 39.0–52.0)
Hemoglobin: 8.4 g/dL — ABNORMAL LOW (ref 13.0–17.0)
MCH: 24.6 pg — ABNORMAL LOW (ref 26.0–34.0)
MCHC: 33.1 g/dL (ref 30.0–36.0)
MCV: 74.3 fL — ABNORMAL LOW (ref 80.0–100.0)
Platelets: 121 K/uL — ABNORMAL LOW (ref 150–400)
RBC: 3.42 MIL/uL — ABNORMAL LOW (ref 4.22–5.81)
RDW: 24 % — ABNORMAL HIGH (ref 11.5–15.5)
WBC: 16 K/uL — ABNORMAL HIGH (ref 4.0–10.5)
nRBC: 1.6 % — ABNORMAL HIGH (ref 0.0–0.2)

## 2024-05-08 LAB — BASIC METABOLIC PANEL WITH GFR
Anion gap: 11 (ref 5–15)
Anion gap: 11 (ref 5–15)
Anion gap: 11 (ref 5–15)
BUN: 15 mg/dL (ref 6–20)
BUN: 14 mg/dL (ref 6–20)
BUN: 14 mg/dL (ref 6–20)
CO2: 23 mmol/L (ref 22–32)
CO2: 21 mmol/L — ABNORMAL LOW (ref 22–32)
CO2: 22 mmol/L (ref 22–32)
Calcium: 7.8 mg/dL — ABNORMAL LOW (ref 8.9–10.3)
Calcium: 7.8 mg/dL — ABNORMAL LOW (ref 8.9–10.3)
Calcium: 7.9 mg/dL — ABNORMAL LOW (ref 8.9–10.3)
Chloride: 102 mmol/L (ref 98–111)
Chloride: 101 mmol/L (ref 98–111)
Chloride: 102 mmol/L (ref 98–111)
Creatinine, Ser: 1.42 mg/dL — ABNORMAL HIGH (ref 0.61–1.24)
Creatinine, Ser: 1.4 mg/dL — ABNORMAL HIGH (ref 0.61–1.24)
Creatinine, Ser: 1.47 mg/dL — ABNORMAL HIGH (ref 0.61–1.24)
GFR, Estimated: 57 mL/min — ABNORMAL LOW
GFR, Estimated: 58 mL/min — ABNORMAL LOW
GFR, Estimated: 54 mL/min — ABNORMAL LOW
Glucose, Bld: 100 mg/dL — ABNORMAL HIGH (ref 70–99)
Glucose, Bld: 120 mg/dL — ABNORMAL HIGH (ref 70–99)
Glucose, Bld: 123 mg/dL — ABNORMAL HIGH (ref 70–99)
Potassium: 4.4 mmol/L (ref 3.5–5.1)
Potassium: 4.1 mmol/L (ref 3.5–5.1)
Potassium: 4.6 mmol/L (ref 3.5–5.1)
Sodium: 136 mmol/L (ref 135–145)
Sodium: 133 mmol/L — ABNORMAL LOW (ref 135–145)
Sodium: 135 mmol/L (ref 135–145)

## 2024-05-08 LAB — COMPREHENSIVE METABOLIC PANEL WITH GFR
ALT: 630 U/L — ABNORMAL HIGH (ref 0–44)
AST: 1031 U/L — ABNORMAL HIGH (ref 15–41)
Albumin: 3 g/dL — ABNORMAL LOW (ref 3.5–5.0)
Alkaline Phosphatase: 177 U/L — ABNORMAL HIGH (ref 38–126)
Anion gap: 12 (ref 5–15)
BUN: 17 mg/dL (ref 6–20)
CO2: 21 mmol/L — ABNORMAL LOW (ref 22–32)
Calcium: 7.8 mg/dL — ABNORMAL LOW (ref 8.9–10.3)
Chloride: 102 mmol/L (ref 98–111)
Creatinine, Ser: 1.52 mg/dL — ABNORMAL HIGH (ref 0.61–1.24)
GFR, Estimated: 52 mL/min — ABNORMAL LOW
Glucose, Bld: 105 mg/dL — ABNORMAL HIGH (ref 70–99)
Potassium: 4.1 mmol/L (ref 3.5–5.1)
Sodium: 135 mmol/L (ref 135–145)
Total Bilirubin: 11.4 mg/dL — ABNORMAL HIGH (ref 0.0–1.2)
Total Protein: 5.7 g/dL — ABNORMAL LOW (ref 6.5–8.1)

## 2024-05-08 LAB — GLUCOSE, CAPILLARY
Glucose-Capillary: 103 mg/dL — ABNORMAL HIGH (ref 70–99)
Glucose-Capillary: 99 mg/dL (ref 70–99)
Glucose-Capillary: 96 mg/dL (ref 70–99)
Glucose-Capillary: 95 mg/dL (ref 70–99)
Glucose-Capillary: 116 mg/dL — ABNORMAL HIGH (ref 70–99)
Glucose-Capillary: 116 mg/dL — ABNORMAL HIGH (ref 70–99)

## 2024-05-08 LAB — APTT: aPTT: 55 s — ABNORMAL HIGH (ref 24–36)

## 2024-05-08 LAB — TROPONIN T, HIGH SENSITIVITY
Troponin T High Sensitivity: 150 ng/L (ref 0–19)
Troponin T High Sensitivity: 157 ng/L (ref 0–19)

## 2024-05-08 LAB — MAGNESIUM: Magnesium: 2 mg/dL (ref 1.7–2.4)

## 2024-05-08 LAB — PHOSPHORUS: Phosphorus: 2.7 mg/dL (ref 2.5–4.6)

## 2024-05-08 MED ORDER — HEPARIN SODIUM (PORCINE) 5000 UNIT/ML IJ SOLN: 5000.0000 [IU] | Freq: Three times a day (TID) | INTRAMUSCULAR | Status: DC

## 2024-05-08 MED ORDER — AMIODARONE HCL IN DEXTROSE 360-4.14 MG/200ML-% IV SOLN
60.0000 mg/h | INTRAVENOUS | Status: AC
  Administered 2024-05-08 (×2): 60 mg/h via INTRAVENOUS
  Filled 2024-05-08 (×2): qty 200

## 2024-05-08 MED ORDER — CHLORHEXIDINE GLUCONATE CLOTH 2 % EX PADS
6.0000 | MEDICATED_PAD | CUTANEOUS | Status: DC
  Administered 2024-05-09 – 2024-05-10 (×2): 6 via TOPICAL

## 2024-05-08 MED ORDER — AMIODARONE HCL IN DEXTROSE 360-4.14 MG/200ML-% IV SOLN
30.0000 mg/h | INTRAVENOUS | Status: DC
  Administered 2024-05-09 – 2024-05-11 (×5): 30 mg/h via INTRAVENOUS
  Filled 2024-05-08 (×7): qty 200

## 2024-05-08 MED ORDER — SODIUM PHOSPHATES 45 MMOLE/15ML IV SOLN
15.0000 mmol | Freq: Once | INTRAVENOUS | Status: AC
  Administered 2024-05-08: 15 mmol via INTRAVENOUS
  Filled 2024-05-08: qty 5

## 2024-05-08 NOTE — Plan of Care (Signed)
   Problem: Fluid Volume: Goal: Ability to maintain a balanced intake and output will improve Outcome: Progressing   Problem: Health Behavior/Discharge Planning: Goal: Ability to manage health-related needs will improve Outcome: Progressing   Problem: Metabolic: Goal: Ability to maintain appropriate glucose levels will improve Outcome: Progressing

## 2024-05-08 NOTE — Progress Notes (Signed)
 eLink Physician-Brief Progress Note Patient Name: Cody Guzman DOB: December 09, 1965 MRN: [id number]   Date of Service  05/08/2024  HPI/Events of Note  Midline and left upper extremity running acetylcysteine  had suspected extravasation with blistering  eICU Interventions  Soft tissue ultrasound of the left upper extremity.  Cessation of the drip     Intervention Category Minor Interventions: Routine modifications to care plan (e.g. PRN medications for pain, fever)  Rhone Ozaki 05/08/2024, 10:58 PM

## 2024-05-08 NOTE — Progress Notes (Signed)
 Saline locked LUE midline @ 2102. Acetylcysteine  moved to 20g RAC. Pharmacy called and updated as well as Elink RN and Physician. Acetylcysteine  is not a known extravagant per pharmacy. Continuing to monitor patient.

## 2024-05-08 NOTE — Consult Note (Signed)
 "   Advanced Heart Failure Team Consult Note   Primary Physician: Theotis Haze ORN, NP Cardiologist:  Vina Gull, MD  Reason for Consultation: Shock  HPI:    Cody Guzman is seen today for evaluation of shock at the request of Dr. Kriste.   Patient with an extensive past medical history significant for non-Hodgkin's lymphoma, coronary artery disease with MI in 2014, atrial flutter/A-fib, CVA, cirrhosis and chronic biventricular heart failure with prior CRT-D placement.  Presented initially with GI bleed, found to be in multisystem organ failure.  Lactic acid was elevated to 13, sodium severely reduced at 125, creatinine of 5.4, potassium of 7.2, and shock liver.  INR was elevated to 5.9.  Echocardiogram showed severely reduced ejection fraction, severe RV dysfunction with severe lead related TR.  Also with LV thrombus. unclear etiology of decompensation, blood cultures drawn but only with staph epi, likely contaminant.   Objective:    Vital Signs:   Temp:  [97.7 F (36.5 C)-98.5 F (36.9 C)] 98.4 F (36.9 C) (01/04 1528) Pulse Rate:  [60-122] 103 (01/04 1445) Resp:  [16-31] 27 (01/04 1515) BP: (89-90)/(62-64) 89/64 (01/03 2000) SpO2:  [97 %-100 %] 100 % (01/04 1445) Arterial Line BP: (86-129)/(44-60) 108/50 (01/04 1515) Weight:  [84.6 kg] 84.6 kg (01/04 0459) Last BM Date :  (PTA)  Weight change: Filed Weights   05/06/24 1000 05/08/24 0459  Weight: 88.4 kg 84.6 kg    Intake/Output:   Intake/Output Summary (Last 24 hours) at 05/08/2024 1546 Last data filed at 05/08/2024 1500 Gross per 24 hour  Intake 2698.08 ml  Output 5994.3 ml  Net -3296.22 ml      Physical Exam    GENERAL: Ill-appearing PULM: Mildly increased work of breathing CARDIAC:  JVP: Flat         Irregular rate and rhythm. No murmurs, rubs or gallops.  Trace edema. Warm and well perfused extremities. ABDOMEN: Soft, non-tender, non-distended. NEUROLOGIC: Patient is oriented x3 with no focal or  lateralizing neurologic deficits.    Telemetry   Atrial fibrillation with occasional paced beats   Labs   Basic Metabolic Panel: Recent Labs  Lab 05/06/24 0433 05/06/24 1101 05/07/24 0310 05/07/24 1708 05/07/24 2311 05/08/24 0458 05/08/24 1002  NA 128*   < > 133* 134* 133* 135 136  K 6.3*   < > 4.5 4.0 4.0 4.1 4.4  CL 89*   < > 99 101 101 102 102  CO2 12*   < > 23 23 21* 21* 23  GLUCOSE 105*   < > 138* 108* 106* 105* 100*  BUN 42*   < > 29* 22* 19 17 15   CREATININE 4.79*   < > 3.11* 2.01* 1.74* 1.52* 1.42*  CALCIUM  9.9   < > 8.8* 8.0* 7.7* 7.8* 7.8*  MG 2.3  --  1.7  --   --  2.0  --   PHOS 5.3*  --   --  1.9*  --  2.7  --    < > = values in this interval not displayed.    Liver Function Tests: Recent Labs  Lab 05/06/24 0053 05/06/24 0433 05/07/24 0310 05/07/24 1708 05/08/24 0458  AST 1,885*  --  1,448*  --  1,031*  ALT 572*  --  665*  --  630*  ALKPHOS 136*  --  157*  --  177*  BILITOT 6.0*  --  9.0*  --  11.4*  PROT 5.1*  --  5.5*  --  5.7*  ALBUMIN  2.6* 3.1* 2.9* 3.0* 3.0*   No results for input(s): LIPASE, AMYLASE in the last 168 hours. Recent Labs  Lab 05/06/24 0053  AMMONIA 39*    CBC: Recent Labs  Lab 05/06/24 0250 05/06/24 1101 05/07/24 0310 05/07/24 1100 05/07/24 1615 05/07/24 2311 05/08/24 0458 05/08/24 1121  WBC 15.8*  --  13.9*  --   --   --  16.0*  --   NEUTROABS 13.7*  --   --   --   --   --   --   --   HGB 7.2*   < > 7.7* 7.6* 7.9* 8.0* 8.4* 8.6*  HCT 23.6*   < > 23.1* 23.0* 24.0* 24.1* 25.4* 25.7*  MCV 79.2*  --  75.0*  --   --   --  74.3*  --   PLT 217  --  151  --   --   --  121*  --    < > = values in this interval not displayed.    Cardiac Enzymes: No results for input(s): CKTOTAL, CKMB, CKMBINDEX, TROPONINI in the last 168 hours.  BNP: BNP (last 3 results) Recent Labs    01/16/24 0512 03/09/24 2139 03/14/24 1848  BNP 812.0* 564.3* 1,158.9*    ProBNP (last 3 results) Recent Labs    05/06/24 1101   PROBNP 12,378.0*     CBG: Recent Labs  Lab 05/07/24 2326 05/08/24 0329 05/08/24 0753 05/08/24 1124 05/08/24 1529  GLUCAP 105* 103* 99 96 95    Coagulation Studies: Recent Labs    05/06/24 0053 05/07/24 0310 05/08/24 0458  LABPROT 55.3* 34.1* 25.8*  INR 5.9* 3.2* 2.2*     Medications:     Current Medications:  Chlorhexidine  Gluconate Cloth  6 each Topical Daily   insulin  aspart  0-6 Units Subcutaneous Q4H   topiramate   25 mg Oral Daily    Infusions:  acetylcysteine  6.25 mg/kg/hr (05/08/24 1500)   amiodarone  60 mg/hr (05/08/24 1500)   amiodarone      ceFEPime  (MAXIPIME ) IV Stopped (05/08/24 1031)   dexmedetomidine  (PRECEDEX ) IV infusion 0.2 mcg/kg/hr (05/08/24 1500)   heparin  10,000 units/ 20 mL infusion syringe 500 Units/hr (05/08/24 0454)   metronidazole  Stopped (05/08/24 1047)   norepinephrine  (LEVOPHED ) Adult infusion 18 mcg/min (05/08/24 1500)   octreotide  (SANDOSTATIN ) 500 mcg in sodium chloride  0.9 % 250 mL (2 mcg/mL) infusion 50 mcg/hr (05/08/24 1500)   prismasol  BGK 4/2.5 400 mL/hr at 05/08/24 1041   prismasol  BGK 4/2.5 1,500 mL/hr at 05/08/24 1235   prismasol  BGK 4/2.5 400 mL/hr at 05/08/24 1041   vasopressin  0.04 Units/min (05/08/24 1500)      Patient Profile   Patient with an extensive past medical history significant for non-Hodgkin's lymphoma, coronary artery disease with MI in 2014, atrial flutter/A-fib, CVA, cirrhosis and chronic biventricular heart failure with prior CRT-D placement who presents with shock and MSOF.  Assessment/Plan   Shock: Suspect largely distributive shock given exam, elevated coox, wide pulse pressure also with history of suspected cirrhosis. His atrial fibrillation is likely contributing to cardiac dysfunction (only 26% pacing given AF burden).  - Start amiodarone  to potentially rate control afib - May be able to increase pacing rate if difficulty coming off pressors - Continue NE and vaso for now, no indication for  inotropes and will likely worsen rate - Agree with keeping negative, but does not appear overtly volume overloaded  LV thrombus: Multiple noted, unfortunately with GI bleeding holding anticoagulation. - Anticoagulation when able  Afib: Rate controlled currently, occasional  BiV pacing. Not a candidate for cardioversion currently with bleeding. - grade I varices, would need GI clearance prior to TEE if bleeding slowed - Restart IV amiodarone  - Long term AVN ablation may be beneficial, but given renal/liver failure may not be a candidate  ARF:  - On CRRT, nephrology following - Continue to pull judicious fluid  Acute liver failure:  - On octreodide, NAC, GI following - Off PPI  Difficult case, not a candidate for advanced therapies and certainly mixed shock at the moment. Palliative care may be beneficial if he does not improve  CRITICAL CARE Performed by: [name]   Total critical care time: 35 minutes  Critical care time was exclusive of separately billable procedures and treating other patients.  Critical care was necessary to treat or prevent imminent or life-threatening deterioration.  Critical care was time spent personally by me on the following activities: development of treatment plan with patient and/or surrogate as well as nursing, discussions with consultants, evaluation of patient's response to treatment, examination of patient, obtaining history from patient or surrogate, ordering and performing treatments and interventions, ordering and review of laboratory studies, ordering and review of radiographic studies, pulse oximetry and re-evaluation of patient's condition.   Length of Stay: 2  [name], MD  05/08/2024, 3:46 PM    Advanced Heart Failure Team Pager [phone] (M-F; 7a - 5p)  Please contact [facility] for night-coverage after hours (4p -7a ) and weekends on [email or web address]  "

## 2024-05-08 NOTE — Progress Notes (Addendum)
 [facility] Progress Note  Chief Complaint: Ischemic hepatopathy and reported GI bleeding  History:  This patient remains clinically ill in the ICU.  He is not on a ventilator but he remains quite somnolent.  Still on vasopressor support, has some urine output and is on CRRT. Nursing reported to us  there has been no overt GI bleeding such as melena or hematochezia since admission. ROS:  Objective:  Current Medications[1]   acetylcysteine  6.25 mg/kg/hr (05/08/24 1400)   amiodarone  60 mg/hr (05/08/24 1400)   amiodarone      ceFEPime  (MAXIPIME ) IV Stopped (05/08/24 1031)   dexmedetomidine  (PRECEDEX ) IV infusion 0.2 mcg/kg/hr (05/08/24 1400)   heparin  10,000 units/ 20 mL infusion syringe 500 Units/hr (05/08/24 0454)   metronidazole  Stopped (05/08/24 1047)   norepinephrine  (LEVOPHED ) Adult infusion 18 mcg/min (05/08/24 1400)   octreotide  (SANDOSTATIN ) 500 mcg in sodium chloride  0.9 % 250 mL (2 mcg/mL) infusion 50 mcg/hr (05/08/24 1400)   prismasol  BGK 4/2.5 400 mL/hr at 05/08/24 1041   prismasol  BGK 4/2.5 1,500 mL/hr at 05/08/24 1235   prismasol  BGK 4/2.5 400 mL/hr at 05/08/24 1041   vasopressin  0.04 Units/min (05/08/24 1400)     Vital signs in last 24 hrs: Vitals:   05/08/24 1245 05/08/24 1300  BP:    Pulse:    Resp: (!) 23 20  Temp:    SpO2: 100%     Intake/Output Summary (Last 24 hours) at 05/08/2024 1421 Last data filed at 05/08/2024 1400 Gross per 24 hour  Intake 2660.46 ml  Output 5949.7 ml  Net -3289.24 ml     Physical Exam Acutely ill-appearing, stuporous, maintaining airway HEENT: sclera anicteric, oral mucosa without lesions Neck: supple, no thyromegaly, JVD or lymphadenopathy Cardiac: RRR without murmurs, S1S2 heard, + peripheral edema + JVD Pulm: clear to auscultation bilaterally, normal RR and effort noted Abdomen: soft, no apparent tenderness, with active bowel sounds. Skin; warm and dry, no jaundice  Recent Labs:     Latest Ref Rng & Units 05/08/2024    11:21 AM 05/08/2024    4:58 AM 05/07/2024   11:11 PM  CBC  WBC 4.0 - 10.5 K/uL  16.0    Hemoglobin 13.0 - 17.0 g/dL 8.6  8.4  8.0   Hematocrit 39.0 - 52.0 % 25.7  25.4  24.1   Platelets 150 - 400 K/uL  121      Recent Labs  Lab 05/08/24 0458  INR 2.2*      Latest Ref Rng & Units 05/08/2024   10:02 AM 05/08/2024    4:58 AM 05/07/2024   11:11 PM  CMP  Glucose 70 - 99 mg/dL 899  894  893   BUN 6 - 20 mg/dL 15  17  19    Creatinine 0.61 - 1.24 mg/dL 8.57  8.47  8.25   Sodium 135 - 145 mmol/L 136  135  133   Potassium 3.5 - 5.1 mmol/L 4.4  4.1  4.0   Chloride 98 - 111 mmol/L 102  102  101   CO2 22 - 32 mmol/L 23  21  21    Calcium  8.9 - 10.3 mg/dL 7.8  7.8  7.7   Total Protein 6.5 - 8.1 g/dL  5.7    Total Bilirubin 0.0 - 1.2 mg/dL  88.5    Alkaline Phos 38 - 126 U/L  177    AST 15 - 41 U/L  1,031    ALT 0 - 44 U/L  630       Radiologic studies:  Assessment & Plan  Assessment:  Elevated LFTs and coagulopathy from  ischemic hepatopathy (shock liver) due to septic shock and underlying biventricular heart failure Transaminases improving, INR coming down as well.  - Stop NAC afterwards 3-day course  Reported hematochezia or other GI bleeding at her prior to admission, none witnessed while here, hemoglobin stable. No current plans for endoscopic testing given his overall critical condition.  Will check hepatic function panel and INR tomorrow  Inpatient GI service signing off-please call the service back if this patient develops overt GI bleeding.  Victory LITTIE Brand III Office: [phone]     [1]  Current Facility-Administered Medications:    [COMPLETED] acetylcysteine  (ACETADOTE ) 30.5 mg/mL load via infusion 13,260 mg, 150 mg/kg, Intravenous, Once, 13,260 mg at 05/06/24 2135 **FOLLOWED BY** [EXPIRED] acetylcysteine  (ACETADOTE ) 18,000 mg in dextrose  5 % 590 mL (30.5085 mg/mL) infusion, 12.5 mg/kg/hr, Intravenous, Continuous, Stopping previously hung infusion at 05/07/24 2015  **FOLLOWED BY** acetylcysteine  (ACETADOTE ) 18,000 mg in dextrose  5 % 590 mL (30.5085 mg/mL) infusion, 6.25 mg/kg/hr, Intravenous, Continuous, Pawar, Rahul, MD, Last Rate: 18.11 mL/hr at 05/08/24 1400, 6.25 mg/kg/hr at 05/08/24 1400   amiodarone  (NEXTERONE  PREMIX) 360-4.14 MG/200ML-% (1.8 mg/mL) IV infusion, 60 mg/hr, Intravenous, Continuous, Zenaida Morene PARAS, MD, Last Rate: 33.3 mL/hr at 05/08/24 1400, 60 mg/hr at 05/08/24 1400   amiodarone  (NEXTERONE  PREMIX) 360-4.14 MG/200ML-% (1.8 mg/mL) IV infusion, 30 mg/hr, Intravenous, Continuous, Zenaida Morene PARAS, MD   ceFEPIme  (MAXIPIME ) 2 g in sodium chloride  0.9 % 100 mL IVPB, 2 g, Intravenous, Q12H, Hershal Vito MATSU, RPH, Stopped at 05/08/24 1031   Chlorhexidine  Gluconate Cloth 2 % PADS 6 each, 6 each, Topical, Daily, Maree Harder, MD, 6 each at 05/08/24 0117   dexmedetomidine  (PRECEDEX ) 400 MCG/100ML (4 mcg/mL) infusion, 0-1.2 mcg/kg/hr, Intravenous, Titrated, Kassie Acquanetta Bradley, MD, Last Rate: 4.42 mL/hr at 05/08/24 1400, 0.2 mcg/kg/hr at 05/08/24 1400   heparin  10,000 units/ 20 mL infusion syringe, 500-1,000 Units/hr, CRRT, Continuous, Norine Manuelita LABOR, MD, Last Rate: 1 mL/hr at 05/08/24 0454, 500 Units/hr at 05/08/24 0454   heparin  injection 1,000-6,000 Units, 1,000-6,000 Units, CRRT, PRN, Marlee Bernardino NOVAK, MD   insulin  aspart (novoLOG ) injection 0-6 Units, 0-6 Units, Subcutaneous, Q4H, Maree Harder, MD   metroNIDAZOLE  (FLAGYL ) IVPB 500 mg, 500 mg, Intravenous, Q12H, Wilson, Tara N, PA-C, Stopped at 05/08/24 1047   norepinephrine  (LEVOPHED ) 16 mg in (0.064 mg/mL) premix infusion, 0-40 mcg/min, Intravenous, Titrated, Pawar, Rahul, MD, Last Rate: 16.88 mL/hr at 05/08/24 1400, 18 mcg/min at 05/08/24 1400   [COMPLETED] octreotide  (SANDOSTATIN ) 2 mcg/mL load via infusion 50 mcg, 50 mcg, Intravenous, Once, 50 mcg at 05/06/24 0115 **AND** octreotide  (SANDOSTATIN ) 500 mcg in sodium chloride  0.9 % 250 mL (2 mcg/mL) infusion, 50 mcg/hr, Intravenous,  Continuous, Midge Golas, MD, Last Rate: 25 mL/hr at 05/08/24 1400, 50 mcg/hr at 05/08/24 1400   ondansetron  (ZOFRAN ) injection 4 mg, 4 mg, Intravenous, Q6H PRN, Maree Harder, MD   Oral care mouth rinse, 15 mL, Mouth Rinse, PRN, Pawar, Rahul, MD   prismasol  BGK 4/2.5 infusion, , CRRT, Continuous, Norine Manuelita LABOR, MD, Last Rate: 400 mL/hr at 05/08/24 1041, New Bag at 05/08/24 1041   prismasol  BGK 4/2.5 infusion, , CRRT, Continuous, Norine Manuelita LABOR, MD, Last Rate: 1,500 mL/hr at 05/08/24 1235, New Bag at 05/08/24 1235   prismasol  BGK 4/2.5 infusion, , CRRT, Continuous, Norine Manuelita LABOR, MD, Last Rate: 400 mL/hr at 05/08/24 1041, New Bag at 05/08/24 1041   sodium chloride  flush (NS) 0.9 % injection 10-40 mL,  10-40 mL, Intracatheter, PRN, Midge Golas, MD   topiramate  (TOPAMAX ) tablet 25 mg, 25 mg, Oral, Daily, Tanda, Tara N, PA-C   vasopressin  (PITRESSIN) 20 Units in 100 mL (0.2 unit/mL) infusion-*FOR SHOCK*, 0.04 Units/min, Intravenous, Continuous, Pawar, Rahul, MD, Last Rate: 12 mL/hr at 05/08/24 1400, 0.04 Units/min at 05/08/24 1400

## 2024-05-08 NOTE — H&P (View-Only) (Signed)
 "   Advanced Heart Failure Team Consult Note   Primary Physician: Theotis Haze ORN, NP Cardiologist:  Vina Gull, MD  Reason for Consultation: Shock  HPI:    Cody Guzman is seen today for evaluation of shock at the request of Dr. Kriste.   Patient with an extensive past medical history significant for non-Hodgkin's lymphoma, coronary artery disease with MI in 2014, atrial flutter/A-fib, CVA, cirrhosis and chronic biventricular heart failure with prior CRT-D placement.  Presented initially with GI bleed, found to be in multisystem organ failure.  Lactic acid was elevated to 13, sodium severely reduced at 125, creatinine of 5.4, potassium of 7.2, and shock liver.  INR was elevated to 5.9.  Echocardiogram showed severely reduced ejection fraction, severe RV dysfunction with severe lead related TR.  Also with LV thrombus. unclear etiology of decompensation, blood cultures drawn but only with staph epi, likely contaminant.   Objective:    Vital Signs:   Temp:  [97.7 F (36.5 C)-98.5 F (36.9 C)] 98.4 F (36.9 C) (01/04 1528) Pulse Rate:  [60-122] 103 (01/04 1445) Resp:  [16-31] 27 (01/04 1515) BP: (89-90)/(62-64) 89/64 (01/03 2000) SpO2:  [97 %-100 %] 100 % (01/04 1445) Arterial Line BP: (86-129)/(44-60) 108/50 (01/04 1515) Weight:  [84.6 kg] 84.6 kg (01/04 0459) Last BM Date :  (PTA)  Weight change: Filed Weights   05/06/24 1000 05/08/24 0459  Weight: 88.4 kg 84.6 kg    Intake/Output:   Intake/Output Summary (Last 24 hours) at 05/08/2024 1546 Last data filed at 05/08/2024 1500 Gross per 24 hour  Intake 2698.08 ml  Output 5994.3 ml  Net -3296.22 ml      Physical Exam    GENERAL: Ill-appearing PULM: Mildly increased work of breathing CARDIAC:  JVP: Flat         Irregular rate and rhythm. No murmurs, rubs or gallops.  Trace edema. Warm and well perfused extremities. ABDOMEN: Soft, non-tender, non-distended. NEUROLOGIC: Patient is oriented x3 with no focal or  lateralizing neurologic deficits.    Telemetry   Atrial fibrillation with occasional paced beats   Labs   Basic Metabolic Panel: Recent Labs  Lab 05/06/24 0433 05/06/24 1101 05/07/24 0310 05/07/24 1708 05/07/24 2311 05/08/24 0458 05/08/24 1002  NA 128*   < > 133* 134* 133* 135 136  K 6.3*   < > 4.5 4.0 4.0 4.1 4.4  CL 89*   < > 99 101 101 102 102  CO2 12*   < > 23 23 21* 21* 23  GLUCOSE 105*   < > 138* 108* 106* 105* 100*  BUN 42*   < > 29* 22* 19 17 15   CREATININE 4.79*   < > 3.11* 2.01* 1.74* 1.52* 1.42*  CALCIUM  9.9   < > 8.8* 8.0* 7.7* 7.8* 7.8*  MG 2.3  --  1.7  --   --  2.0  --   PHOS 5.3*  --   --  1.9*  --  2.7  --    < > = values in this interval not displayed.    Liver Function Tests: Recent Labs  Lab 05/06/24 0053 05/06/24 0433 05/07/24 0310 05/07/24 1708 05/08/24 0458  AST 1,885*  --  1,448*  --  1,031*  ALT 572*  --  665*  --  630*  ALKPHOS 136*  --  157*  --  177*  BILITOT 6.0*  --  9.0*  --  11.4*  PROT 5.1*  --  5.5*  --  5.7*  ALBUMIN  2.6* 3.1* 2.9* 3.0* 3.0*   No results for input(s): LIPASE, AMYLASE in the last 168 hours. Recent Labs  Lab 05/06/24 0053  AMMONIA 39*    CBC: Recent Labs  Lab 05/06/24 0250 05/06/24 1101 05/07/24 0310 05/07/24 1100 05/07/24 1615 05/07/24 2311 05/08/24 0458 05/08/24 1121  WBC 15.8*  --  13.9*  --   --   --  16.0*  --   NEUTROABS 13.7*  --   --   --   --   --   --   --   HGB 7.2*   < > 7.7* 7.6* 7.9* 8.0* 8.4* 8.6*  HCT 23.6*   < > 23.1* 23.0* 24.0* 24.1* 25.4* 25.7*  MCV 79.2*  --  75.0*  --   --   --  74.3*  --   PLT 217  --  151  --   --   --  121*  --    < > = values in this interval not displayed.    Cardiac Enzymes: No results for input(s): CKTOTAL, CKMB, CKMBINDEX, TROPONINI in the last 168 hours.  BNP: BNP (last 3 results) Recent Labs    01/16/24 0512 03/09/24 2139 03/14/24 1848  BNP 812.0* 564.3* 1,158.9*    ProBNP (last 3 results) Recent Labs    05/06/24 1101   PROBNP 12,378.0*     CBG: Recent Labs  Lab 05/07/24 2326 05/08/24 0329 05/08/24 0753 05/08/24 1124 05/08/24 1529  GLUCAP 105* 103* 99 96 95    Coagulation Studies: Recent Labs    05/06/24 0053 05/07/24 0310 05/08/24 0458  LABPROT 55.3* 34.1* 25.8*  INR 5.9* 3.2* 2.2*     Medications:     Current Medications:  Chlorhexidine  Gluconate Cloth  6 each Topical Daily   insulin  aspart  0-6 Units Subcutaneous Q4H   topiramate   25 mg Oral Daily    Infusions:  acetylcysteine  6.25 mg/kg/hr (05/08/24 1500)   amiodarone  60 mg/hr (05/08/24 1500)   amiodarone      ceFEPime  (MAXIPIME ) IV Stopped (05/08/24 1031)   dexmedetomidine  (PRECEDEX ) IV infusion 0.2 mcg/kg/hr (05/08/24 1500)   heparin  10,000 units/ 20 mL infusion syringe 500 Units/hr (05/08/24 0454)   metronidazole  Stopped (05/08/24 1047)   norepinephrine  (LEVOPHED ) Adult infusion 18 mcg/min (05/08/24 1500)   octreotide  (SANDOSTATIN ) 500 mcg in sodium chloride  0.9 % 250 mL (2 mcg/mL) infusion 50 mcg/hr (05/08/24 1500)   prismasol  BGK 4/2.5 400 mL/hr at 05/08/24 1041   prismasol  BGK 4/2.5 1,500 mL/hr at 05/08/24 1235   prismasol  BGK 4/2.5 400 mL/hr at 05/08/24 1041   vasopressin  0.04 Units/min (05/08/24 1500)      Patient Profile   Patient with an extensive past medical history significant for non-Hodgkin's lymphoma, coronary artery disease with MI in 2014, atrial flutter/A-fib, CVA, cirrhosis and chronic biventricular heart failure with prior CRT-D placement who presents with shock and MSOF.  Assessment/Plan   Shock: Suspect largely distributive shock given exam, elevated coox, wide pulse pressure also with history of suspected cirrhosis. His atrial fibrillation is likely contributing to cardiac dysfunction (only 26% pacing given AF burden).  - Start amiodarone  to potentially rate control afib - May be able to increase pacing rate if difficulty coming off pressors - Continue NE and vaso for now, no indication for  inotropes and will likely worsen rate - Agree with keeping negative, but does not appear overtly volume overloaded  LV thrombus: Multiple noted, unfortunately with GI bleeding holding anticoagulation. - Anticoagulation when able  Afib: Rate controlled currently, occasional  BiV pacing. Not a candidate for cardioversion currently with bleeding. - grade I varices, would need GI clearance prior to TEE if bleeding slowed - Restart IV amiodarone  - Long term AVN ablation may be beneficial, but given renal/liver failure may not be a candidate  ARF:  - On CRRT, nephrology following - Continue to pull judicious fluid  Acute liver failure:  - On octreodide, NAC, GI following - Off PPI  Difficult case, not a candidate for advanced therapies and certainly mixed shock at the moment. Palliative care may be beneficial if he does not improve  CRITICAL CARE Performed by: [name]   Total critical care time: 35 minutes  Critical care time was exclusive of separately billable procedures and treating other patients.  Critical care was necessary to treat or prevent imminent or life-threatening deterioration.  Critical care was time spent personally by me on the following activities: development of treatment plan with patient and/or surrogate as well as nursing, discussions with consultants, evaluation of patient's response to treatment, examination of patient, obtaining history from patient or surrogate, ordering and performing treatments and interventions, ordering and review of laboratory studies, ordering and review of radiographic studies, pulse oximetry and re-evaluation of patient's condition.   Length of Stay: 2  [name], MD  05/08/2024, 3:46 PM    Advanced Heart Failure Team Pager [phone] (M-F; 7a - 5p)  Please contact [facility] for night-coverage after hours (4p -7a ) and weekends on [email or web address]  "

## 2024-05-08 NOTE — Progress Notes (Addendum)
 "  NAMEAboubacar Guzman, MRN:  [id number], DOB:  [birth year], LOS: 2 ADMISSION DATE:  05/05/2024, CONSULTATION DATE:  05/06/24 REFERRING MD:  Nancyann Kin, MD, CHIEF COMPLAINT:  rectal bleed  History of Present Illness:  History obtained via chart review patient is encephalopathic   59  year old male history of chronic HFrEF s/p ICD, HTN, BPAD, history of GI bleed, history of esophageal varices, and CKD stage III who presented to the ED with progressive altered mental status and 3 days of blood in stool. Initial evaluation was concerning for wide complex tachyarrhythmia and he was given calcium . He was also found to be hypothermic, hypoglycemic and hypotensive with acute renal failure. He was started on levophed  and a trialysis line was placed.   Pertinent  Medical History  CAD, Cardiomyopathy s/p defibrillator, CKD, HTN, neuropathy, Atrial fib/flutter, Anemia, CHF, RBBB  Significant Hospital Events: Including procedures, antibiotic start and stop dates in addition to other pertinent events   1/2: admit to ICU on NE/vaso, CRRT started Aggressive fluid removal.  Continues on pressors with varying requirements  Interim History / Subjective:  No overnight events.  Groggy and lethargic on Precedex   Objective    Blood pressure (!) 89/64, pulse 91, temperature 98.1 F (36.7 C), temperature source Axillary, resp. rate (!) 23, weight 84.6 kg, SpO2 100%.        Intake/Output Summary (Last 24 hours) at 05/08/2024 0920 Last data filed at 05/08/2024 0900 Gross per 24 hour  Intake 2667.02 ml  Output 5486.3 ml  Net -2819.28 ml   Filed Weights   05/06/24 1000 05/08/24 0459  Weight: 88.4 kg 84.6 kg    Examination: General: Ill-appearing male, not in acute distress, lethargic Lungs: Clear to auscultation Heart: RRR, S1, S2 Abdomen: Soft, nontender, nondistended Neuro: Alert but confused.  Moving all extremities.  Resolved problem list  Wide complex tachyaryhtmia  Assessment and Plan    Neuro #Toxic metabolic encephalopathy  Likely in the setting of shock and acidosis   Pulm #Acute hypoxic respiratory failure In the setting of pulmonary edema   Cardiac/Vascular  #Shock most likely Cardiogenic. Possible septic, possible hemorrhagic although less likely  #Acute on chronic systolic heart failure exacerbation with biventricular failure  #A.fib/flutter  #LV thrombus  - Hold amiodarone  and digoxin   - appreciate cardiology recs  - Recent COOX not suggestive of pure cardiogenic shock, exam with warm extremities. Repeat COOX today - Continue fluid removal with CRRT  - Not a candidate for [facility]  - Appreciate palliative care recs  - Vasopressors titrated to a MAP of 65. Currently on vaso and levo   GI #Acute liver failure #Hx of cirrhosis #Variceal bleeding vs LGI bleeding  - Currently on empiric NAC and octreotide , will discuss with GI stopping  - Hb has been stable without any evidence of further bleeding  Diet: NPO GI PPX: Stop PPI   ID Sepsis with staph epi in blood cultures (likely a contaminant). No clear source of infection Urine culture negative Pneumonia less likely Covered empirically with vanc, cefepime  and flagyl . MRSA negative stop vanc     Renal  #AKI on CKD  Now on CRRT. Pulling fluid as tolerated   Endo BS within goal   Heme/Onc DVT ppx : Heparin  through CRRT    MSK/other      Patient Lines/Drains/Airways Status     Active Line/Drains/Airways     Name Placement date Placement time Site Days   Peripheral IV 05/05/24 22 G Anterior;Left 05/05/24  --  --  3   Peripheral IV 05/07/24 20 G Right Antecubital 05/07/24  2051  Antecubital  1   Hemodialysis Catheter Left Femoral vein Triple lumen Temporary (Non-Tunneled) 05/06/24  0221  Femoral vein  2   Midline Single Lumen 05/06/24 Left Basilic 8 cm 0 cm 05/06/24  0105  Basilic  2   Urethral Catheter Izetta MATSU, RN Latex 16 Fr. 05/06/24  0309  Latex  2   Wound 05/06/24 1000 Other (Comment)  Abdomen Left;Upper 05/06/24  1000  Abdomen  2             Labs   CBC: Recent Labs  Lab 05/06/24 0250 05/06/24 1101 05/07/24 0310 05/07/24 1100 05/07/24 1615 05/07/24 2311 05/08/24 0458  WBC 15.8*  --  13.9*  --   --   --  16.0*  NEUTROABS 13.7*  --   --   --   --   --   --   HGB 7.2*   < > 7.7* 7.6* 7.9* 8.0* 8.4*  HCT 23.6*   < > 23.1* 23.0* 24.0* 24.1* 25.4*  MCV 79.2*  --  75.0*  --   --   --  74.3*  PLT 217  --  151  --   --   --  121*   < > = values in this interval not displayed.    Basic Metabolic Panel: Recent Labs  Lab 05/06/24 0433 05/06/24 1101 05/06/24 2022 05/07/24 0310 05/07/24 1708 05/07/24 2311 05/08/24 0458  NA 128*   < > 131* 133* 134* 133* 135  K 6.3*   < > 5.1 4.5 4.0 4.0 4.1  CL 89*   < > 96* 99 101 101 102  CO2 12*   < > 19* 23 23 21* 21*  GLUCOSE 105*   < > 108* 138* 108* 106* 105*  BUN 42*   < > 35* 29* 22* 19 17  CREATININE 4.79*   < > 3.67* 3.11* 2.01* 1.74* 1.52*  CALCIUM  9.9   < > 8.9 8.8* 8.0* 7.7* 7.8*  MG 2.3  --   --  1.7  --   --  2.0  PHOS 5.3*  --   --   --  1.9*  --  2.7   < > = values in this interval not displayed.   GFR: Estimated Creatinine Clearance: 54.8 mL/min (A) (by C-G formula based on SCr of 1.52 mg/dL (H)). Recent Labs  Lab 05/06/24 0250 05/06/24 0647 05/06/24 1644 05/06/24 2016 05/06/24 2311 05/07/24 0310 05/08/24 0458  WBC 15.8*  --   --   --   --  13.9* 16.0*  LATICACIDVEN  --    < > 5.4* 4.6* 3.8* 2.8*  --    < > = values in this interval not displayed.    Liver Function Tests: Recent Labs  Lab 05/06/24 0053 05/06/24 0433 05/07/24 0310 05/07/24 1708 05/08/24 0458  AST 1,885*  --  1,448*  --  1,031*  ALT 572*  --  665*  --  630*  ALKPHOS 136*  --  157*  --  177*  BILITOT 6.0*  --  9.0*  --  11.4*  PROT 5.1*  --  5.5*  --  5.7*  ALBUMIN 2.6* 3.1* 2.9* 3.0* 3.0*   No results for input(s): LIPASE, AMYLASE in the last 168 hours. Recent Labs  Lab 05/06/24 0053  AMMONIA 39*     ABG    Component Value Date/Time   PHART 7.371 05/06/2024 1110   PCO2ART  27.1 (L) 05/06/2024 1110   PO2ART 85 05/06/2024 1110   HCO3 15.8 (L) 05/06/2024 1110   TCO2 17 (L) 05/06/2024 1110   ACIDBASEDEF 9.0 (H) 05/06/2024 1110   O2SAT 70.5 05/07/2024 1312     Coagulation Profile: Recent Labs  Lab 05/06/24 0053 05/07/24 0310 05/08/24 0458  INR 5.9* 3.2* 2.2*    Cardiac Enzymes: No results for input(s): CKTOTAL, CKMB, CKMBINDEX, TROPONINI in the last 168 hours.  HbA1C: Hgb A1c MFr Bld  Date/Time Value Ref Range Status  05/06/2024 02:50 AM 5.6 4.8 - 5.6 % Final    Comment:    Repeated to verify  (NOTE) Diagnosis of Diabetes The following HbA1c ranges recommended by the American Diabetes Association (ADA) may be used as an aid in the diagnosis of diabetes mellitus.  Hemoglobin             Suggested A1C NGSP%              Diagnosis  <5.7                   Non Diabetic  5.7-6.4                Pre-Diabetic  >6.4                   Diabetic  <7.0                   Glycemic control for                       adults with diabetes.    08/27/2020 11:50 AM 5.9 (H) 4.8 - 5.6 % Final    Comment:    (NOTE) Pre diabetes:          5.7%-6.4%  Diabetes:              >6.4%  Glycemic control for   <7.0% adults with diabetes     CBG: Recent Labs  Lab 05/07/24 1943 05/07/24 2306 05/07/24 2326 05/08/24 0329 05/08/24 0753  GLUCAP 103* 102* 105* 103* 99   The patient is critically ill due to Shock requiring multiple pressors .  Critical care was necessary to treat or prevent imminent or life-threatening deterioration. Critical care time was spent by me on the following activities: development of a treatment plan with the patient and/or surrogate as well as nursing, discussions with consultants, evaluation of the patient's response to treatment, examination of the patient, obtaining a history from the patient or surrogate, ordering and performing treatments and  interventions, ordering and review of laboratory studies, ordering and review of radiographic studies, review of telemetry data including pulse oximetry, re-evaluation of patient's condition and participation in multidisciplinary rounds.   I personally spent 51 minutes providing critical care not including any separately billable procedures.   Zola LOISE Herter, MD [facility] 05/08/2024 9:42 AM           "

## 2024-05-08 NOTE — Progress Notes (Signed)
 Assessed L arm. Found arm swollen from fingers to upper arm.  Two I.V. RN's Assessed R arm for new I.V. site, no veins visible with or without ultrasound. Discussed assessment with bedside RN.

## 2024-05-08 NOTE — Progress Notes (Signed)
 Placed a IV team consult to come and check patients LUA midline. IV fllushes good with no blood drawback. Left arm is swollen more than the right. Cool to touch. Acetylcysteine  is infusing through this IV. Continuing to monitor patient.

## 2024-05-08 NOTE — Progress Notes (Signed)
 " [facility] Progress Note   Subjective:    Remains on vasopressor support.  UOP yesterday, CRRT UF 4.8L, net -2.5L yesterday.  Cardiology consulted - co ox checked 70%, thinking septic shock  Objective Vitals:   05/08/24 0755 05/08/24 0800 05/08/24 0844 05/08/24 0900  BP:      Pulse:  93  77  Resp:  20 (!) 25 (!) 24  Temp: 98.1 F (36.7 C)     TempSrc: Axillary     SpO2:  99%  100%  Weight:       Physical Exam General: sleepy calm on precedex  Heart:RRR Lungs: coarse Abdomen: soft, mildly distended Extremities: 1-2+ diffuse 4 extremity edema GU: foley draining amber urine Dialysis Access:  temp femoral HD catheter c/d/i  Additional Objective Labs: Basic Metabolic Panel: Recent Labs  Lab 05/06/24 0433 05/06/24 1101 05/07/24 1708 05/07/24 2311 05/08/24 0458  NA 128*   < > 134* 133* 135  K 6.3*   < > 4.0 4.0 4.1  CL 89*   < > 101 101 102  CO2 12*   < > 23 21* 21*  GLUCOSE 105*   < > 108* 106* 105*  BUN 42*   < > 22* 19 17  CREATININE 4.79*   < > 2.01* 1.74* 1.52*  CALCIUM  9.9   < > 8.0* 7.7* 7.8*  PHOS 5.3*  --  1.9*  --  2.7   < > = values in this interval not displayed.   Liver Function Tests: Recent Labs  Lab 05/06/24 0053 05/06/24 0433 05/07/24 0310 05/07/24 1708 05/08/24 0458  AST 1,885*  --  1,448*  --  1,031*  ALT 572*  --  665*  --  630*  ALKPHOS 136*  --  157*  --  177*  BILITOT 6.0*  --  9.0*  --  11.4*  PROT 5.1*  --  5.5*  --  5.7*  ALBUMIN 2.6*   < > 2.9* 3.0* 3.0*   < > = values in this interval not displayed.   No results for input(s): LIPASE, AMYLASE in the last 168 hours. CBC: Recent Labs  Lab 05/06/24 0250 05/06/24 1101 05/07/24 0310 05/07/24 1100 05/07/24 1615 05/07/24 2311 05/08/24 0458  WBC 15.8*  --  13.9*  --   --   --  16.0*  NEUTROABS 13.7*  --   --   --   --   --   --   HGB 7.2*   < > 7.7*   < > 7.9* 8.0* 8.4*  HCT 23.6*   < > 23.1*   < > 24.0* 24.1* 25.4*  MCV 79.2*  --  75.0*  --   --   --   74.3*  PLT 217  --  151  --   --   --  121*   < > = values in this interval not displayed.   Blood Culture    Component Value Date/Time   SDES URINE, RANDOM 05/06/2024 1644   SPECREQUEST NONE Reflexed from [id number] 05/06/2024 1644   CULT  05/06/2024 1644    NO GROWTH Performed at [facility], [address] [place], [place] [address]    REPTSTATUS 05/07/2024 FINAL 05/06/2024 1644    Cardiac Enzymes: No results for input(s): CKTOTAL, CKMB, CKMBINDEX, TROPONINI in the last 168 hours. CBG: Recent Labs  Lab 05/07/24 1943 05/07/24 2306 05/07/24 2326 05/08/24 0329 05/08/24 0753  GLUCAP 103* 102* 105* 103* 99   Iron  Studies: No results  for input(s): IRON , TIBC, TRANSFERRIN, FERRITIN in the last 72 hours. @lablastinr3 @ Studies/Results: US  ABDOMEN LIMITED WITH LIVER DOPPLER Result Date: 05/06/2024 CLINICAL DATA:  Transaminitis.  History of chronic heart failure. EXAM: DUPLEX ULTRASOUND OF LIVER TECHNIQUE: Color and duplex Doppler ultrasound was performed to evaluate the hepatic in-flow and out-flow vessels. COMPARISON:  Abdomen pelvis 01/12/2024 FINDINGS: Liver: Gallbladder is decompressed with mild wall thickening measuring up to 0.6 cm. These findings are nonspecific. Common bile duct measures 0.3 cm. Liver contour is slightly nodular. No discrete liver lesion. Main Portal Vein size: 0.8 cm Portal Vein Velocities Main Prox:  44 cm/sec Main Mid: 47 cm/sec Main Dist:  55 cm/sec Right: 79 cm/sec Left: 46 cm/sec Hepatic Vein Velocities Right:  87 cm/sec Middle:  36 cm/sec Left:  33 cm/sec IVC: Present and patent with normal respiratory phasicity. Hepatic Artery Velocity:  146 cm/sec Splenic Vein Velocity:  41 cm/sec Spleen was not visualized. Portal Vein Occlusion/Thrombus: No Splenic Vein Occlusion/Thrombus: No Ascites: None Varices: None Bidirectional flow in the portal veins with hepatopetal and hepatofugal flow. Primarily hepatofugal flow in the hepatic veins.  IMPRESSION: 1. Portal venous system is patent with bidirectional flow. This finding is likely associated with the patient's chronic heart failure. 2. Hepatic veins are patent. 3. Gallbladder is decompressed with mild wall thickening. These findings are nonspecific. 4. Liver has a slightly nodular contour. Mild cirrhotic changes cannot be excluded. Electronically Signed   By: Juliene Balder M.D.   On: 05/06/2024 18:26   ECHOCARDIOGRAM COMPLETE Result Date: 05/06/2024    ECHOCARDIOGRAM REPORT   Patient Name:   ARLAN BIRKS Date of Exam: 05/06/2024 Medical Rec #:  [id number]           Height:       68.0 in Accession #:    [id number]          Weight:       194.9 lb Date of Birth:  March 13, 1966            BSA:          2.021 m Patient Age:    59 years            BP:           112/95 mmHg Patient Gender: M                   HR:           116 bpm. Exam Location:  Inpatient Procedure: 2D Echo, Cardiac Doppler, Color Doppler and Intracardiac            Opacification Agent (Both Spectral and Color Flow Doppler were            utilized during procedure). Indications:    Shock R57.9  History:        Patient has prior history of Echocardiogram examinations, most                 recent 01/13/2024. CHF.  Sonographer:    Nathanel Devonshire Referring Phys: [id number] TARA N WILSON IMPRESSIONS  1. LV thrombus present in the apex. Left ventricular ejection fraction, by estimation, is <20%. The left ventricle has severely decreased function. The left ventricle demonstrates global hypokinesis. The left ventricular internal cavity size was mildly to moderately dilated. Left ventricular diastolic function could not be evaluated. There is the interventricular septum is flattened in systole, consistent with right ventricular pressure overload.  2. RVSP underestimated due to early equilization of pressures, severe TR. Right ventricular  systolic function is severely reduced. The right ventricular size is severely enlarged.  3. Left atrial size was  moderately dilated.  4. Right atrial size was severely dilated.  5. The mitral valve is normal in structure. Mild mitral valve regurgitation. No evidence of mitral stenosis.  6. Severe lead related TR. The tricuspid valve is degenerative. Tricuspid valve regurgitation is severe.  7. The aortic valve is tricuspid. Aortic valve regurgitation is not visualized. Aortic valve sclerosis is present, with no evidence of aortic valve stenosis.  8. The inferior vena cava is dilated in size with <50% respiratory variability, suggesting right atrial pressure of 15 mmHg. FINDINGS  Left Ventricle: LV thrombus present in the apex. Left ventricular ejection fraction, by estimation, is <20%. The left ventricle has severely decreased function. The left ventricle demonstrates global hypokinesis. Definity  contrast agent was given IV to delineate the left ventricular endocardial borders. The left ventricular internal cavity size was mildly to moderately dilated. There is no left ventricular hypertrophy. The interventricular septum is flattened in systole, consistent with right ventricular pressure overload. Left ventricular diastolic function could not be evaluated due to paced rhythm. Left ventricular diastolic function could not be evaluated. Right Ventricle: RVSP underestimated due to early equilization of pressures, severe TR. The right ventricular size is severely enlarged. No increase in right ventricular wall thickness. Right ventricular systolic function is severely reduced. The tricuspid regurgitant velocity is 1.77 m/s, and with an assumed right atrial pressure of 15 mmHg, the estimated right ventricular systolic pressure is 27.5 mmHg. Left Atrium: Left atrial size was moderately dilated. Right Atrium: Right atrial size was severely dilated. Pericardium: There is no evidence of pericardial effusion. Mitral Valve: The mitral valve is normal in structure. Mild mitral valve regurgitation. No evidence of mitral valve stenosis.  Tricuspid Valve: Severe lead related TR. The tricuspid valve is degenerative in appearance. Tricuspid valve regurgitation is severe. No evidence of tricuspid stenosis. Aortic Valve: The aortic valve is tricuspid. Aortic valve regurgitation is not visualized. Aortic valve sclerosis is present, with no evidence of aortic valve stenosis. Aortic valve mean gradient measures 4.0 mmHg. Aortic valve peak gradient measures 6.9  mmHg. Aortic valve area, by VTI measures 1.60 cm. Pulmonic Valve: The pulmonic valve was normal in structure. Pulmonic valve regurgitation is not visualized. No evidence of pulmonic stenosis. Aorta: The aortic root is normal in size and structure. Venous: The inferior vena cava is dilated in size with less than 50% respiratory variability, suggesting right atrial pressure of 15 mmHg. IAS/Shunts: No atrial level shunt detected by color flow Doppler. Additional Comments: A device lead is visualized.  LEFT VENTRICLE PLAX 2D LVIDd:         6.20 cm LVIDs:         5.70 cm LV PW:         0.90 cm LV IVS:        1.00 cm LVOT diam:     2.00 cm LV SV:         29 LV SV Index:   14 LVOT Area:     3.14 cm  LV Volumes (MOD) LV vol d, MOD A2C: 134.0 ml LV vol d, MOD A4C: 122.0 ml LV vol s, MOD A2C: 109.0 ml LV vol s, MOD A4C: 98.7 ml LV SV MOD A2C:     25.0 ml LV SV MOD A4C:     122.0 ml LV SV MOD BP:      22.6 ml RIGHT VENTRICLE  IVC RV Basal diam:  5.00 cm    IVC diam: 4.10 cm RV S prime:     9.03 cm/s TAPSE (M-mode): 1.3 cm LEFT ATRIUM             Index        RIGHT ATRIUM           Index LA diam:        4.90 cm 2.42 cm/m   RA Area:     31.90 cm LA Vol (A2C):   78.9 ml 39.03 ml/m  RA Volume:   124.00 ml 61.34 ml/m LA Vol (A4C):   80.7 ml 39.92 ml/m LA Biplane Vol: 82.2 ml 40.67 ml/m  AORTIC VALVE                     PULMONIC VALVE AV Area (Vmax):    1.47 cm      PV Vmax:       0.73 m/s AV Area (Vmean):   1.15 cm      PV Peak grad:  2.1 mmHg AV Area (VTI):     1.60 cm AV Vmax:           131.00  cm/s AV Vmean:          101.000 cm/s AV VTI:            0.181 m AV Peak Grad:      6.9 mmHg AV Mean Grad:      4.0 mmHg LVOT Vmax:         61.10 cm/s LVOT Vmean:        36.900 cm/s LVOT VTI:          0.092 m LVOT/AV VTI ratio: 0.51  AORTA Ao Root diam: 3.40 cm Ao Asc diam:  3.60 cm TRICUSPID VALVE TR Peak grad:   12.5 mmHg TR Vmax:        177.00 cm/s  SHUNTS Systemic VTI:  0.09 m Systemic Diam: 2.00 cm Morene Brownie Electronically signed by Morene Brownie Signature Date/Time: 05/06/2024/4:43:03 PM    Final    CT HEAD WO CONTRAST ( ) Result Date: 05/06/2024 EXAM: CT HEAD WITHOUT CONTRAST 05/06/2024 09:36:20 AM TECHNIQUE: CT of the head was performed without the administration of intravenous contrast. Automated exposure control, iterative reconstruction, and/or weight based adjustment of the mA/kV was utilized to reduce the radiation dose to as low as reasonably achievable. COMPARISON: MRI brain 08/27/2020. CLINICAL HISTORY: Altered mental status, nontraumatic (Ped 0-17y). FINDINGS: BRAIN AND VENTRICLES: No acute hemorrhage. No evidence of acute infarct. Focal encephalomalacia in the anterior inferior right frontal lobe was similar to prior and may reflect remote infarct versus sequelae of prior trauma. Small remote bilateral cerebellar infarcts. Calcific atherosclerosis. No hydrocephalus. No extra-axial collection. No mass effect or midline shift. ORBITS: No acute abnormality. SINUSES: No acute abnormality. SOFT TISSUES AND SKULL: No acute soft tissue abnormality. No skull fracture. IMPRESSION: 1. No acute intracranial abnormality. 2. Focal encephalomalacia in the anterior inferior right frontal lobe, similar to prior MRI, which may reflect remote infarct versus sequelae of prior trauma. 3. Small remote bilateral cerebellar infarcts. Electronically signed by: Donnice Mania MD 05/06/2024 09:59 AM EST RP Workstation: [id number]   Medications:  acetylcysteine  6.25 mg/kg/hr (05/08/24 0900)   ceFEPime  (MAXIPIME )  IV Stopped (05/07/24 2344)   dexmedetomidine  (PRECEDEX ) IV infusion 0.4 mcg/kg/hr (05/08/24 0925)   heparin  10,000 units/ 20 mL infusion syringe 500 Units/hr (05/08/24 0454)   metronidazole  Stopped (05/07/24 2146)   norepinephrine  (LEVOPHED ) Adult infusion  21 mcg/min (05/08/24 0900)   octreotide  (SANDOSTATIN ) 500 mcg in sodium chloride  0.9 % 250 mL (2 mcg/mL) infusion 50 mcg/hr (05/08/24 0900)   prismasol  BGK 4/2.5 400 mL/hr at 05/07/24 2147   prismasol  BGK 4/2.5 1,500 mL/hr at 05/08/24 0910   prismasol  BGK 4/2.5 400 mL/hr at 05/07/24 2147   vancomycin      vasopressin  0.04 Units/min (05/08/24 0900)    Chlorhexidine  Gluconate Cloth  6 each Topical Daily   insulin  aspart  0-6 Units Subcutaneous Q4H   pantoprazole  (PROTONIX ) IV  40 mg Intravenous Q12H   topiramate   25 mg Oral Daily    Assessment 21M severe AKI with hyperkalemia and EKG changes; undifferentiated shock (cardiogenic, hemorrhagic, septic?),  HFrEF with history of ICD, history of esophageal varices presenting with GI bleed, hypothermia, persistent hypoglycemia, LFT elevation   AKI on CKD3 secondary to #3 Severe hyperkalemia with EKG changes resolved with CRRT Multifactorial shock on norepinephrine  - TTE with EF < 20% bit Co ox 70% so thought mostly septic in etiology and on broad spectrum abx with no clear source of infection ABLA/GI bleed, history of esophageal varices tx w octreotide  and norepinephrine , required transfusions but no e/o ongoing active bleeding, GI following Severe metabolic acidosis with elevated lactate improving ALF - improving, query shock liver, on NAC and supportive care; Coagulopathy with elevated INR s/p vit K   Plan Continue CRRT, cont 4K, continue BID labs UF with CRRT for goal net negative today based on TTE findings suggesting vol OL + peripheral edema - 50 to 168ml/hr net neg for now, would not increase pressors to facilitate UF at this time Cont holding SGLT2, MRB, ARB If ongoing need for RRT  this week would favor change fem HD cath to IJ  Manuelita Barters MD 05/08/2024, 9:29 AM  [facility] Pager: [phone]   "

## 2024-05-09 ENCOUNTER — Inpatient Hospital Stay (HOSPITAL_COMMUNITY)

## 2024-05-09 ENCOUNTER — Encounter (HOSPITAL_COMMUNITY): Admission: EM | Disposition: E | Payer: Self-pay | Source: Home / Self Care

## 2024-05-09 DIAGNOSIS — Z66 Do not resuscitate: Secondary | ICD-10-CM | POA: Diagnosis not present

## 2024-05-09 DIAGNOSIS — Z7189 Other specified counseling: Secondary | ICD-10-CM | POA: Diagnosis not present

## 2024-05-09 DIAGNOSIS — N179 Acute kidney failure, unspecified: Secondary | ICD-10-CM | POA: Diagnosis not present

## 2024-05-09 DIAGNOSIS — N189 Chronic kidney disease, unspecified: Secondary | ICD-10-CM | POA: Diagnosis not present

## 2024-05-09 DIAGNOSIS — Z515 Encounter for palliative care: Secondary | ICD-10-CM

## 2024-05-09 DIAGNOSIS — J811 Chronic pulmonary edema: Secondary | ICD-10-CM | POA: Diagnosis not present

## 2024-05-09 DIAGNOSIS — K7460 Unspecified cirrhosis of liver: Secondary | ICD-10-CM | POA: Diagnosis not present

## 2024-05-09 DIAGNOSIS — R609 Edema, unspecified: Secondary | ICD-10-CM

## 2024-05-09 DIAGNOSIS — R579 Shock, unspecified: Secondary | ICD-10-CM | POA: Diagnosis not present

## 2024-05-09 DIAGNOSIS — Z711 Person with feared health complaint in whom no diagnosis is made: Secondary | ICD-10-CM | POA: Diagnosis not present

## 2024-05-09 DIAGNOSIS — E872 Acidosis, unspecified: Secondary | ICD-10-CM | POA: Diagnosis not present

## 2024-05-09 DIAGNOSIS — J9601 Acute respiratory failure with hypoxia: Secondary | ICD-10-CM | POA: Diagnosis not present

## 2024-05-09 DIAGNOSIS — G928 Other toxic encephalopathy: Secondary | ICD-10-CM | POA: Diagnosis not present

## 2024-05-09 DIAGNOSIS — I5023 Acute on chronic systolic (congestive) heart failure: Secondary | ICD-10-CM | POA: Diagnosis not present

## 2024-05-09 HISTORY — PX: RIGHT HEART CATH: CATH118263

## 2024-05-09 LAB — POCT I-STAT EG7
Acid-base deficit: 2 mmol/L (ref 0.0–2.0)
Acid-base deficit: 3 mmol/L — ABNORMAL HIGH (ref 0.0–2.0)
Bicarbonate: 23.1 mmol/L (ref 20.0–28.0)
Bicarbonate: 23.5 mmol/L (ref 20.0–28.0)
Calcium, Ion: 1.05 mmol/L — ABNORMAL LOW (ref 1.15–1.40)
Calcium, Ion: 1.07 mmol/L — ABNORMAL LOW (ref 1.15–1.40)
HCT: 30 % — ABNORMAL LOW (ref 39.0–52.0)
HCT: 31 % — ABNORMAL LOW (ref 39.0–52.0)
Hemoglobin: 10.2 g/dL — ABNORMAL LOW (ref 13.0–17.0)
Hemoglobin: 10.5 g/dL — ABNORMAL LOW (ref 13.0–17.0)
O2 Saturation: 52 %
O2 Saturation: 56 %
Potassium: 4.2 mmol/L (ref 3.5–5.1)
Potassium: 4.2 mmol/L (ref 3.5–5.1)
Sodium: 139 mmol/L (ref 135–145)
Sodium: 139 mmol/L (ref 135–145)
TCO2: 24 mmol/L (ref 22–32)
TCO2: 25 mmol/L (ref 22–32)
pCO2, Ven: 43.7 mmHg — ABNORMAL LOW (ref 44–60)
pCO2, Ven: 44.4 mmHg (ref 44–60)
pH, Ven: 7.324 (ref 7.25–7.43)
pH, Ven: 7.338 (ref 7.25–7.43)
pO2, Ven: 30 mmHg — CL (ref 32–45)
pO2, Ven: 31 mmHg — CL (ref 32–45)

## 2024-05-09 LAB — GLUCOSE, CAPILLARY
Glucose-Capillary: 117 mg/dL — ABNORMAL HIGH (ref 70–99)
Glucose-Capillary: 95 mg/dL (ref 70–99)
Glucose-Capillary: 121 mg/dL — ABNORMAL HIGH (ref 70–99)
Glucose-Capillary: 95 mg/dL (ref 70–99)
Glucose-Capillary: 127 mg/dL — ABNORMAL HIGH (ref 70–99)
Glucose-Capillary: 134 mg/dL — ABNORMAL HIGH (ref 70–99)

## 2024-05-09 LAB — PROTIME-INR
INR: 1.9 — ABNORMAL HIGH (ref 0.8–1.2)
Prothrombin Time: 22.4 s — ABNORMAL HIGH (ref 11.4–15.2)

## 2024-05-09 LAB — CBC
HCT: 26 % — ABNORMAL LOW (ref 39.0–52.0)
HCT: 25 % — ABNORMAL LOW (ref 39.0–52.0)
Hemoglobin: 8.5 g/dL — ABNORMAL LOW (ref 13.0–17.0)
Hemoglobin: 8.1 g/dL — ABNORMAL LOW (ref 13.0–17.0)
MCH: 24.4 pg — ABNORMAL LOW (ref 26.0–34.0)
MCH: 24.5 pg — ABNORMAL LOW (ref 26.0–34.0)
MCHC: 32.7 g/dL (ref 30.0–36.0)
MCHC: 32.4 g/dL (ref 30.0–36.0)
MCV: 74.7 fL — ABNORMAL LOW (ref 80.0–100.0)
MCV: 75.8 fL — ABNORMAL LOW (ref 80.0–100.0)
Platelets: 118 K/uL — ABNORMAL LOW (ref 150–400)
Platelets: 103 K/uL — ABNORMAL LOW (ref 150–400)
RBC: 3.48 MIL/uL — ABNORMAL LOW (ref 4.22–5.81)
RBC: 3.3 MIL/uL — ABNORMAL LOW (ref 4.22–5.81)
RDW: 24.5 % — ABNORMAL HIGH (ref 11.5–15.5)
RDW: 24.8 % — ABNORMAL HIGH (ref 11.5–15.5)
WBC: 18.4 K/uL — ABNORMAL HIGH (ref 4.0–10.5)
WBC: 18.9 K/uL — ABNORMAL HIGH (ref 4.0–10.5)
nRBC: 3.1 % — ABNORMAL HIGH (ref 0.0–0.2)
nRBC: 4.3 % — ABNORMAL HIGH (ref 0.0–0.2)

## 2024-05-09 LAB — COMPREHENSIVE METABOLIC PANEL WITH GFR
ALT: 552 U/L — ABNORMAL HIGH (ref 0–44)
AST: 647 U/L — ABNORMAL HIGH (ref 15–41)
Albumin: 3 g/dL — ABNORMAL LOW (ref 3.5–5.0)
Alkaline Phosphatase: 185 U/L — ABNORMAL HIGH (ref 38–126)
Anion gap: 11 (ref 5–15)
BUN: 13 mg/dL (ref 6–20)
CO2: 22 mmol/L (ref 22–32)
Calcium: 7.9 mg/dL — ABNORMAL LOW (ref 8.9–10.3)
Chloride: 102 mmol/L (ref 98–111)
Creatinine, Ser: 1.47 mg/dL — ABNORMAL HIGH (ref 0.61–1.24)
GFR, Estimated: 54 mL/min — ABNORMAL LOW
Glucose, Bld: 120 mg/dL — ABNORMAL HIGH (ref 70–99)
Potassium: 4.2 mmol/L (ref 3.5–5.1)
Sodium: 135 mmol/L (ref 135–145)
Total Bilirubin: 12.6 mg/dL — ABNORMAL HIGH (ref 0.0–1.2)
Total Protein: 5.9 g/dL — ABNORMAL LOW (ref 6.5–8.1)

## 2024-05-09 LAB — BASIC METABOLIC PANEL WITH GFR
Anion gap: 12 (ref 5–15)
Anion gap: 13 (ref 5–15)
BUN: 14 mg/dL (ref 6–20)
BUN: 17 mg/dL (ref 6–20)
CO2: 22 mmol/L (ref 22–32)
CO2: 22 mmol/L (ref 22–32)
Calcium: 8 mg/dL — ABNORMAL LOW (ref 8.9–10.3)
Calcium: 8.2 mg/dL — ABNORMAL LOW (ref 8.9–10.3)
Chloride: 102 mmol/L (ref 98–111)
Chloride: 101 mmol/L (ref 98–111)
Creatinine, Ser: 1.73 mg/dL — ABNORMAL HIGH (ref 0.61–1.24)
Creatinine, Ser: 2.09 mg/dL — ABNORMAL HIGH (ref 0.61–1.24)
GFR, Estimated: 45 mL/min — ABNORMAL LOW
GFR, Estimated: 36 mL/min — ABNORMAL LOW
Glucose, Bld: 119 mg/dL — ABNORMAL HIGH (ref 70–99)
Glucose, Bld: 127 mg/dL — ABNORMAL HIGH (ref 70–99)
Potassium: 4.4 mmol/L (ref 3.5–5.1)
Potassium: 4.2 mmol/L (ref 3.5–5.1)
Sodium: 135 mmol/L (ref 135–145)
Sodium: 135 mmol/L (ref 135–145)

## 2024-05-09 LAB — COOXEMETRY PANEL
Carboxyhemoglobin: 1.4 % (ref 0.5–1.5)
Methemoglobin: <0.7 % (ref 0.0–1.5)
O2 Saturation: 69.7 %
Total hemoglobin: 8.4 g/dL — ABNORMAL LOW (ref 12.0–16.0)

## 2024-05-09 LAB — PHOSPHORUS: Phosphorus: 2.8 mg/dL (ref 2.5–4.6)

## 2024-05-09 LAB — APTT: aPTT: 51 s — ABNORMAL HIGH (ref 24–36)

## 2024-05-09 LAB — HEMOGLOBIN AND HEMATOCRIT, BLOOD
HCT: 25.9 % — ABNORMAL LOW (ref 39.0–52.0)
HCT: 23.4 % — ABNORMAL LOW (ref 39.0–52.0)
Hemoglobin: 8.5 g/dL — ABNORMAL LOW (ref 13.0–17.0)
Hemoglobin: 7.6 g/dL — ABNORMAL LOW (ref 13.0–17.0)

## 2024-05-09 LAB — LACTIC ACID, PLASMA
Lactic Acid, Venous: 2.2 mmol/L (ref 0.5–1.9)
Lactic Acid, Venous: 2.4 mmol/L (ref 0.5–1.9)

## 2024-05-09 LAB — MAGNESIUM: Magnesium: 2.2 mg/dL (ref 1.7–2.4)

## 2024-05-09 MED ORDER — LIDOCAINE HCL (PF) 1 % IJ SOLN
INTRAMUSCULAR | Status: AC
  Filled 2024-05-09: qty 30

## 2024-05-09 MED ORDER — MIDAZOLAM HCL (PF) 2 MG/2ML IJ SOLN
INTRAMUSCULAR | Status: DC | PRN
  Administered 2024-05-09: 1 mg via INTRAVENOUS

## 2024-05-09 MED ORDER — FENTANYL CITRATE (PF) 100 MCG/2ML IJ SOLN
INTRAMUSCULAR | Status: DC | PRN
  Administered 2024-05-09 (×2): 25 ug via INTRAVENOUS

## 2024-05-09 MED ORDER — PANTOPRAZOLE SODIUM 40 MG IV SOLR
40.0000 mg | Freq: Every day | INTRAVENOUS | Status: DC
  Administered 2024-05-09 – 2024-05-10 (×2): 40 mg via INTRAVENOUS
  Filled 2024-05-09 (×2): qty 10

## 2024-05-09 MED ORDER — FENTANYL CITRATE (PF) 100 MCG/2ML IJ SOLN
INTRAMUSCULAR | Status: AC
  Filled 2024-05-09: qty 2

## 2024-05-09 MED ORDER — ORAL CARE MOUTH RINSE: 15.0000 mL | OROMUCOSAL | Status: DC | PRN

## 2024-05-09 MED ORDER — MIDAZOLAM HCL 2 MG/2ML IJ SOLN
INTRAMUSCULAR | Status: AC
  Filled 2024-05-09: qty 2

## 2024-05-09 MED ORDER — DOBUTAMINE-DEXTROSE 4-5 MG/ML-% IV SOLN
2.5000 ug/kg/min | INTRAVENOUS | Status: DC
  Administered 2024-05-09: 5 ug/kg/min via INTRAVENOUS
  Filled 2024-05-09 (×2): qty 250

## 2024-05-09 MED ORDER — SODIUM CHLORIDE 0.9% FLUSH: 3.0000 mL | INTRAVENOUS | Status: DC | PRN

## 2024-05-09 MED ORDER — HEPARIN (PORCINE) IN NACL 1000-0.9 UT/500ML-% IV SOLN
INTRAVENOUS | Status: DC | PRN
  Administered 2024-05-09: 500 mL

## 2024-05-09 MED ORDER — EPINEPHRINE HCL 5 MG/250ML IV SOLN IN NS
0.5000 ug/min | INTRAVENOUS | Status: DC
  Administered 2024-05-09: 15.5 ug/min via INTRAVENOUS
  Administered 2024-05-09: 0.5 ug/min via INTRAVENOUS
  Administered 2024-05-10 (×4): 16 ug/min via INTRAVENOUS
  Administered 2024-05-11: 17 ug/min via INTRAVENOUS
  Administered 2024-05-11: 16 ug/min via INTRAVENOUS
  Administered 2024-05-11: 17 ug/min via INTRAVENOUS
  Filled 2024-05-09 (×9): qty 250

## 2024-05-09 MED ORDER — SODIUM CHLORIDE 0.9 % IV SOLN: 250.0000 mL | INTRAVENOUS | Status: DC | PRN

## 2024-05-09 MED ORDER — LIDOCAINE HCL (PF) 1 % IJ SOLN
INTRAMUSCULAR | Status: DC | PRN
  Administered 2024-05-09: 2 mL

## 2024-05-09 MED ORDER — SODIUM CHLORIDE 0.9% FLUSH: 3.0000 mL | Freq: Two times a day (BID) | INTRAVENOUS | Status: DC

## 2024-05-09 MED ORDER — HEPARIN (PORCINE) 25000 UT/250ML-% IV SOLN
1400.0000 [IU]/h | INTRAVENOUS | Status: DC
  Administered 2024-05-09: 1050 [IU]/h via INTRAVENOUS
  Administered 2024-05-10: 1350 [IU]/h via INTRAVENOUS
  Administered 2024-05-11: 1400 [IU]/h via INTRAVENOUS
  Filled 2024-05-09 (×3): qty 250

## 2024-05-09 MED ORDER — SODIUM PHOSPHATES 45 MMOLE/15ML IV SOLN
20.0000 mmol | Freq: Once | INTRAVENOUS | Status: AC
  Administered 2024-05-09: 20 mmol via INTRAVENOUS
  Filled 2024-05-09: qty 6.67

## 2024-05-09 MED ORDER — ORAL CARE MOUTH RINSE
15.0000 mL | OROMUCOSAL | Status: DC
  Administered 2024-05-09 (×2): 15 mL via OROMUCOSAL

## 2024-05-09 NOTE — Consult Note (Signed)
 " Palliative Medicine Inpatient Consult Note  Consulting Provider:  Zaida Zola SAILOR, MD   Reason for consult:   Palliative Care Consult Services Palliative Medicine Consult  Reason for Consult? severe heart failure, renal failure requiring dialysis, reported history in the past of poor compliance   05/09/2024  HPI:  Per intake H&P -->  59 year old male history of chronic HFrEF s/p ICD, HTN, BPAD, history of GI bleed, history of esophageal varices, and CKD stage III who presented to the ED with progressive altered mental status and 3 days of blood in stool.  Has had a complicated hospitalization in the setting of cardiogenic shock resulting in multisystem organ failure. Palliative care has been asked to support additional goals of care conversations in the setting of significant disease burden.  Had been seen by the PMT 9/12-9/15 2025. At that time. Patient vocalized the desire for all life prolonging measures.   Clinical Assessment/Goals of Care:  *Please note that this is a verbal dictation therefore any spelling or grammatical errors are due to the Dragon Medical One system interpretation.  I have reviewed medical records including EPIC notes, labs and imaging, received report from bedside RN, assessed the patient who is lying in bed on sedation and pressor support.   I called and spoke to patient's niece, Cody Guzman to further discuss diagnosis prognosis, GOC, EOL wishes, disposition and options.   I introduced Palliative Medicine as specialized medical care for people living with serious illness. It focuses on providing relief from the symptoms and stress of a serious illness. The goal is to improve quality of life for both the patient and the family.  Medical History Review and Understanding:  A review of Cody Guzman's past medical history significant for chronic kidney disease, neuropathy, atrial fibrillation, congestive heart failure, coronary artery disease, and anemia was  completed.  Social History:  Cody Guzman is originally from [place] .  He has lived all throughout the [place]  and has children in [place]  [place], [place], [place], and [place] .  His brother, Cody Guzman lives in [place] .  He is not married though has a long time partner, Cody Guzman.  He has multiple children though his niece is unable to quantify how many (my prior colleague had written seven).  He is not actively working though does take courses at [employer] for enrichment studying criminal justice and patient's niece shares he lives a very fulfilling life with multiple animals and lizards in his home.  He does have a personal faith in God.  Functional and Nutritional State:  Preceding hospitalization, Cody Guzman was living with his nephew, Cody Guzman and able to perform bADLS and iADLs on his own. He did utilize a scooter for mobility and had the desire to purchase a care but was unable to accomplish this.   Advance Directives:  A detailed discussion was had today regarding advanced directives.  Patient had never created these though it was strongly encouraged in September when he was seen by our team.    Code Status:  Concepts specific to code status, artifical feeding and hydration, continued IV antibiotics and rehospitalization was had.  The difference between a aggressive medical intervention path  and a palliative comfort care path for this patient at this time was had.   Encouraged patient/family to consider DNR/DNI status understanding evidenced based poor outcomes in similar hospitalized patient, as the cause of arrest is likely associated with advanced chronic/terminal illness rather than an easily reversible acute cardio-pulmonary event. I explained that DNR/DNI does not change the  medical plan and it only comes into effect after a person has arrested (died).  It is a protective measure to keep us  from harming the patient in their last moments of life.   We discussed the importance of identifying family to  assert a surrogate decision maker.   Discussion:  I spoke to [facility] about the concerns associated with Cody Guzman's complicated hospitalization secondary to cardiogenic shock.  We reviewed the multi whole organ systems which now appear to be failing Cody Guzman and the very poor outcomes associated with multisystem organ failure.  We discussed that at this point in time the medical team is doing everything they can to improve patient's current situation though his prognosis is very guarded.  We reviewed best case and worst-case scenario his best case being improvement with current modalities of care titration off of supportive measures such as pressor support, CRRT, & medication to support heart rate intravenously.  We reviewed him being able to graduate from those though this still the unknown of what would be needed moving forward from the perspective of care.  We discussed the idea of potential need for intermittent hemodialysis and the burdens that this would cause the patient.  We discussed whether or not patient's organ failure is going to be something which can be improved upon.  Alternatively we discussed worst-case scenario which should be Cody Guzman continuing to decline and eventually more difficult decisions such as comfort care needing to be made by patient's family.  Right now, Cody shares that she has gained access to patient's phone and is going to call everyone she can to identify if there are any children he can speak on his behalf.  She understands the importance for meeting with the medical team to determine the path moving forward should the worst occur.  Discussed the importance of continued conversation with family and their  medical providers regarding overall plan of care and treatment options, ensuring decisions are within the context of the patients values and GOCs. ____________________________________ Addendum:  I was able to speak to patient's daughter, Cody Guzman who lives in [place] .  She shares  that she is in contact with her father fairly regularly though she had no idea as to how sick he was.  I updated her to the reasons for his acute hospitalization and what is occurring presently.  We reviewed the importance of further conversation in the oncoming days pending patient's improvements and/or declines.  I asked Cody Guzman if she will be able to provide me with any other siblings names or phone numbers.  She shared she has very complicated family dynamics and does not keep in touch with any of her siblings nor would she know where I could find them or how to contact them.  She is willing to speak to the primary medical team as needed for the care of her father, Cody Guzman.  Emotional support provided to her by therapeutic listening given the gravity of the information dispelled.  Decision Maker: Working with the MSW to identify this - patients daughter Cody Guzman has been contacted [phone]  SUMMARY OF RECOMMENDATIONS   Full code/full scope of care  Appreciate the medical social work team helping to identify next of kin  Per palliative care's prior notes there are children and a longtime girlfriend  Patient's niece, Cody is looking through the patient's phone to determine children's names and phone number  Plan to continue current care allowing time for outcomes  It will be important to have a dedicated family meeting in  the oncoming days given patient's fragile health state and high possibility of clinical deterioration  Ongoing palliative care support  Code Status/Advance Care Planning: FULL CODE  Palliative Prophylaxis:  Aspiration, Bowel Regimen, Delirium Protocol, Frequent Pain Assessment, Oral Care, Palliative Wound Care, and Turn Reposition  Additional Recommendations (Limitations, Scope, Preferences): Continue current care  Psycho-social/Spiritual:  Desire for further Chaplaincy support: Yes Additional Recommendations: Education on disease burden severity and  multisystem organ failure   Prognosis: Prognosis is guarded patient has multiple physiological factors which are working against him clinically at this time inclusive of cardiac, renal, liver failure  Discharge Planning: Discharge plan to be determined  Vitals:   05/09/24 0845 05/09/24 0900  BP:    Pulse:    Resp: (!) 27 17  Temp:    SpO2:      Intake/Output Summary (Last 24 hours) at 05/09/2024 9072 Last data filed at 05/09/2024 0900 Gross per 24 hour  Intake 3221.02 ml  Output 4568 ml  Net -1346.98 ml   Last Weight  Most recent update: 05/09/2024  1:58 AM    Weight  83.3 kg (183 lb 10.3 oz)            LABS: CBC:    Component Value Date/Time   WBC 18.4 (H) 05/09/2024 0308   HGB 8.5 (L) 05/09/2024 0308   HGB 15.6 05/29/2022 1335   HCT 26.0 (L) 05/09/2024 0308   HCT 47.2 05/29/2022 1335   PLT 118 (L) 05/09/2024 0308   PLT 256 05/29/2022 1335   MCV 74.7 (L) 05/09/2024 0308   MCV 91 05/29/2022 1335   NEUTROABS 13.7 (H) 05/06/2024 0250   NEUTROABS 2.6 08/20/2021 1423   LYMPHSABS 0.6 (L) 05/06/2024 0250   LYMPHSABS 2.0 08/20/2021 1423   MONOABS 1.4 (H) 05/06/2024 0250   EOSABS 0.0 05/06/2024 0250   EOSABS 0.1 08/20/2021 1423   BASOSABS 0.0 05/06/2024 0250   BASOSABS 0.0 08/20/2021 1423   Comprehensive Metabolic Panel:    Component Value Date/Time   NA 135 05/09/2024 0308   NA 142 02/11/2023 1416   K 4.2 05/09/2024 0308   CL 102 05/09/2024 0308   CO2 22 05/09/2024 0308   BUN 13 05/09/2024 0308   BUN 11 02/11/2023 1416   CREATININE 1.47 (H) 05/09/2024 0308   GLUCOSE 120 (H) 05/09/2024 0308   CALCIUM  7.9 (L) 05/09/2024 0308   AST 647 (H) 05/09/2024 0308   ALT 552 (H) 05/09/2024 0308   ALKPHOS 185 (H) 05/09/2024 0308   BILITOT 12.6 (H) 05/09/2024 0308   BILITOT 0.7 02/11/2023 1416   PROT 5.9 (L) 05/09/2024 0308   PROT 7.0 02/11/2023 1416   ALBUMIN 3.0 (L) 05/09/2024 0308   ALBUMIN 4.3 02/11/2023 1416   Gen: Older African-American male critically ill in  appearance HEENT: Dry mucous membranes CV: Irregular rate and rhythm  PULM: On 2 L nasal cannula breathing is even nonlabored ABD: soft/nontender  EXT: Generalized edema  Neuro: Disoriented  PPS: 10%   This conversation/these recommendations were discussed with patient primary care team, Dr. Zaida and Tinnie Furth  ______________________________________________________ Rosaline Becton [place] Palliative Medicine Team Team Cell Phone: [phone] Please utilize secure chat with additional questions, if there is no response within 30 minutes please call the above phone number  Total Time: 118 Billing based on MDM: High  Palliative Medicine Team providers are available by phone from 7am to 7pm daily and can be reached through the team cell phone.  Should this patient require assistance outside of these hours,  please call the patient's attending physician.  "

## 2024-05-09 NOTE — Progress Notes (Signed)
 Patient's niece dropped off a satchel/bag with a quran, religious documents, necklace, wallet. Patient also had right earring removed and placed in denture cup with other valuables.

## 2024-05-09 NOTE — Progress Notes (Signed)
 "    Advanced Heart Failure Rounding Note  Cardiologist: Vina Gull, MD  [facility] Cardiologist: Dr. Zenaida  Patient Profile   Cody Guzman is a 59 y.o. male with an extensive past medical history significant for non-Hodgkin's lymphoma, coronary artery disease with MI in 2014, atrial flutter/A-fib, CVA, cirrhosis, hx GI bleeding and chronic biventricular heart failure with prior CRT-D placement who presents with shock and MSOF.   Significant events:   05/05/24: Presented with profound shock. Lactic acid 13. K 7.2 and Scr 5.4. Started CRRT and multiple vasopressors.  Subjective:     Remains on Norepi at 64 and Vaso at 0.04. No co-ox, current access is femoral HD line.   Lactic acid down to 2.2.   Continues on CRRT  Lethargic. Arousable, unable to comprehend speech.    Objective:   Weight Range: 83.3 kg Body mass index is 27.92 kg/m.   Vital Signs:   Temp:  [98 F (36.7 C)-98.4 F (36.9 C)] 98.4 F (36.9 C) (01/05 0315) Pulse Rate:  [49-209] 86 (01/05 0630) Resp:  [12-30] 20 (01/05 0630) SpO2:  [76 %-100 %] 96 % (01/05 0600) Arterial Line BP: (86-132)/(44-64) 104/61 (01/05 0630) Weight:  [83.3 kg] 83.3 kg (01/05 0158) Last BM Date :  (pta)  Weight change: Filed Weights   05/06/24 1000 05/08/24 0459 05/09/24 0158  Weight: 88.4 kg 84.6 kg 83.3 kg    Intake/Output:   Intake/Output Summary (Last 24 hours) at 05/09/2024 0651 Last data filed at 05/09/2024 0600 Gross per 24 hour  Intake 3131.85 ml  Output 4968.4 ml  Net -1836.55 ml     Physical Exam   General:  Acute on chronically ill appearing.   Cor: Irregular rhythm, tachy. No murmurs.  Lungs: breathing nonlabored Extremities: 1-2+ generalized edema  Telemetry   Afib, improved BiV pacing with amiodarone  gtt  Labs   CBC Recent Labs    05/08/24 0458 05/08/24 1121 05/08/24 2258 05/09/24 0308  WBC 16.0*  --   --  18.4*  HGB 8.4*   < > 8.5* 8.5*  HCT 25.4*   < > 26.3* 26.0*  MCV 74.3*  --   --  74.7*   PLT 121*  --   --  118*   < > = values in this interval not displayed.   Basic Metabolic Panel Recent Labs    98/95/73 0458 05/08/24 1002 05/08/24 1749 05/08/24 2258 05/09/24 0308  NA 135   < > 134*  133* 135 135  K 4.1   < > 4.2  4.1 4.6 4.2  CL 102   < > 101  101 102 102  CO2 21*   < > 21*  21* 22 22  GLUCOSE 105*   < > 120*  120* 123* 120*  BUN 17   < > 14  14 14 13   CREATININE 1.52*   < > 1.41*  1.40* 1.47* 1.47*  CALCIUM  7.8*   < > 7.9*  7.8* 7.9* 7.9*  MG 2.0  --   --   --  2.2  PHOS 2.7  --  2.2*  --  2.8   < > = values in this interval not displayed.   Liver Function Tests Recent Labs    05/08/24 0458 05/08/24 1749 05/09/24 0308  AST 1,031*  --  647*  ALT 630*  --  552*  ALKPHOS 177*  --  185*  BILITOT 11.4*  --  12.6*  PROT 5.7*  --  5.9*  ALBUMIN 3.0* 2.9* 3.0*  No results for input(s): LIPASE, AMYLASE in the last 72 hours. Cardiac Enzymes No results for input(s): CKTOTAL, CKMB, CKMBINDEX, TROPONINI in the last 72 hours.  BNP: BNP (last 3 results) Recent Labs    01/16/24 0512 03/09/24 2139 03/14/24 1848  BNP 812.0* 564.3* 1,158.9*    ProBNP (last 3 results) Recent Labs    05/06/24 1101  PROBNP 12,378.0*     D-Dimer No results for input(s): DDIMER in the last 72 hours. Hemoglobin A1C No results for input(s): HGBA1C in the last 72 hours. Fasting Lipid Panel No results for input(s): CHOL, HDL, LDLCALC, TRIG, CHOLHDL, LDLDIRECT in the last 72 hours. Medications:   Scheduled Medications:  Chlorhexidine  Gluconate Cloth  6 each Topical Daily   insulin  aspart  0-6 Units Subcutaneous Q4H   topiramate   25 mg Oral Daily    Infusions:  acetylcysteine  6.25 mg/kg/hr (05/09/24 0600)   amiodarone  30 mg/hr (05/09/24 0600)   ceFEPime  (MAXIPIME ) IV Stopped (05/08/24 2335)   dexmedetomidine  (PRECEDEX ) IV infusion 1 mcg/kg/hr (05/09/24 0600)   heparin  10,000 units/ 20 mL infusion syringe 500 Units/hr (05/08/24  2215)   metronidazole  Stopped (05/08/24 2208)   norepinephrine  (LEVOPHED ) Adult infusion 23 mcg/min (05/09/24 0600)   octreotide  (SANDOSTATIN ) 500 mcg in sodium chloride  0.9 % 250 mL (2 mcg/mL) infusion 50 mcg/hr (05/09/24 0600)   prismasol  BGK 4/2.5 400 mL/hr at 05/08/24 2309   prismasol  BGK 4/2.5 1,500 mL/hr at 05/09/24 0439   prismasol  BGK 4/2.5 400 mL/hr at 05/08/24 2309   vasopressin  0.04 Units/min (05/09/24 0600)    PRN Medications: heparin , ondansetron  (ZOFRAN ) IV, mouth rinse, sodium chloride  flush  Assessment/Plan   Shock: Suspect largely distributive shock given exam,  wide pulse pressure also with history of suspected cirrhosis. Co-ox elevated but from femoral HD line - His atrial fibrillation is likely contributing to cardiac dysfunction (only 26% pacing given AF burden).  - No clear source of infection. Staph epi in blood culture likely contaminant. Empiric abx per CCM. No fever - Continue amiodarone  gtt to control rate in atrial fibrillation - May be able to increase pacing rate if difficulty coming off pressors - Continue NE and vaso for now, no indication for inotropes currently. RHC today to definitively assess hemodynamics - Tolerating volume removal with CRRT  Biventricular HFrEF/NICM -HF dating back to 2014. Previous crack cocaine use. EF has been severely reduced for the last 5 years.  -Has CRT-D but not effectively BiV pacing while in atrial fibrillation/flutter. Pacing somewhat improved from yesterday with IV amiodarone . Note has LBBp rather than CS lead.    LV thrombus: Multiple noted, unfortunately with concern for GI bleeding holding anticoagulation. - Start heparin  gtt today. Hgb stable.   Afib: Rate controlled currently, occasional BiV pacing. - Would ideally like to get him back in SR. Grade I varices, would need GI clearance prior to TEE  - Start heparin  gtt today - Continue IV amiodarone .  - Long term AVN ablation may be beneficial, but given  renal/liver failure may not be a candidate   ARF:  - On CRRT, nephrology following - Continue to pull judicious fluid   Acute liver failure/shock liver:  - On octreodide, NAC X 3 days, GI has seen - Off PPI  Hx GI bleeding Anemia  - 1 u RBCs on admission, Hgb now stable in 8s - Reported bleeding prior to admission but none witnessed since arrival - GI has seen. No plan for endoscopy given critical illness  Length of Stay: 3  Gordon Vandunk N,  PA-C  05/09/2024, 6:51 AM  Advanced Heart Failure Team Pager [phone] (M-F; 7a - 5p)   Please visit [email or web address]: For overnight coverage please call cardiology fellow first. If fellow not available call Shock/ECMO MD on call.  For ECMO / Mechanical Support (Impella, IABP, LVAD) issues call Shock / ECMO MD on call.   "

## 2024-05-09 NOTE — Progress Notes (Signed)
 LUE venous duplex has been completed.  Preliminary results given to Harlene, RN and Tinnie Furth, PA-C.   Results can be found under chart review under CV PROC. 05/09/2024 4:06 PM Tenise Stetler RVT, RDMS

## 2024-05-09 NOTE — Procedures (Signed)
 Cortrak  Person Inserting Tube:  Elihue Josette RAMAN, RD Tube Type:  Cortrak - 43 inches Tube Size:  10 Tube Location:  Left nare Secured by: Bridle Technique Used to Measure Tube Placement:  Marking at nare/corner of mouth Cortrak Secured At:  70 cm Initial Placement Verification:  Xray  Cortrak Tube Team Note:  Consult received to place a Cortrak feeding tube.   X ray required to confirm placement. RN can begin using tube once placement confirmed.   If the tube becomes dislodged please keep the tube and contact the Cortrak team at [email or web address] for replacement.  If after hours and replacement cannot be delayed, place a NG tube and confirm placement with an abdominal x-ray.    Josette Elihue, MS, RDN, LDN Clinical Dietitian I Please reach out via secure chat

## 2024-05-09 NOTE — Progress Notes (Addendum)
 PHARMACY - ANTICOAGULATION CONSULT NOTE  Pharmacy Consult:  Heparin  Indication:  Afib and new LV thrombus  Allergies[1]  Patient Measurements: Weight: 83.3 kg (183 lb 10.3 oz) Heparin  dosing weight = 83 kg  Vital Signs: Temp: 98.2 F (36.8 C) (01/05 0743) Temp Source: Axillary (01/05 0743) Pulse Rate: 86 (01/05 0630)  Labs: Recent Labs    05/07/24 0310 05/07/24 1100 05/08/24 0458 05/08/24 1002 05/08/24 1749 05/08/24 2258 05/09/24 0308  HGB 7.7*   < > 8.4*   < > 8.4* 8.5* 8.5*  HCT 23.1*   < > 25.4*   < > 25.9* 26.3* 26.0*  PLT 151  --  121*  --   --   --  118*  APTT  --   --  55*  --   --   --  51*  LABPROT 34.1*  --  25.8*  --   --   --  22.4*  INR 3.2*  --  2.2*  --   --   --  1.9*  CREATININE 3.11*   < > 1.52*   < > 1.41*  1.40* 1.47* 1.47*   < > = values in this interval not displayed.    Estimated Creatinine Clearance: 56.2 mL/min (A) (by C-G formula based on SCr of 1.47 mg/dL (H)).   Assessment: 25 YOM presented with lower GIB, AoC renal failure, liver failure and RV failure.  Patient was on Eliquis  for history of Afib and reversed with KCentra  and Vitamin K  on 1/2.  GIB ruled out, Afib returned, and new LV thrombus, so Pharmacy consulted to manage IV heparin .  Aware H/H low, platelet count trending down.  No bleeding reported.  Goal of Therapy:  Heparin  level 0.3-0.5 units/ml Monitor platelets by anticoagulation protocol: Yes   Plan:  D/C heparin  through CRRT per discussion with Renal No bolus  Start IV heparin  at 1050 units/hr Check 8 hr heparin  level Daily heparin  level and CBC Monitor closely for s/sx of bleeding  Hiromi Knodel D. L[place]ell, PharmD, BCPS, BCCCP 05/09/2024, 9:54 AM  ==========================  Addendum: Heparin  was not started prior to cath OK to resume per HF team - resume at previous dose  Meighan Treto D. Lendell, PharmD, BCPS, BCCCP 05/09/2024, 2:13 PM     [1] No Known Allergies

## 2024-05-09 NOTE — Plan of Care (Signed)
" °  Problem: Fluid Volume: Goal: Ability to maintain a balanced intake and output will improve Outcome: Progressing   Problem: Metabolic: Goal: Ability to maintain appropriate glucose levels will improve Outcome: Progressing   Problem: Nutritional: Goal: Maintenance of adequate nutrition will improve Outcome: Progressing   Problem: Skin Integrity: Goal: Risk for impaired skin integrity will decrease Outcome: Progressing   Problem: Nutrition: Goal: Adequate nutrition will be maintained Outcome: Progressing   Problem: Coping: Goal: Level of anxiety will decrease Outcome: Progressing   Problem: Elimination: Goal: Will not experience complications related to bowel motility Outcome: Progressing Goal: Will not experience complications related to urinary retention Outcome: Progressing   Problem: Pain Managment: Goal: General experience of comfort will improve and/or be controlled Outcome: Progressing   Problem: Safety: Goal: Ability to remain free from injury will improve Outcome: Progressing   Problem: Skin Integrity: Goal: Risk for impaired skin integrity will decrease Outcome: Progressing   Problem: Cardiovascular: Goal: Ability to achieve and maintain adequate cardiovascular perfusion will improve Outcome: Progressing Goal: Vascular access site(s) Level 0-1 will be maintained Outcome: Progressing     Problem: Coping: Goal: Ability to adjust to condition or change in health will improve Outcome: Not Progressing   Problem: Tissue Perfusion: Goal: Adequacy of tissue perfusion will improve Outcome: Not Progressing   Problem: Clinical Measurements: Goal: Ability to maintain clinical measurements within normal limits will improve Outcome: Not Progressing Goal: Diagnostic test results will improve Outcome: Not Progressing Goal: Respiratory complications will improve Outcome: Not Progressing Goal: Cardiovascular complication will be avoided Outcome: Not  Progressing   Problem: Activity: Goal: Risk for activity intolerance will decrease Outcome: Not Progressing   "

## 2024-05-09 NOTE — Progress Notes (Signed)
 ABG results are not crossing over.  ABG @ 1406 before bipap: 7.37/41.9/348/24.5  ABG after bipap @1520 : 7.39/37.4/161/22.9  Patient taken off bipap at this time for cortrak placement.  RT will continue to monitor as needed.

## 2024-05-09 NOTE — TOC Progression Note (Signed)
 Transition of Care [facility]) - Progression Note    Patient Details  Name: Cody Guzman MRN: [id number] Date of Birth: 1965/11/03  Transition of Care [facility]) CM/SW Contact  Tom-Johnson, Jarielys Girardot Daphne, RN Phone Number: 05/09/2024, 1:13 PM  Clinical Narrative:     Patient scheduled fro Cardiac cath today 05/09/24. Continues CRRT, Nephrology following. On Precedex , Levo, Versed , IV abx, Amiodarone , Heparin  gtt.   Patient not Medically ready for discharge.  CM will continue to follow as patient progresses with care towards discharge.                      Expected Discharge Plan and Services                                               Social Drivers of Health (SDOH) Interventions SDOH Screenings   Food Insecurity: No Food Insecurity (01/12/2024)  Housing: Low Risk (01/12/2024)  Transportation Needs: Unmet Transportation Needs (01/12/2024)  Utilities: Not At Risk (01/12/2024)  Alcohol  Screen: Low Risk (01/13/2023)  Depression (PHQ2-9): High Risk (08/17/2023)  Financial Resource Strain: Low Risk (01/13/2023)  Physical Activity: Insufficiently Active (01/13/2023)  Social Connections: Moderately Integrated (01/13/2023)  Stress: No Stress Concern Present (01/13/2023)  Tobacco Use: High Risk (05/05/2024)  Health Literacy: Adequate Health Literacy (01/13/2023)    Readmission Risk Interventions     No data to display

## 2024-05-09 NOTE — Progress Notes (Signed)
 Interval note:  Back from RHC. Low PAPi <1 . Thermal CI 1.96. Starting dobutamine  @ 5mcg/kg/min. AHF kind to place CVC in Right internal jugular in cath lab. Will obtain Coox @ 1600. Can transduce CVP continuously. He is a bit somnolent afterward. Turned off dex for now. He is on BiPAP. SpO2 was having difficulty picking up so obtained a ABG which is reassuring.  Tinnie FORBES Furth, PA-C [facility] 05/09/2024 2:16 PM  Please see [email or web address] for pager details.  From 7A-7P if no response, please call [phone] After hours, please call ELink [phone]

## 2024-05-09 NOTE — TOC Progression Note (Addendum)
 Transition of Care [facility]) - Progression Note    Patient Details  Name: Cody Guzman MRN: [id number] Date of Birth: [birth year]  [facility]) CM/SW Contact  Arlana JINNY Moats, LCSWA Phone Number: 05/09/2024, 4:13 PM  Clinical Narrative:   HF CSW received a message via secure chat from NP Olivia about assisting with location [facility] for patient.   4:08 PM- HF CSW called and spoke with the patients Niece, [name]. [name] stated that she has access to the patients phone and has attempted to reach everyone but no one is answering the phone. [name] stated that she has left VMs and hoping for calls back. [name] stated that she found a cousin via Facebook.  [name] stated that she believes he has 7 children but do not know any of their names or how to get in contact with them. [name] stated that she is going to continue to try and make contact with his children and let CSW know.   4:11 PM- HF CSW called the patients daughter, [name] Ernst [phone] who lives in [place]. [name] stated that she has a great relationship with the patient and plans to be in attendance for the meeting tomorrow. [name] stated that she does not know any of her other sibling names or how to find any contact information. [name] stated that they all have different mothers   4:27 PM- HF CSW called the patients brother, Arliss who lives in [place].  Mozzy stated that he does not know any of the patients children by name or have contact information. Mozzy provided the CSW with the patients nephew name and phone number. Eli [phone].   4:30 PM- HF CSW called Eli [phone]. He did not answer will continue to make attempts to reach him.   HF CSW/CM will continue to follow and monitor for dc readiness.                       Expected Discharge Plan and Services                                               Social Drivers of Health (SDOH) Interventions SDOH Screenings   Food Insecurity: No Food  Insecurity (01/12/2024)  Housing: Low Risk (01/12/2024)  Transportation Needs: Unmet Transportation Needs (01/12/2024)  Utilities: Not At Risk (01/12/2024)  Alcohol  Screen: Low Risk (01/13/2023)  Depression (PHQ2-9): High Risk (08/17/2023)  Financial Resource Strain: Low Risk (01/13/2023)  Physical Activity: Insufficiently Active (01/13/2023)  Social Connections: Moderately Integrated (01/13/2023)  Stress: No Stress Concern Present (01/13/2023)  Tobacco Use: High Risk (05/05/2024)  Health Literacy: Adequate Health Literacy (01/13/2023)    Readmission Risk Interventions     No data to display

## 2024-05-09 NOTE — IPAL (Signed)
" °  Interdisciplinary Goals of Care Family Meeting   Date carried out: 05/09/2024  Location of the meeting: Phone conference  Member's involved: Physician and Family Member or next of kin  Durable Power of Attorney or acting medical decision maker: [name] Ernst, daughter   Discussion: We discussed goals of care for Cody Guzman. Contacted Shonta by phone. She lives in [place] . There are 2 other adult children who we have been unable to locate or get in contact with. I reviewed current status. Patient is critically ill and I believe is actively dying. He is in refractory shock from RV failure, cirrhosis, renal failure. Doubtful that he will survive this admission, or even tonight. Felt intubation or CPR would be futile at this point. She is okay with changing code status to DNR-L at this time. This conversation was witnessed by Dr. Rolan Sharps. Will continue all support otherwise with inotropes, vasopressors as well as CRRT. She is unable to travel here.   Code status:   Code Status: Limited: Do not attempt resuscitation (DNR) -DNR-LIMITED -Do Not Intubate/DNI    Disposition: Continue current acute care  Time spent for the meeting: 15  Tinnie FORBES Furth, PA-C [facility] 05/09/2024 4:48 PM  Please see [email or web address] for pager details.  From 7A-7P if no response, please call [phone] After hours, please call ELink [phone]    "

## 2024-05-09 NOTE — Progress Notes (Signed)
 RT obtained ABG results as follows: on [place] 7.35/41.7/140/24.3  RT will continue to monitor.

## 2024-05-09 NOTE — Progress Notes (Signed)
 Dr. Cesario paged about patients HR and clarification on the dobutamine  and epi gtt. Will continue the current rate on both drips and restart the precedex  for patients agitation. Patient yelling out and moaning. Currently very confused. Will titrate precedex  for comfort of the patient.

## 2024-05-09 NOTE — Progress Notes (Signed)
 " [facility] Progress Note   Subjective:    Remains on vasopressor support.  For heart cath today  Objective Vitals:   05/09/24 0945 05/09/24 1000 05/09/24 1015 05/09/24 1212  BP:      Pulse: (!) 156 (!) 173    Resp: 20 19 16    Temp:      TempSrc:      SpO2: (!) 86% 91%  100%  Weight:       Physical Exam General: sleepy calm on precedex  Heart:RRR Lungs: coarse Abdomen: soft, mildly distended Extremities: 1-2+ diffuse  GU: foley draining amber urine Dialysis Access:  temp femoral HD catheter c/d/i  Additional Objective Labs: Basic Metabolic Panel: Recent Labs  Lab 05/08/24 0458 05/08/24 1002 05/08/24 1749 05/08/24 2258 05/09/24 0308 05/09/24 1105  NA 135   < > 134*  133* 135 135 135  K 4.1   < > 4.2  4.1 4.6 4.2 4.4  CL 102   < > 101  101 102 102 102  CO2 21*   < > 21*  21* 22 22 22   GLUCOSE 105*   < > 120*  120* 123* 120* 119*  BUN 17   < > 14  14 14 13 14   CREATININE 1.52*   < > 1.41*  1.40* 1.47* 1.47* 1.73*  CALCIUM  7.8*   < > 7.9*  7.8* 7.9* 7.9* 8.0*  PHOS 2.7  --  2.2*  --  2.8  --    < > = values in this interval not displayed.   Liver Function Tests: Recent Labs  Lab 05/07/24 0310 05/07/24 1708 05/08/24 0458 05/08/24 1749 05/09/24 0308  AST 1,448*  --  1,031*  --  647*  ALT 665*  --  630*  --  552*  ALKPHOS 157*  --  177*  --  185*  BILITOT 9.0*  --  11.4*  --  12.6*  PROT 5.5*  --  5.7*  --  5.9*  ALBUMIN 2.9*   < > 3.0* 2.9* 3.0*   < > = values in this interval not displayed.   No results for input(s): LIPASE, AMYLASE in the last 168 hours. CBC: Recent Labs  Lab 05/06/24 0250 05/06/24 1101 05/07/24 0310 05/07/24 1100 05/08/24 0458 05/08/24 1121 05/08/24 2258 05/09/24 0308 05/09/24 1105  WBC 15.8*  --  13.9*  --  16.0*  --   --  18.4*  --   NEUTROABS 13.7*  --   --   --   --   --   --   --   --   HGB 7.2*   < > 7.7*   < > 8.4*   < > 8.5* 8.5* 8.5*  HCT 23.6*   < > 23.1*   < > 25.4*   < > 26.3* 26.0*  25.9*  MCV 79.2*  --  75.0*  --  74.3*  --   --  74.7*  --   PLT 217  --  151  --  121*  --   --  118*  --    < > = values in this interval not displayed.   Blood Culture    Component Value Date/Time   SDES URINE, RANDOM 05/06/2024 1644   SPECREQUEST NONE Reflexed from [id number] 05/06/2024 1644   CULT  05/06/2024 1644    NO GROWTH Performed at [facility], [address], [place], [place] [address]    REPTSTATUS 05/07/2024 FINAL 05/06/2024 1644    Cardiac Enzymes:  No results for input(s): CKTOTAL, CKMB, CKMBINDEX, TROPONINI in the last 168 hours. CBG: Recent Labs  Lab 05/08/24 1924 05/08/24 2300 05/09/24 0311 05/09/24 0740 05/09/24 1139  GLUCAP 116* 116* 117* 95 121*   Iron  Studies: No results for input(s): IRON , TIBC, TRANSFERRIN, FERRITIN in the last 72 hours. @lablastinr3 @ Studies/Results: US  LT UPPER EXTREM LTD SOFT TISSUE NON VASCULAR Result Date: 05/09/2024 CLINICAL DATA:  Extravasation injury. EXAM: ULTRASOUND LEFT UPPER EXTREMITY LIMITED TECHNIQUE: Ultrasound examination of the upper extremity soft tissues was performed in the area of clinical concern. COMPARISON:  None Available. FINDINGS: Focused ultrasound exam was performed in the region of the left in the cubital fossa. This shows subcutaneous edema/fluid without a discrete or organized fluid collection. IMPRESSION: Subcutaneous edema/fluid in the region of the left in the cubital fossa. No discrete or organized fluid collection. Electronically Signed   By: Camellia Candle M.D.   On: 05/09/2024 06:16   Medications:  sodium chloride      amiodarone  30 mg/hr (05/09/24 1100)   [MAR Hold] ceFEPime  (MAXIPIME ) IV 200 mL/hr at 05/09/24 1100   [MAR Hold] dexmedetomidine  (PRECEDEX ) IV infusion 0.8 mcg/kg/hr (05/09/24 1224)   heparin      [MAR Hold] metronidazole  Stopped (05/09/24 1036)   [MAR Hold] norepinephrine  (LEVOPHED ) Adult infusion 22 mcg/min (05/09/24 1100)   prismasol  BGK 4/2.5 400 mL/hr at  05/08/24 2309   prismasol  BGK 4/2.5 1,500 mL/hr at 05/09/24 0802   prismasol  BGK 4/2.5 400 mL/hr at 05/08/24 2309   [facility] Hold] sodium PHOSPHATE  IVPB (in mmol)     vasopressin  0.04 Units/min (05/09/24 1100)    [MAR Hold] Chlorhexidine  Gluconate Cloth  6 each Topical Daily   [MAR Hold] insulin  aspart  0-6 Units Subcutaneous Q4H   [MAR Hold] mouth rinse  15 mL Mouth Rinse 4 times per day   [MAR Hold] pantoprazole  (PROTONIX ) IV  40 mg Intravenous QHS   sodium chloride  flush  3 mL Intravenous Q12H   [MAR Hold] topiramate   25 mg Oral Daily    Assessment 51M severe AKI with hyperkalemia and EKG changes; undifferentiated shock (cardiogenic, hemorrhagic, septic?),  HFrEF with history of ICD, history of esophageal varices presenting with GI bleed, hypothermia, persistent hypoglycemia, LFT elevation   AKI on CKD3 secondary to # acute GI bleed, cardiogenic/ septic shock (? Hemorrhagic)  - continue CRRT with all 4k bath  - stop hep in CRRT since getting systemic gtt Severe hyperkalemia with EKG changes resolved with CRRT Multifactorial shock on norepinephrine  - TTE with EF < 20% bit Co ox 70%   - RHC today ABLA/GI bleed, history of esophageal varices tx w octreotide  and norepinephrine , required transfusions but no e/o ongoing active bleeding, GI following Severe metabolic acidosis with elevated lactate improving ALF - improving, query shock liver, NAC and supportive care; Coagulopathy with elevated INR s/p vit K    Almarie Bonine MD 05/09/2024, 12:33 PM  [facility]   "

## 2024-05-09 NOTE — Progress Notes (Signed)
 "  NAMEHaldon Guzman, MRN:  [id number], DOB:  03/09/1966, LOS: 3 ADMISSION DATE:  05/05/2024, CONSULTATION DATE:  05/06/24 REFERRING MD:  Nancyann Kin, MD, CHIEF COMPLAINT:  rectal bleed  History of Present Illness:  History obtained via chart review patient is encephalopathic   59  year old male history of chronic HFrEF s/p ICD, HTN, BPAD, history of GI bleed, history of esophageal varices, and CKD stage III who presented to the ED with progressive altered mental status and 3 days of blood in stool. Initial evaluation was concerning for wide complex tachyarrhythmia and he was given calcium . He was also found to be hypothermic, hypoglycemic and hypotensive with acute renal failure. He was started on levophed  and a trialysis line was placed.   Pertinent  Medical History  CAD, Cardiomyopathy s/p defibrillator, CKD, HTN, neuropathy, Atrial fib/flutter, Anemia, CHF, RBBB  Significant Hospital Events: Including procedures, antibiotic start and stop dates in addition to other pertinent events   1/2: admit to ICU on NE/vaso, CRRT started 1/5: AHF involved yesterday, to go for RHC today   Interim History / Subjective:  No events overnight, he is very encephalopathic, on CRRT, levo and vaso   Objective    Blood pressure (!) 89/64, pulse 86, temperature 98.2 F (36.8 C), temperature source Axillary, resp. rate (!) 23, weight 83.3 kg, SpO2 100%.        Intake/Output Summary (Last 24 hours) at 05/09/2024 0836 Last data filed at 05/09/2024 0800 Gross per 24 hour  Intake 3190.57 ml  Output 4713.4 ml  Net -1522.83 ml   Filed Weights   05/06/24 1000 05/08/24 0459 05/09/24 0158  Weight: 88.4 kg 84.6 kg 83.3 kg    Examination: General: critically ill appearing male, laying in bed, no distress  Lungs: coarse lung sounds bilaterally, 2 L [place]  Heart: s1s2, + murmur, feet lukewarm, non pitting edema  Abdomen: Soft, nontender, nondistended Neuro: eyes open spontaneously, states name, otherwise  repeating something incomprehensible, intermittently follows command   Resolved problem list  Wide complex tachyaryhtmia  Assessment and Plan  Shock, concern cardiogenic > distributive  Lactic acidosis  Initially felt distributive with wide pp, coox was 70 but not on appropriate line, no source of sepsis found yet. Bad RV failure with congestive hepatopathy and ongoing shock. C/f primarily cardiogenic component of his presentation.  - levo, vaso for MAP > 65  - cover sepsis with cefepime , flagyl . Vanc stopped  - RHC today with AHF +/- swan placement depending on numbers; no inotropes at present, con't above per AHF  - trend wbc, fever, lactic curves  - likely impaired lactic clearance with liver failure    Acute hypoxic respiratory failure 2/2 pulmonary edema  - volume control with CRRT  - O2 for sat > 92%  - pulm hygiene   Acute on chronic systolic HF with BiV failure  Afib/flutter  LV thrombus  1/2 echo EF <20%, global HK, IV septum flattened, equilization of pressures, severe TR, RV severely reduced and enlarged Coox 70 BNP 12,378 - AHF following, appreciate management  - RHC today  - amiodarone  gtt @ 30 for rate control  - no indication for inotrope at present  - volume control with CRRT  - start anticoagulation for LV thrombus with heparin  gtt today   Shock liver, congestive hepatopathy  Cirrhosis  GIB 2/2 varices v LGI Received 1 U PRBC, kcentra  and vitamin K . Hepatitis panel is negative. Suspect shock liver/congestive hepatopathy in setting of RV failure  - GI  sign off  - stop NAC and octreotide   - con't PPI  - hemoglobin stable   AKI on CKD  - nephrology following, appreciate help in management  - volume control with CRRT  - trend bmp, mag, phos - replete elytes - strict I&O - Avoid nephrotoxic agents, renally dose medications - ensure adequate renal perfusion   Toxic metabolic encephalopathy  ?concern for low flow state and uremic. CT head negative  - CRRT  for toxin clearance  - pressors for cerebral perfusion - RHC to eval for inotropic support as well   At risk for protein calorie malnutrition  - cortrak today, unreliable swallow and ongoing need for PO meds and nutrition  - TF after placement   Patient Lines/Drains/Airways Status     Active Line/Drains/Airways     Name Placement date Placement time Site Days   Arterial Line 05/06/24 Right Radial 05/06/24  0932  Radial  3   Peripheral IV 05/05/24 22 G Anterior;Left 05/05/24  --  --  4   Peripheral IV 05/07/24 20 G Right Antecubital 05/07/24  2051  Antecubital  2   Hemodialysis Catheter Left Femoral vein Triple lumen Temporary (Non-Tunneled) 05/06/24  0221  Femoral vein  3   Midline Single Lumen 05/06/24 Left Basilic 8 cm 0 cm 05/06/24  0105  Basilic  3   Urethral Catheter Izetta MATSU, RN Latex 16 Fr. 05/06/24  0309  Latex  3   Wound 05/06/24 1000 Other (Comment) Abdomen Left;Upper 05/06/24  1000  Abdomen  3   Wound 05/08/24 1901 Other (Comment) Arm Anterior;Left;Upper 05/08/24  1901  Arm  1           Labs   CBC: Recent Labs  Lab 05/06/24 0250 05/06/24 1101 05/07/24 0310 05/07/24 1100 05/08/24 0458 05/08/24 1121 05/08/24 1749 05/08/24 2258 05/09/24 0308  WBC 15.8*  --  13.9*  --  16.0*  --   --   --  18.4*  NEUTROABS 13.7*  --   --   --   --   --   --   --   --   HGB 7.2*   < > 7.7*   < > 8.4* 8.6* 8.4* 8.5* 8.5*  HCT 23.6*   < > 23.1*   < > 25.4* 25.7* 25.9* 26.3* 26.0*  MCV 79.2*  --  75.0*  --  74.3*  --   --   --  74.7*  PLT 217  --  151  --  121*  --   --   --  118*   < > = values in this interval not displayed.    Basic Metabolic Panel: Recent Labs  Lab 05/06/24 0433 05/06/24 1101 05/07/24 0310 05/07/24 1708 05/07/24 2311 05/08/24 0458 05/08/24 1002 05/08/24 1749 05/08/24 2258 05/09/24 0308  NA 128*   < > 133* 134*   < > 135 136 134*  133* 135 135  K 6.3*   < > 4.5 4.0   < > 4.1 4.4 4.2  4.1 4.6 4.2  CL 89*   < > 99 101   < > 102 102 101  101 102  102  CO2 12*   < > 23 23   < > 21* 23 21*  21* 22 22  GLUCOSE 105*   < > 138* 108*   < > 105* 100* 120*  120* 123* 120*  BUN 42*   < > 29* 22*   < > 17 15 14  14 14  13  CREATININE 4.79*   < > 3.11* 2.01*   < > 1.52* 1.42* 1.41*  1.40* 1.47* 1.47*  CALCIUM  9.9   < > 8.8* 8.0*   < > 7.8* 7.8* 7.9*  7.8* 7.9* 7.9*  MG 2.3  --  1.7  --   --  2.0  --   --   --  2.2  PHOS 5.3*  --   --  1.9*  --  2.7  --  2.2*  --  2.8   < > = values in this interval not displayed.   GFR: Estimated Creatinine Clearance: 56.2 mL/min (A) (by C-G formula based on SCr of 1.47 mg/dL (H)). Recent Labs  Lab 05/06/24 0250 05/06/24 0647 05/06/24 2016 05/06/24 2311 05/07/24 0310 05/08/24 0458 05/09/24 0308  WBC 15.8*  --   --   --  13.9* 16.0* 18.4*  LATICACIDVEN  --    < > 4.6* 3.8* 2.8*  --  2.2*   < > = values in this interval not displayed.    Liver Function Tests: Recent Labs  Lab 05/06/24 0053 05/06/24 0433 05/07/24 0310 05/07/24 1708 05/08/24 0458 05/08/24 1749 05/09/24 0308  AST 1,885*  --  1,448*  --  1,031*  --  647*  ALT 572*  --  665*  --  630*  --  552*  ALKPHOS 136*  --  157*  --  177*  --  185*  BILITOT 6.0*  --  9.0*  --  11.4*  --  12.6*  PROT 5.1*  --  5.5*  --  5.7*  --  5.9*  ALBUMIN 2.6*   < > 2.9* 3.0* 3.0* 2.9* 3.0*   < > = values in this interval not displayed.   No results for input(s): LIPASE, AMYLASE in the last 168 hours. Recent Labs  Lab 05/06/24 0053  AMMONIA 39*    ABG    Component Value Date/Time   PHART 7.371 05/06/2024 1110   PCO2ART 27.1 (L) 05/06/2024 1110   PO2ART 85 05/06/2024 1110   HCO3 15.8 (L) 05/06/2024 1110   TCO2 17 (L) 05/06/2024 1110   ACIDBASEDEF 9.0 (H) 05/06/2024 1110   O2SAT 70.5 05/07/2024 1312     Coagulation Profile: Recent Labs  Lab 05/06/24 0053 05/07/24 0310 05/08/24 0458 05/09/24 0308  INR 5.9* 3.2* 2.2* 1.9*    Cardiac Enzymes: No results for input(s): CKTOTAL, CKMB, CKMBINDEX, TROPONINI in the last  168 hours.  HbA1C: Hgb A1c MFr Bld  Date/Time Value Ref Range Status  05/06/2024 02:50 AM 5.6 4.8 - 5.6 % Final    Comment:    Repeated to verify  (NOTE) Diagnosis of Diabetes The following HbA1c ranges recommended by the American Diabetes Association (ADA) may be used as an aid in the diagnosis of diabetes mellitus.  Hemoglobin             Suggested A1C NGSP%              Diagnosis  <5.7                   Non Diabetic  5.7-6.4                Pre-Diabetic  >6.4                   Diabetic  <7.0                   Glycemic control for  adults with diabetes.    08/27/2020 11:50 AM 5.9 (H) 4.8 - 5.6 % Final    Comment:    (NOTE) Pre diabetes:          5.7%-6.4%  Diabetes:              >6.4%  Glycemic control for   <7.0% adults with diabetes     CBG: Recent Labs  Lab 05/08/24 1529 05/08/24 1924 05/08/24 2300 05/09/24 0311 05/09/24 0740  GLUCAP 95 116* 116* 117* 95   CC time:  42  The patient is critically ill with multiple organ system failure and requires high complexity decision making for assessment and support, frequent evaluation and titration of therapies, advanced monitoring, review of radiographic studies and interpretation of complex data.    Critical Care Time devoted to patient care services, exclusive of separately billable procedures, described in this note is 68   Tinnie FORBES Adolph DEVONNA [facility] 05/09/2024 8:36 AM  Please see [email or web address] for pager details.  From 7A-7P if no response, please call [phone] After hours, please call ELink [phone]          "

## 2024-05-09 NOTE — Progress Notes (Signed)
 Daughter updated via phone. Appreciative of care patient is receiving. Informed daughter I would call her with any change. Daughter aware that patient probably won't make it through the night.

## 2024-05-09 NOTE — Interval H&P Note (Signed)
 History and Physical Interval Note:  05/09/2024 12:23 PM  Cody Guzman  has presented today for surgery, with the diagnosis of Shock.  The various methods of treatment have been discussed with the patient and family. After consideration of risks, benefits and other options for treatment, the patient has consented to  Procedures: RIGHT HEART CATH (N/A) as a surgical intervention.  The patient's history has been reviewed, patient examined, no change in status, stable for surgery.  I have reviewed the patient's chart and labs.  Questions were answered to the patient's satisfaction.     [name]

## 2024-05-10 ENCOUNTER — Encounter (HOSPITAL_COMMUNITY): Payer: Self-pay | Admitting: Cardiology

## 2024-05-10 DIAGNOSIS — Z66 Do not resuscitate: Secondary | ICD-10-CM

## 2024-05-10 DIAGNOSIS — Z515 Encounter for palliative care: Secondary | ICD-10-CM | POA: Diagnosis not present

## 2024-05-10 DIAGNOSIS — Z7189 Other specified counseling: Secondary | ICD-10-CM | POA: Diagnosis not present

## 2024-05-10 LAB — CBC
HCT: 22.7 % — ABNORMAL LOW (ref 39.0–52.0)
Hemoglobin: 7.6 g/dL — ABNORMAL LOW (ref 13.0–17.0)
MCH: 24.9 pg — ABNORMAL LOW (ref 26.0–34.0)
MCHC: 33.5 g/dL (ref 30.0–36.0)
MCV: 74.4 fL — ABNORMAL LOW (ref 80.0–100.0)
Platelets: 107 K/uL — ABNORMAL LOW (ref 150–400)
RBC: 3.05 MIL/uL — ABNORMAL LOW (ref 4.22–5.81)
RDW: 24.8 % — ABNORMAL HIGH (ref 11.5–15.5)
WBC: 20.2 K/uL — ABNORMAL HIGH (ref 4.0–10.5)
nRBC: 5.5 % — ABNORMAL HIGH (ref 0.0–0.2)

## 2024-05-10 LAB — HEMOGLOBIN AND HEMATOCRIT, BLOOD
HCT: 23.1 % — ABNORMAL LOW (ref 39.0–52.0)
HCT: 24.1 % — ABNORMAL LOW (ref 39.0–52.0)
HCT: 23.6 % — ABNORMAL LOW (ref 39.0–52.0)
Hemoglobin: 7.6 g/dL — ABNORMAL LOW (ref 13.0–17.0)
Hemoglobin: 7.8 g/dL — ABNORMAL LOW (ref 13.0–17.0)
Hemoglobin: 7.7 g/dL — ABNORMAL LOW (ref 13.0–17.0)

## 2024-05-10 LAB — APTT
aPTT: 141 s — ABNORMAL HIGH (ref 24–36)
aPTT: >200 s (ref 24–36)

## 2024-05-10 LAB — POCT I-STAT 7, (LYTES, BLD GAS, ICA,H+H)
Acid-base deficit: 1 mmol/L (ref 0.0–2.0)
Acid-base deficit: 2 mmol/L (ref 0.0–2.0)
Acid-base deficit: 1 mmol/L (ref 0.0–2.0)
Bicarbonate: 24.5 mmol/L (ref 20.0–28.0)
Bicarbonate: 22.9 mmol/L (ref 20.0–28.0)
Bicarbonate: 24.2 mmol/L (ref 20.0–28.0)
Calcium, Ion: 1.03 mmol/L — ABNORMAL LOW (ref 1.15–1.40)
Calcium, Ion: 1.01 mmol/L — ABNORMAL LOW (ref 1.15–1.40)
Calcium, Ion: 1.07 mmol/L — ABNORMAL LOW (ref 1.15–1.40)
HCT: 30 % — ABNORMAL LOW (ref 39.0–52.0)
HCT: 29 % — ABNORMAL LOW (ref 39.0–52.0)
HCT: 29 % — ABNORMAL LOW (ref 39.0–52.0)
Hemoglobin: 10.2 g/dL — ABNORMAL LOW (ref 13.0–17.0)
Hemoglobin: 9.9 g/dL — ABNORMAL LOW (ref 13.0–17.0)
Hemoglobin: 9.9 g/dL — ABNORMAL LOW (ref 13.0–17.0)
O2 Saturation: 100 %
O2 Saturation: 99 %
O2 Saturation: 99 %
Patient temperature: 98
Patient temperature: 97.3
Patient temperature: 97.3
Potassium: 5.1 mmol/L (ref 3.5–5.1)
Potassium: 4.5 mmol/L (ref 3.5–5.1)
Potassium: 4.7 mmol/L (ref 3.5–5.1)
Sodium: 137 mmol/L (ref 135–145)
Sodium: 136 mmol/L (ref 135–145)
Sodium: 136 mmol/L (ref 135–145)
TCO2: 26 mmol/L (ref 22–32)
TCO2: 24 mmol/L (ref 22–32)
TCO2: 25 mmol/L (ref 22–32)
pCO2 arterial: 41.9 mmHg (ref 32–48)
pCO2 arterial: 37.4 mmHg (ref 32–48)
pCO2 arterial: 41.7 mmHg (ref 32–48)
pH, Arterial: 7.373 (ref 7.35–7.45)
pH, Arterial: 7.391 (ref 7.35–7.45)
pH, Arterial: 7.368 (ref 7.35–7.45)
pO2, Arterial: 348 mmHg — ABNORMAL HIGH (ref 83–108)
pO2, Arterial: 161 mmHg — ABNORMAL HIGH (ref 83–108)
pO2, Arterial: 140 mmHg — ABNORMAL HIGH (ref 83–108)

## 2024-05-10 LAB — COMPREHENSIVE METABOLIC PANEL WITH GFR
ALT: 412 U/L — ABNORMAL HIGH (ref 0–44)
AST: 361 U/L — ABNORMAL HIGH (ref 15–41)
Albumin: 2.8 g/dL — ABNORMAL LOW (ref 3.5–5.0)
Alkaline Phosphatase: 208 U/L — ABNORMAL HIGH (ref 38–126)
Anion gap: 13 (ref 5–15)
BUN: 14 mg/dL (ref 6–20)
CO2: 20 mmol/L — ABNORMAL LOW (ref 22–32)
Calcium: 7.8 mg/dL — ABNORMAL LOW (ref 8.9–10.3)
Chloride: 103 mmol/L (ref 98–111)
Creatinine, Ser: 1.88 mg/dL — ABNORMAL HIGH (ref 0.61–1.24)
GFR, Estimated: 40 mL/min — ABNORMAL LOW
Glucose, Bld: 133 mg/dL — ABNORMAL HIGH (ref 70–99)
Potassium: 4.1 mmol/L (ref 3.5–5.1)
Sodium: 136 mmol/L (ref 135–145)
Total Bilirubin: 12.1 mg/dL — ABNORMAL HIGH (ref 0.0–1.2)
Total Protein: 5.7 g/dL — ABNORMAL LOW (ref 6.5–8.1)

## 2024-05-10 LAB — HEPARIN LEVEL (UNFRACTIONATED)
Heparin Unfractionated: <0.1 [IU]/mL — ABNORMAL LOW (ref 0.30–0.70)
Heparin Unfractionated: 0.13 [IU]/mL — ABNORMAL LOW (ref 0.30–0.70)
Heparin Unfractionated: 0.29 [IU]/mL — ABNORMAL LOW (ref 0.30–0.70)

## 2024-05-10 LAB — RENAL FUNCTION PANEL
Albumin: 2.8 g/dL — ABNORMAL LOW (ref 3.5–5.0)
Anion gap: 11 (ref 5–15)
BUN: 12 mg/dL (ref 6–20)
CO2: 22 mmol/L (ref 22–32)
Calcium: 7.8 mg/dL — ABNORMAL LOW (ref 8.9–10.3)
Chloride: 102 mmol/L (ref 98–111)
Creatinine, Ser: 1.74 mg/dL — ABNORMAL HIGH (ref 0.61–1.24)
GFR, Estimated: 44 mL/min — ABNORMAL LOW
Glucose, Bld: 118 mg/dL — ABNORMAL HIGH (ref 70–99)
Phosphorus: 2.4 mg/dL — ABNORMAL LOW (ref 2.5–4.6)
Potassium: 4.4 mmol/L (ref 3.5–5.1)
Sodium: 134 mmol/L — ABNORMAL LOW (ref 135–145)

## 2024-05-10 LAB — COOXEMETRY PANEL
Carboxyhemoglobin: 1.5 % (ref 0.5–1.5)
Methemoglobin: <0.7 % (ref 0.0–1.5)
O2 Saturation: 63.2 %
Total hemoglobin: 7.8 g/dL — ABNORMAL LOW (ref 12.0–16.0)

## 2024-05-10 LAB — GLUCOSE, CAPILLARY
Glucose-Capillary: 128 mg/dL — ABNORMAL HIGH (ref 70–99)
Glucose-Capillary: 107 mg/dL — ABNORMAL HIGH (ref 70–99)
Glucose-Capillary: 93 mg/dL (ref 70–99)
Glucose-Capillary: 81 mg/dL (ref 70–99)
Glucose-Capillary: 107 mg/dL — ABNORMAL HIGH (ref 70–99)
Glucose-Capillary: 148 mg/dL — ABNORMAL HIGH (ref 70–99)

## 2024-05-10 LAB — PHOSPHORUS: Phosphorus: 3.2 mg/dL (ref 2.5–4.6)

## 2024-05-10 LAB — MAGNESIUM: Magnesium: 2.3 mg/dL (ref 1.7–2.4)

## 2024-05-10 LAB — PROTIME-INR
INR: 2 — ABNORMAL HIGH (ref 0.8–1.2)
Prothrombin Time: 23.9 s — ABNORMAL HIGH (ref 11.4–15.2)

## 2024-05-10 MED ORDER — THIAMINE MONONITRATE 100 MG PO TABS
100.0000 mg | ORAL_TABLET | Freq: Every day | ORAL | Status: DC
  Administered 2024-05-10 – 2024-05-11 (×2): 100 mg
  Filled 2024-05-10 (×2): qty 1

## 2024-05-10 MED ORDER — FENTANYL CITRATE (PF) 50 MCG/ML IJ SOSY
50.0000 ug | PREFILLED_SYRINGE | INTRAMUSCULAR | Status: DC | PRN
  Administered 2024-05-11: 50 ug via INTRAVENOUS
  Filled 2024-05-10: qty 1

## 2024-05-10 MED ORDER — FENTANYL BOLUS VIA INFUSION
100.0000 ug | INTRAVENOUS | Status: DC | PRN
  Administered 2024-05-11: 100 ug via INTRAVENOUS

## 2024-05-10 MED ORDER — FENTANYL 2500MCG IN NS 250ML (10MCG/ML) PREMIX INFUSION
100.0000 ug/h | INTRAVENOUS | Status: DC
  Administered 2024-05-11: 100 ug/h via INTRAVENOUS
  Filled 2024-05-10: qty 250

## 2024-05-10 MED ORDER — CALCIUM GLUCONATE-NACL 2-0.675 GM/100ML-% IV SOLN
2.0000 g | Freq: Once | INTRAVENOUS | Status: AC
  Administered 2024-05-10: 2000 mg via INTRAVENOUS
  Filled 2024-05-10: qty 100

## 2024-05-10 MED ORDER — POLYETHYLENE GLYCOL 3350 17 G PO PACK
17.0000 g | PACK | Freq: Two times a day (BID) | ORAL | Status: DC
  Administered 2024-05-10 (×2): 17 g
  Filled 2024-05-10 (×2): qty 1

## 2024-05-10 NOTE — Plan of Care (Signed)
" °  Problem: Education: Goal: Ability to describe self-care measures that may prevent or decrease complications (Diabetes Survival Skills Education) will improve Outcome: Progressing   Problem: Fluid Volume: Goal: Ability to maintain a balanced intake and output will improve Outcome: Progressing   Problem: Metabolic: Goal: Ability to maintain appropriate glucose levels will improve Outcome: Progressing   Problem: Tissue Perfusion: Goal: Adequacy of tissue perfusion will improve Outcome: Progressing   Problem: Clinical Measurements: Goal: Cardiovascular complication will be avoided Outcome: Progressing   "

## 2024-05-10 NOTE — Progress Notes (Signed)
 Daughter updated via phone. Told her I would call her with any change and that she could call me back anytime throughout the night to check on him. Daughter appreciative of his care.

## 2024-05-10 NOTE — Progress Notes (Signed)
 Nutrition Follow-up  DOCUMENTATION CODES:   Not applicable  INTERVENTION:  Remains clinically unstable to initiate tube feeds given high pressor requirements.   TF initiation per CCM.   Recommendations as below if/when indicated: Vital 1.5 with goal rate of 11ml/hr ( per day) *start at 15ml and advance by 10ml q24h to goal rate as tolerated 60ml ProSource TF20 BID TF at goal provides 2140 kcal, 119g protein, free water daily   Will need to monitor for refeeding labs pending resumption of nutritional intake. Consider addition of thiamine  100mg  daily.   NUTRITION DIAGNOSIS:   Inadequate oral intake related to acute illness as evidenced by NPO status. - remains applicable  GOAL:   Patient will meet greater than or equal to 90% of their needs - goal unmet  MONITOR:   Diet advancement, Labs, Weight trends, I & O's  REASON FOR ASSESSMENT:   Consult Other (Comment) (CRRT)  ASSESSMENT:   Pt admitted with progressive AMS and c/o rectal bleeding.  PMH significant for chronic HFrEF s/p ICD, HTN, GI bleed, esophageal varices and CKD stage III.  1/2: admitted with hypothermia, hypoglycemia and hypotension and ARF; CRRT initiated 1/5: RHC-significant RV failure and low cardiac index+cardiogenic shock; Cortrak placed  Patient remains oriented to person only.  Remains on pressor support with escalation over night.  In refractory shock from RV failure, cirrhosis and renal failure.  Goals of care discussion ongoing given poor candidate for advance therapies.   Continues on CRRT. Nephrology recommend fluid removal as hemodynamically tolerated.   Remains without a BM during admission.   Admit weight: 88.4 kg Current weight: 85.9 kg + deep pitting BUE; moderate pitting BLE edema  Drains/lines: Left femoral triple lumen, non-tunneled HD cath Right radial a line Right internal jugular, triple lumen CVC Cortrak  Medications: SSI 0-6 units q4h,  protonix  Drips: Amiodarone  DBA Epi @ 16 mcg/min Levo @ 40 mcg/min Vaso @ 0.04 units/min  Labs:  Cr 1.88 Corrected calcium  8.76 Albumin 2.8 Alkaline phosphatase 208 AST 361 ALT 412 Total Bilirubin 12.1 GFR 40   Diet Order:   Diet Order             Diet NPO time specified  Diet effective now                   EDUCATION NEEDS:   No education needs have been identified at this time  Skin:  Skin Assessment: Reviewed RN Assessment  Last BM:  none since admission  Height:   Ht Readings from Last 1 Encounters:  03/14/24 5' 8 (1.727 m)    Weight:   Wt Readings from Last 1 Encounters:  05/10/24 85.9 kg    Ideal Body Weight:  70 kg  BMI:  Body mass index is 28.79 kg/m.  Estimated Nutritional Needs:   Kcal:  2000-2200  Protein:  115-130g  Fluid:  1L + UOP  Royce Maris, RDN, LDN Clinical Nutrition See AMiON for contact information.

## 2024-05-10 NOTE — Progress Notes (Signed)
 Pharmacy Electrolyte Replacement  Recent Labs:  Recent Labs    05/10/24 0306  K 4.1  MG 2.3  PHOS 3.2  CREATININE 1.88*  Corrected calcium  8.76  Low Critical Values (K </= 2.5, Phos </= 1, Mg </= 1) Present: None   Plan: Administer calcium  gluconate 2g IV x1. Continue to monitor calcium .  Izetta Carl, PharmD PGY1 Pharmacy Resident

## 2024-05-10 NOTE — Progress Notes (Addendum)
 PHARMACY - ANTICOAGULATION CONSULT NOTE  Pharmacy Consult:  Heparin  Indication:  Afib, new LV thrombus, new L DVT  Allergies[1]  Patient Measurements: Weight: 85.9 kg (189 lb 6 oz) Heparin  dosing weight = 83 kg  Vital Signs: Temp: 98.1 F (36.7 C) (01/06 1123) Temp Source: Axillary (01/06 1123) Pulse Rate: 107 (01/06 1000)  Labs: Recent Labs    05/08/24 0458 05/08/24 1002 05/09/24 0308 05/09/24 1105 05/09/24 1246 05/09/24 1623 05/09/24 1726 05/09/24 2258 05/10/24 0048 05/10/24 0306 05/10/24 0604 05/10/24 1009  HGB 8.4*   < > 8.5* 8.5*   < > 8.1*   < > 7.6*  --  7.6*  --  7.6*  HCT 25.4*   < > 26.0* 25.9*   < > 25.0*   < > 23.4*  --  22.7*  --  23.1*  PLT 121*  --  118*  --   --  103*  --   --   --  107*  --   --   APTT 55*  --  51*  --   --   --   --   --  141*  --  >200*  --   LABPROT 25.8*  --  22.4*  --   --   --   --   --   --  23.9*  --   --   INR 2.2*  --  1.9*  --   --   --   --   --   --  2.0*  --   --   HEPARINUNFRC  --   --   --   --   --   --   --   --  <0.10*  --   --  0.13*  CREATININE 1.52*   < > 1.47* 1.73*  --  2.09*  --   --   --  1.88*  --   --    < > = values in this interval not displayed.    Estimated Creatinine Clearance: 44.6 mL/min (A) (by C-G formula based on SCr of 1.88 mg/dL (H)).   Assessment: 43 YOM presented with lower GIB, AoC renal failure, liver cirrhosis and RV failure.  Patient was on Eliquis  for history of Afib and reversed with KCentra  and Vitamin K  on 1/2. GIB ruled out, Afib returned, new LV thrombus and new L lower leg DVT, so Pharmacy consulted to manage IV heparin .   AM: heparin  level subtherapeutic at 0.13 on 1200 units/hr. aPTT >200, but likely elevated due to renal and hepatic impairment. Will continue to monitor based on heparin  levels. Hgb (7.6), PLT (107) both low, but stable. On discussion with RN and my evaluation of patient, heparin  infusion is running continuously and no signs/symptoms of bleeding.   Goal of  Therapy:  Heparin  level 0.3-0.5 units/ml Monitor platelets by anticoagulation protocol: Yes   Plan:  No bolus  Increase IV heparin  to 1350 units/hr Check 8 hr heparin  level Daily heparin  level and CBC Monitor closely for s/sx of bleeding  Izetta Carl, PharmD PGY1 Pharmacy Resident         [1] No Known Allergies

## 2024-05-10 NOTE — Progress Notes (Signed)
 PHARMACY - ANTICOAGULATION CONSULT NOTE  Pharmacy Consult:  Heparin  Indication:  Afib and new LV thrombus  Allergies[1]  Patient Measurements: Weight: 83.3 kg (183 lb 10.3 oz) Heparin  dosing weight = 83 kg  Vital Signs: Temp: 98.5 F (36.9 C) (01/05 2304) Temp Source: Axillary (01/05 2304) Pulse Rate: 119 (01/06 0100)  Labs: Recent Labs    05/07/24 0310 05/07/24 1100 05/08/24 0458 05/08/24 1002 05/09/24 0308 05/09/24 1105 05/09/24 1246 05/09/24 1247 05/09/24 1623 05/09/24 2258 05/10/24 0048  HGB 7.7*   < > 8.4*   < > 8.5* 8.5*   < > 10.2* 8.1* 7.6*  --   HCT 23.1*   < > 25.4*   < > 26.0* 25.9*   < > 30.0* 25.0* 23.4*  --   PLT 151  --  121*  --  118*  --   --   --  103*  --   --   APTT  --   --  55*  --  51*  --   --   --   --   --   --   LABPROT 34.1*  --  25.8*  --  22.4*  --   --   --   --   --   --   INR 3.2*  --  2.2*  --  1.9*  --   --   --   --   --   --   HEPARINUNFRC  --   --   --   --   --   --   --   --   --   --  <0.10*  CREATININE 3.11*   < > 1.52*   < > 1.47* 1.73*  --   --  2.09*  --   --    < > = values in this interval not displayed.    Estimated Creatinine Clearance: 39.6 mL/min (A) (by C-G formula based on SCr of 2.09 mg/dL (H)).   Assessment: 33 YOM presented with lower GIB, AoC renal failure, liver failure and RV failure.  Patient was on Eliquis  for history of Afib and reversed with KCentra  and Vitamin K  on 1/2.  GIB ruled out, Afib returned, and new LV thrombus, so Pharmacy consulted to manage IV heparin .  Aware H/H low, platelet count trending down.  No bleeding reported.  AM: heparin  level undetectable on 1050 units/hr. Per RN, no issues running continuously or signs/symptoms of bleeding.  Goal of Therapy:  Heparin  level 0.3-0.5 units/ml Monitor platelets by anticoagulation protocol: Yes   Plan:  No bolus  Increase IV heparin  to 1200 units/hr Check 8 hr heparin  level Daily heparin  level and CBC Monitor closely for s/sx of  bleeding  Lynwood Poplar, PharmD, BCPS Clinical Pharmacist 05/10/2024 1:33 AM        [1] No Known Allergies

## 2024-05-10 NOTE — Progress Notes (Signed)
 2027 - Spoke with daughter [name] and niece [name] on the phone. Plan to continue treatment through the night. Should patients condition change and he begin to worsen move forward with comfort care measure. Niece and daughter asked to be contacted at that point. If patient remains the same over night niece will be coming in tomorrow to move forward with comfort care.

## 2024-05-10 NOTE — Progress Notes (Signed)
 " Cody [facility]man Progress Note   Subjective:    RHC yesterday with low output and sig RV failure. Pressor requirements increased.  D/w Palliative, RN/ primary- getting to limit of what we can offer.  Not able to make sig progress in net- neg with UF on CRRt and nearly maxed out on pressors  Objective Vitals:   05/10/24 0900 05/10/24 1000 05/10/24 1100 05/10/24 1123  BP:      Pulse: (!) 103 (!) 107    Resp: 16 16 (!) 22   Temp:    98.1 F (36.7 C)  TempSrc:    Axillary  SpO2: (!) 70% (!) 74%    Weight:       Physical Exam General: sleepy calm on precedex  Heart:RRR Lungs: coarse Abdomen: soft, mildly distended Extremities: 1-2+ diffuse  GU: foley draining amber urine Dialysis Access:  temp femoral HD catheter c/d/i  Additional Objective Labs: Basic Metabolic Panel: Recent Labs  Lab 05/08/24 1749 05/08/24 2258 05/09/24 0308 05/09/24 1105 05/09/24 1246 05/09/24 1623 05/09/24 1726 05/10/24 0306  NA 134*  133*   < > 135 135   < > 135 136 136  K 4.2  4.1   < > 4.2 4.4   < > 4.2 4.7 4.1  CL 101  101   < > 102 102  --  101  --  103  CO2 21*  21*   < > 22 22  --  22  --  20*  GLUCOSE 120*  120*   < > 120* 119*  --  127*  --  133*  BUN 14  14   < > 13 14  --  17  --  14  CREATININE 1.41*  1.40*   < > 1.47* 1.73*  --  2.09*  --  1.88*  CALCIUM  7.9*  7.8*   < > 7.9* 8.0*  --  8.2*  --  7.8*  PHOS 2.2*  --  2.8  --   --   --   --  3.2   < > = values in this interval not displayed.   Liver Function Tests: Recent Labs  Lab 05/08/24 0458 05/08/24 1749 05/09/24 0308 05/10/24 0306  AST 1,031*  --  647* 361*  ALT 630*  --  552* 412*  ALKPHOS 177*  --  185* 208*  BILITOT 11.4*  --  12.6* 12.1*  PROT 5.7*  --  5.9* 5.7*  ALBUMIN 3.0* 2.9* 3.0* 2.8*   No results for input(s): LIPASE, AMYLASE in the last 168 hours. CBC: Recent Labs  Lab 05/06/24 0250 05/06/24 1101 05/07/24 0310 05/07/24 1100 05/08/24 0458 05/08/24 1121 05/09/24 0308  05/09/24 1105 05/09/24 1623 05/09/24 1726 05/09/24 2258 05/10/24 0306 05/10/24 1009  WBC 15.8*  --  13.9*  --  16.0*  --  18.4*  --  18.9*  --   --  20.2*  --   NEUTROABS 13.7*  --   --   --   --   --   --   --   --   --   --   --   --   HGB 7.2*   < > 7.7*   < > 8.4*   < > 8.5*   < > 8.1*   < > 7.6* 7.6* 7.6*  HCT 23.6*   < > 23.1*   < > 25.4*   < > 26.0*   < > 25.0*   < > 23.4* 22.7* 23.1*  MCV 79.2*  --  75.0*  --  74.3*  --  74.7*  --  75.8*  --   --  74.4*  --   PLT 217  --  151  --  121*  --  118*  --  103*  --   --  107*  --    < > = values in this interval not displayed.   Blood Culture    Component Value Date/Time   SDES URINE, RANDOM 05/06/2024 1644   SPECREQUEST NONE Reflexed from [id number] 05/06/2024 1644   CULT  05/06/2024 1644    NO GROWTH Performed at [facility], [address] [place], [place] [address]    REPTSTATUS 05/07/2024 FINAL 05/06/2024 1644    Cardiac Enzymes: No results for input(s): CKTOTAL, CKMB, CKMBINDEX, TROPONINI in the last 168 hours. CBG: Recent Labs  Lab 05/09/24 1923 05/09/24 2302 05/10/24 0309 05/10/24 0738 05/10/24 1122  GLUCAP 127* 134* 128* 107* 93   Iron  Studies: No results for input(s): IRON , TIBC, TRANSFERRIN, FERRITIN in the last 72 hours. @lablastinr3 @ Studies/Results: CARDIAC CATHETERIZATION Result Date: 05/10/2024 HEMODYNAMICS: RA:       18 mmHg (mean) RV:       32/2, 18 mmHg PA:       32/16 mmHg (21 mean) PCWP: 14 mmHg (mean)    Estimated Fick CO/CI   5.03L/min, 2.56L/min/m2 Thermodilution CO/CI   3.85L/min, 1.96L/min/m2    TPG  7  mmHg     PVR  <2 Wood Units PAPi  0.89  IMPRESSION: RV predominant shock with ventricularization of the RA tracing due to severe TR Normal left sided filling pressures Moderately reduced cardiac index by TD on 22 of NE, vasopressin  0.04 RECOMMENDATIONS: Not an MCS candidate, would benefit from continued fluid removal Start dobutamine  5mcg/min for RV support Can consider  Epinephrine  as an additional pressor if needed, continue vasopressin  given cardiac cirrhosis Extremely poor prognosis. Palliative care discussions would be beneficial   DG Abd Portable 1V Result Date: 05/09/2024 EXAM: 1 VIEW XRAY OF THE ABDOMEN 05/09/2024 04:03:00 PM COMPARISON: None available. CLINICAL HISTORY: Encounter for feeding tube placement [phone] FINDINGS: LINES, TUBES AND DEVICES: Weighted enteric tube courses below diaphragm and is looped in the left upper quadrant with tip in gastric fundus. Partially imaged cardiac pacer defibrillator noted. BOWEL: Nonobstructive bowel gas pattern. SOFT TISSUES: No abnormal calcifications. BONES: No acute fracture. CHEST FINDINGS: Cardiomegaly. IMPRESSION: 1. Weighted enteric tube in appropriate position with tip in the gastric fundus. 2. Cardiomegaly. Electronically signed by: Greig Pique MD 05/09/2024 04:42 PM EST RP Workstation: [id number]   VAS US  UPPER EXTREMITY VENOUS DUPLEX Result Date: 05/09/2024 UPPER VENOUS STUDY  Patient Name:  Cody Guzman  Date of Exam:   05/09/2024 Medical Rec #: [id number]            Accession #:    [id number] Date of Birth: [birth year]             Patient Gender: M Patient Age:   59 years Exam Location:  [facility] Procedure:      VAS US  UPPER EXTREMITY VENOUS DUPLEX Referring Phys: TINNIE FURTH --------------------------------------------------------------------------------  Indications: Edema Limitations: Poor ultrasound/tissue interface, bandages and line. Comparison Study: No previous exams Performing Technologist: Jody Hill RVT, RDMS  Examination Guidelines: A complete evaluation includes B-mode imaging, spectral Doppler, color Doppler, and power Doppler as needed of all accessible portions of each vessel. Bilateral testing is considered an integral part of a complete examination. Limited examinations for reoccurring indications may be performed as noted.  Right Findings:  +-----+------------+---------+-----------+----------+--------------+ RIGHTCompressiblePhasicitySpontaneousProperties   Summary     +-----+------------+---------+-----------+----------+--------------+ IJV                                            Not visualized +-----+------------+---------+-----------+----------+--------------+ Unable to visualize right subclavian due to line placement and position of arm.  Left Findings: +----------+------------+---------+-----------+----------+--------------------+ LEFT      CompressiblePhasicitySpontaneousProperties      Summary        +----------+------------+---------+-----------+----------+--------------------+ IJV           Full       No        Yes                                   +----------+------------+---------+-----------+----------+--------------------+ Subclavian  Partial      No        Yes                                   +----------+------------+---------+-----------+----------+--------------------+ Axillary      Full       No        Yes                                   +----------+------------+---------+-----------+----------+--------------------+ Brachial                 No        Yes                   patent by                                                              color/doppler     +----------+------------+---------+-----------+----------+--------------------+ Radial        Full                                                       +----------+------------+---------+-----------+----------+--------------------+ Ulnar                                                  Not visualized    +----------+------------+---------+-----------+----------+--------------------+ Cephalic      Full                                  not well visualized  +----------+------------+---------+-----------+----------+--------------------+ Basilic                                                Not  visualized    +----------+------------+---------+-----------+----------+--------------------+  Pulsatile waveforms throughout.  Summary:  Left: Findings consistent with acute deep vein thrombosis involving the left subclavian vein. Very limited exam due to diffuse subcutaneous edema and line and bandage of mid and distal upper arm.  *See table(s) above for measurements and observations.  Diagnosing physician: Lonni Gaskins MD Electronically signed by Lonni Gaskins MD on 05/09/2024 at 4:36:06 PM.    Final    DG CHEST PORT 1 VIEW Result Date: 05/09/2024 EXAM: 1 VIEW(S) XRAY OF THE CHEST 05/09/2024 02:21:00 PM COMPARISON: 05/06/2024 CLINICAL HISTORY: Encounter for central line placement [id number] FINDINGS: LINES, TUBES AND DEVICES: Right internal jugular central venous catheter in place with tip extending into the mid to lower SVC. While the tip may be within the right atrium, it is not well visualized due to overlying cardiac leads. LUNGS AND PLEURA: No focal pulmonary opacity. No pleural effusion. No pneumothorax. HEART AND MEDIASTINUM: Left chest pacemaker/AICD with leads terminating in the right atrium and right ventricle. BONES AND SOFT TISSUES: Multilevel thoracic osteophytosis. IMPRESSION: 1. Right internal jugular central venous catheter in place with tip extending into the mid to lower SVC, the tip is possibly within the right atrium, but not well visualized due to overlying cardiac wires. No pneumothorax. A repeat radiograph of the chest with removal of the overlying cardiac leads recommended. These results will be called to the ordering clinician or representative by the radiologist assistant and communication documented in the pacs or clario dashboard. Electronically signed by: Rogelia Myers MD 05/09/2024 03:19 PM EST RP Workstation: [id number]   US  LT UPPER EXTREM LTD SOFT TISSUE NON VASCULAR Result Date: 05/09/2024 CLINICAL DATA:  Extravasation injury. EXAM: ULTRASOUND LEFT UPPER EXTREMITY LIMITED  TECHNIQUE: Ultrasound examination of the upper extremity soft tissues was performed in the area of clinical concern. COMPARISON:  None Available. FINDINGS: Focused ultrasound exam was performed in the region of the left in the cubital fossa. This shows subcutaneous edema/fluid without a discrete or organized fluid collection. IMPRESSION: Subcutaneous edema/fluid in the region of the left in the cubital fossa. No discrete or organized fluid collection. Electronically Signed   By: Camellia Candle M.D.   On: 05/09/2024 06:16   Medications:  amiodarone  30 mg/hr (05/10/24 1100)   ceFEPime  (MAXIPIME ) IV Stopped (05/10/24 0949)   dexmedetomidine  (PRECEDEX ) IV infusion 0.6 mcg/kg/hr (05/10/24 1100)   DOBUTamine  2.5 mcg/kg/min (05/10/24 1100)   epinephrine  16 mcg/min (05/10/24 1100)   heparin  1,200 Units/hr (05/10/24 1100)   metronidazole  Stopped (05/10/24 0913)   norepinephrine  (LEVOPHED ) Adult infusion 40 mcg/min (05/10/24 1100)   prismasol  BGK 4/2.5 400 mL/hr at 05/10/24 0449   prismasol  BGK 4/2.5 1,500 mL/hr at 05/10/24 0813   prismasol  BGK 4/2.5 400 mL/hr at 05/10/24 0449   vasopressin  0.04 Units/min (05/10/24 1100)    Chlorhexidine  Gluconate Cloth  6 each Topical Daily   insulin  aspart  0-6 Units Subcutaneous Q4H   pantoprazole  (PROTONIX ) IV  40 mg Intravenous QHS   polyethylene glycol  17 g Per Tube BID   thiamine   100 mg Per Tube Daily   topiramate   25 mg Oral Daily    Assessment 42M severe AKI with hyperkalemia and EKG changes; undifferentiated shock (cardiogenic, hemorrhagic, septic?),  HFrEF with history of ICD, history of esophageal varices presenting with GI bleed, hypothermia, persistent hypoglycemia, LFT elevation   AKI on CKD3 secondary to # acute GI bleed, cardiogenic/ septic shock (? Hemorrhagic)  - continue CRRT with all 4k bath  - stop hep in CRRT since getting systemic gtt Severe hyperkalemia with  EKG changes resolved with CRRT Multifactorial shock on norepinephrine  - TTE  with EF < 20% bit Co ox 70%   - RHC twith severe RV failrue and low PAPI ABLA/GI bleed, history of esophageal varices tx w octreotide  and norepinephrine , required transfusions but no e/o ongoing active bleeding, GI following Severe metabolic acidosis with elevated lactate improving ALF - improving, query shock liver, NAC and supportive care; Coagulopathy with elevated INR s/p vit K Dispo: prognosis is grim. He has MSOF.  Appreciate palliative care.    Almarie Bonine MD 05/10/2024, 11:32 AM  [facility]   "

## 2024-05-10 NOTE — Progress Notes (Signed)
 "  Palliative Medicine Inpatient Follow Up Note   HPI: 59 year old male history of chronic HFrEF s/p ICD, HTN, BPAD, history of GI bleed, history of esophageal varices, and CKD stage III who presented to the ED with progressive altered mental status and 3 days of blood in stool.  Has had a complicated hospitalization in the setting of cardiogenic shock resulting in multisystem organ failure. Palliative care has been asked to support additional goals of care conversations in the setting of significant disease burden.   Had been seen by the PMT 9/12-9/15 2025. At that time. Patient vocalized the desire for all life prolonging measures.   Today's Discussion 05/10/2024  *Please note that this is a verbal dictation therefore any spelling or grammatical errors are due to the Dragon [employer] One system interpretation.  I reviewed the chart notes including nursing notes from today, progress notes from today. I also reviewed vital signs, nursing flowsheets, medication administrations record, labs, and imaging.  I was able to assess [name] this morning.  He is more alert though his requirements from the perspective of pressor support have precipitously increased.  He is clearly suffering from delirium as he is very tangential and unable to follow a cohesive thought during conversation.  A family meeting was had at 10 AM this morning with patient's daughter [name], niece, [name], and sister, Pennie Killian.  Providers present were Dr. Zaida and myself.  Dr. Zaida was able to explain to patient's family the concerns associated with patient's hospitalization.  He shares the initial worry was related to patient's GI bleeding which started the cascade of events progressing to further clinical deterioration. He shared that patient is unfortunately suffering from multisystem organ failure inclusive of him having cardiogenic shock, liver failure, and renal failure.  He shares that unfortunately despite patient being  supported with see CRRT and multiple medications to increase blood pressure that [name]'s condition is not turning around.  The largest concerns at hand are related to patient's extremely limited cardiac function as well as kidney function.  He states that despite full aggressive efforts we are not seeing [name]'s condition turnaround and it is very unlikely that he will be able to transition out of the ICU.  The options moving forward were further discussed inclusive of continuing the aggressive modalities of care which have been pursued to date versus transitioning to a comfort focused emphasis of care. We talked about transition to comfort measures in house and what that would entail inclusive of medications to control pain, dyspnea, agitation, nausea, itching, and hiccups.    We discussed stopping all uneccessary measures such as cardiac monitoring, blood draws, blood sugar needle sticks, IV antibiotics, maintenance IV fluids, pressor support, artificial nutrition, and frequent vital signs.   Patient's family reasonably share that this is a tremendous amount of information to take in and they want to make the right decision for the patient.  Patient's next of kin is his daughter, [name] who understands the decision will be for her to make in the absence of additional siblings.  She shares understanding.  She has for some time to reflect on what the primary medical team has shared with her and determine what the best path moving forward will be.  She asked that I call her back around 3 PM so she may focus on a determination at that time. ________________________________________________________________________ Addendum:  I spoke with patient's daughter, [name] this afternoon.  She shares with me that although she wishes her dad situation would  improve she understands the severity of his ailments.  We discussed which she believes the wishes would be moving forward of her father.  She shares she does not want  him to be in pain or suffer and understands the gravity of his ailments.  She shares the difficulty of making this determination but wanting to focus on comfort and the quality of time he has left.  We discussed that once measures are stopped it will likely be a quick transition.  She asked to be called when he passes.  I met at bedside with patient's niece, [name] and discussed the determinations which had been made.  She shares understanding and agreement.  She is going to have [name]'s nephew come in and she will be present as well when care is de-escalated.  We discussed care of patient's body once he has expired. [name] shares she will speak to the family more about what type of arrangements they would prefer to make.  Plan will be to transition to comfort oriented care once patient's family have arrived to bedside.  As of presently only patient's niece and nephew will be present.  Questions and concerns addressed/Palliative Support Provided.   Objective Assessment: Vital Signs Vitals:   05/10/24 1500 05/10/24 1521  BP:    Pulse:    Resp: 19   Temp:  97.7 F (36.5 C)  SpO2:      Intake/Output Summary (Last 24 hours) at 05/10/2024 1554 Last data filed at 05/10/2024 1500 Gross per 24 hour  Intake 4050.35 ml  Output 1402 ml  Net 2648.35 ml   Last Weight  Most recent update: 05/10/2024  2:43 AM    Weight  85.9 kg (189 lb 6 oz)            Gen: Older African-American male critically ill in appearance HEENT: Core track in place, dry mucous membranes CV: Irregular rate and rhythm  PULM: On 2 L nasal cannula breathing is even nonlabored ABD: soft/nontender  EXT: Generalized edema  Neuro: Disoriented  SUMMARY OF RECOMMENDATIONS   DNAR/DNI  Patient's daughter has elected to pursue comfort care  Plan to de-escalate care once patient's niece and nephew are present at bedside  Comfort medications have been pended and can be implemented per the primary team once family are  present  Will order fentanyl  drip so it is ready at the time of transition of care  Emotional support provided by therapeutic listening  Patient's daughter, [name] would like to be informed once [name] passes away as she lives in [place]   The palliative care team will continue to follow along with [name] during hospitalization ______________________________________________________________________________________ [facility] Palliative Medicine Team Team Cell Phone: [phone] Please utilize secure chat with additional questions, if there is no response within 30 minutes please call the above phone number  Time Spent: 65  Palliative Medicine Team providers are available by phone from 7am to 7pm daily and can be reached through the team cell phone.  Should this patient require assistance outside of these hours, please call the patient's attending physician.     "

## 2024-05-10 NOTE — Progress Notes (Addendum)
 "  [name], MRN:  [id number], DOB:  1966/01/07, LOS: 4 ADMISSION DATE:  05/05/2024, CONSULTATION DATE:  05/06/24 REFERRING MD:  Cody Kin, MD, CHIEF COMPLAINT:  rectal bleed  History of Present Illness:  History obtained via chart review patient is encephalopathic   59  year old male history of chronic HFrEF s/p ICD, HTN, BPAD, history of GI bleed, history of esophageal varices, and CKD stage III who presented to the ED with progressive altered mental status and 3 days of blood in stool. Initial evaluation was concerning for wide complex tachyarrhythmia and he was given calcium . He was also found to be hypothermic, hypoglycemic and hypotensive with acute renal failure. He was started on levophed  and a trialysis line was placed.   Pertinent  Medical History  CAD, Cardiomyopathy s/p defibrillator, CKD III, HTN, neuropathy, Atrial fib/flutter, Anemia, CHF, RBBB, cirrhosis, esophageal varices, bipolar disorder, non-hodgkins lymphoma in remisson   Significant Hospital Events: Including procedures, antibiotic start and stop dates in addition to other pertinent events   1/2: admit to ICU on NE/vaso, CRRT started 1/5: AHF involved yesterday, RHC, GOC-changed to DNR/ DNI limited  Interim History / Subjective:  Oriented to self and hospital, remains on dex 1.2 Unable to pull fluid overnight, kept positive due to increasing pressor requirements, now on epi 16, NE 40, vaso 0.04, DBA 2.5 CVP 18. Coox 63  Objective    Blood pressure (!) 89/64, pulse (!) 110, temperature 98.2 F (36.8 C), temperature source Axillary, resp. rate 18, weight 85.9 kg, SpO2 (!) 74%. CVP:  [15 mmHg-19 mmHg] 18 mmHg  Vent Mode: BIPAP FiO2 (%):  [50 %] 50 % Set Rate:  [10 bmp] 10 bmp PEEP:  [6 cmH20] 6 cmH20   Intake/Output Summary (Last 24 hours) at 05/10/2024 0850 Last data filed at 05/10/2024 0800 Gross per 24 hour  Intake 3434.82 ml  Output 625 ml  Net 2809.82 ml   Filed Weights   05/08/24 0459  05/09/24 0158 05/10/24 0243  Weight: 84.6 kg 83.3 kg 85.9 kg    Examination: General:  ill appearing older male lying in bed in  NAD HEENT: MM pink/moist, pupils 2/r, icterus, cortrak Neuro: Awake, talking, very dysarthric able to understand name, hospital, follows simple commands, MAE  CV: irir, +murmur PULM:  tachypneic but non labored, coarse scattered anterior rhonchi, diminished lower GI: soft, hypoBS, foley- amber Extremities: warm/dry, generalized dependent edema UE> LE, +1LE Skin: no rashes   Afebrile Labs> coox 63, K 4.1, BUN/ sCr 14/ 1.8, WBC 18.9> 20.2, H/H 7.6/ 22.7, plts 107, INR 2, improving AST/ ALT, t.bili 12.6> 12.1  Patient Lines/Drains/Airways Status     Active Line/Drains/Airways     Name Placement date Placement time Site Days   Arterial Line 05/06/24 Right Radial 05/06/24  0932  Radial  4   Peripheral IV 05/07/24 20 G Right Antecubital 05/07/24  2051  Antecubital  3   CVC Triple Lumen 05/09/24 Right Internal jugular 20 cm 0 cm 05/09/24  1251  -- 1   Hemodialysis Catheter Left Femoral vein Triple lumen Temporary (Non-Tunneled) 05/06/24  0221  Femoral vein  4   Urethral Catheter Cody MATSU, RN Latex 16 Fr. 05/06/24  0309  Latex  4   Small Bore Feeding Tube 10 Fr. Left nare Marking at nare/corner of mouth 70 cm 05/09/24  1531  Left nare  1   Wound 05/06/24 1000 Other (Comment) Abdomen Left;Upper 05/06/24  1000  Abdomen  4   Wound 05/08/24 1901 Other (  Comment) Arm Anterior;Left;Upper 05/08/24  1901  Arm  2           Resolved problem list  Wide complex tachyaryhtmia   Assessment and Plan  Cardiogenic shock, possible/ less likely distributive component  Lactic acidosis  Initially felt distributive with wide pp, coox was 70 but not on appropriate line, no source of sepsis found yet. Bad RV failure with congestive hepatopathy and ongoing shock. C/f primarily cardiogenic component of his presentation.  P:  - cont epi, NE, vaso for MAP goal > 65.  Unfortunately  with worsening pressors requirements overnight and unable to pull fluid on CRRT with extremely poor prognosis.  PMT/ GOC meeting scheduled for this morning.  Remains DNR/ DNI.  Appreciate PMT assistance  - cont cefepime , flagyl  for now - likely impaired lactic clearance with liver failure  - follow BC (1/4 stap epi> likely contaminant), trend WBC/ fever   Acute on chronic systolic HF with BiV failure  Afib/flutter  LV thrombus  - 1/2 echo EF <20%, global HK, IV septum flattened, equilization of pressures, severe TR, RV severely reduced and enlarged - 1/5 RHC Low PAPi <1 . Thermal CI 1.96 (on NE 22, vaso), RV predominant shock w/ ventricularization of RA tracing due to severe TR, normal left sided filling pressures P:  - appreciate AHF following management  - not a candidate for MCS, recs to cont fluid removal however has not tolerated overnight - cont DBA 2.5 - trending CVP/ coox - cont amiodarone  gtt - heparin  per pharmacy- H/H stable, no signs of bleeding thus far - volume removal as hemodynamically tolerated per CRRT  Acute hypoxic respiratory failure 2/2 pulmonary edema  - DNI - cont supplemental O2 for sat goal > 92% - aspiration precautions - NPO for now, cortrak  - pulm hygiene   Shock liver, congestive hepatopathy  Cirrhosis  GIB 2/2 varices v LGI Received 1 U PRBC, kcentra  and vitamin K . Hepatitis panel is negative. Suspect shock liver/congestive hepatopathy in setting of RV failure.   GI sign off  - AST/ ALT improving, t. Bili stable, INR stable, cont to monitor - cont to monitor H/H closely on heparin  gtt> no signs of bleeding thus far - PPI  AKI on CKD  - CRRT per Nephrology - cont foley  - trend renal indices  - strict I/Os, daily wts - avoid nephrotoxins, renal dose meds, hemodynamic support as above  Toxic metabolic encephalopathy  ?concern for low flow state and uremic. CT head negative  - CRRT as above - hemodynamic support as above - cont to wean  precedex  as able   At risk for protein calorie malnutrition  - s/p cortrak 1/5.  Keep NPO given pressor requirements pending GOC - closely monitor electrolytes - thiamine   - no BM thus far> add bowel regimen   No family at bedside 1/6 am  Labs   CBC: Recent Labs  Lab 05/06/24 0250 05/06/24 1101 05/07/24 0310 05/07/24 1100 05/08/24 0458 05/08/24 1121 05/09/24 0308 05/09/24 1105 05/09/24 1246 05/09/24 1247 05/09/24 1623 05/09/24 2258 05/10/24 0306  WBC 15.8*  --  13.9*  --  16.0*  --  18.4*  --   --   --  18.9*  --  20.2*  NEUTROABS 13.7*  --   --   --   --   --   --   --   --   --   --   --   --   HGB 7.2*   < >  7.7*   < > 8.4*   < > 8.5*   < > 10.5* 10.2* 8.1* 7.6* 7.6*  HCT 23.6*   < > 23.1*   < > 25.4*   < > 26.0*   < > 31.0* 30.0* 25.0* 23.4* 22.7*  MCV 79.2*  --  75.0*  --  74.3*  --  74.7*  --   --   --  75.8*  --  74.4*  PLT 217  --  151  --  121*  --  118*  --   --   --  103*  --  107*   < > = values in this interval not displayed.    Basic Metabolic Panel: Recent Labs  Lab 05/06/24 0433 05/06/24 1101 05/07/24 0310 05/07/24 1708 05/07/24 2311 05/08/24 0458 05/08/24 1002 05/08/24 1749 05/08/24 2258 05/09/24 0308 05/09/24 1105 05/09/24 1246 05/09/24 1247 05/09/24 1623 05/10/24 0306  NA 128*   < > 133* 134*   < > 135   < > 134*  133* 135 135 135 139 139 135 136  K 6.3*   < > 4.5 4.0   < > 4.1   < > 4.2  4.1 4.6 4.2 4.4 4.2 4.2 4.2 4.1  CL 89*   < > 99 101   < > 102   < > 101  101 102 102 102  --   --  101 103  CO2 12*   < > 23 23   < > 21*   < > 21*  21* 22 22 22   --   --  22 20*  GLUCOSE 105*   < > 138* 108*   < > 105*   < > 120*  120* 123* 120* 119*  --   --  127* 133*  BUN 42*   < > 29* 22*   < > 17   < > 14  14 14 13 14   --   --  17 14  CREATININE 4.79*   < > 3.11* 2.01*   < > 1.52*   < > 1.41*  1.40* 1.47* 1.47* 1.73*  --   --  2.09* 1.88*  CALCIUM  9.9   < > 8.8* 8.0*   < > 7.8*   < > 7.9*  7.8* 7.9* 7.9* 8.0*  --   --  8.2* 7.8*  MG 2.3   --  1.7  --   --  2.0  --   --   --  2.2  --   --   --   --  2.3  PHOS 5.3*  --   --  1.9*  --  2.7  --  2.2*  --  2.8  --   --   --   --  3.2   < > = values in this interval not displayed.   GFR: Estimated Creatinine Clearance: 44.6 mL/min (A) (by C-G formula based on SCr of 1.88 mg/dL (H)). Recent Labs  Lab 05/06/24 2311 05/07/24 0310 05/08/24 0458 05/09/24 0308 05/09/24 1623 05/10/24 0306  WBC  --  13.9* 16.0* 18.4* 18.9* 20.2*  LATICACIDVEN 3.8* 2.8*  --  2.2* 2.4*  --     Liver Function Tests: Recent Labs  Lab 05/06/24 0053 05/06/24 0433 05/07/24 0310 05/07/24 1708 05/08/24 0458 05/08/24 1749 05/09/24 0308 05/10/24 0306  AST 1,885*  --  1,448*  --  1,031*  --  647* 361*  ALT 572*  --  665*  --  630*  --  552* 412*  ALKPHOS 136*  --  157*  --  177*  --  185* 208*  BILITOT 6.0*  --  9.0*  --  11.4*  --  12.6* 12.1*  PROT 5.1*  --  5.5*  --  5.7*  --  5.9* 5.7*  ALBUMIN 2.6*   < > 2.9* 3.0* 3.0* 2.9* 3.0* 2.8*   < > = values in this interval not displayed.   No results for input(s): LIPASE, AMYLASE in the last 168 hours. Recent Labs  Lab 05/06/24 0053  AMMONIA 39*    ABG    Component Value Date/Time   PHART 7.371 05/06/2024 1110   PCO2ART 27.1 (L) 05/06/2024 1110   PO2ART 85 05/06/2024 1110   HCO3 23.5 05/09/2024 1247   TCO2 25 05/09/2024 1247   ACIDBASEDEF 2.0 05/09/2024 1247   O2SAT 63.2 05/10/2024 0306     Coagulation Profile: Recent Labs  Lab 05/06/24 0053 05/07/24 0310 05/08/24 0458 05/09/24 0308 05/10/24 0306  INR 5.9* 3.2* 2.2* 1.9* 2.0*    Cardiac Enzymes: No results for input(s): CKTOTAL, CKMB, CKMBINDEX, TROPONINI in the last 168 hours.  HbA1C: Hgb A1c MFr Bld  Date/Time Value Ref Range Status  05/06/2024 02:50 AM 5.6 4.8 - 5.6 % Final    Comment:    Repeated to verify  (NOTE) Diagnosis of Diabetes The following HbA1c ranges recommended by the American Diabetes Association (ADA) may be used as an aid in the  diagnosis of diabetes mellitus.  Hemoglobin             Suggested A1C NGSP%              Diagnosis  <5.7                   Non Diabetic  5.7-6.4                Pre-Diabetic  >6.4                   Diabetic  <7.0                   Glycemic control for                       adults with diabetes.    08/27/2020 11:50 AM 5.9 (H) 4.8 - 5.6 % Final    Comment:    (NOTE) Pre diabetes:          5.7%-6.4%  Diabetes:              >6.4%  Glycemic control for   <7.0% adults with diabetes     CBG: Recent Labs  Lab 05/09/24 1547 05/09/24 1923 05/09/24 2302 05/10/24 0309 05/10/24 0738  GLUCAP 95 127* 134* 128* 107*   CRITICAL CARE Performed by: Lyle Pesa   Total critical care time: 38 minutes  Critical care time was exclusive of separately billable procedures and treating other patients.  Critical care was necessary to treat or prevent imminent or life-threatening deterioration.  Critical care was time spent personally by me on the following activities: development of treatment plan with patient and/or surrogate as well as nursing, discussions with consultants, evaluation of patient's response to treatment, examination of patient, obtaining history from patient or surrogate, ordering and performing treatments and interventions, ordering and review of laboratory studies, ordering and review of radiographic studies, pulse oximetry and re-evaluation of patient's condition.    Lyle Pesa, NP [facility] 05/10/2024, 8:50 AM  See Amion for pager If no response to pager , please call [phone] until 7pm After 7:00 pm call Elink  [phone]         "

## 2024-05-10 NOTE — Progress Notes (Signed)
 "    Advanced Heart Failure Rounding Note  Cardiologist: Vina Gull, MD  [facility] Cardiologist: Dr. Zenaida  Patient Profile   Cody Guzman is a 59 y.o. male with an extensive past medical history significant for non-Hodgkin's lymphoma, coronary artery disease with MI in 2014, atrial flutter/A-fib, CVA, cirrhosis, hx GI bleeding and chronic biventricular heart failure with prior CRT-D placement who presents with shock and MSOF.   Significant events:   05/05/24: Presented with profound shock. Lactic acid 13. K 7.2 and Scr 5.4. Started CRRT and multiple vasopressors.  Subjective:    Remains on Norepi at 40, Vaso at 0.04, Epi at 16 and DBA at 2.5. co-ox 63%.   Lactic acid 2.2>2.4.   Continues on CRRT.   Responds to speech, A&O x2-3. Repetitively states he does not want to be here.   Objective:   Weight Range: 85.9 kg Body mass index is 28.79 kg/m.   Vital Signs:   Temp:  [97.3 F (36.3 C)-98.5 F (36.9 C)] 98.2 F (36.8 C) (01/06 0742) Pulse Rate:  [0-173] 110 (01/06 0800) Resp:  [13-38] 18 (01/06 0800) SpO2:  [46 %-100 %] 74 % (01/06 0800) Arterial Line BP: (83-108)/(49-63) 93/51 (01/06 0800) FiO2 (%):  [50 %] 50 % (01/05 1406) Weight:  [85.9 kg] 85.9 kg (01/06 0243) Last BM Date :  (pta)  Weight change: Filed Weights   05/08/24 0459 05/09/24 0158 05/10/24 0243  Weight: 84.6 kg 83.3 kg 85.9 kg    Intake/Output:   Intake/Output Summary (Last 24 hours) at 05/10/2024 0823 Last data filed at 05/10/2024 0800 Gross per 24 hour  Intake 3434.82 ml  Output 625 ml  Net 2809.82 ml     Physical Exam  General:  chronically ill appearing.   Neck: JVD UTA.  Cor: Irregular rate & rhythm.   Lungs: clear, diminished bases Extremities: +2 BLE edema to posterior thighs. Non pitting upper arm edema.   Telemetry   A fib low 100s (Personally reviewed)    Labs   CBC Recent Labs    05/09/24 1623 05/09/24 2258 05/10/24 0306  WBC 18.9*  --  20.2*  HGB 8.1* 7.6* 7.6*  HCT  25.0* 23.4* 22.7*  MCV 75.8*  --  74.4*  PLT 103*  --  107*   Basic Metabolic Panel Recent Labs    98/94/73 0308 05/09/24 1105 05/09/24 1623 05/10/24 0306  NA 135   < > 135 136  K 4.2   < > 4.2 4.1  CL 102   < > 101 103  CO2 22   < > 22 20*  GLUCOSE 120*   < > 127* 133*  BUN 13   < > 17 14  CREATININE 1.47*   < > 2.09* 1.88*  CALCIUM  7.9*   < > 8.2* 7.8*  MG 2.2  --   --  2.3  PHOS 2.8  --   --  3.2   < > = values in this interval not displayed.   Liver Function Tests Recent Labs    05/09/24 0308 05/10/24 0306  AST 647* 361*  ALT 552* 412*  ALKPHOS 185* 208*  BILITOT 12.6* 12.1*  PROT 5.9* 5.7*  ALBUMIN 3.0* 2.8*   No results for input(s): LIPASE, AMYLASE in the last 72 hours. Cardiac Enzymes No results for input(s): CKTOTAL, CKMB, CKMBINDEX, TROPONINI in the last 72 hours.  BNP: BNP (last 3 results) Recent Labs    01/16/24 0512 03/09/24 2139 03/14/24 1848  BNP 812.0* 564.3* 1,158.9*  ProBNP (last 3 results) Recent Labs    05/06/24 1101  PROBNP 12,378.0*     D-Dimer No results for input(s): DDIMER in the last 72 hours. Hemoglobin A1C No results for input(s): HGBA1C in the last 72 hours. Fasting Lipid Panel No results for input(s): CHOL, HDL, LDLCALC, TRIG, CHOLHDL, LDLDIRECT in the last 72 hours. Medications:   Scheduled Medications:  Chlorhexidine  Gluconate Cloth  6 each Topical Daily   insulin  aspart  0-6 Units Subcutaneous Q4H   pantoprazole  (PROTONIX ) IV  40 mg Intravenous QHS   topiramate   25 mg Oral Daily    Infusions:  amiodarone  30 mg/hr (05/10/24 0800)   ceFEPime  (MAXIPIME ) IV Stopped (05/09/24 2327)   dexmedetomidine  (PRECEDEX ) IV infusion 1 mcg/kg/hr (05/10/24 0802)   DOBUTamine  2.5 mcg/kg/min (05/10/24 0800)   epinephrine  16 mcg/min (05/10/24 0800)   heparin  1,200 Units/hr (05/10/24 0800)   metronidazole  500 mg (05/10/24 9187)   norepinephrine  (LEVOPHED ) Adult infusion 40 mcg/min (05/10/24 0801)    prismasol  BGK 4/2.5 400 mL/hr at 05/10/24 0449   prismasol  BGK 4/2.5 1,500 mL/hr at 05/10/24 0813   prismasol  BGK 4/2.5 400 mL/hr at 05/10/24 0449   vasopressin  0.04 Units/min (05/10/24 0800)    PRN Medications: heparin , ondansetron  (ZOFRAN ) IV, mouth rinse, sodium chloride  flush  Assessment/Plan   Shock: Suspect largely distributive shock given exam,  wide pulse pressure also with history of suspected cirrhosis. Co-ox elevated but from femoral HD line - His atrial fibrillation is likely contributing to cardiac dysfunction (only 26% pacing given AF burden).  - No clear source of infection. Staph epi in blood culture likely contaminant. Empiric abx per CCM. No fever - Continue amiodarone  gtt for rate control - May be able to increase pacing rate if difficulty coming off pressors - RHC 05/09/24 with severe RV failure, ventricularized RA tracing in the setting of severe TR and RV-PA uncoupling  - Continue NE, Vaso, Epi and DBA.  - Have not been pulling much on CRRT previously with hypotension. Improving today.   Biventricular HFrEF/NICM -HF dating back to 2014. Previous crack cocaine use. EF has been severely reduced for the last 5 years.  -Has CRT-D but not effectively BiV pacing while in atrial fibrillation/flutter. Pacing somewhat improved from yesterday with IV amiodarone . Note has LBBp rather than CS lead.    LV thrombus: Multiple noted, unfortunately with concern for GI bleeding holding anticoagulation. - Continue heparin  gtt. Hgb 7.6.    Afib: Rate controlled currently, occasional BiV pacing. - Would ideally like to get him back in SR. Grade I varices, would need GI clearance prior to TEE  - Back on heparin  gtt - Continue IV amiodarone .  - Long term AVN ablation may be beneficial, but given renal/liver failure may not be a candidate   ARF:  - On CRRT, nephrology following - Continue to pull judicious fluid   Acute liver failure/shock liver:  - On octreodide, NAC X 3 days,  GI has seen - Off PPI  Hx GI bleeding Anemia  - 1 u RBCs on admission, Hgb 7.5,  - Reported bleeding prior to admission but none witnessed since arrival - Hgb 7.6 today.  - GI has seen. No plan for endoscopy given critical illness  GOC - Ongoing GOC conversations, complex family dynamics.  - Now DNR-L  CRITICAL CARE Performed by: Beckey LITTIE Coe   Total critical care time: 10 minutes  Critical care time was exclusive of separately billable procedures and treating other patients.  Critical care was necessary to treat  or prevent imminent or life-threatening deterioration.  Critical care was time spent personally by me on the following activities: development of treatment plan with patient and/or surrogate as well as nursing, discussions with consultants, evaluation of patient's response to treatment, examination of patient, obtaining history from patient or surrogate, ordering and performing treatments and interventions, ordering and review of laboratory studies, ordering and review of radiographic studies, pulse oximetry and re-evaluation of patient's condition.   Length of Stay: 4  Beckey LITTIE Coe, NP  05/10/2024, 8:23 AM  Advanced Heart Failure Team Pager [phone] (M-F; 7a - 5p)   Please visit [email or web address]: For overnight coverage please call cardiology fellow first. If fellow not available call Shock/ECMO MD on call.  For ECMO / Mechanical Support (Impella, IABP, LVAD) issues call Shock / ECMO MD on call.   "

## 2024-05-10 NOTE — Progress Notes (Signed)
 PHARMACY - ANTICOAGULATION CONSULT NOTE  Pharmacy Consult:  Heparin  Indication:  Afib, new LV thrombus, new L DVT  Allergies[1]  Patient Measurements: Weight: 85.9 kg (189 lb 6 oz) Heparin  dosing weight = 83 kg  Vital Signs: Temp: 98.7 F (37.1 C) (01/06 1927) Temp Source: Axillary (01/06 1927) Pulse Rate: 100 (01/06 2115)  Labs: Recent Labs    05/08/24 0458 05/08/24 1002 05/09/24 0308 05/09/24 1105 05/09/24 1623 05/09/24 1726 05/10/24 0048 05/10/24 0306 05/10/24 0604 05/10/24 1009 05/10/24 1600 05/10/24 1651 05/10/24 2030  HGB 8.4*   < > 8.5*   < > 8.1*   < >  --  7.6*  --  7.6*  --  7.8*  --   HCT 25.4*   < > 26.0*   < > 25.0*   < >  --  22.7*  --  23.1*  --  24.1*  --   PLT 121*  --  118*  --  103*  --   --  107*  --   --   --   --   --   APTT 55*  --  51*  --   --   --  141*  --  >200*  --   --   --   --   LABPROT 25.8*  --  22.4*  --   --   --   --  23.9*  --   --   --   --   --   INR 2.2*  --  1.9*  --   --   --   --  2.0*  --   --   --   --   --   HEPARINUNFRC  --   --   --   --   --   --  <0.10*  --   --  0.13*  --   --  0.29*  CREATININE 1.52*   < > 1.47*   < > 2.09*  --   --  1.88*  --   --  1.74*  --   --    < > = values in this interval not displayed.    Estimated Creatinine Clearance: 48.1 mL/min (A) (by C-G formula based on SCr of 1.74 mg/dL (H)).   Assessment: 18 YOM presented with lower GIB, AoC renal failure, liver cirrhosis and RV failure.  Patient was on Eliquis  for history of Afib and reversed with KCentra  and Vitamin K  on 1/2. GIB ruled out, Afib returned, new LV thrombus and new L lower leg DVT, so Pharmacy consulted to manage IV heparin .   Heparin  level slightly subtherapeutic (0.29) on infusion at 1350 units/hr. No issues with line or bleeding reported per RN.  Goal of Therapy:  Heparin  level 0.3-0.5 units/ml Monitor platelets by anticoagulation protocol: Yes   Plan:  Increase IV heparin  to 1400 units/hr Check 8 hr heparin   level  Vito Ralph, PharmD, BCPS Please see amion for complete clinical pharmacist phone list 05/10/2024 9:32 PM         [1] No Known Allergies

## 2024-05-11 DIAGNOSIS — Z515 Encounter for palliative care: Secondary | ICD-10-CM | POA: Diagnosis not present

## 2024-05-11 DIAGNOSIS — Z7189 Other specified counseling: Secondary | ICD-10-CM | POA: Diagnosis not present

## 2024-05-11 LAB — CULTURE, BLOOD (ROUTINE X 2): Culture: NO GROWTH

## 2024-05-11 LAB — CBC
HCT: 23.4 % — ABNORMAL LOW (ref 39.0–52.0)
Hemoglobin: 7.6 g/dL — ABNORMAL LOW (ref 13.0–17.0)
MCH: 24.6 pg — ABNORMAL LOW (ref 26.0–34.0)
MCHC: 32.5 g/dL (ref 30.0–36.0)
MCV: 75.7 fL — ABNORMAL LOW (ref 80.0–100.0)
Platelets: 106 K/uL — ABNORMAL LOW (ref 150–400)
RBC: 3.09 MIL/uL — ABNORMAL LOW (ref 4.22–5.81)
RDW: 25.6 % — ABNORMAL HIGH (ref 11.5–15.5)
WBC: 20.9 K/uL — ABNORMAL HIGH (ref 4.0–10.5)
nRBC: 10.6 % — ABNORMAL HIGH (ref 0.0–0.2)

## 2024-05-11 LAB — COMPREHENSIVE METABOLIC PANEL WITH GFR
ALT: 340 U/L — ABNORMAL HIGH (ref 0–44)
AST: 263 U/L — ABNORMAL HIGH (ref 15–41)
Albumin: 2.8 g/dL — ABNORMAL LOW (ref 3.5–5.0)
Alkaline Phosphatase: 171 U/L — ABNORMAL HIGH (ref 38–126)
Anion gap: 9 (ref 5–15)
BUN: 10 mg/dL (ref 6–20)
CO2: 22 mmol/L (ref 22–32)
Calcium: 8 mg/dL — ABNORMAL LOW (ref 8.9–10.3)
Chloride: 104 mmol/L (ref 98–111)
Creatinine, Ser: 1.67 mg/dL — ABNORMAL HIGH (ref 0.61–1.24)
GFR, Estimated: 47 mL/min — ABNORMAL LOW
Glucose, Bld: 114 mg/dL — ABNORMAL HIGH (ref 70–99)
Potassium: 4.3 mmol/L (ref 3.5–5.1)
Sodium: 135 mmol/L (ref 135–145)
Total Bilirubin: 12.2 mg/dL — ABNORMAL HIGH (ref 0.0–1.2)
Total Protein: 5.6 g/dL — ABNORMAL LOW (ref 6.5–8.1)

## 2024-05-11 LAB — COOXEMETRY PANEL
Carboxyhemoglobin: 1.6 % — ABNORMAL HIGH (ref 0.5–1.5)
Methemoglobin: 1.1 % (ref 0.0–1.5)
O2 Saturation: 65.2 %
Total hemoglobin: 7.8 g/dL — ABNORMAL LOW (ref 12.0–16.0)

## 2024-05-11 LAB — MAGNESIUM: Magnesium: 2.2 mg/dL (ref 1.7–2.4)

## 2024-05-11 LAB — GLUCOSE, CAPILLARY
Glucose-Capillary: 113 mg/dL — ABNORMAL HIGH (ref 70–99)
Glucose-Capillary: 114 mg/dL — ABNORMAL HIGH (ref 70–99)
Glucose-Capillary: 106 mg/dL — ABNORMAL HIGH (ref 70–99)

## 2024-05-11 LAB — PHOSPHORUS: Phosphorus: 2 mg/dL — ABNORMAL LOW (ref 2.5–4.6)

## 2024-05-11 LAB — PROTIME-INR
INR: 2 — ABNORMAL HIGH (ref 0.8–1.2)
Prothrombin Time: 24.1 s — ABNORMAL HIGH (ref 11.4–15.2)

## 2024-05-11 LAB — HEMOGLOBIN AND HEMATOCRIT, BLOOD
HCT: 23.2 % — ABNORMAL LOW (ref 39.0–52.0)
Hemoglobin: 7.5 g/dL — ABNORMAL LOW (ref 13.0–17.0)

## 2024-05-11 LAB — HEPARIN LEVEL (UNFRACTIONATED): Heparin Unfractionated: 0.3 [IU]/mL (ref 0.30–0.70)

## 2024-05-11 MED ORDER — GLYCOPYRROLATE 0.2 MG/ML IJ SOLN
0.4000 mg | INTRAMUSCULAR | Status: DC | PRN
  Filled 2024-05-11: qty 2

## 2024-05-11 MED ORDER — ACETAMINOPHEN 650 MG RE SUPP: 650.0000 mg | Freq: Four times a day (QID) | RECTAL | Status: DC | PRN

## 2024-05-11 MED ORDER — MIDAZOLAM HCL (PF) 2 MG/2ML IJ SOLN
1.0000 mg | INTRAMUSCULAR | Status: DC | PRN
  Administered 2024-05-11: 2 mg via INTRAVENOUS
  Filled 2024-05-11: qty 4

## 2024-05-11 MED ORDER — SODIUM PHOSPHATES 45 MMOLE/15ML IV SOLN
30.0000 mmol | Freq: Once | INTRAVENOUS | Status: AC
  Administered 2024-05-11: 30 mmol via INTRAVENOUS
  Filled 2024-05-11: qty 10

## 2024-05-11 MED ORDER — BIOTENE DRY MOUTH MT LIQD: 15.0000 mL | OROMUCOSAL | Status: DC | PRN

## 2024-05-11 MED ORDER — SODIUM CHLORIDE 0.9 % IV SOLN: INTRAVENOUS | Status: DC

## 2024-05-11 MED ORDER — ACETAMINOPHEN 325 MG PO TABS: 650.0000 mg | ORAL_TABLET | Freq: Four times a day (QID) | ORAL | Status: DC | PRN

## 2024-05-11 MED ORDER — POLYVINYL ALCOHOL 1.4 % OP SOLN: 1.0000 [drp] | Freq: Four times a day (QID) | OPHTHALMIC | Status: DC | PRN

## 2024-05-16 LAB — DRUG PROFILE, UR, 9 DRUGS (LABCORP)
Amphetamines, Urine: NEGATIVE ng/mL
Barbiturate, Ur: NEGATIVE ng/mL
Benzodiazepine Quant, Ur: NEGATIVE ng/mL
Creatinine, Urine: 128.3 mg/dL (ref 20.0–300.0)
Methadone Screen, Urine: NEGATIVE ng/mL
Nitrite Urine, Quantitative: NEGATIVE ug/mL
OPIATE SCREEN URINE: NEGATIVE ng/mL
Phencyclidine, Ur: NEGATIVE ng/mL
Propoxyphene, Urine: NEGATIVE ng/mL
pH, Urine: 5.5 (ref 4.5–8.9)

## 2024-05-16 LAB — COCAINE CONF, UR
Benzoylecgonine GC/MS Conf: 7040 ng/mL
Cocaine Metab Quant, Ur: POSITIVE — AB

## 2024-05-16 LAB — CANNABINOID CONFIRMATION, UR
CANNABINOIDS: POSITIVE — AB
Carboxy THC GC/MS Conf: 79 ng/mL

## 2024-06-05 NOTE — TOC Progression Note (Signed)
 Transition of Care [facility]) - Progression Note    Patient Details  Name: Cody Guzman MRN: [id number] Date of Birth: 1965-09-30  Transition of Care [facility]) CM/SW Contact  Arlana JINNY Nicholaus [place] Phone Number: [phone] 05/29/24, 2:50 PM  Clinical Narrative:   HF CSW reviewed patients chart for discharge readiness, patient not medically stable for d/c. ICM will continue to follow.   HF CSW/CM will continue to follow and monitor for dc readiness.                         Expected Discharge Plan and Services                                               Social Drivers of Health (SDOH) Interventions SDOH Screenings   Food Insecurity: No Food Insecurity (01/12/2024)  Housing: Low Risk (01/12/2024)  Transportation Needs: Unmet Transportation Needs (01/12/2024)  Utilities: Not At Risk (01/12/2024)  Alcohol  Screen: Low Risk (01/13/2023)  Depression (PHQ2-9): High Risk (08/17/2023)  Financial Resource Strain: Low Risk (01/13/2023)  Physical Activity: Insufficiently Active (01/13/2023)  Social Connections: Moderately Integrated (01/13/2023)  Stress: No Stress Concern Present (01/13/2023)  Tobacco Use: High Risk (05/05/2024)  Health Literacy: Adequate Health Literacy (01/13/2023)    Readmission Risk Interventions     No data to display

## 2024-06-05 NOTE — Progress Notes (Signed)
 "  NAMEJakobi Guzman, MRN:  [id number], DOB:  1966-03-28, LOS: 5 ADMISSION DATE:  05/05/2024, CONSULTATION DATE:  05/06/24 REFERRING MD:  Nancyann Kin, MD, CHIEF COMPLAINT:  rectal bleed  History of Present Illness:  History obtained via chart review patient is encephalopathic   59  year old male history of chronic HFrEF s/p ICD, HTN, BPAD, history of GI bleed, history of esophageal varices, and CKD stage III who presented to the ED with progressive altered mental status and 3 days of blood in stool. Initial evaluation was concerning for wide complex tachyarrhythmia and he was given calcium . He was also found to be hypothermic, hypoglycemic and hypotensive with acute renal failure. He was started on levophed  and a trialysis line was placed.   Pertinent  Medical History  CAD, Cardiomyopathy s/p defibrillator, CKD III, HTN, neuropathy, Atrial fib/flutter, Anemia, CHF, RBBB, cirrhosis, esophageal varices, bipolar disorder, non-hodgkins lymphoma in remisson   Significant Hospital Events: Including procedures, antibiotic start and stop dates in addition to other pertinent events   1/2: admit to ICU on NE/vaso, CRRT started 1/5: AHF involved yesterday, RHC, GOC-changed to DNR/ DNI limited May 23, 2024 to transition to comfort care with discontinuation of vasopressor support later this afternoon  Interim History / Subjective:  Comfortable on Precedex  drip  Objective    Blood pressure (!) 89/64, pulse (!) 118, temperature 98.5 F (36.9 C), temperature source Axillary, resp. rate (!) 23, weight 85.9 kg, SpO2 (!) 77%. CVP:  [11 mmHg-25 mmHg] 18 mmHg      Intake/Output Summary (Last 24 hours) at 05-23-2024 0828 Last data filed at 05/23/24 0800 Gross per 24 hour  Intake 4128.37 ml  Output 4727.5 ml  Net -599.13 ml   Filed Weights   05/08/24 0459 05/09/24 0158 05/10/24 0243  Weight: 84.6 kg 83.3 kg 85.9 kg    Examination: General general chronic severely deconditioned elderly male lying in bed  on CRRT in no acute distress HEENT: [place]/AT, MM pink/moist, PERRL,  Neuro: Sedated on Precedex  drip CV: s1s2 regular rate and rhythm, no murmur, rubs, or gallops,  PULM: Diminished bilaterally, no increased work of breathing GI: soft, bowel sounds active in all 4 quadrants, non-tender, non-distended Extremities: warm/dry, no edema  Skin: no rashes or lesions  Resolved problem list  Wide complex tachyaryhtmia   Assessment and Plan  Cardiogenic shock, possible/ less likely distributive component  Lactic acidosis  Acute on chronic systolic HF with BiV failure  Afib/flutter  LV thrombus  Acute hypoxic respiratory failure 2/2 pulmonary edema  Shock liver, congestive hepatopathy  Cirrhosis  GIB 2/2 varices v LGI AKI on CKD  Toxic metabolic encephalopathy  At risk for protein calorie malnutrition   Plan Palliative care met with family 1/6 decision made to transition to comfort care.  Overnight family was not able to present to bedside so decision was made to transition today 05-23-2024 we are awaiting family's arrival.  Currently patient remains comfortable on Precedex  drip prior to transition will initiate low-dose morphine  drip to ensure comfort.  Critical care:  CRITICAL CARE Performed by: Clora Ohmer D. Harris   Total critical care time: 35 minutes  Critical care time was exclusive of separately billable procedures and treating other patients.  Critical care was necessary to treat or prevent imminent or life-threatening deterioration.  Critical care was time spent personally by me on the following activities: development of treatment plan with patient and/or surrogate as well as nursing, discussions with consultants, evaluation of patient's response to treatment, examination of patient, obtaining  history from patient or surrogate, ordering and performing treatments and interventions, ordering and review of laboratory studies, ordering and review of radiographic studies, pulse oximetry and  re-evaluation of patient's condition.  Kimberly Coye D. Harris, NP-C [facility] Personal contact information can be found on Amion  If no contact or response made please call [phone] Jun 06, 2024, 8:30 AM           "

## 2024-06-05 NOTE — Progress Notes (Signed)
 "  Palliative Medicine Inpatient Follow Up Note   HPI: 59 year old male history of chronic HFrEF s/p ICD, HTN, BPAD, history of GI bleed, history of esophageal varices, and CKD stage III who presented to the ED with progressive altered mental status and 3 days of blood in stool.  Has had a complicated hospitalization in the setting of cardiogenic shock resulting in multisystem organ failure. Palliative care has been asked to support additional goals of care conversations in the setting of significant disease burden.   Had been seen by the PMT 9/12-9/15 2025. At that time. Patient vocalized the desire for all life prolonging measures.   Today's Discussion May 31, 2024  *Please note that this is a verbal dictation therefore any spelling or grammatical errors are due to the Dragon Medical One system interpretation.  I reviewed the chart notes including nursing notes from today, progress notes from today. I also reviewed vital signs, nursing flowsheets, medication administrations record, labs, and imaging.  I met with Cody Guzman this morning. He is aware of who is and where he is however not why he is here. He appears to be hallucinating during my time with him at bedside.   I spoke with patients RN, Cody Guzman who shares the plan for patients niece to come this afternoon.  _____________________________________________________  Cody Guzman - CCM APP and myself met with patients niece, Cody Guzman and spoke to patients daughter, Cody Guzman at bedside this early evening. The severity of patients multi-system organ failure was explained by Cody Guzman. She shares the measures we are pursuing at this time with eventually cause pain to the patient and ultimately we are not able to recover patients organ function.   The rally was described as something patient may be experiencing at this time. Patients family share understanding.  We confirmed the plan to transition Cody Guzman to comfort measures this evening. We reviewed the  plan to stop aggressive interventions such as pressor support and CRRT.   Spoke to [employer] Cody Guzman[employer] representative about stopping AICD.   Comfort orders reviewed and placed appropriately.   Questions and concerns addressed/Palliative Support Provided.   Objective Assessment: Vital Signs Vitals:   05-31-24 1625 05-31-24 1630  BP:    Pulse: (!) 210 (!) 139  Resp: (!) 22 17  Temp:    SpO2: (!) 68% (!) 79%    Intake/Output Summary (Last 24 hours) at May 31, 2024 1731 Last data filed at 05-31-2024 1600 Gross per 24 hour  Intake 4108.95 ml  Output 4822 ml  Net -713.05 ml   Last Weight  Most recent update: 05/10/2024  2:43 AM    Weight  85.9 kg (189 lb 6 oz)            Gen: Older African-American male critically ill in appearance HEENT: Core track in place, dry mucous membranes CV: Irregular rate and rhythm  PULM: On 2 L nasal cannula breathing is even nonlabored ABD: soft/nontender  EXT: Generalized edema  Neuro: Disoriented  SUMMARY OF RECOMMENDATIONS   DNAR/DNI  Transition to comfort measures  AICD to be deactivated by [employer] representative, Cody Guzman  Fentanyl  gtt with boluses started  Additional comfort medications per [facility]  Anticipate in hospital death  The palliative care team will continue to follow along with Cody Guzman during hospitalization ______________________________________________________________________________________ Cody Guzman [place] Palliative Medicine Team Team Cell Phone: [phone] Please utilize secure chat with additional questions, if there is no response within 30 minutes please call the above phone number  Time Spent: 50  Palliative Medicine Team providers  are available by phone from 7am to 7pm daily and can be reached through the team cell phone.  Should this patient require assistance outside of these hours, please call the patient's attending physician.     "

## 2024-06-05 NOTE — Progress Notes (Signed)
 " [facility] Progress Note   Subjective:   For transition to comfort care today.  Objective Vitals:   2024-05-28 1015 2024/05/28 1030 May 28, 2024 1045 05/28/24 1100  BP:      Pulse: (!) 115 (!) 111 (!) 121 (!) 111  Resp: 16 (!) 23 (!) 26 19  Temp:      TempSrc:      SpO2: (!) 79% (!) 74% (!) 80% (!) 71%  Weight:       Physical Exam General: sleepy calm on precedex  Heart:RRR Lungs: coarse Abdomen: soft, mildly distended Extremities: 1-2+ diffuse  GU: foley draining amber urine Dialysis Access:  temp femoral HD catheter c/d/i  Additional Objective Labs: Basic Metabolic Panel: Recent Labs  Lab 05/10/24 0306 05/10/24 1600 05-28-24 0511  NA 136 134* 135  K 4.1 4.4 4.3  CL 103 102 104  CO2 20* 22 22  GLUCOSE 133* 118* 114*  BUN 14 12 10   CREATININE 1.88* 1.74* 1.67*  CALCIUM  7.8* 7.8* 8.0*  PHOS 3.2 2.4* 2.0*   Liver Function Tests: Recent Labs  Lab 05/09/24 0308 05/10/24 0306 05/10/24 1600 May 28, 2024 0511  AST 647* 361*  --  263*  ALT 552* 412*  --  340*  ALKPHOS 185* 208*  --  171*  BILITOT 12.6* 12.1*  --  12.2*  PROT 5.9* 5.7*  --  5.6*  ALBUMIN 3.0* 2.8* 2.8* 2.8*   No results for input(s): LIPASE, AMYLASE in the last 168 hours. CBC: Recent Labs  Lab 05/06/24 0250 05/06/24 1101 05/08/24 0458 05/08/24 1121 05/09/24 0308 05/09/24 1105 05/09/24 1623 05/09/24 1726 05/10/24 0306 05/10/24 1009 05/10/24 2257 05-28-2024 0511 2024-05-28 1010  WBC 15.8*   < > 16.0*  --  18.4*  --  18.9*  --  20.2*  --   --  20.9*  --   NEUTROABS 13.7*  --   --   --   --   --   --   --   --   --   --   --   --   HGB 7.2*   < > 8.4*   < > 8.5*   < > 8.1*   < > 7.6*   < > 7.7* 7.6* 7.5*  HCT 23.6*   < > 25.4*   < > 26.0*   < > 25.0*   < > 22.7*   < > 23.6* 23.4* 23.2*  MCV 79.2*   < > 74.3*  --  74.7*  --  75.8*  --  74.4*  --   --  75.7*  --   PLT 217   < > 121*  --  118*  --  103*  --  107*  --   --  106*  --    < > = values in this interval not displayed.    Blood Culture    Component Value Date/Time   SDES URINE, RANDOM 05/06/2024 1644   SPECREQUEST NONE Reflexed from [id number] 05/06/2024 1644   CULT  05/06/2024 1644    NO GROWTH Performed at [facility], [address], [place], [place] [address]    REPTSTATUS 05/07/2024 FINAL 05/06/2024 1644    Cardiac Enzymes: No results for input(s): CKTOTAL, CKMB, CKMBINDEX, TROPONINI in the last 168 hours. CBG: Recent Labs  Lab 05/10/24 1519 05/10/24 1925 05/10/24 2309 05-28-24 0316 2024/05/28 0735  GLUCAP 81 107* 148* 113* 114*   Iron  Studies: No results for input(s): IRON , TIBC, TRANSFERRIN, FERRITIN in the last 72 hours. @  lablastinr3@ Studies/Results: CARDIAC CATHETERIZATION Result Date: 05/10/2024 HEMODYNAMICS: RA:       18 mmHg (mean) RV:       32/2, 18 mmHg PA:       32/16 mmHg (21 mean) PCWP: 14 mmHg (mean)    Estimated Fick CO/CI   5.03L/min, 2.56L/min/m2 Thermodilution CO/CI   3.85L/min, 1.96L/min/m2    TPG  7  mmHg     PVR  <2 Wood Units PAPi  0.89  IMPRESSION: RV predominant shock with ventricularization of the RA tracing due to severe TR Normal left sided filling pressures Moderately reduced cardiac index by TD on 22 of NE, vasopressin  0.04 RECOMMENDATIONS: Not an MCS candidate, would benefit from continued fluid removal Start dobutamine  5mcg/min for RV support Can consider Epinephrine  as an additional pressor if needed, continue vasopressin  given cardiac cirrhosis Extremely poor prognosis. Palliative care discussions would be beneficial   DG Abd Portable 1V Result Date: 05/09/2024 EXAM: 1 VIEW XRAY OF THE ABDOMEN 05/09/2024 04:03:00 PM COMPARISON: None available. CLINICAL HISTORY: Encounter for feeding tube placement [phone] FINDINGS: LINES, TUBES AND DEVICES: Weighted enteric tube courses below diaphragm and is looped in the left upper quadrant with tip in gastric fundus. Partially imaged cardiac pacer defibrillator noted. BOWEL: Nonobstructive bowel gas pattern.  SOFT TISSUES: No abnormal calcifications. BONES: No acute fracture. CHEST FINDINGS: Cardiomegaly. IMPRESSION: 1. Weighted enteric tube in appropriate position with tip in the gastric fundus. 2. Cardiomegaly. Electronically signed by: Greig Pique MD 05/09/2024 04:42 PM EST RP Workstation: [id number]   VAS US  UPPER EXTREMITY VENOUS DUPLEX Result Date: 05/09/2024 UPPER VENOUS STUDY  Patient Name:  SAVYON LOKEN  Date of Exam:   05/09/2024 Medical Rec #: [id number]            Accession #:    [id number] Date of Birth: 04/05/1966             Patient Gender: M Patient Age:   59 years Exam Location:  [facility] Procedure:      VAS US  UPPER EXTREMITY VENOUS DUPLEX Referring Phys: TINNIE FURTH --------------------------------------------------------------------------------  Indications: Edema Limitations: Poor ultrasound/tissue interface, bandages and line. Comparison Study: No previous exams Performing Technologist: Jody Hill RVT, RDMS  Examination Guidelines: A complete evaluation includes B-mode imaging, spectral Doppler, color Doppler, and power Doppler as needed of all accessible portions of each vessel. Bilateral testing is considered an integral part of a complete examination. Limited examinations for reoccurring indications may be performed as noted.  Right Findings: +-----+------------+---------+-----------+----------+--------------+ RIGHTCompressiblePhasicitySpontaneousProperties   Summary     +-----+------------+---------+-----------+----------+--------------+ IJV                                            Not visualized +-----+------------+---------+-----------+----------+--------------+ Unable to visualize right subclavian due to line placement and position of arm.  Left Findings: +----------+------------+---------+-----------+----------+--------------------+ LEFT      CompressiblePhasicitySpontaneousProperties      Summary         +----------+------------+---------+-----------+----------+--------------------+ IJV           Full       No        Yes                                   +----------+------------+---------+-----------+----------+--------------------+ Subclavian  Partial      No        Yes                                   +----------+------------+---------+-----------+----------+--------------------+  Axillary      Full       No        Yes                                   +----------+------------+---------+-----------+----------+--------------------+ Brachial                 No        Yes                   patent by                                                              color/doppler     +----------+------------+---------+-----------+----------+--------------------+ Radial        Full                                                       +----------+------------+---------+-----------+----------+--------------------+ Ulnar                                                  Not visualized    +----------+------------+---------+-----------+----------+--------------------+ Cephalic      Full                                  not well visualized  +----------+------------+---------+-----------+----------+--------------------+ Basilic                                                Not visualized    +----------+------------+---------+-----------+----------+--------------------+ Pulsatile waveforms throughout.  Summary:  Left: Findings consistent with acute deep vein thrombosis involving the left subclavian vein. Very limited exam due to diffuse subcutaneous edema and line and bandage of mid and distal upper arm.  *See table(s) above for measurements and observations.  Diagnosing physician: Lonni Gaskins MD Electronically signed by Lonni Gaskins MD on 05/09/2024 at 4:36:06 PM.    Final    DG CHEST PORT 1 VIEW Result Date: 05/09/2024 EXAM: 1 VIEW(S) XRAY OF THE CHEST  05/09/2024 02:21:00 PM COMPARISON: 05/06/2024 CLINICAL HISTORY: Encounter for central line placement [id number] FINDINGS: LINES, TUBES AND DEVICES: Right internal jugular central venous catheter in place with tip extending into the mid to lower SVC. While the tip may be within the right atrium, it is not well visualized due to overlying cardiac leads. LUNGS AND PLEURA: No focal pulmonary opacity. No pleural effusion. No pneumothorax. HEART AND MEDIASTINUM: Left chest pacemaker/AICD with leads terminating in the right atrium and right ventricle. BONES AND SOFT TISSUES: Multilevel thoracic osteophytosis. IMPRESSION: 1. Right internal jugular central venous catheter in place with tip extending into the mid to lower SVC, the tip is possibly within the right atrium, but not well visualized due to overlying cardiac wires. No  pneumothorax. A repeat radiograph of the chest with removal of the overlying cardiac leads recommended. These results will be called to the ordering clinician or representative by the radiologist assistant and communication documented in the pacs or clario dashboard. Electronically signed by: Rogelia Myers MD 05/09/2024 03:19 PM EST RP Workstation: [id number]   Medications:  amiodarone  30 mg/hr (06-03-2024 1100)   ceFEPime  (MAXIPIME ) IV Stopped (03-Jun-2024 1016)   dexmedetomidine  (PRECEDEX ) IV infusion 1.1 mcg/kg/hr (06/03/24 1100)   DOBUTamine  2.5 mcg/kg/min (Jun 03, 2024 1100)   epinephrine  17 mcg/min (06/03/24 1100)   fentaNYL  infusion INTRAVENOUS     heparin  1,400 Units/hr (06/03/2024 1100)   metronidazole  Stopped (06-03-2024 0943)   norepinephrine  (LEVOPHED ) Adult infusion 40 mcg/min (06/03/2024 1100)   prismasol  BGK 4/2.5 400 mL/hr at 05/10/24 1733   prismasol  BGK 4/2.5 1,500 mL/hr at 06/03/24 1102   prismasol  BGK 4/2.5 400 mL/hr at 05/10/24 1733   vasopressin  0.04 Units/min (Jun 03, 2024 1100)    Chlorhexidine  Gluconate Cloth  6 each Topical Daily   insulin  aspart  0-6 Units Subcutaneous Q4H    pantoprazole  (PROTONIX ) IV  40 mg Intravenous QHS   polyethylene glycol  17 g Per Tube BID   thiamine   100 mg Per Tube Daily   topiramate   25 mg Oral Daily    Assessment 31M severe AKI with hyperkalemia and EKG changes; undifferentiated shock (cardiogenic, hemorrhagic, septic?),  HFrEF with history of ICD, history of esophageal varices presenting with GI bleed, hypothermia, persistent hypoglycemia, LFT elevation   AKI on CKD3 secondary to # acute GI bleed, cardiogenic/ septic shock (? Hemorrhagic)  - continue CRRT with all 4k bath  - stop hep in CRRT since getting systemic gtt  - stop CRRT when transitions to comfort care Severe hyperkalemia with EKG changes resolved with CRRT Multifactorial shock on norepinephrine  - TTE with EF < 20%   - RHC twith severe RV failrue and low PAPI ABLA/GI bleed, history of esophageal varices tx w octreotide  and norepinephrine , required transfusions but no e/o ongoing active bleeding, GI following Severe metabolic acidosis with elevated lactate improving ALF - improving, query shock liver, NAC and supportive care; Coagulopathy with elevated INR s/p vit K Dispo:for comfort care when family arrives    Almarie Bonine MD June 03, 2024, 11:23 AM  [facility]   "

## 2024-06-05 NOTE — Progress Notes (Signed)
" ° °  Palliative Medicine Inpatient Follow Up Note   Deactivate AICD therapies.  "

## 2024-06-05 NOTE — Discharge Summary (Signed)
 "  DEATH SUMMARY   Patient Details  Name: Cody Guzman MRN: [id number] DOB: 09-27-1965 ERE:Qozfpwh, Haze ORN, NP  Admission/Discharge Information   Admit Date:  May 24, 2024  Date of Death: Date of Death: 30-May-2024  Time of Death: Time of Death: Jun 11, 2200  Length of Stay: 5   Principle Cause of death: Cardiogenic shock  Hospital Diagnoses: Principal Problem:   Acute renal failure Active Problems:   Hyperkalemia   Hypothermia   Shock (HCC)   Rectal bleeding   Hospital Course: 59 year old male with a past medical history of biventricular heart failure with reduced ejection fraction, LV thrombus, history of GI bleed, history of varices who presented to the hospital with bright blood blood per rectum for 3 days prior to admission as well as shock suspected to be cardiogenic in nature.  Despite resuscitative efforts he continues to have deterioration with increasing pressor requirements.  Underwent right heart cath which showed severe RV failure as well as evidence of cardiogenic shock.  He was not a candidate for advanced mechanical support given his liver cirrhosis and poor compliance.  Goals of care discussed with the family and they elected to transition to comfort measures only.  Patient passed away on 30-May-2024 at 2200-06-11.            Procedures: Right heart catheterization  Consultations: Cardiology, palliative care  The results of significant diagnostics from this hospitalization (including imaging, microbiology, ancillary and laboratory) are listed below for reference.   Significant Diagnostic Studies: CARDIAC CATHETERIZATION Result Date: 05/10/2024 HEMODYNAMICS: RA:       18 mmHg (mean) RV:       32/2, 18 mmHg PA:       32/16 mmHg (21 mean) PCWP: 14 mmHg (mean)    Estimated Fick CO/CI   5.03L/min, 2.56L/min/m2 Thermodilution CO/CI   3.85L/min, 1.96L/min/m2    TPG  7  mmHg     PVR  <2 Wood Units PAPi  0.89  IMPRESSION: RV predominant shock with ventricularization of the RA  tracing due to severe TR Normal left sided filling pressures Moderately reduced cardiac index by TD on 22 of NE, vasopressin  0.04 RECOMMENDATIONS: Not an MCS candidate, would benefit from continued fluid removal Start dobutamine  5mcg/min for RV support Can consider Epinephrine  as an additional pressor if needed, continue vasopressin  given cardiac cirrhosis Extremely poor prognosis. Palliative care discussions would be beneficial   DG Abd Portable 1V Result Date: 05/09/2024 EXAM: 1 VIEW XRAY OF THE ABDOMEN 05/09/2024 04:03:00 PM COMPARISON: None available. CLINICAL HISTORY: Encounter for feeding tube placement [phone] FINDINGS: LINES, TUBES AND DEVICES: Weighted enteric tube courses below diaphragm and is looped in the left upper quadrant with tip in gastric fundus. Partially imaged cardiac pacer defibrillator noted. BOWEL: Nonobstructive bowel gas pattern. SOFT TISSUES: No abnormal calcifications. BONES: No acute fracture. CHEST FINDINGS: Cardiomegaly. IMPRESSION: 1. Weighted enteric tube in appropriate position with tip in the gastric fundus. 2. Cardiomegaly. Electronically signed by: Greig Pique MD 05/09/2024 04:42 PM EST RP Workstation: [id number]   VAS US  UPPER EXTREMITY VENOUS DUPLEX Result Date: 05/09/2024 UPPER VENOUS STUDY  Patient Name:  Cody Guzman  Date of Exam:   05/09/2024 Medical Rec #: [id number]            Accession #:    [id number] Date of Birth: [birth year]             Patient Gender: M Patient Age:   65 years Exam Location:  [facility] Procedure:      VAS US  UPPER  EXTREMITY VENOUS DUPLEX Referring Phys: TINNIE AUTRY --------------------------------------------------------------------------------  Indications: Edema Limitations: Poor ultrasound/tissue interface, bandages and line. Comparison Study: No previous exams Performing Technologist: Jody Hill RVT, RDMS  Examination Guidelines: A complete evaluation includes B-mode imaging, spectral Doppler, color Doppler, and power Doppler as  needed of all accessible portions of each vessel. Bilateral testing is considered an integral part of a complete examination. Limited examinations for reoccurring indications may be performed as noted.  Right Findings: +-----+------------+---------+-----------+----------+--------------+ RIGHTCompressiblePhasicitySpontaneousProperties   Summary     +-----+------------+---------+-----------+----------+--------------+ IJV                                            Not visualized +-----+------------+---------+-----------+----------+--------------+ Unable to visualize right subclavian due to line placement and position of arm.  Left Findings: +----------+------------+---------+-----------+----------+--------------------+ LEFT      CompressiblePhasicitySpontaneousProperties      Summary        +----------+------------+---------+-----------+----------+--------------------+ IJV           Full       No        Yes                                   +----------+------------+---------+-----------+----------+--------------------+ Subclavian  Partial      No        Yes                                   +----------+------------+---------+-----------+----------+--------------------+ Axillary      Full       No        Yes                                   +----------+------------+---------+-----------+----------+--------------------+ Brachial                 No        Yes                   patent by                                                              color/doppler     +----------+------------+---------+-----------+----------+--------------------+ Radial        Full                                                       +----------+------------+---------+-----------+----------+--------------------+ Ulnar                                                  Not visualized    +----------+------------+---------+-----------+----------+--------------------+ Cephalic       Full  not well visualized  +----------+------------+---------+-----------+----------+--------------------+ Basilic                                                Not visualized    +----------+------------+---------+-----------+----------+--------------------+ Pulsatile waveforms throughout.  Summary:  Left: Findings consistent with acute deep vein thrombosis involving the left subclavian vein. Very limited exam due to diffuse subcutaneous edema and line and bandage of mid and distal upper arm.  *See table(s) above for measurements and observations.  Diagnosing physician: Lonni Gaskins MD Electronically signed by Lonni Gaskins MD on 05/09/2024 at 4:36:06 PM.    Final    DG CHEST PORT 1 VIEW Result Date: 05/09/2024 EXAM: 1 VIEW(S) XRAY OF THE CHEST 05/09/2024 02:21:00 PM COMPARISON: 05/06/2024 CLINICAL HISTORY: Encounter for central line placement [id number] FINDINGS: LINES, TUBES AND DEVICES: Right internal jugular central venous catheter in place with tip extending into the mid to lower SVC. While the tip may be within the right atrium, it is not well visualized due to overlying cardiac leads. LUNGS AND PLEURA: No focal pulmonary opacity. No pleural effusion. No pneumothorax. HEART AND MEDIASTINUM: Left chest pacemaker/AICD with leads terminating in the right atrium and right ventricle. BONES AND SOFT TISSUES: Multilevel thoracic osteophytosis. IMPRESSION: 1. Right internal jugular central venous catheter in place with tip extending into the mid to lower SVC, the tip is possibly within the right atrium, but not well visualized due to overlying cardiac wires. No pneumothorax. A repeat radiograph of the chest with removal of the overlying cardiac leads recommended. These results will be called to the ordering clinician or representative by the radiologist assistant and communication documented in the pacs or clario dashboard. Electronically signed by: Rogelia Myers MD 05/09/2024 03:19 PM EST RP Workstation: [id number]   US  LT UPPER EXTREM LTD SOFT TISSUE NON VASCULAR Result Date: 05/09/2024 CLINICAL DATA:  Extravasation injury. EXAM: ULTRASOUND LEFT UPPER EXTREMITY LIMITED TECHNIQUE: Ultrasound examination of the upper extremity soft tissues was performed in the area of clinical concern. COMPARISON:  None Available. FINDINGS: Focused ultrasound exam was performed in the region of the left in the cubital fossa. This shows subcutaneous edema/fluid without a discrete or organized fluid collection. IMPRESSION: Subcutaneous edema/fluid in the region of the left in the cubital fossa. No discrete or organized fluid collection. Electronically Signed   By: Camellia Candle M.D.   On: 05/09/2024 06:16   US  ABDOMEN LIMITED WITH LIVER DOPPLER Result Date: 05/06/2024 CLINICAL DATA:  Transaminitis.  History of chronic heart failure. EXAM: DUPLEX ULTRASOUND OF LIVER TECHNIQUE: Color and duplex Doppler ultrasound was performed to evaluate the hepatic in-flow and out-flow vessels. COMPARISON:  Abdomen pelvis 01/12/2024 FINDINGS: Liver: Gallbladder is decompressed with mild wall thickening measuring up to 0.6 cm. These findings are nonspecific. Common bile duct measures 0.3 cm. Liver contour is slightly nodular. No discrete liver lesion. Main Portal Vein size: 0.8 cm Portal Vein Velocities Main Prox:  44 cm/sec Main Mid: 47 cm/sec Main Dist:  55 cm/sec Right: 79 cm/sec Left: 46 cm/sec Hepatic Vein Velocities Right:  87 cm/sec Middle:  36 cm/sec Left:  33 cm/sec IVC: Present and patent with normal respiratory phasicity. Hepatic Artery Velocity:  146 cm/sec Splenic Vein Velocity:  41 cm/sec Spleen was not visualized. Portal Vein Occlusion/Thrombus: No Splenic Vein Occlusion/Thrombus: No Ascites: None Varices: None Bidirectional flow in the portal veins with hepatopetal and hepatofugal flow. Primarily  hepatofugal flow in the hepatic veins. IMPRESSION: 1. Portal venous system is patent  with bidirectional flow. This finding is likely associated with the patient's chronic heart failure. 2. Hepatic veins are patent. 3. Gallbladder is decompressed with mild wall thickening. These findings are nonspecific. 4. Liver has a slightly nodular contour. Mild cirrhotic changes cannot be excluded. Electronically Signed   By: Juliene Balder M.D.   On: 05/06/2024 18:26   ECHOCARDIOGRAM COMPLETE Result Date: 05/06/2024    ECHOCARDIOGRAM REPORT   Patient Name:   TAYSHAUN KROH Date of Exam: 05/06/2024 Medical Rec #:  [id number]           Height:       68.0 in Accession #:    [id number]          Weight:       194.9 lb Date of Birth:  08/01/1965            BSA:          2.021 m Patient Age:    58 years            BP:           112/95 mmHg Patient Gender: M                   HR:           116 bpm. Exam Location:  Inpatient Procedure: 2D Echo, Cardiac Doppler, Color Doppler and Intracardiac            Opacification Agent (Both Spectral and Color Flow Doppler were            utilized during procedure). Indications:    Shock R57.9  History:        Patient has prior history of Echocardiogram examinations, most                 recent 01/13/2024. CHF.  Sonographer:    Nathanel Devonshire Referring Phys: [id number] TARA N WILSON IMPRESSIONS  1. LV thrombus present in the apex. Left ventricular ejection fraction, by estimation, is <20%. The left ventricle has severely decreased function. The left ventricle demonstrates global hypokinesis. The left ventricular internal cavity size was mildly to moderately dilated. Left ventricular diastolic function could not be evaluated. There is the interventricular septum is flattened in systole, consistent with right ventricular pressure overload.  2. RVSP underestimated due to early equilization of pressures, severe TR. Right ventricular systolic function is severely reduced. The right ventricular size is severely enlarged.  3. Left atrial size was moderately dilated.  4. Right atrial size was  severely dilated.  5. The mitral valve is normal in structure. Mild mitral valve regurgitation. No evidence of mitral stenosis.  6. Severe lead related TR. The tricuspid valve is degenerative. Tricuspid valve regurgitation is severe.  7. The aortic valve is tricuspid. Aortic valve regurgitation is not visualized. Aortic valve sclerosis is present, with no evidence of aortic valve stenosis.  8. The inferior vena cava is dilated in size with <50% respiratory variability, suggesting right atrial pressure of 15 mmHg. FINDINGS  Left Ventricle: LV thrombus present in the apex. Left ventricular ejection fraction, by estimation, is <20%. The left ventricle has severely decreased function. The left ventricle demonstrates global hypokinesis. Definity  contrast agent was given IV to delineate the left ventricular endocardial borders. The left ventricular internal cavity size was mildly to moderately dilated. There is no left ventricular hypertrophy. The interventricular septum is flattened in systole, consistent with right ventricular pressure overload.  Left ventricular diastolic function could not be evaluated due to paced rhythm. Left ventricular diastolic function could not be evaluated. Right Ventricle: RVSP underestimated due to early equilization of pressures, severe TR. The right ventricular size is severely enlarged. No increase in right ventricular wall thickness. Right ventricular systolic function is severely reduced. The tricuspid regurgitant velocity is 1.77 m/s, and with an assumed right atrial pressure of 15 mmHg, the estimated right ventricular systolic pressure is 27.5 mmHg. Left Atrium: Left atrial size was moderately dilated. Right Atrium: Right atrial size was severely dilated. Pericardium: There is no evidence of pericardial effusion. Mitral Valve: The mitral valve is normal in structure. Mild mitral valve regurgitation. No evidence of mitral valve stenosis. Tricuspid Valve: Severe lead related TR. The  tricuspid valve is degenerative in appearance. Tricuspid valve regurgitation is severe. No evidence of tricuspid stenosis. Aortic Valve: The aortic valve is tricuspid. Aortic valve regurgitation is not visualized. Aortic valve sclerosis is present, with no evidence of aortic valve stenosis. Aortic valve mean gradient measures 4.0 mmHg. Aortic valve peak gradient measures 6.9  mmHg. Aortic valve area, by VTI measures 1.60 cm. Pulmonic Valve: The pulmonic valve was normal in structure. Pulmonic valve regurgitation is not visualized. No evidence of pulmonic stenosis. Aorta: The aortic root is normal in size and structure. Venous: The inferior vena cava is dilated in size with less than 50% respiratory variability, suggesting right atrial pressure of 15 mmHg. IAS/Shunts: No atrial level shunt detected by color flow Doppler. Additional Comments: A device lead is visualized.  LEFT VENTRICLE PLAX 2D LVIDd:         6.20 cm LVIDs:         5.70 cm LV PW:         0.90 cm LV IVS:        1.00 cm LVOT diam:     2.00 cm LV SV:         29 LV SV Index:   14 LVOT Area:     3.14 cm  LV Volumes (MOD) LV vol d, MOD A2C: 134.0 ml LV vol d, MOD A4C: 122.0 ml LV vol s, MOD A2C: 109.0 ml LV vol s, MOD A4C: 98.7 ml LV SV MOD A2C:     25.0 ml LV SV MOD A4C:     122.0 ml LV SV MOD BP:      22.6 ml RIGHT VENTRICLE            IVC RV Basal diam:  5.00 cm    IVC diam: 4.10 cm RV S prime:     9.03 cm/s TAPSE (M-mode): 1.3 cm LEFT ATRIUM             Index        RIGHT ATRIUM           Index LA diam:        4.90 cm 2.42 cm/m   RA Area:     31.90 cm LA Vol (A2C):   78.9 ml 39.03 ml/m  RA Volume:   124.00 ml 61.34 ml/m LA Vol (A4C):   80.7 ml 39.92 ml/m LA Biplane Vol: 82.2 ml 40.67 ml/m  AORTIC VALVE                     PULMONIC VALVE AV Area (Vmax):    1.47 cm      PV Vmax:       0.73 m/s AV Area (Vmean):   1.15 cm  PV Peak grad:  2.1 mmHg AV Area (VTI):     1.60 cm AV Vmax:           131.00 cm/s AV Vmean:          101.000 cm/s AV VTI:             0.181 m AV Peak Grad:      6.9 mmHg AV Mean Grad:      4.0 mmHg LVOT Vmax:         61.10 cm/s LVOT Vmean:        36.900 cm/s LVOT VTI:          0.092 m LVOT/AV VTI ratio: 0.51  AORTA Ao Root diam: 3.40 cm Ao Asc diam:  3.60 cm TRICUSPID VALVE TR Peak grad:   12.5 mmHg TR Vmax:        177.00 cm/s  SHUNTS Systemic VTI:  0.09 m Systemic Diam: 2.00 cm Morene Brownie Electronically signed by Morene Brownie Signature Date/Time: 05/06/2024/4:43:03 PM    Final    CT HEAD WO CONTRAST ( ) Result Date: 05/06/2024 EXAM: CT HEAD WITHOUT CONTRAST 05/06/2024 09:36:20 AM TECHNIQUE: CT of the head was performed without the administration of intravenous contrast. Automated exposure control, iterative reconstruction, and/or weight based adjustment of the mA/kV was utilized to reduce the radiation dose to as low as reasonably achievable. COMPARISON: MRI brain 08/27/2020. CLINICAL HISTORY: Altered mental status, nontraumatic (Ped 0-17y). FINDINGS: BRAIN AND VENTRICLES: No acute hemorrhage. No evidence of acute infarct. Focal encephalomalacia in the anterior inferior right frontal lobe was similar to prior and may reflect remote infarct versus sequelae of prior trauma. Small remote bilateral cerebellar infarcts. Calcific atherosclerosis. No hydrocephalus. No extra-axial collection. No mass effect or midline shift. ORBITS: No acute abnormality. SINUSES: No acute abnormality. SOFT TISSUES AND SKULL: No acute soft tissue abnormality. No skull fracture. IMPRESSION: 1. No acute intracranial abnormality. 2. Focal encephalomalacia in the anterior inferior right frontal lobe, similar to prior MRI, which may reflect remote infarct versus sequelae of prior trauma. 3. Small remote bilateral cerebellar infarcts. Electronically signed by: Donnice Mania MD 05/06/2024 09:59 AM EST RP Workstation: [id number]   DG Chest Portable 1 View Result Date: 05/06/2024 EXAM: 1 VIEW(S) XRAY OF THE CHEST 05/06/2024 01:09:00 AM COMPARISON: 05/05/2024  CLINICAL HISTORY: post central line attempt FINDINGS: LINES, TUBES AND DEVICES: Cardiac paddles overlie chest. Left chest AICD/pacemaker noted. LUNGS AND PLEURA: No focal pulmonary opacity. No pleural effusion. No pneumothorax. HEART AND MEDIASTINUM: Left chest AICD/pacemaker noted. Persistent cardiomegaly. No acute abnormality of the mediastinal silhouette. BONES AND SOFT TISSUES: No acute osseous abnormality. IMPRESSION: 1. No acute cardiopulmonary process. 2. Persistent cardiomegaly. Electronically signed by: Greig Pique MD 05/06/2024 01:18 AM EST RP Workstation: [id number]   DG Chest Portable 1 View Result Date: 05/05/2024 EXAM: 1 VIEW(S) XRAY OF THE CHEST 05/05/2024 11:21:00 PM COMPARISON: 03/14/2024 CLINICAL HISTORY: SOB FINDINGS: LINES, TUBES AND DEVICES: Overlying defibrillator pads. Left chest AICD/Pacemaker with 2 leads overlying the heart. LUNGS AND PLEURA: No focal pulmonary opacity. No pleural effusion. No pneumothorax. HEART AND MEDIASTINUM: Cardiomegaly. BONES AND SOFT TISSUES: No acute osseous abnormality. IMPRESSION: 1. No acute findings. 2. Cardiomegaly with left chest AICD/pacemaker and 2 leads. Electronically signed by: Greig Pique MD 05/05/2024 11:53 PM EST RP Workstation: [id number]    Microbiology: Recent Results (from the past 240 hours)  Blood Culture (routine x 2)     Status: Abnormal   Collection Time: 05/05/24  1:21 AM   Specimen: BLOOD  Result Value Ref  Range Status   Specimen Description BLOOD SITE NOT SPECIFIED  Final   Special Requests   Final    BOTTLES DRAWN AEROBIC AND ANAEROBIC Blood Culture adequate volume   Culture  Setup Time   Final    GRAM POSITIVE COCCI IN CLUSTERS AEROBIC BOTTLE ONLY CRITICAL RESULT CALLED TO, READ BACK BY AND VERIFIED WITH: PHARMD DOROTHA BARLOW [id number]@ 2312 FH    Culture (A)  Final    STAPHYLOCOCCUS EPIDERMIDIS THE SIGNIFICANCE OF ISOLATING THIS ORGANISM FROM A SINGLE SET OF BLOOD CULTURES WHEN MULTIPLE SETS ARE DRAWN IS UNCERTAIN.  PLEASE NOTIFY THE MICROBIOLOGY DEPARTMENT WITHIN ONE WEEK IF SPECIATION AND SENSITIVITIES ARE REQUIRED. Performed at [facility], [address], [place], [place] [address]    Report Status 05/08/2024 FINAL  Final  Blood Culture ID Panel (Reflexed)     Status: Abnormal   Collection Time: 05/05/24  1:21 AM  Result Value Ref Range Status   Enterococcus faecalis NOT DETECTED NOT DETECTED Final   Enterococcus Faecium NOT DETECTED NOT DETECTED Final   Listeria monocytogenes NOT DETECTED NOT DETECTED Final   Staphylococcus species DETECTED (A) NOT DETECTED Final    Comment: CRITICAL RESULT CALLED TO, READ BACK BY AND VERIFIED WITH: MAYA DOROTHA BARLOW [id number]@ 2312 FH    Staphylococcus aureus (BCID) NOT DETECTED NOT DETECTED Final   Staphylococcus epidermidis DETECTED (A) NOT DETECTED Final    Comment: Methicillin (oxacillin) resistant coagulase negative staphylococcus. Possible blood culture contaminant (unless isolated from more than one blood culture draw or clinical case suggests pathogenicity). No antibiotic treatment is indicated for blood  culture contaminants. CRITICAL RESULT CALLED TO, READ BACK BY AND VERIFIED WITH: MAYA DOROTHA BARLOW [id number]@ 2312 FH    Staphylococcus lugdunensis NOT DETECTED NOT DETECTED Final   Streptococcus species NOT DETECTED NOT DETECTED Final   Streptococcus agalactiae NOT DETECTED NOT DETECTED Final   Streptococcus pneumoniae NOT DETECTED NOT DETECTED Final   Streptococcus pyogenes NOT DETECTED NOT DETECTED Final   A.calcoaceticus-baumannii NOT DETECTED NOT DETECTED Final   Bacteroides fragilis NOT DETECTED NOT DETECTED Final   Enterobacterales NOT DETECTED NOT DETECTED Final   Enterobacter cloacae complex NOT DETECTED NOT DETECTED Final   Escherichia coli NOT DETECTED NOT DETECTED Final   Klebsiella aerogenes NOT DETECTED NOT DETECTED Final   Klebsiella oxytoca NOT DETECTED NOT DETECTED Final   Klebsiella pneumoniae NOT DETECTED NOT DETECTED Final    Proteus species NOT DETECTED NOT DETECTED Final   Salmonella species NOT DETECTED NOT DETECTED Final   Serratia marcescens NOT DETECTED NOT DETECTED Final   Haemophilus influenzae NOT DETECTED NOT DETECTED Final   Neisseria meningitidis NOT DETECTED NOT DETECTED Final   Pseudomonas aeruginosa NOT DETECTED NOT DETECTED Final   Stenotrophomonas maltophilia NOT DETECTED NOT DETECTED Final   Candida albicans NOT DETECTED NOT DETECTED Final   Candida auris NOT DETECTED NOT DETECTED Final   Candida glabrata NOT DETECTED NOT DETECTED Final   Candida krusei NOT DETECTED NOT DETECTED Final   Candida parapsilosis NOT DETECTED NOT DETECTED Final   Candida tropicalis NOT DETECTED NOT DETECTED Final   Cryptococcus neoformans/gattii NOT DETECTED NOT DETECTED Final   Methicillin resistance mecA/C DETECTED (A) NOT DETECTED Final    Comment: CRITICAL RESULT CALLED TO, READ BACK BY AND VERIFIED WITH: MAYA DOROTHA BARLOW [id number]@ 2312 FH Performed at [facility], [address] [place], [place] [address]   MRSA Next Gen by PCR, Nasal     Status: None   Collection Time: 05/06/24  2:18  AM   Specimen: Nasal Mucosa; Nasal Swab  Result Value Ref Range Status   MRSA by PCR Next Gen NOT DETECTED NOT DETECTED Final    Comment: (NOTE) The GeneXpert MRSA Assay (FDA approved for NASAL specimens only), is one component of a comprehensive MRSA colonization surveillance program. It is not intended to diagnose MRSA infection nor to guide or monitor treatment for MRSA infections. Test performance is not FDA approved in patients less than 71 years old. Performed at [facility], [address] [place], [place] [address]   Blood Culture (routine x 2)     Status: None   Collection Time: 05/06/24 11:01 AM   Specimen: BLOOD  Result Value Ref Range Status   Specimen Description BLOOD BLOOD LEFT HAND  Final   Special Requests   Final    AEROBIC BOTTLE ONLY Blood Culture results may not be optimal due to an  inadequate volume of blood received in culture bottles   Culture   Final    NO GROWTH 5 DAYS Performed at [facility], [address], [place], [place] [address]    Report Status May 13, 2024 FINAL  Final  Urine Culture     Status: None   Collection Time: 05/06/24  4:44 PM   Specimen: Urine, Random  Result Value Ref Range Status   Specimen Description URINE, RANDOM  Final   Special Requests NONE Reflexed from [id number]  Final   Culture   Final    NO GROWTH Performed at [facility], [address], [place], [place] [address]    Report Status 05/07/2024 FINAL  Final    Time spent: 32 minutes  Signed: Zola LOISE Herter, MD May 13, 2024   "

## 2024-06-05 NOTE — Progress Notes (Signed)
 Pharmacy Electrolyte Replacement  Recent Labs:  Recent Labs    2024-06-04 0511  K 4.3  MG 2.2  PHOS 2.0*  CREATININE 1.67*    Low Critical Values (K </= 2.5, Phos </= 1, Mg </= 1) Present: None  Plan: Replete phosphorous with NaPhos 30 mmol x1. Continue to monitor Na and phos.   Izetta Carl, PharmD PGY1 Pharmacy Resident

## 2024-06-05 NOTE — Progress Notes (Addendum)
 PHARMACY - ANTICOAGULATION CONSULT NOTE  Pharmacy Consult:  Heparin  Indication:  Afib, new LV thrombus, new L DVT  Allergies[1]  Patient Measurements: Weight: 85.9 kg (189 lb 6 oz) Heparin  dosing weight = 83 kg  Vital Signs: Temp: 98.6 F (37 C) 06-03-2024 0800) Temp Source: Axillary 06/03/24 0800) Pulse Rate: 107 06-03-24 1000)  Labs: Recent Labs    05/09/24 0308 05/09/24 1105 05/09/24 1623 05/09/24 1726 05/10/24 0048 05/10/24 0306 05/10/24 0604 05/10/24 1009 05/10/24 1600 05/10/24 1651 05/10/24 2030 05/10/24 2257 Jun 03, 2024 0511 06-03-2024 1010  HGB 8.5*   < > 8.1*   < >  --  7.6*  --  7.6*  --    < >  --  7.7* 7.6* 7.5*  HCT 26.0*   < > 25.0*   < >  --  22.7*  --  23.1*  --    < >  --  23.6* 23.4* 23.2*  PLT 118*  --  103*  --   --  107*  --   --   --   --   --   --  106*  --   APTT 51*  --   --   --  141*  --  >200*  --   --   --   --   --   --   --   LABPROT 22.4*  --   --   --   --  23.9*  --   --   --   --   --   --  24.1*  --   INR 1.9*  --   --   --   --  2.0*  --   --   --   --   --   --  2.0*  --   HEPARINUNFRC  --   --   --    < > <0.10*  --   --  0.13*  --   --  0.29*  --   --  0.30  CREATININE 1.47*   < > 2.09*  --   --  1.88*  --   --  1.74*  --   --   --  1.67*  --    < > = values in this interval not displayed.    Estimated Creatinine Clearance: 50.2 mL/min (A) (by C-G formula based on SCr of 1.67 mg/dL (H)).   Assessment: 48 YOM presented with lower GIB, AoC renal failure, liver cirrhosis and RV failure.  Patient was on Eliquis  for history of Afib and reversed with KCentra  and Vitamin K  on 1/2. GIB ruled out, Afib returned, new LV thrombus and new L lower leg DVT, so Pharmacy consulted to manage IV heparin .   Heparin  level therapeutic on the low end of the goal range (0.30) on infusion at 1400 units/hr. The level had to be recollected ~10 am due to the heparin  level draw being misplaced, which is ~4 hours past the recommended 8-hr follow up. I suspect the  actual heparin  level is higher than reported. Will continue same heparin  dose. Hgb (7.6), PLT (106) low but stable. No issues with line or bleeding on talking to RN.   Patient will be transitioning to comfort care later this afternoon. Will maintain heparin  infusion at current rate until then.   Goal of Therapy:  Heparin  level 0.3-0.5 units/ml Monitor platelets by anticoagulation protocol: Yes   Plan:  Continue heparin  1400 units/hr until transition to comfort care Monitor CBC and s/sx of bleeding  Izetta Carl, PharmD PGY1 Pharmacy Resident           [1] No Known Allergies

## 2024-06-05 DEATH — deceased
